# Patient Record
Sex: Female | Born: 1940 | Race: White | Hispanic: No | Smoking: Former smoker
Health system: Southern US, Community
[De-identification: ages and names within clinical notes are randomized; demographics above are authoritative.]

## PROBLEM LIST (undated history)

## (undated) DIAGNOSIS — K589 Irritable bowel syndrome without diarrhea: Secondary | ICD-10-CM

## (undated) DIAGNOSIS — M4722 Other spondylosis with radiculopathy, cervical region: Secondary | ICD-10-CM

## (undated) DIAGNOSIS — K649 Unspecified hemorrhoids: Secondary | ICD-10-CM

## (undated) DIAGNOSIS — A498 Other bacterial infections of unspecified site: Secondary | ICD-10-CM

## (undated) DIAGNOSIS — K5790 Diverticulosis of intestine, part unspecified, without perforation or abscess without bleeding: Secondary | ICD-10-CM

## (undated) DIAGNOSIS — K4020 Bilateral inguinal hernia, without obstruction or gangrene, not specified as recurrent: Secondary | ICD-10-CM

## (undated) DIAGNOSIS — S060XAA Concussion with loss of consciousness status unknown, initial encounter: Secondary | ICD-10-CM

## (undated) DIAGNOSIS — N2581 Secondary hyperparathyroidism of renal origin: Secondary | ICD-10-CM

## (undated) DIAGNOSIS — R053 Chronic cough: Secondary | ICD-10-CM

## (undated) DIAGNOSIS — Z8619 Personal history of other infectious and parasitic diseases: Secondary | ICD-10-CM

## (undated) DIAGNOSIS — I77810 Thoracic aortic ectasia: Secondary | ICD-10-CM

## (undated) DIAGNOSIS — E861 Hypovolemia: Secondary | ICD-10-CM

## (undated) DIAGNOSIS — Z860100 Personal history of colon polyps, unspecified: Secondary | ICD-10-CM

## (undated) DIAGNOSIS — I700 Atherosclerosis of aorta: Secondary | ICD-10-CM

## (undated) DIAGNOSIS — F329 Major depressive disorder, single episode, unspecified: Secondary | ICD-10-CM

## (undated) DIAGNOSIS — M1990 Unspecified osteoarthritis, unspecified site: Secondary | ICD-10-CM

## (undated) DIAGNOSIS — R0602 Shortness of breath: Secondary | ICD-10-CM

## (undated) DIAGNOSIS — L659 Nonscarring hair loss, unspecified: Secondary | ICD-10-CM

## (undated) DIAGNOSIS — R202 Paresthesia of skin: Secondary | ICD-10-CM

## (undated) DIAGNOSIS — J189 Pneumonia, unspecified organism: Secondary | ICD-10-CM

## (undated) DIAGNOSIS — F32A Depression, unspecified: Secondary | ICD-10-CM

## (undated) DIAGNOSIS — D509 Iron deficiency anemia, unspecified: Secondary | ICD-10-CM

## (undated) DIAGNOSIS — M8580 Other specified disorders of bone density and structure, unspecified site: Secondary | ICD-10-CM

## (undated) DIAGNOSIS — K635 Polyp of colon: Secondary | ICD-10-CM

## (undated) DIAGNOSIS — G5603 Carpal tunnel syndrome, bilateral upper limbs: Principal | ICD-10-CM

## (undated) DIAGNOSIS — I4891 Unspecified atrial fibrillation: Secondary | ICD-10-CM

## (undated) DIAGNOSIS — N183 Chronic kidney disease, stage 3 unspecified: Secondary | ICD-10-CM

## (undated) DIAGNOSIS — R05 Cough: Secondary | ICD-10-CM

## (undated) DIAGNOSIS — I719 Aortic aneurysm of unspecified site, without rupture: Secondary | ICD-10-CM

## (undated) DIAGNOSIS — E7800 Pure hypercholesterolemia, unspecified: Secondary | ICD-10-CM

## (undated) DIAGNOSIS — Z9841 Cataract extraction status, right eye: Secondary | ICD-10-CM

## (undated) DIAGNOSIS — I2510 Atherosclerotic heart disease of native coronary artery without angina pectoris: Secondary | ICD-10-CM

## (undated) DIAGNOSIS — I779 Disorder of arteries and arterioles, unspecified: Secondary | ICD-10-CM

## (undated) DIAGNOSIS — K219 Gastro-esophageal reflux disease without esophagitis: Secondary | ICD-10-CM

## (undated) DIAGNOSIS — J40 Bronchitis, not specified as acute or chronic: Secondary | ICD-10-CM

## (undated) DIAGNOSIS — I10 Essential (primary) hypertension: Secondary | ICD-10-CM

## (undated) DIAGNOSIS — Q2112 Patent foramen ovale: Secondary | ICD-10-CM

## (undated) DIAGNOSIS — H409 Unspecified glaucoma: Secondary | ICD-10-CM

## (undated) DIAGNOSIS — R32 Unspecified urinary incontinence: Secondary | ICD-10-CM

## (undated) DIAGNOSIS — G709 Myoneural disorder, unspecified: Secondary | ICD-10-CM

## (undated) DIAGNOSIS — D126 Benign neoplasm of colon, unspecified: Secondary | ICD-10-CM

## (undated) DIAGNOSIS — S060X9A Concussion with loss of consciousness of unspecified duration, initial encounter: Secondary | ICD-10-CM

## (undated) DIAGNOSIS — M109 Gout, unspecified: Secondary | ICD-10-CM

## (undated) DIAGNOSIS — G4730 Sleep apnea, unspecified: Secondary | ICD-10-CM

## (undated) DIAGNOSIS — Z7901 Long term (current) use of anticoagulants: Secondary | ICD-10-CM

## (undated) DIAGNOSIS — R002 Palpitations: Secondary | ICD-10-CM

## (undated) DIAGNOSIS — D649 Anemia, unspecified: Secondary | ICD-10-CM

## (undated) HISTORY — PX: COSMETIC SURGERY: SHX468

## (undated) HISTORY — DX: Diverticulosis of intestine, part unspecified, without perforation or abscess without bleeding: K57.90

## (undated) HISTORY — DX: Essential (primary) hypertension: I10

## (undated) HISTORY — PX: ROTATOR CUFF REPAIR: SHX139

## (undated) HISTORY — DX: Polyp of colon: K63.5

## (undated) HISTORY — PX: BACK SURGERY: SHX140

## (undated) HISTORY — DX: Pure hypercholesterolemia, unspecified: E78.00

## (undated) HISTORY — PX: SPINE SURGERY: SHX786

## (undated) HISTORY — PX: TOTAL KNEE ARTHROPLASTY: SHX125

## (undated) HISTORY — PX: BREAST REDUCTION SURGERY: SHX8

## (undated) HISTORY — PX: TONSILLECTOMY: SUR1361

## (undated) HISTORY — PX: EYE SURGERY: SHX253

## (undated) HISTORY — PX: LAMINECTOMY: SHX219

## (undated) HISTORY — PX: COLONOSCOPY: SHX174

## (undated) HISTORY — DX: Carpal tunnel syndrome, bilateral upper limbs: G56.03

## (undated) HISTORY — PX: JOINT REPLACEMENT: SHX530

## (undated) HISTORY — DX: Unspecified osteoarthritis, unspecified site: M19.90

## (undated) HISTORY — DX: Unspecified urinary incontinence: R32

## (undated) HISTORY — DX: Major depressive disorder, single episode, unspecified: F32.9

## (undated) HISTORY — DX: Depression, unspecified: F32.A

## (undated) HISTORY — DX: Hypovolemia: E86.1

## (undated) HISTORY — PX: OTHER SURGICAL HISTORY: SHX169

---

## 1977-09-07 HISTORY — PX: ABDOMINAL HYSTERECTOMY: SHX81

## 1979-09-08 HISTORY — PX: BLADDER SURGERY: SHX569

## 1994-09-07 HISTORY — PX: CHOLECYSTECTOMY: SHX55

## 1997-03-07 HISTORY — PX: SPINAL CORD STIMULATOR IMPLANT: SHX2422

## 1999-09-08 HISTORY — PX: REDUCTION MAMMAPLASTY: SUR839

## 2003-09-08 HISTORY — PX: FOOT SURGERY: SHX648

## 2004-09-12 ENCOUNTER — Ambulatory Visit: Payer: Self-pay | Admitting: Internal Medicine

## 2005-01-07 ENCOUNTER — Ambulatory Visit: Payer: Self-pay | Admitting: Internal Medicine

## 2005-03-27 ENCOUNTER — Ambulatory Visit: Payer: Self-pay | Admitting: Unknown Physician Specialty

## 2005-06-12 ENCOUNTER — Ambulatory Visit: Payer: Self-pay | Admitting: Unknown Physician Specialty

## 2005-09-07 HISTORY — PX: SPINAL CORD STIMULATOR REMOVAL: SHX2423

## 2005-09-07 HISTORY — PX: CARPAL TUNNEL RELEASE: SHX101

## 2005-11-04 ENCOUNTER — Ambulatory Visit: Payer: Self-pay | Admitting: Pain Medicine

## 2005-11-18 ENCOUNTER — Ambulatory Visit: Payer: Self-pay | Admitting: Orthopaedic Surgery

## 2005-11-23 ENCOUNTER — Ambulatory Visit: Payer: Self-pay | Admitting: Pain Medicine

## 2006-01-05 ENCOUNTER — Ambulatory Visit: Payer: Self-pay | Admitting: Physician Assistant

## 2006-01-08 ENCOUNTER — Ambulatory Visit: Payer: Self-pay | Admitting: Internal Medicine

## 2006-01-12 ENCOUNTER — Ambulatory Visit: Payer: Self-pay | Admitting: Pain Medicine

## 2006-01-25 ENCOUNTER — Ambulatory Visit: Payer: Self-pay | Admitting: Pain Medicine

## 2006-02-04 ENCOUNTER — Ambulatory Visit: Payer: Self-pay | Admitting: Pain Medicine

## 2006-02-15 ENCOUNTER — Ambulatory Visit: Payer: Self-pay | Admitting: Pain Medicine

## 2006-07-08 ENCOUNTER — Ambulatory Visit: Payer: Self-pay | Admitting: Internal Medicine

## 2006-09-03 ENCOUNTER — Ambulatory Visit: Payer: Self-pay | Admitting: Internal Medicine

## 2006-12-31 ENCOUNTER — Ambulatory Visit: Payer: Self-pay | Admitting: Orthopaedic Surgery

## 2007-01-06 ENCOUNTER — Ambulatory Visit: Payer: Self-pay | Admitting: Orthopaedic Surgery

## 2007-01-28 ENCOUNTER — Ambulatory Visit: Payer: Self-pay | Admitting: Internal Medicine

## 2007-04-18 ENCOUNTER — Ambulatory Visit: Payer: Self-pay | Admitting: Urology

## 2007-06-09 ENCOUNTER — Emergency Department: Payer: Self-pay | Admitting: Emergency Medicine

## 2007-06-09 ENCOUNTER — Other Ambulatory Visit: Payer: Self-pay

## 2008-01-31 ENCOUNTER — Ambulatory Visit: Payer: Self-pay | Admitting: Internal Medicine

## 2008-08-09 ENCOUNTER — Ambulatory Visit: Payer: Self-pay | Admitting: Orthopedic Surgery

## 2008-08-16 ENCOUNTER — Ambulatory Visit: Payer: Self-pay | Admitting: Internal Medicine

## 2009-02-01 ENCOUNTER — Ambulatory Visit: Payer: Self-pay | Admitting: Internal Medicine

## 2009-02-25 ENCOUNTER — Ambulatory Visit: Payer: Self-pay | Admitting: Orthopedic Surgery

## 2009-02-28 ENCOUNTER — Ambulatory Visit: Payer: Self-pay | Admitting: Orthopedic Surgery

## 2009-04-04 ENCOUNTER — Ambulatory Visit: Payer: Self-pay | Admitting: Unknown Physician Specialty

## 2009-08-07 DIAGNOSIS — E861 Hypovolemia: Secondary | ICD-10-CM

## 2009-08-07 HISTORY — DX: Hypovolemia: E86.1

## 2009-08-24 ENCOUNTER — Inpatient Hospital Stay: Payer: Self-pay | Admitting: Internal Medicine

## 2010-01-05 ENCOUNTER — Ambulatory Visit: Payer: Self-pay | Admitting: Internal Medicine

## 2010-01-31 ENCOUNTER — Inpatient Hospital Stay: Payer: Self-pay | Admitting: Internal Medicine

## 2010-02-05 ENCOUNTER — Ambulatory Visit: Payer: Self-pay | Admitting: Internal Medicine

## 2010-02-18 ENCOUNTER — Ambulatory Visit: Payer: Self-pay | Admitting: Internal Medicine

## 2010-03-05 ENCOUNTER — Ambulatory Visit: Payer: Self-pay | Admitting: Internal Medicine

## 2010-03-07 ENCOUNTER — Ambulatory Visit: Payer: Self-pay | Admitting: Internal Medicine

## 2010-05-08 ENCOUNTER — Ambulatory Visit: Payer: Self-pay | Admitting: Internal Medicine

## 2010-05-16 ENCOUNTER — Ambulatory Visit: Payer: Self-pay | Admitting: Unknown Physician Specialty

## 2010-05-20 ENCOUNTER — Ambulatory Visit: Payer: Self-pay | Admitting: Internal Medicine

## 2010-05-27 ENCOUNTER — Ambulatory Visit: Payer: Self-pay | Admitting: Unknown Physician Specialty

## 2010-06-07 ENCOUNTER — Ambulatory Visit: Payer: Self-pay | Admitting: Internal Medicine

## 2010-07-24 ENCOUNTER — Ambulatory Visit: Payer: Self-pay | Admitting: Internal Medicine

## 2010-12-17 ENCOUNTER — Ambulatory Visit: Payer: Self-pay | Admitting: Internal Medicine

## 2011-01-06 ENCOUNTER — Ambulatory Visit: Payer: Self-pay | Admitting: Internal Medicine

## 2011-02-11 ENCOUNTER — Ambulatory Visit: Payer: Self-pay | Admitting: Internal Medicine

## 2011-04-29 ENCOUNTER — Ambulatory Visit: Payer: Self-pay | Admitting: Unknown Physician Specialty

## 2011-05-04 LAB — PATHOLOGY REPORT

## 2011-05-22 ENCOUNTER — Ambulatory Visit: Payer: Self-pay | Admitting: Internal Medicine

## 2011-08-17 ENCOUNTER — Ambulatory Visit: Payer: Self-pay | Admitting: Orthopedic Surgery

## 2011-09-28 ENCOUNTER — Ambulatory Visit: Payer: Self-pay | Admitting: Orthopedic Surgery

## 2011-09-28 DIAGNOSIS — Z9889 Other specified postprocedural states: Secondary | ICD-10-CM | POA: Diagnosis not present

## 2011-09-28 DIAGNOSIS — Z90710 Acquired absence of both cervix and uterus: Secondary | ICD-10-CM | POA: Diagnosis not present

## 2011-09-28 DIAGNOSIS — M1710 Unilateral primary osteoarthritis, unspecified knee: Secondary | ICD-10-CM | POA: Diagnosis not present

## 2011-09-28 DIAGNOSIS — F329 Major depressive disorder, single episode, unspecified: Secondary | ICD-10-CM | POA: Diagnosis not present

## 2011-09-28 DIAGNOSIS — E785 Hyperlipidemia, unspecified: Secondary | ICD-10-CM | POA: Diagnosis not present

## 2011-09-28 DIAGNOSIS — Z79899 Other long term (current) drug therapy: Secondary | ICD-10-CM | POA: Diagnosis not present

## 2011-09-28 DIAGNOSIS — Z01812 Encounter for preprocedural laboratory examination: Secondary | ICD-10-CM | POA: Diagnosis not present

## 2011-09-28 DIAGNOSIS — I10 Essential (primary) hypertension: Secondary | ICD-10-CM | POA: Diagnosis not present

## 2011-09-28 LAB — SEDIMENTATION RATE: Erythrocyte Sed Rate: 22 mm/hr (ref 0–30)

## 2011-09-28 LAB — BASIC METABOLIC PANEL
Creatinine: 0.73 mg/dL (ref 0.60–1.30)
EGFR (African American): >60
EGFR (Non-African Amer.): >60
Glucose: 97 mg/dL (ref 65–99)
Osmolality: 286 (ref 275–301)
Potassium: 4.5 mmol/L (ref 3.5–5.1)
Sodium: 143 mmol/L (ref 136–145)

## 2011-09-28 LAB — APTT: Activated PTT: 34.4 secs (ref 23.6–35.9)

## 2011-09-28 LAB — CBC
HGB: 12 g/dL (ref 12.0–16.0)
MCH: 33 pg (ref 26.0–34.0)
MCHC: 33.8 g/dL (ref 32.0–36.0)
MCV: 98 fL (ref 80–100)
Platelet: 261 10*3/uL (ref 150–440)

## 2011-09-28 LAB — PROTIME-INR
INR: 0.9
Prothrombin Time: 13 secs (ref 11.5–14.7)

## 2011-10-08 ENCOUNTER — Inpatient Hospital Stay: Payer: Self-pay | Admitting: Orthopedic Surgery

## 2011-10-08 DIAGNOSIS — F329 Major depressive disorder, single episode, unspecified: Secondary | ICD-10-CM | POA: Diagnosis not present

## 2011-10-08 DIAGNOSIS — Z90710 Acquired absence of both cervix and uterus: Secondary | ICD-10-CM | POA: Diagnosis not present

## 2011-10-08 DIAGNOSIS — M810 Age-related osteoporosis without current pathological fracture: Secondary | ICD-10-CM | POA: Diagnosis not present

## 2011-10-08 DIAGNOSIS — R6889 Other general symptoms and signs: Secondary | ICD-10-CM | POA: Diagnosis not present

## 2011-10-08 DIAGNOSIS — Z471 Aftercare following joint replacement surgery: Secondary | ICD-10-CM | POA: Diagnosis not present

## 2011-10-08 DIAGNOSIS — M25569 Pain in unspecified knee: Secondary | ICD-10-CM | POA: Diagnosis not present

## 2011-10-08 DIAGNOSIS — Z87891 Personal history of nicotine dependence: Secondary | ICD-10-CM | POA: Diagnosis not present

## 2011-10-08 DIAGNOSIS — I10 Essential (primary) hypertension: Secondary | ICD-10-CM | POA: Diagnosis not present

## 2011-10-08 DIAGNOSIS — Z5189 Encounter for other specified aftercare: Secondary | ICD-10-CM | POA: Diagnosis not present

## 2011-10-08 DIAGNOSIS — Z882 Allergy status to sulfonamides status: Secondary | ICD-10-CM | POA: Diagnosis not present

## 2011-10-08 DIAGNOSIS — Z823 Family history of stroke: Secondary | ICD-10-CM | POA: Diagnosis not present

## 2011-10-08 DIAGNOSIS — K649 Unspecified hemorrhoids: Secondary | ICD-10-CM | POA: Diagnosis present

## 2011-10-08 DIAGNOSIS — Z881 Allergy status to other antibiotic agents status: Secondary | ICD-10-CM | POA: Diagnosis not present

## 2011-10-08 DIAGNOSIS — Z96659 Presence of unspecified artificial knee joint: Secondary | ICD-10-CM | POA: Diagnosis not present

## 2011-10-08 DIAGNOSIS — E7800 Pure hypercholesterolemia, unspecified: Secondary | ICD-10-CM | POA: Diagnosis not present

## 2011-10-08 DIAGNOSIS — M1990 Unspecified osteoarthritis, unspecified site: Secondary | ICD-10-CM | POA: Diagnosis not present

## 2011-10-08 DIAGNOSIS — Z8249 Family history of ischemic heart disease and other diseases of the circulatory system: Secondary | ICD-10-CM | POA: Diagnosis not present

## 2011-10-08 DIAGNOSIS — G8918 Other acute postprocedural pain: Secondary | ICD-10-CM | POA: Diagnosis not present

## 2011-10-08 DIAGNOSIS — R269 Unspecified abnormalities of gait and mobility: Secondary | ICD-10-CM | POA: Diagnosis not present

## 2011-10-08 DIAGNOSIS — M109 Gout, unspecified: Secondary | ICD-10-CM | POA: Diagnosis not present

## 2011-10-08 DIAGNOSIS — M1710 Unilateral primary osteoarthritis, unspecified knee: Secondary | ICD-10-CM | POA: Diagnosis present

## 2011-10-08 DIAGNOSIS — M6281 Muscle weakness (generalized): Secondary | ICD-10-CM | POA: Diagnosis not present

## 2011-10-08 DIAGNOSIS — Z9089 Acquired absence of other organs: Secondary | ICD-10-CM | POA: Diagnosis not present

## 2011-10-08 DIAGNOSIS — Z79899 Other long term (current) drug therapy: Secondary | ICD-10-CM | POA: Diagnosis not present

## 2011-10-09 LAB — BASIC METABOLIC PANEL
Anion Gap: 10 (ref 7–16)
Calcium, Total: 8.4 mg/dL — ABNORMAL LOW (ref 8.5–10.1)
Co2: 28 mmol/L (ref 21–32)
EGFR (African American): >60
Glucose: 103 mg/dL — ABNORMAL HIGH (ref 65–99)
Osmolality: 278 (ref 275–301)
Sodium: 139 mmol/L (ref 136–145)

## 2011-10-09 LAB — HEMOGLOBIN: HGB: 9.6 g/dL — ABNORMAL LOW (ref 12.0–16.0)

## 2011-10-10 LAB — HEMOGLOBIN: HGB: 9 g/dL — ABNORMAL LOW (ref 12.0–16.0)

## 2011-10-12 DIAGNOSIS — Z96659 Presence of unspecified artificial knee joint: Secondary | ICD-10-CM | POA: Diagnosis not present

## 2011-10-12 DIAGNOSIS — M109 Gout, unspecified: Secondary | ICD-10-CM | POA: Diagnosis not present

## 2011-10-12 DIAGNOSIS — F329 Major depressive disorder, single episode, unspecified: Secondary | ICD-10-CM | POA: Diagnosis not present

## 2011-10-12 DIAGNOSIS — M6281 Muscle weakness (generalized): Secondary | ICD-10-CM | POA: Diagnosis not present

## 2011-10-12 DIAGNOSIS — Z5189 Encounter for other specified aftercare: Secondary | ICD-10-CM | POA: Diagnosis not present

## 2011-10-12 DIAGNOSIS — M810 Age-related osteoporosis without current pathological fracture: Secondary | ICD-10-CM | POA: Diagnosis not present

## 2011-10-12 DIAGNOSIS — Z471 Aftercare following joint replacement surgery: Secondary | ICD-10-CM | POA: Diagnosis not present

## 2011-10-12 DIAGNOSIS — E7800 Pure hypercholesterolemia, unspecified: Secondary | ICD-10-CM | POA: Diagnosis not present

## 2011-10-12 DIAGNOSIS — R269 Unspecified abnormalities of gait and mobility: Secondary | ICD-10-CM | POA: Diagnosis not present

## 2011-10-12 DIAGNOSIS — I10 Essential (primary) hypertension: Secondary | ICD-10-CM | POA: Diagnosis not present

## 2011-10-12 DIAGNOSIS — R6889 Other general symptoms and signs: Secondary | ICD-10-CM | POA: Diagnosis not present

## 2011-10-13 ENCOUNTER — Encounter: Payer: Self-pay | Admitting: Internal Medicine

## 2011-10-29 DIAGNOSIS — Z471 Aftercare following joint replacement surgery: Secondary | ICD-10-CM | POA: Diagnosis not present

## 2011-10-29 DIAGNOSIS — Z96659 Presence of unspecified artificial knee joint: Secondary | ICD-10-CM | POA: Diagnosis not present

## 2011-10-29 DIAGNOSIS — I10 Essential (primary) hypertension: Secondary | ICD-10-CM | POA: Diagnosis not present

## 2011-10-29 DIAGNOSIS — Z5189 Encounter for other specified aftercare: Secondary | ICD-10-CM | POA: Diagnosis not present

## 2011-10-29 DIAGNOSIS — F3289 Other specified depressive episodes: Secondary | ICD-10-CM | POA: Diagnosis not present

## 2011-11-06 ENCOUNTER — Encounter: Payer: Self-pay | Admitting: Internal Medicine

## 2011-11-08 DIAGNOSIS — I10 Essential (primary) hypertension: Secondary | ICD-10-CM | POA: Diagnosis not present

## 2011-11-08 DIAGNOSIS — Z5189 Encounter for other specified aftercare: Secondary | ICD-10-CM | POA: Diagnosis not present

## 2011-11-18 DIAGNOSIS — Z96659 Presence of unspecified artificial knee joint: Secondary | ICD-10-CM | POA: Diagnosis not present

## 2011-11-20 DIAGNOSIS — I10 Essential (primary) hypertension: Secondary | ICD-10-CM | POA: Diagnosis not present

## 2011-11-20 DIAGNOSIS — E7800 Pure hypercholesterolemia, unspecified: Secondary | ICD-10-CM | POA: Diagnosis not present

## 2011-11-20 DIAGNOSIS — Z79899 Other long term (current) drug therapy: Secondary | ICD-10-CM | POA: Diagnosis not present

## 2011-11-23 DIAGNOSIS — M25669 Stiffness of unspecified knee, not elsewhere classified: Secondary | ICD-10-CM | POA: Diagnosis not present

## 2011-11-23 DIAGNOSIS — M6281 Muscle weakness (generalized): Secondary | ICD-10-CM | POA: Diagnosis not present

## 2011-11-23 DIAGNOSIS — Z96659 Presence of unspecified artificial knee joint: Secondary | ICD-10-CM | POA: Diagnosis not present

## 2011-11-23 DIAGNOSIS — M25569 Pain in unspecified knee: Secondary | ICD-10-CM | POA: Diagnosis not present

## 2011-11-24 DIAGNOSIS — M25569 Pain in unspecified knee: Secondary | ICD-10-CM | POA: Diagnosis not present

## 2011-11-24 DIAGNOSIS — M25669 Stiffness of unspecified knee, not elsewhere classified: Secondary | ICD-10-CM | POA: Diagnosis not present

## 2011-11-24 DIAGNOSIS — M6281 Muscle weakness (generalized): Secondary | ICD-10-CM | POA: Diagnosis not present

## 2011-11-24 DIAGNOSIS — Z96659 Presence of unspecified artificial knee joint: Secondary | ICD-10-CM | POA: Diagnosis not present

## 2011-11-27 DIAGNOSIS — Z96659 Presence of unspecified artificial knee joint: Secondary | ICD-10-CM | POA: Diagnosis not present

## 2011-11-27 DIAGNOSIS — M25569 Pain in unspecified knee: Secondary | ICD-10-CM | POA: Diagnosis not present

## 2011-11-27 DIAGNOSIS — M25669 Stiffness of unspecified knee, not elsewhere classified: Secondary | ICD-10-CM | POA: Diagnosis not present

## 2011-11-27 DIAGNOSIS — M6281 Muscle weakness (generalized): Secondary | ICD-10-CM | POA: Diagnosis not present

## 2011-11-30 DIAGNOSIS — M6281 Muscle weakness (generalized): Secondary | ICD-10-CM | POA: Diagnosis not present

## 2011-11-30 DIAGNOSIS — M25669 Stiffness of unspecified knee, not elsewhere classified: Secondary | ICD-10-CM | POA: Diagnosis not present

## 2011-11-30 DIAGNOSIS — M25569 Pain in unspecified knee: Secondary | ICD-10-CM | POA: Diagnosis not present

## 2011-11-30 DIAGNOSIS — Z96659 Presence of unspecified artificial knee joint: Secondary | ICD-10-CM | POA: Diagnosis not present

## 2011-12-01 DIAGNOSIS — E7800 Pure hypercholesterolemia, unspecified: Secondary | ICD-10-CM | POA: Diagnosis not present

## 2011-12-01 DIAGNOSIS — I10 Essential (primary) hypertension: Secondary | ICD-10-CM | POA: Diagnosis not present

## 2011-12-02 DIAGNOSIS — M25569 Pain in unspecified knee: Secondary | ICD-10-CM | POA: Diagnosis not present

## 2011-12-02 DIAGNOSIS — Z96659 Presence of unspecified artificial knee joint: Secondary | ICD-10-CM | POA: Diagnosis not present

## 2011-12-02 DIAGNOSIS — M6281 Muscle weakness (generalized): Secondary | ICD-10-CM | POA: Diagnosis not present

## 2011-12-02 DIAGNOSIS — M25669 Stiffness of unspecified knee, not elsewhere classified: Secondary | ICD-10-CM | POA: Diagnosis not present

## 2011-12-03 DIAGNOSIS — M25569 Pain in unspecified knee: Secondary | ICD-10-CM | POA: Diagnosis not present

## 2011-12-03 DIAGNOSIS — Z96659 Presence of unspecified artificial knee joint: Secondary | ICD-10-CM | POA: Diagnosis not present

## 2011-12-03 DIAGNOSIS — M25669 Stiffness of unspecified knee, not elsewhere classified: Secondary | ICD-10-CM | POA: Diagnosis not present

## 2011-12-03 DIAGNOSIS — M6281 Muscle weakness (generalized): Secondary | ICD-10-CM | POA: Diagnosis not present

## 2011-12-07 ENCOUNTER — Encounter: Payer: Self-pay | Admitting: Internal Medicine

## 2011-12-14 DIAGNOSIS — M25669 Stiffness of unspecified knee, not elsewhere classified: Secondary | ICD-10-CM | POA: Diagnosis not present

## 2011-12-14 DIAGNOSIS — M25569 Pain in unspecified knee: Secondary | ICD-10-CM | POA: Diagnosis not present

## 2011-12-14 DIAGNOSIS — M6281 Muscle weakness (generalized): Secondary | ICD-10-CM | POA: Diagnosis not present

## 2011-12-14 DIAGNOSIS — Z96659 Presence of unspecified artificial knee joint: Secondary | ICD-10-CM | POA: Diagnosis not present

## 2011-12-16 DIAGNOSIS — M25569 Pain in unspecified knee: Secondary | ICD-10-CM | POA: Diagnosis not present

## 2011-12-16 DIAGNOSIS — M6281 Muscle weakness (generalized): Secondary | ICD-10-CM | POA: Diagnosis not present

## 2011-12-16 DIAGNOSIS — Z96659 Presence of unspecified artificial knee joint: Secondary | ICD-10-CM | POA: Diagnosis not present

## 2011-12-16 DIAGNOSIS — M25669 Stiffness of unspecified knee, not elsewhere classified: Secondary | ICD-10-CM | POA: Diagnosis not present

## 2011-12-18 DIAGNOSIS — M25569 Pain in unspecified knee: Secondary | ICD-10-CM | POA: Diagnosis not present

## 2011-12-18 DIAGNOSIS — M6281 Muscle weakness (generalized): Secondary | ICD-10-CM | POA: Diagnosis not present

## 2011-12-18 DIAGNOSIS — Z96659 Presence of unspecified artificial knee joint: Secondary | ICD-10-CM | POA: Diagnosis not present

## 2011-12-18 DIAGNOSIS — M25669 Stiffness of unspecified knee, not elsewhere classified: Secondary | ICD-10-CM | POA: Diagnosis not present

## 2011-12-21 DIAGNOSIS — M25569 Pain in unspecified knee: Secondary | ICD-10-CM | POA: Diagnosis not present

## 2011-12-21 DIAGNOSIS — M6281 Muscle weakness (generalized): Secondary | ICD-10-CM | POA: Diagnosis not present

## 2011-12-21 DIAGNOSIS — M25669 Stiffness of unspecified knee, not elsewhere classified: Secondary | ICD-10-CM | POA: Diagnosis not present

## 2011-12-21 DIAGNOSIS — Z96659 Presence of unspecified artificial knee joint: Secondary | ICD-10-CM | POA: Diagnosis not present

## 2011-12-23 DIAGNOSIS — M6281 Muscle weakness (generalized): Secondary | ICD-10-CM | POA: Diagnosis not present

## 2011-12-23 DIAGNOSIS — M25669 Stiffness of unspecified knee, not elsewhere classified: Secondary | ICD-10-CM | POA: Diagnosis not present

## 2011-12-23 DIAGNOSIS — Z96659 Presence of unspecified artificial knee joint: Secondary | ICD-10-CM | POA: Diagnosis not present

## 2011-12-23 DIAGNOSIS — M25569 Pain in unspecified knee: Secondary | ICD-10-CM | POA: Diagnosis not present

## 2011-12-28 DIAGNOSIS — M25669 Stiffness of unspecified knee, not elsewhere classified: Secondary | ICD-10-CM | POA: Diagnosis not present

## 2011-12-28 DIAGNOSIS — M25569 Pain in unspecified knee: Secondary | ICD-10-CM | POA: Diagnosis not present

## 2011-12-28 DIAGNOSIS — Z96659 Presence of unspecified artificial knee joint: Secondary | ICD-10-CM | POA: Diagnosis not present

## 2011-12-30 DIAGNOSIS — M6281 Muscle weakness (generalized): Secondary | ICD-10-CM | POA: Diagnosis not present

## 2011-12-30 DIAGNOSIS — M25569 Pain in unspecified knee: Secondary | ICD-10-CM | POA: Diagnosis not present

## 2011-12-30 DIAGNOSIS — Z96659 Presence of unspecified artificial knee joint: Secondary | ICD-10-CM | POA: Diagnosis not present

## 2011-12-30 DIAGNOSIS — M25669 Stiffness of unspecified knee, not elsewhere classified: Secondary | ICD-10-CM | POA: Diagnosis not present

## 2012-01-01 DIAGNOSIS — M25569 Pain in unspecified knee: Secondary | ICD-10-CM | POA: Diagnosis not present

## 2012-01-01 DIAGNOSIS — Z96659 Presence of unspecified artificial knee joint: Secondary | ICD-10-CM | POA: Diagnosis not present

## 2012-01-01 DIAGNOSIS — M25669 Stiffness of unspecified knee, not elsewhere classified: Secondary | ICD-10-CM | POA: Diagnosis not present

## 2012-01-01 DIAGNOSIS — M6281 Muscle weakness (generalized): Secondary | ICD-10-CM | POA: Diagnosis not present

## 2012-01-04 DIAGNOSIS — M6281 Muscle weakness (generalized): Secondary | ICD-10-CM | POA: Diagnosis not present

## 2012-01-04 DIAGNOSIS — M25669 Stiffness of unspecified knee, not elsewhere classified: Secondary | ICD-10-CM | POA: Diagnosis not present

## 2012-01-04 DIAGNOSIS — M25569 Pain in unspecified knee: Secondary | ICD-10-CM | POA: Diagnosis not present

## 2012-01-04 DIAGNOSIS — Z96659 Presence of unspecified artificial knee joint: Secondary | ICD-10-CM | POA: Diagnosis not present

## 2012-01-06 ENCOUNTER — Encounter: Payer: Self-pay | Admitting: Internal Medicine

## 2012-01-13 DIAGNOSIS — M25669 Stiffness of unspecified knee, not elsewhere classified: Secondary | ICD-10-CM | POA: Diagnosis not present

## 2012-01-13 DIAGNOSIS — M25569 Pain in unspecified knee: Secondary | ICD-10-CM | POA: Diagnosis not present

## 2012-01-13 DIAGNOSIS — Z96659 Presence of unspecified artificial knee joint: Secondary | ICD-10-CM | POA: Diagnosis not present

## 2012-01-13 DIAGNOSIS — M6281 Muscle weakness (generalized): Secondary | ICD-10-CM | POA: Diagnosis not present

## 2012-01-19 DIAGNOSIS — M25669 Stiffness of unspecified knee, not elsewhere classified: Secondary | ICD-10-CM | POA: Diagnosis not present

## 2012-01-19 DIAGNOSIS — Z96659 Presence of unspecified artificial knee joint: Secondary | ICD-10-CM | POA: Diagnosis not present

## 2012-01-19 DIAGNOSIS — M25569 Pain in unspecified knee: Secondary | ICD-10-CM | POA: Diagnosis not present

## 2012-01-19 DIAGNOSIS — M6281 Muscle weakness (generalized): Secondary | ICD-10-CM | POA: Diagnosis not present

## 2012-01-21 DIAGNOSIS — Z96659 Presence of unspecified artificial knee joint: Secondary | ICD-10-CM | POA: Diagnosis not present

## 2012-01-21 DIAGNOSIS — M6281 Muscle weakness (generalized): Secondary | ICD-10-CM | POA: Diagnosis not present

## 2012-01-21 DIAGNOSIS — M25669 Stiffness of unspecified knee, not elsewhere classified: Secondary | ICD-10-CM | POA: Diagnosis not present

## 2012-01-21 DIAGNOSIS — M25569 Pain in unspecified knee: Secondary | ICD-10-CM | POA: Diagnosis not present

## 2012-03-15 ENCOUNTER — Ambulatory Visit: Payer: Self-pay | Admitting: Dermatology

## 2012-03-15 DIAGNOSIS — M7989 Other specified soft tissue disorders: Secondary | ICD-10-CM | POA: Diagnosis not present

## 2012-03-17 DIAGNOSIS — L0390 Cellulitis, unspecified: Secondary | ICD-10-CM | POA: Diagnosis not present

## 2012-03-17 DIAGNOSIS — R21 Rash and other nonspecific skin eruption: Secondary | ICD-10-CM | POA: Diagnosis not present

## 2012-03-23 DIAGNOSIS — M1710 Unilateral primary osteoarthritis, unspecified knee: Secondary | ICD-10-CM | POA: Diagnosis not present

## 2012-03-23 DIAGNOSIS — Z96659 Presence of unspecified artificial knee joint: Secondary | ICD-10-CM | POA: Diagnosis not present

## 2012-03-23 DIAGNOSIS — S93419A Sprain of calcaneofibular ligament of unspecified ankle, initial encounter: Secondary | ICD-10-CM | POA: Diagnosis not present

## 2012-04-04 ENCOUNTER — Ambulatory Visit: Payer: Self-pay | Admitting: Orthopedic Surgery

## 2012-04-04 DIAGNOSIS — E7800 Pure hypercholesterolemia, unspecified: Secondary | ICD-10-CM | POA: Diagnosis not present

## 2012-04-04 DIAGNOSIS — M25569 Pain in unspecified knee: Secondary | ICD-10-CM | POA: Diagnosis not present

## 2012-04-04 DIAGNOSIS — Z79899 Other long term (current) drug therapy: Secondary | ICD-10-CM | POA: Diagnosis not present

## 2012-04-04 DIAGNOSIS — I10 Essential (primary) hypertension: Secondary | ICD-10-CM | POA: Diagnosis not present

## 2012-05-11 ENCOUNTER — Ambulatory Visit: Payer: Self-pay | Admitting: Orthopedic Surgery

## 2012-05-11 DIAGNOSIS — Z01810 Encounter for preprocedural cardiovascular examination: Secondary | ICD-10-CM | POA: Diagnosis not present

## 2012-05-11 DIAGNOSIS — E785 Hyperlipidemia, unspecified: Secondary | ICD-10-CM | POA: Diagnosis not present

## 2012-05-11 DIAGNOSIS — M79609 Pain in unspecified limb: Secondary | ICD-10-CM | POA: Diagnosis not present

## 2012-05-11 DIAGNOSIS — IMO0002 Reserved for concepts with insufficient information to code with codable children: Secondary | ICD-10-CM | POA: Diagnosis not present

## 2012-05-11 DIAGNOSIS — K219 Gastro-esophageal reflux disease without esophagitis: Secondary | ICD-10-CM | POA: Diagnosis not present

## 2012-05-11 DIAGNOSIS — Z01812 Encounter for preprocedural laboratory examination: Secondary | ICD-10-CM | POA: Diagnosis not present

## 2012-05-11 DIAGNOSIS — I10 Essential (primary) hypertension: Secondary | ICD-10-CM | POA: Diagnosis not present

## 2012-05-11 LAB — BASIC METABOLIC PANEL
Calcium, Total: 9.6 mg/dL (ref 8.5–10.1)
Co2: 29 mmol/L (ref 21–32)
Creatinine: 0.79 mg/dL (ref 0.60–1.30)
EGFR (African American): >60
Glucose: 87 mg/dL (ref 65–99)
Potassium: 5 mmol/L (ref 3.5–5.1)
Sodium: 138 mmol/L (ref 136–145)

## 2012-05-11 LAB — CBC
MCH: 34.3 pg — ABNORMAL HIGH (ref 26.0–34.0)
Platelet: 277 10*3/uL (ref 150–440)
RBC: 3.42 10*6/uL — ABNORMAL LOW (ref 3.80–5.20)
WBC: 5.8 10*3/uL (ref 3.6–11.0)

## 2012-05-11 LAB — PROTIME-INR: INR: 1

## 2012-05-17 ENCOUNTER — Inpatient Hospital Stay: Payer: Self-pay | Admitting: Orthopedic Surgery

## 2012-05-17 DIAGNOSIS — R918 Other nonspecific abnormal finding of lung field: Secondary | ICD-10-CM | POA: Diagnosis not present

## 2012-05-17 DIAGNOSIS — M6281 Muscle weakness (generalized): Secondary | ICD-10-CM | POA: Diagnosis not present

## 2012-05-17 DIAGNOSIS — J320 Chronic maxillary sinusitis: Secondary | ICD-10-CM | POA: Diagnosis not present

## 2012-05-17 DIAGNOSIS — I10 Essential (primary) hypertension: Secondary | ICD-10-CM | POA: Diagnosis present

## 2012-05-17 DIAGNOSIS — F329 Major depressive disorder, single episode, unspecified: Secondary | ICD-10-CM | POA: Diagnosis present

## 2012-05-17 DIAGNOSIS — Z471 Aftercare following joint replacement surgery: Secondary | ICD-10-CM | POA: Diagnosis not present

## 2012-05-17 DIAGNOSIS — E785 Hyperlipidemia, unspecified: Secondary | ICD-10-CM | POA: Diagnosis present

## 2012-05-17 DIAGNOSIS — R4182 Altered mental status, unspecified: Secondary | ICD-10-CM | POA: Diagnosis not present

## 2012-05-17 DIAGNOSIS — M1710 Unilateral primary osteoarthritis, unspecified knee: Secondary | ICD-10-CM | POA: Diagnosis present

## 2012-05-17 DIAGNOSIS — Z9089 Acquired absence of other organs: Secondary | ICD-10-CM | POA: Diagnosis not present

## 2012-05-17 DIAGNOSIS — Z882 Allergy status to sulfonamides status: Secondary | ICD-10-CM | POA: Diagnosis not present

## 2012-05-17 DIAGNOSIS — F05 Delirium due to known physiological condition: Secondary | ICD-10-CM | POA: Diagnosis not present

## 2012-05-17 DIAGNOSIS — Z01810 Encounter for preprocedural cardiovascular examination: Secondary | ICD-10-CM | POA: Diagnosis not present

## 2012-05-17 DIAGNOSIS — Z90710 Acquired absence of both cervix and uterus: Secondary | ICD-10-CM | POA: Diagnosis not present

## 2012-05-17 DIAGNOSIS — Z96659 Presence of unspecified artificial knee joint: Secondary | ICD-10-CM | POA: Diagnosis not present

## 2012-05-17 DIAGNOSIS — J189 Pneumonia, unspecified organism: Secondary | ICD-10-CM | POA: Diagnosis not present

## 2012-05-17 DIAGNOSIS — R0902 Hypoxemia: Secondary | ICD-10-CM | POA: Diagnosis not present

## 2012-05-17 DIAGNOSIS — R404 Transient alteration of awareness: Secondary | ICD-10-CM | POA: Diagnosis present

## 2012-05-17 DIAGNOSIS — E7800 Pure hypercholesterolemia, unspecified: Secondary | ICD-10-CM | POA: Diagnosis present

## 2012-05-17 DIAGNOSIS — I959 Hypotension, unspecified: Secondary | ICD-10-CM | POA: Diagnosis not present

## 2012-05-17 DIAGNOSIS — T50995A Adverse effect of other drugs, medicaments and biological substances, initial encounter: Secondary | ICD-10-CM | POA: Diagnosis not present

## 2012-05-17 DIAGNOSIS — R269 Unspecified abnormalities of gait and mobility: Secondary | ICD-10-CM | POA: Diagnosis not present

## 2012-05-17 DIAGNOSIS — I9589 Other hypotension: Secondary | ICD-10-CM | POA: Diagnosis not present

## 2012-05-17 DIAGNOSIS — Z87440 Personal history of urinary (tract) infections: Secondary | ICD-10-CM | POA: Diagnosis not present

## 2012-05-17 DIAGNOSIS — M109 Gout, unspecified: Secondary | ICD-10-CM | POA: Diagnosis present

## 2012-05-17 DIAGNOSIS — R6889 Other general symptoms and signs: Secondary | ICD-10-CM | POA: Diagnosis not present

## 2012-05-17 DIAGNOSIS — J9819 Other pulmonary collapse: Secondary | ICD-10-CM | POA: Diagnosis not present

## 2012-05-17 DIAGNOSIS — Z881 Allergy status to other antibiotic agents status: Secondary | ICD-10-CM | POA: Diagnosis not present

## 2012-05-17 DIAGNOSIS — M1909 Primary osteoarthritis, other specified site: Secondary | ICD-10-CM | POA: Diagnosis not present

## 2012-05-17 DIAGNOSIS — D62 Acute posthemorrhagic anemia: Secondary | ICD-10-CM | POA: Diagnosis not present

## 2012-05-17 DIAGNOSIS — IMO0002 Reserved for concepts with insufficient information to code with codable children: Secondary | ICD-10-CM | POA: Diagnosis not present

## 2012-05-17 DIAGNOSIS — Z79899 Other long term (current) drug therapy: Secondary | ICD-10-CM | POA: Diagnosis not present

## 2012-05-17 DIAGNOSIS — Z5189 Encounter for other specified aftercare: Secondary | ICD-10-CM | POA: Diagnosis not present

## 2012-05-18 LAB — HEMOGLOBIN: HGB: 8.8 g/dL — ABNORMAL LOW (ref 12.0–16.0)

## 2012-05-18 LAB — BASIC METABOLIC PANEL
Anion Gap: 9 (ref 7–16)
Creatinine: 0.73 mg/dL (ref 0.60–1.30)
EGFR (African American): >60
Osmolality: 276 (ref 275–301)

## 2012-05-18 LAB — PLATELET COUNT: Platelet: 184 10*3/uL (ref 150–440)

## 2012-05-19 DIAGNOSIS — T50995A Adverse effect of other drugs, medicaments and biological substances, initial encounter: Secondary | ICD-10-CM | POA: Diagnosis not present

## 2012-05-19 DIAGNOSIS — Z01810 Encounter for preprocedural cardiovascular examination: Secondary | ICD-10-CM | POA: Diagnosis not present

## 2012-05-19 DIAGNOSIS — J189 Pneumonia, unspecified organism: Secondary | ICD-10-CM | POA: Diagnosis not present

## 2012-05-19 DIAGNOSIS — Z96659 Presence of unspecified artificial knee joint: Secondary | ICD-10-CM | POA: Diagnosis not present

## 2012-05-19 DIAGNOSIS — R0902 Hypoxemia: Secondary | ICD-10-CM | POA: Diagnosis not present

## 2012-05-19 DIAGNOSIS — R4182 Altered mental status, unspecified: Secondary | ICD-10-CM | POA: Diagnosis not present

## 2012-05-19 DIAGNOSIS — J320 Chronic maxillary sinusitis: Secondary | ICD-10-CM | POA: Diagnosis not present

## 2012-05-19 DIAGNOSIS — I959 Hypotension, unspecified: Secondary | ICD-10-CM | POA: Diagnosis not present

## 2012-05-19 LAB — BASIC METABOLIC PANEL
Anion Gap: 5 — ABNORMAL LOW (ref 7–16)
Calcium, Total: 8.6 mg/dL (ref 8.5–10.1)
Co2: 30 mmol/L (ref 21–32)
EGFR (African American): >60
EGFR (Non-African Amer.): >60
Glucose: 107 mg/dL — ABNORMAL HIGH (ref 65–99)
Osmolality: 273 (ref 275–301)
Potassium: 3.5 mmol/L (ref 3.5–5.1)

## 2012-05-19 LAB — CBC WITH DIFFERENTIAL/PLATELET
Basophil %: 0.2 %
Eosinophil #: 0.1 10*3/uL (ref 0.0–0.7)
Eosinophil %: 1.3 %
HGB: 8.6 g/dL — ABNORMAL LOW (ref 12.0–16.0)
MCHC: 32.7 g/dL (ref 32.0–36.0)
Monocyte #: 0.7 x10 3/mm (ref 0.2–0.9)
Neutrophil #: 6.6 10*3/uL — ABNORMAL HIGH (ref 1.4–6.5)
Neutrophil %: 81.5 %
RBC: 2.63 10*6/uL — ABNORMAL LOW (ref 3.80–5.20)
WBC: 8.1 10*3/uL (ref 3.6–11.0)

## 2012-05-19 LAB — PATHOLOGY REPORT

## 2012-05-20 DIAGNOSIS — I959 Hypotension, unspecified: Secondary | ICD-10-CM | POA: Diagnosis not present

## 2012-05-20 DIAGNOSIS — J9819 Other pulmonary collapse: Secondary | ICD-10-CM | POA: Diagnosis not present

## 2012-05-20 DIAGNOSIS — R0902 Hypoxemia: Secondary | ICD-10-CM | POA: Diagnosis not present

## 2012-05-20 DIAGNOSIS — F05 Delirium due to known physiological condition: Secondary | ICD-10-CM | POA: Diagnosis not present

## 2012-05-20 LAB — URINALYSIS, COMPLETE
Bilirubin,UR: NEGATIVE
Blood: NEGATIVE
Glucose,UR: NEGATIVE mg/dL (ref 0–75)
Hyaline Cast: 5
Leukocyte Esterase: NEGATIVE
Nitrite: NEGATIVE
Ph: 6 (ref 4.5–8.0)
Protein: NEGATIVE
RBC,UR: 2 /HPF (ref 0–5)
Squamous Epithelial: <1
WBC UR: 3 /HPF (ref 0–5)

## 2012-05-21 DIAGNOSIS — R0902 Hypoxemia: Secondary | ICD-10-CM | POA: Diagnosis not present

## 2012-05-21 DIAGNOSIS — I959 Hypotension, unspecified: Secondary | ICD-10-CM | POA: Diagnosis not present

## 2012-05-21 DIAGNOSIS — F05 Delirium due to known physiological condition: Secondary | ICD-10-CM | POA: Diagnosis not present

## 2012-05-21 DIAGNOSIS — R918 Other nonspecific abnormal finding of lung field: Secondary | ICD-10-CM | POA: Diagnosis not present

## 2012-05-21 DIAGNOSIS — J9819 Other pulmonary collapse: Secondary | ICD-10-CM | POA: Diagnosis not present

## 2012-05-21 LAB — HEMOGLOBIN: HGB: 7.7 g/dL — ABNORMAL LOW (ref 12.0–16.0)

## 2012-05-22 DIAGNOSIS — J9819 Other pulmonary collapse: Secondary | ICD-10-CM | POA: Diagnosis not present

## 2012-05-22 DIAGNOSIS — R0902 Hypoxemia: Secondary | ICD-10-CM | POA: Diagnosis not present

## 2012-05-22 DIAGNOSIS — F05 Delirium due to known physiological condition: Secondary | ICD-10-CM | POA: Diagnosis not present

## 2012-05-22 DIAGNOSIS — I959 Hypotension, unspecified: Secondary | ICD-10-CM | POA: Diagnosis not present

## 2012-05-23 DIAGNOSIS — M109 Gout, unspecified: Secondary | ICD-10-CM | POA: Diagnosis not present

## 2012-05-23 DIAGNOSIS — G9340 Encephalopathy, unspecified: Secondary | ICD-10-CM | POA: Diagnosis not present

## 2012-05-23 DIAGNOSIS — Z5189 Encounter for other specified aftercare: Secondary | ICD-10-CM | POA: Diagnosis not present

## 2012-05-23 DIAGNOSIS — M6281 Muscle weakness (generalized): Secondary | ICD-10-CM | POA: Diagnosis not present

## 2012-05-23 DIAGNOSIS — R0902 Hypoxemia: Secondary | ICD-10-CM | POA: Diagnosis not present

## 2012-05-23 DIAGNOSIS — E7800 Pure hypercholesterolemia, unspecified: Secondary | ICD-10-CM | POA: Diagnosis not present

## 2012-05-23 DIAGNOSIS — I10 Essential (primary) hypertension: Secondary | ICD-10-CM | POA: Diagnosis not present

## 2012-05-23 DIAGNOSIS — I959 Hypotension, unspecified: Secondary | ICD-10-CM | POA: Diagnosis not present

## 2012-05-23 DIAGNOSIS — Z96659 Presence of unspecified artificial knee joint: Secondary | ICD-10-CM | POA: Diagnosis not present

## 2012-05-23 DIAGNOSIS — R6889 Other general symptoms and signs: Secondary | ICD-10-CM | POA: Diagnosis not present

## 2012-05-23 DIAGNOSIS — M1990 Unspecified osteoarthritis, unspecified site: Secondary | ICD-10-CM | POA: Diagnosis not present

## 2012-05-23 DIAGNOSIS — J9819 Other pulmonary collapse: Secondary | ICD-10-CM | POA: Diagnosis not present

## 2012-05-23 DIAGNOSIS — F329 Major depressive disorder, single episode, unspecified: Secondary | ICD-10-CM | POA: Diagnosis not present

## 2012-05-23 DIAGNOSIS — F05 Delirium due to known physiological condition: Secondary | ICD-10-CM | POA: Diagnosis not present

## 2012-05-23 DIAGNOSIS — R269 Unspecified abnormalities of gait and mobility: Secondary | ICD-10-CM | POA: Diagnosis not present

## 2012-05-23 DIAGNOSIS — Z471 Aftercare following joint replacement surgery: Secondary | ICD-10-CM | POA: Diagnosis not present

## 2012-05-23 LAB — CREATININE, SERUM
Creatinine: 0.77 mg/dL (ref 0.60–1.30)
EGFR (Non-African Amer.): >60

## 2012-05-24 ENCOUNTER — Encounter: Payer: Self-pay | Admitting: Internal Medicine

## 2012-05-24 DIAGNOSIS — M1990 Unspecified osteoarthritis, unspecified site: Secondary | ICD-10-CM | POA: Diagnosis not present

## 2012-05-24 DIAGNOSIS — G9340 Encephalopathy, unspecified: Secondary | ICD-10-CM | POA: Diagnosis not present

## 2012-05-31 DIAGNOSIS — D649 Anemia, unspecified: Secondary | ICD-10-CM | POA: Diagnosis not present

## 2012-05-31 DIAGNOSIS — Z96659 Presence of unspecified artificial knee joint: Secondary | ICD-10-CM | POA: Diagnosis not present

## 2012-05-31 DIAGNOSIS — I10 Essential (primary) hypertension: Secondary | ICD-10-CM | POA: Diagnosis not present

## 2012-05-31 DIAGNOSIS — Z471 Aftercare following joint replacement surgery: Secondary | ICD-10-CM | POA: Diagnosis not present

## 2012-05-31 DIAGNOSIS — IMO0001 Reserved for inherently not codable concepts without codable children: Secondary | ICD-10-CM | POA: Diagnosis not present

## 2012-06-02 DIAGNOSIS — I10 Essential (primary) hypertension: Secondary | ICD-10-CM | POA: Diagnosis not present

## 2012-06-02 DIAGNOSIS — IMO0001 Reserved for inherently not codable concepts without codable children: Secondary | ICD-10-CM | POA: Diagnosis not present

## 2012-06-02 DIAGNOSIS — Z471 Aftercare following joint replacement surgery: Secondary | ICD-10-CM | POA: Diagnosis not present

## 2012-06-02 DIAGNOSIS — Z96659 Presence of unspecified artificial knee joint: Secondary | ICD-10-CM | POA: Diagnosis not present

## 2012-06-02 DIAGNOSIS — D649 Anemia, unspecified: Secondary | ICD-10-CM | POA: Diagnosis not present

## 2012-06-03 DIAGNOSIS — Z96659 Presence of unspecified artificial knee joint: Secondary | ICD-10-CM | POA: Diagnosis not present

## 2012-06-03 DIAGNOSIS — I10 Essential (primary) hypertension: Secondary | ICD-10-CM | POA: Diagnosis not present

## 2012-06-03 DIAGNOSIS — D649 Anemia, unspecified: Secondary | ICD-10-CM | POA: Diagnosis not present

## 2012-06-03 DIAGNOSIS — IMO0001 Reserved for inherently not codable concepts without codable children: Secondary | ICD-10-CM | POA: Diagnosis not present

## 2012-06-03 DIAGNOSIS — Z471 Aftercare following joint replacement surgery: Secondary | ICD-10-CM | POA: Diagnosis not present

## 2012-06-06 DIAGNOSIS — Z96659 Presence of unspecified artificial knee joint: Secondary | ICD-10-CM | POA: Diagnosis not present

## 2012-06-06 DIAGNOSIS — I10 Essential (primary) hypertension: Secondary | ICD-10-CM | POA: Diagnosis not present

## 2012-06-06 DIAGNOSIS — D649 Anemia, unspecified: Secondary | ICD-10-CM | POA: Diagnosis not present

## 2012-06-06 DIAGNOSIS — IMO0001 Reserved for inherently not codable concepts without codable children: Secondary | ICD-10-CM | POA: Diagnosis not present

## 2012-06-06 DIAGNOSIS — Z471 Aftercare following joint replacement surgery: Secondary | ICD-10-CM | POA: Diagnosis not present

## 2012-06-08 ENCOUNTER — Ambulatory Visit (INDEPENDENT_AMBULATORY_CARE_PROVIDER_SITE_OTHER): Payer: Medicare Other | Admitting: Internal Medicine

## 2012-06-08 ENCOUNTER — Encounter: Payer: Self-pay | Admitting: Internal Medicine

## 2012-06-08 VITALS — BP 100/60 | HR 70 | Temp 98.6°F | Ht 64.5 in | Wt 153.5 lb

## 2012-06-08 DIAGNOSIS — I10 Essential (primary) hypertension: Secondary | ICD-10-CM | POA: Diagnosis not present

## 2012-06-08 DIAGNOSIS — I959 Hypotension, unspecified: Secondary | ICD-10-CM

## 2012-06-08 DIAGNOSIS — D649 Anemia, unspecified: Secondary | ICD-10-CM

## 2012-06-08 DIAGNOSIS — M129 Arthropathy, unspecified: Secondary | ICD-10-CM

## 2012-06-08 DIAGNOSIS — M1990 Unspecified osteoarthritis, unspecified site: Secondary | ICD-10-CM

## 2012-06-08 DIAGNOSIS — E7800 Pure hypercholesterolemia, unspecified: Secondary | ICD-10-CM | POA: Diagnosis not present

## 2012-06-08 DIAGNOSIS — K219 Gastro-esophageal reflux disease without esophagitis: Secondary | ICD-10-CM

## 2012-06-08 DIAGNOSIS — E875 Hyperkalemia: Secondary | ICD-10-CM

## 2012-06-08 LAB — IBC PANEL
Iron: 116 ug/dL (ref 42–145)
Transferrin: 272.8 mg/dL (ref 212.0–360.0)

## 2012-06-08 LAB — CBC WITH DIFFERENTIAL/PLATELET
Eosinophils Absolute: 0.4 10*3/uL (ref 0.0–0.7)
Eosinophils Relative: 7 % — ABNORMAL HIGH (ref 0.0–5.0)
MCV: 98.1 fl (ref 78.0–100.0)
Monocytes Absolute: 0.5 10*3/uL (ref 0.1–1.0)
Neutrophils Relative %: 43 % (ref 43.0–77.0)
Platelets: 541 10*3/uL — ABNORMAL HIGH (ref 150.0–400.0)
WBC: 5.6 10*3/uL (ref 4.5–10.5)

## 2012-06-08 NOTE — Assessment & Plan Note (Addendum)
Blood pressure running low lately.  Will adjust meds as outlined.  Taper off the Metoprolol.  Continue Diovan.  Follow pressures.  Check met b.

## 2012-06-08 NOTE — Assessment & Plan Note (Signed)
On Protonix bid.  Symptoms overall controlled.  Will see if we can get her off Celebrex.  Follow.

## 2012-06-08 NOTE — Assessment & Plan Note (Signed)
Per patient report, recent cholesterol panel elevated.  Obtain results for review.  Low cholesterol diet and exercise.  Will follow.

## 2012-06-08 NOTE — Patient Instructions (Addendum)
It was nice seeing you today.  I am glad to see you are doing well - regarding your knee surgery.  I am going to adjust you Metoprolol - given that your blood pressure has been running low.  I want you to take 1/2(25mg ) tab every other day x 6 days and then stop.  Continue your Diovan as you have been doing.  Stop the Celebrex.  Monitor your blood pressures and send a record of the readings in - for my review.

## 2012-06-08 NOTE — Progress Notes (Signed)
Subjective:    Patient ID: Leah Huber, female    DOB: 1941/05/10, 71 y.o.   MRN: [id number]  HPI 71 year old female with past history of hypertension, hypercholesterolemia and recent left knee surgery.  She required a blood transfusion (one unit) while in the hospital.  Discharge Hgb - 8.  Since her discharge, she has noticed her blood pressure has been low.  (BP averaging - 90s/50-60).  She is on Diovan and Metoprolol.  Has noticed some occasional lightheadedness (noticed more when she goes from a lying position to a standing position).  No signficant dizziness.  No increased acid reflux.  She is taking Celebrex and feels it may be aggravating her stomach (minimal aggrevation).  She is taking iron bid.  Stool is dark from this.  No BRBPR.  No abdominal pain or cramping.    Past Medical History  Diagnosis Date  . Arthritis   . Depression   . Diverticulitis   . Hypertension   . Hyperlipidemia   . Colon polyp   . History of blood transfusion   . Urinary incontinence     Review of Systems Patient denies any headache.  No chest pain, tightness or palpatations. No increased shortness of breath, cough or congestion.  No nausea or vomiting.  No signficant acid reflux.  No abdominal pain or cramping.  No bowel change, such as diarrhea or BRBPR.  She has been a little constipated since being on the iron.  Taking stool softeners now and bowels have improved. No significant constipation now.   No urine change.  Doing well since her knee surgery.  Was in rehab for one week at [facility]  Is now getting home PT.  Sees Dr Rosita Kea next week for follow up.         Objective:   Physical Exam Filed Vitals:   06/08/12 0947  BP: 100/60  Pulse: 70  Temp: 98.6 F (37 C)   Standing blood pressure 98/58, lying blood pressure 25/65  71 year old female in no acute distress.   HEENT:  Nares - clear.  OP- without lesions or erythema.  Moist mucus membranes.  NECK:  Supple, nontender.  No audible bruit.     HEART:  Appears to be regular. LUNGS:  Without crackles or wheezing audible.  Respirations even and unlabored.  Good breath sounds bilaterally.    RADIAL PULSE:  Diminished on the right.  (unchanged - stable).   ABDOMEN:  Soft, nontender.  No audible abdominal bruit.  RECTAL:  Heme negative.    EXTREMITIES:  No increased edema to be present.  Knee incision - appears to be healing well.  No increased erythema or warmth.  Some increased soft tissue swelling - around the knee (improved per pt).                   Assessment & Plan:  1.  Hypotension.  Blood pressure as outlined.  Need to adjust her meds.  Will taper off the Metoprolol as outlined.  Continue Diovan as she has been doing.  Check cbc (given recent worsening anemia and need for blood transfusion).  Will have her monitor her blood pressure, record and send in for review.    2.  Anemia.  Check cbc and iron studies as outlined.  Heme negative on exam.  Has had GI w/up.  Will obtain records for review.  Continue iron supplements.   3.  Health Maintenance.  She is scheduled for a complete physical next month.

## 2012-06-08 NOTE — Assessment & Plan Note (Addendum)
S/P recent left knee surgery.  Performed by Dr Rosita Kea.  Doing well.  Continuing with home physical therapy currently.  Has follow up with Dr Rosita Kea next week.  Given her GI issues, recent worsening anemia, and controlled pain - will see if we can get her off Celebrex.

## 2012-06-09 DIAGNOSIS — D649 Anemia, unspecified: Secondary | ICD-10-CM | POA: Diagnosis not present

## 2012-06-09 DIAGNOSIS — Z471 Aftercare following joint replacement surgery: Secondary | ICD-10-CM | POA: Diagnosis not present

## 2012-06-09 DIAGNOSIS — IMO0001 Reserved for inherently not codable concepts without codable children: Secondary | ICD-10-CM | POA: Diagnosis not present

## 2012-06-09 DIAGNOSIS — I10 Essential (primary) hypertension: Secondary | ICD-10-CM | POA: Diagnosis not present

## 2012-06-09 DIAGNOSIS — Z96659 Presence of unspecified artificial knee joint: Secondary | ICD-10-CM | POA: Diagnosis not present

## 2012-06-09 LAB — FERRITIN: Ferritin: 296.9 ng/mL — ABNORMAL HIGH (ref 10.0–291.0)

## 2012-06-09 LAB — BASIC METABOLIC PANEL WITH GFR
GFR, Est African American: 63 mL/min
GFR, Est Non African American: 54 mL/min — ABNORMAL LOW
Potassium: 5.6 mEq/L — ABNORMAL HIGH (ref 3.5–5.3)
Sodium: 139 mEq/L (ref 135–145)

## 2012-06-09 NOTE — Addendum Note (Signed)
Addended by: Charm Barges on: 06/09/2012 03:43 PM   Modules accepted: Orders

## 2012-06-10 ENCOUNTER — Other Ambulatory Visit (INDEPENDENT_AMBULATORY_CARE_PROVIDER_SITE_OTHER): Payer: Medicare Other

## 2012-06-10 DIAGNOSIS — Z471 Aftercare following joint replacement surgery: Secondary | ICD-10-CM | POA: Diagnosis not present

## 2012-06-10 DIAGNOSIS — I10 Essential (primary) hypertension: Secondary | ICD-10-CM | POA: Diagnosis not present

## 2012-06-10 DIAGNOSIS — Z96659 Presence of unspecified artificial knee joint: Secondary | ICD-10-CM | POA: Diagnosis not present

## 2012-06-10 DIAGNOSIS — IMO0001 Reserved for inherently not codable concepts without codable children: Secondary | ICD-10-CM | POA: Diagnosis not present

## 2012-06-10 DIAGNOSIS — D649 Anemia, unspecified: Secondary | ICD-10-CM | POA: Diagnosis not present

## 2012-06-10 DIAGNOSIS — E875 Hyperkalemia: Secondary | ICD-10-CM | POA: Diagnosis not present

## 2012-06-10 LAB — CALCIUM: Calcium: 10 mg/dL (ref 8.4–10.5)

## 2012-06-13 DIAGNOSIS — M25669 Stiffness of unspecified knee, not elsewhere classified: Secondary | ICD-10-CM | POA: Diagnosis not present

## 2012-06-13 DIAGNOSIS — M6281 Muscle weakness (generalized): Secondary | ICD-10-CM | POA: Diagnosis not present

## 2012-06-13 DIAGNOSIS — M25569 Pain in unspecified knee: Secondary | ICD-10-CM | POA: Diagnosis not present

## 2012-06-13 DIAGNOSIS — Z96659 Presence of unspecified artificial knee joint: Secondary | ICD-10-CM | POA: Diagnosis not present

## 2012-06-15 ENCOUNTER — Other Ambulatory Visit (INDEPENDENT_AMBULATORY_CARE_PROVIDER_SITE_OTHER): Payer: Medicare Other

## 2012-06-15 DIAGNOSIS — E875 Hyperkalemia: Secondary | ICD-10-CM

## 2012-06-15 DIAGNOSIS — Z96659 Presence of unspecified artificial knee joint: Secondary | ICD-10-CM | POA: Diagnosis not present

## 2012-06-15 LAB — POTASSIUM: Potassium: 4.8 mEq/L (ref 3.5–5.1)

## 2012-06-16 DIAGNOSIS — M6281 Muscle weakness (generalized): Secondary | ICD-10-CM | POA: Diagnosis not present

## 2012-06-16 DIAGNOSIS — M25669 Stiffness of unspecified knee, not elsewhere classified: Secondary | ICD-10-CM | POA: Diagnosis not present

## 2012-06-16 DIAGNOSIS — M25569 Pain in unspecified knee: Secondary | ICD-10-CM | POA: Diagnosis not present

## 2012-06-16 DIAGNOSIS — Z96659 Presence of unspecified artificial knee joint: Secondary | ICD-10-CM | POA: Diagnosis not present

## 2012-06-23 DIAGNOSIS — M25669 Stiffness of unspecified knee, not elsewhere classified: Secondary | ICD-10-CM | POA: Diagnosis not present

## 2012-06-23 DIAGNOSIS — M6281 Muscle weakness (generalized): Secondary | ICD-10-CM | POA: Diagnosis not present

## 2012-06-23 DIAGNOSIS — M25569 Pain in unspecified knee: Secondary | ICD-10-CM | POA: Diagnosis not present

## 2012-06-23 DIAGNOSIS — Z96659 Presence of unspecified artificial knee joint: Secondary | ICD-10-CM | POA: Diagnosis not present

## 2012-06-27 DIAGNOSIS — H40009 Preglaucoma, unspecified, unspecified eye: Secondary | ICD-10-CM | POA: Diagnosis not present

## 2012-06-28 DIAGNOSIS — Z96659 Presence of unspecified artificial knee joint: Secondary | ICD-10-CM | POA: Diagnosis not present

## 2012-06-28 DIAGNOSIS — IMO0001 Reserved for inherently not codable concepts without codable children: Secondary | ICD-10-CM | POA: Diagnosis not present

## 2012-06-28 DIAGNOSIS — Z471 Aftercare following joint replacement surgery: Secondary | ICD-10-CM | POA: Diagnosis not present

## 2012-06-28 DIAGNOSIS — M25669 Stiffness of unspecified knee, not elsewhere classified: Secondary | ICD-10-CM | POA: Diagnosis not present

## 2012-06-28 DIAGNOSIS — I10 Essential (primary) hypertension: Secondary | ICD-10-CM | POA: Diagnosis not present

## 2012-06-28 DIAGNOSIS — M25569 Pain in unspecified knee: Secondary | ICD-10-CM | POA: Diagnosis not present

## 2012-06-28 DIAGNOSIS — M6281 Muscle weakness (generalized): Secondary | ICD-10-CM | POA: Diagnosis not present

## 2012-07-06 DIAGNOSIS — Z96659 Presence of unspecified artificial knee joint: Secondary | ICD-10-CM | POA: Diagnosis not present

## 2012-07-06 DIAGNOSIS — M6281 Muscle weakness (generalized): Secondary | ICD-10-CM | POA: Diagnosis not present

## 2012-07-06 DIAGNOSIS — M25569 Pain in unspecified knee: Secondary | ICD-10-CM | POA: Diagnosis not present

## 2012-07-06 DIAGNOSIS — M25669 Stiffness of unspecified knee, not elsewhere classified: Secondary | ICD-10-CM | POA: Diagnosis not present

## 2012-07-14 DIAGNOSIS — M25569 Pain in unspecified knee: Secondary | ICD-10-CM | POA: Diagnosis not present

## 2012-07-14 DIAGNOSIS — M25669 Stiffness of unspecified knee, not elsewhere classified: Secondary | ICD-10-CM | POA: Diagnosis not present

## 2012-07-14 DIAGNOSIS — Z96659 Presence of unspecified artificial knee joint: Secondary | ICD-10-CM | POA: Diagnosis not present

## 2012-07-14 DIAGNOSIS — M6281 Muscle weakness (generalized): Secondary | ICD-10-CM | POA: Diagnosis not present

## 2012-07-19 DIAGNOSIS — Z96659 Presence of unspecified artificial knee joint: Secondary | ICD-10-CM | POA: Diagnosis not present

## 2012-07-19 DIAGNOSIS — M25569 Pain in unspecified knee: Secondary | ICD-10-CM | POA: Diagnosis not present

## 2012-07-19 DIAGNOSIS — M6281 Muscle weakness (generalized): Secondary | ICD-10-CM | POA: Diagnosis not present

## 2012-07-19 DIAGNOSIS — M25669 Stiffness of unspecified knee, not elsewhere classified: Secondary | ICD-10-CM | POA: Diagnosis not present

## 2012-07-21 DIAGNOSIS — M6281 Muscle weakness (generalized): Secondary | ICD-10-CM | POA: Diagnosis not present

## 2012-07-21 DIAGNOSIS — M25569 Pain in unspecified knee: Secondary | ICD-10-CM | POA: Diagnosis not present

## 2012-07-21 DIAGNOSIS — Z96659 Presence of unspecified artificial knee joint: Secondary | ICD-10-CM | POA: Diagnosis not present

## 2012-07-21 DIAGNOSIS — M25669 Stiffness of unspecified knee, not elsewhere classified: Secondary | ICD-10-CM | POA: Diagnosis not present

## 2012-07-22 ENCOUNTER — Ambulatory Visit: Payer: Self-pay | Admitting: Internal Medicine

## 2012-07-22 MED ORDER — ESCITALOPRAM OXALATE 10 MG PO TABS: 10.0000 mg | ORAL_TABLET | Freq: Every day | ORAL | Status: DC

## 2012-07-22 MED ORDER — PANTOPRAZOLE SODIUM 40 MG PO TBEC: 40.0000 mg | DELAYED_RELEASE_TABLET | Freq: Two times a day (BID) | ORAL | Status: DC

## 2012-07-22 MED ORDER — VALSARTAN 80 MG PO TABS: 80.0000 mg | ORAL_TABLET | Freq: Every day | ORAL | Status: DC

## 2012-07-22 MED ORDER — SIMVASTATIN 10 MG PO TABS: 10.0000 mg | ORAL_TABLET | Freq: Every day | ORAL | Status: DC

## 2012-07-22 NOTE — Progress Notes (Signed)
Encounter opened by mistake.  Just needed to refill meds.  meds refilled - protonix (#90 with 3 refills), lexapro (#90 with one refill), diovan 80mg  (#90 with 3 refills) and simvastatin 10mg  #90 with 3 refills.

## 2012-07-26 ENCOUNTER — Encounter: Payer: Self-pay | Admitting: Internal Medicine

## 2012-07-27 DIAGNOSIS — M6281 Muscle weakness (generalized): Secondary | ICD-10-CM | POA: Diagnosis not present

## 2012-07-27 DIAGNOSIS — M25569 Pain in unspecified knee: Secondary | ICD-10-CM | POA: Diagnosis not present

## 2012-07-27 DIAGNOSIS — Z96659 Presence of unspecified artificial knee joint: Secondary | ICD-10-CM | POA: Diagnosis not present

## 2012-07-27 DIAGNOSIS — M25669 Stiffness of unspecified knee, not elsewhere classified: Secondary | ICD-10-CM | POA: Diagnosis not present

## 2012-07-28 ENCOUNTER — Encounter: Payer: Self-pay | Admitting: Internal Medicine

## 2012-07-28 ENCOUNTER — Ambulatory Visit (INDEPENDENT_AMBULATORY_CARE_PROVIDER_SITE_OTHER): Payer: Medicare Other | Admitting: Internal Medicine

## 2012-07-28 VITALS — BP 124/68 | HR 85 | Temp 98.0°F | Ht 64.5 in | Wt 155.5 lb

## 2012-07-28 DIAGNOSIS — I10 Essential (primary) hypertension: Secondary | ICD-10-CM

## 2012-07-28 DIAGNOSIS — D649 Anemia, unspecified: Secondary | ICD-10-CM | POA: Insufficient documentation

## 2012-07-28 DIAGNOSIS — K219 Gastro-esophageal reflux disease without esophagitis: Secondary | ICD-10-CM

## 2012-07-28 DIAGNOSIS — M129 Arthropathy, unspecified: Secondary | ICD-10-CM

## 2012-07-28 DIAGNOSIS — E7800 Pure hypercholesterolemia, unspecified: Secondary | ICD-10-CM

## 2012-07-28 DIAGNOSIS — R32 Unspecified urinary incontinence: Secondary | ICD-10-CM

## 2012-07-28 DIAGNOSIS — Z139 Encounter for screening, unspecified: Secondary | ICD-10-CM | POA: Diagnosis not present

## 2012-07-28 DIAGNOSIS — M1990 Unspecified osteoarthritis, unspecified site: Secondary | ICD-10-CM

## 2012-07-28 NOTE — Patient Instructions (Addendum)
It was nice seeing you again today.  I am glad you are doing well.  Let me know if you need anything.

## 2012-07-28 NOTE — Assessment & Plan Note (Signed)
Followed at [facility].

## 2012-07-29 DIAGNOSIS — M6281 Muscle weakness (generalized): Secondary | ICD-10-CM | POA: Diagnosis not present

## 2012-07-29 DIAGNOSIS — M25669 Stiffness of unspecified knee, not elsewhere classified: Secondary | ICD-10-CM | POA: Diagnosis not present

## 2012-07-29 DIAGNOSIS — Z96659 Presence of unspecified artificial knee joint: Secondary | ICD-10-CM | POA: Diagnosis not present

## 2012-07-29 DIAGNOSIS — M25569 Pain in unspecified knee: Secondary | ICD-10-CM | POA: Diagnosis not present

## 2012-08-02 DIAGNOSIS — M6281 Muscle weakness (generalized): Secondary | ICD-10-CM | POA: Diagnosis not present

## 2012-08-02 DIAGNOSIS — M25669 Stiffness of unspecified knee, not elsewhere classified: Secondary | ICD-10-CM | POA: Diagnosis not present

## 2012-08-02 DIAGNOSIS — M25569 Pain in unspecified knee: Secondary | ICD-10-CM | POA: Diagnosis not present

## 2012-08-02 DIAGNOSIS — Z96659 Presence of unspecified artificial knee joint: Secondary | ICD-10-CM | POA: Diagnosis not present

## 2012-08-05 ENCOUNTER — Encounter: Payer: Self-pay | Admitting: Internal Medicine

## 2012-08-09 ENCOUNTER — Other Ambulatory Visit: Payer: Self-pay | Admitting: *Deleted

## 2012-08-09 MED ORDER — FLUTICASONE PROPIONATE 50 MCG/ACT NA SUSP: NASAL | Status: DC

## 2012-08-09 NOTE — Telephone Encounter (Signed)
Called patient and also sent in presdcription

## 2012-08-09 NOTE — Telephone Encounter (Signed)
Sent in to pharmacy.  

## 2012-08-12 ENCOUNTER — Other Ambulatory Visit: Payer: Medicare Other

## 2012-08-14 ENCOUNTER — Encounter: Payer: Self-pay | Admitting: Internal Medicine

## 2012-08-14 NOTE — Assessment & Plan Note (Signed)
On simvastatin.  Low cholesterol diet and exercise.  Check lipid panel and liver function with next fasting labs.

## 2012-08-14 NOTE — Assessment & Plan Note (Signed)
Symptoms controlled on Protonix.  Follow.    

## 2012-08-14 NOTE — Progress Notes (Signed)
Subjective:    Patient ID: Leah Huber, female    DOB: 06/06/1941, 71 y.o.   MRN: [id number]  HPI 71 year old female with past history of hypertension, hypercholesterolemia, GERD and anemia who comes in today to follow up on these issues as well as for a complete physical exam.  States she feels better and feels she is doing well.  Blood pressure has been doing well.  No lightheadedness or dizziness.  No cardiac symptoms with increased activity or exertion.  No sob.  eathing and drinking well.  Blood pressure doing well.  She does report a runny nose and some drainage.  Clear.  No chest congestion or sore throat.    Past Medical History  Diagnosis Date  . Arthritis   . Depression   . Diverticulosis   . Hypertension   . Hypercholesterolemia   . Colon polyp   . Urinary incontinence   . Abnormal liver function test 12/10  . Hypovolemia 12/10    acute renal failure    Current Outpatient Prescriptions on File Prior to Visit  Medication Sig Dispense Refill  . darifenacin (ENABLEX) 15 MG 24 hr tablet Take 15 mg by mouth at bedtime.      Marland Kitchen escitalopram (LEXAPRO) 10 MG tablet Take 1 tablet (10 mg total) by mouth daily.  90 tablet  1  . HYDROcodone-acetaminophen (NORCO/VICODIN) 5-325 MG per tablet Take 1 tablet by mouth every 4 (four) hours as needed.      . nitrofurantoin (MACRODANTIN) 50 MG capsule Take 50 mg by mouth at bedtime.      . pantoprazole (PROTONIX) 40 MG tablet Take 1 tablet (40 mg total) by mouth 2 (two) times daily.  180 tablet  3  . simvastatin (ZOCOR) 10 MG tablet Take 1 tablet (10 mg total) by mouth at bedtime.  90 tablet  3  . valsartan (DIOVAN) 80 MG tablet Take 1 tablet (80 mg total) by mouth daily.  90 tablet  3  . celecoxib (CELEBREX) 200 MG capsule Take 200 mg by mouth 2 (two) times daily.      . Cholecalciferol (VITAMIN D) 2000 UNITS CAPS Take 1 capsule by mouth daily.      . ferrous fumarate-b12-vitamic C-folic acid (FEROCON) capsule Take 1 capsule by mouth 2 (two)  times daily.      . fluticasone (FLONASE) 50 MCG/ACT nasal spray Two sprays in each nostril every day  16 g  1  . metoprolol tartrate (LOPRESSOR) 25 MG tablet Take 25 mg by mouth 2 (two) times daily.      Marland Kitchen senna (SENOKOT) 8.6 MG tablet Take 1 tablet by mouth 2 (two) times daily.        Review of Systems Patient denies any headache, lightheadedness or dizziness.  Increased runny nose and drainage as outlined.  No chest pain, tightness or palpitations.  No increased shortness of breath, cough or congestion.  No nausea or vomiting.  No abdominal pain or cramping.  No bowel change, such as diarrhea, constipation, BRBPR or melana.  No urine change.        Objective:   Physical Exam Filed Vitals:   07/28/12 1544  BP: 124/68  Pulse: 85  Temp: 98 F (67.45 C)   71 year old female in no acute distress.   HEENT:  Nares- clear.  Oropharynx - without lesions. NECK:  Supple.  Nontender.  No audible bruit.  HEART:  Appears to be regular. LUNGS:  No crackles or wheezing audible.  Respirations even and  unlabored.  RADIAL PULSE:  Equal bilaterally.    BREASTS:  No nipple discharge or nipple retraction present.  Could not appreciate any distinct nodules or axillary adenopathy.  ABDOMEN:  Soft, nontender.  Bowel sounds present and normal.  No audible abdominal bruit.  GU:  Normal external genitalia.  Vaginal vault without lesions.  S/P hysterectomy.  Could not appreciate any adnexal masses or tenderness.   RECTAL:  Heme negative.   EXTREMITIES:  No increased edema present.  DP pulses palpable and equal bilaterally.           Assessment & Plan:  GI.  Colonoscopy 04/04/09 revealed diverticulosis and internal hemorrhoids.  EGD - revealed gastritis.  Continue on Protonix.  Symptoms controlled.  Currently asymptomatic.    CARDIOVASCULAR.   Last ECHO revealed normal heart function.  Recent stress test negative for ischemia.  Currently asymptomatic.    PREVIOUS ORTHOSTATIC HYPOTENSION.  Not an issue for  her now.  Blood pressure is doing well.  Follow.    HEALTH MAINTENANCE.  Physical today.  She is s/p hysterectomy.  Colonoscopy as outlined.  Mammogram 05/22/11 Birads II.  Need to schedule a follow up mammogram.  Normal bone density 2007.

## 2012-08-14 NOTE — Assessment & Plan Note (Signed)
Is followed at [facility].  On enablex.  Follow.

## 2012-08-14 NOTE — Assessment & Plan Note (Signed)
Stable

## 2012-08-14 NOTE — Assessment & Plan Note (Signed)
Follow cbc.  

## 2012-08-14 NOTE — Assessment & Plan Note (Signed)
Blood pressure is under good control.  Same meds.  Follow metabolic panel.   

## 2012-08-15 ENCOUNTER — Ambulatory Visit (INDEPENDENT_AMBULATORY_CARE_PROVIDER_SITE_OTHER): Payer: Medicare Other | Admitting: Internal Medicine

## 2012-08-15 ENCOUNTER — Telehealth: Payer: Self-pay | Admitting: Internal Medicine

## 2012-08-15 ENCOUNTER — Encounter: Payer: Self-pay | Admitting: Internal Medicine

## 2012-08-15 VITALS — BP 129/84 | HR 83 | Temp 98.6°F | Ht 64.5 in | Wt 152.5 lb

## 2012-08-15 DIAGNOSIS — I10 Essential (primary) hypertension: Secondary | ICD-10-CM | POA: Diagnosis not present

## 2012-08-15 MED ORDER — CEFUROXIME AXETIL 250 MG PO TABS: 250.0000 mg | ORAL_TABLET | Freq: Two times a day (BID) | ORAL | Status: DC

## 2012-08-15 NOTE — Telephone Encounter (Signed)
Patient Information:  Caller Name: [place]  Phone: [phone]  Patient: Leah Huber  Gender: Female  DOB: October 25, 1940  Age: 71 Years  PCP: [facility] [place]   Symptoms  Reason For Call & Symptoms: Sinus headaceh, nasal discharge green and bloody in color, sore throat.  Reviewed Health History In EMR: Yes  Reviewed Medications In EMR: Yes  Reviewed Allergies In EMR: Yes  Reviewed Surgeries / Procedures: Yes  Date of Onset of Symptoms: 08/13/2012  Treatments Tried: Nasonex  Treatments Tried Worked: Yes  Guideline(s) Used:  Sinus Pain and Congestion  Disposition Per Guideline:   See Today or Tomorrow in Office  Reason For Disposition Reached:   Using nasal washes and pain medicine > 24 hours and sinus pain (lower forehead, cheekbone, or eye) persists  Advice Given:  Reassurance:   Sinus congestion is a normal part of a cold.  Usually home treatment with nasal washes can prevent an actual bacterial sinus infection.  Antibiotics are not helpful for the sinus congestion that occurs with colds.  For a Runny Nose With Profuse Discharge:  Nasal mucus and discharge helps to wash viruses and bacteria out of the nose and sinuses.  Blowing the nose is all that is needed.  If the skin around your nostrils gets irritated, apply a tiny amount of petroleum ointment to the nasal openings once or twice a day.  Pain and Fever Medicines:  For pain or fever relief, take either acetaminophen or ibuprofen.  They are over-the-counter (OTC) drugs that help treat both fever and pain. You can buy them at the drugstore.  Treat fevers above 101 F (38.3 C). The goal of fever therapy is to bring the fever down to a comfortable level. Remember that fever medicine usually lowers fever 2 degrees F (1 - 1 1/2 degrees C).  Hydration:  Drink plenty of liquids (6-8 glasses of water daily). If the air in your home is dry, use a cool mist humidifier  Call Back If:   Severe pain lasts longer than 2 hours after  pain medicine  Sinus pain lasts longer than 1 day after starting treatment using nasal washes  Sinus congestion (fullness) lasts longer than 10 days  You become worse.  Office Follow Up:  Does the office need to follow up with this patient?: No  Instructions For The Office: N/A  Appointment Scheduled:  08/15/2012 16:15:00 Appointment Scheduled Provider:  Dale Cable

## 2012-08-15 NOTE — Patient Instructions (Addendum)
I am going to start you on ceftin 250mg  - take one tablet twice a day.  Saline flushes - flush nose at least 2-3x/day.  flonase - 2 sprays each nostril in the evening.  mucinex in the am and robitussin in the evening.  Rest.  Plenty of fluids.  Let me know if problems.  Start align one per day.

## 2012-08-15 NOTE — Progress Notes (Signed)
Subjective:    Patient ID: Leah Huber, female    DOB: 08-02-1941, 71 y.o.   MRN: [id number]  HPI 71 year old female with past history of hypertension, hypercholesterolemia and GERD who comes in today as a work in with concerns regarding increased sinus congestion, sinus pressure and sore throat.  States symptoms started several days ago.  Increased sinus pressure and nasal congestion.  Green mucus production with blood tinge.  No fever.  Sore throat - which she attributes to the drainage.  No nausea or vomiting.  Some cough.  No chest congestion, tightness or sob.    Past Medical History  Diagnosis Date  . Arthritis   . Depression   . Diverticulosis   . Hypertension   . Hypercholesterolemia   . Colon polyp   . Urinary incontinence   . Abnormal liver function test 12/10  . Hypovolemia 12/10    acute renal failure    Current Outpatient Prescriptions on File Prior to Visit  Medication Sig Dispense Refill  . celecoxib (CELEBREX) 200 MG capsule Take 200 mg by mouth 2 (two) times daily.      . Cholecalciferol (VITAMIN D) 2000 UNITS CAPS Take 1 capsule by mouth daily.      Marland Kitchen darifenacin (ENABLEX) 15 MG 24 hr tablet Take 15 mg by mouth at bedtime.      Marland Kitchen escitalopram (LEXAPRO) 10 MG tablet Take 1 tablet (10 mg total) by mouth daily.  90 tablet  1  . ferrous fumarate-b12-vitamic C-folic acid (FEROCON) capsule Take 1 capsule by mouth 2 (two) times daily.      . fluticasone (FLONASE) 50 MCG/ACT nasal spray Two sprays in each nostril every day  16 g  1  . HYDROcodone-acetaminophen (NORCO/VICODIN) 5-325 MG per tablet Take 1 tablet by mouth every 4 (four) hours as needed.      . metoprolol tartrate (LOPRESSOR) 25 MG tablet Take 25 mg by mouth 2 (two) times daily.      . nitrofurantoin (MACRODANTIN) 50 MG capsule Take 50 mg by mouth at bedtime.      . pantoprazole (PROTONIX) 40 MG tablet Take 1 tablet (40 mg total) by mouth 2 (two) times daily.  180 tablet  3  . senna (SENOKOT) 8.6 MG tablet  Take 1 tablet by mouth 2 (two) times daily.      . simvastatin (ZOCOR) 10 MG tablet Take 1 tablet (10 mg total) by mouth at bedtime.  90 tablet  3  . valsartan (DIOVAN) 80 MG tablet Take 1 tablet (80 mg total) by mouth daily.  90 tablet  3    Review of Systems Patient denies any headache, lightheadedness or dizziness.  Does report the increased sinus pressure and drainage.  Sore throat.  No chest pain, tightness or palpitations.  No increased shortness of breath.  Some minimal cough.  No nausea or vomiting.  No abdominal pain or cramping.  No bowel change, such as diarrhea, constipation.  No urine change.        Objective:   Physical Exam Filed Vitals:   08/15/12 1634  BP: 129/84  Pulse: 83  Temp: 98.6 F (60 C)   71 year old female in no acute distress.   HEENT:  Nares - clear except slightly erythematous turbinates.  OP- without lesions or erythema.  TMs visualized - without erythema.  Minimal tenderness to palpation over the sinuses.  NECK:  Supple, nontender.    HEART:  Appears to be regular. LUNGS:  Without crackles or wheezing  audible.  Respirations even and unlabored.      Assessment & Plan:  PROBABLE SINUSITIS.  Treat with ceftin 250mg  bid x 10 days.  Flush with saline nasal spray - at least 2-3x/day.  Flonase nasal spray as directed.  Mucinex in the am and Robitussin in the evening.  Rest.  Fluids.  Explained to her if symptoms changed, worsened or did not resolve - she was to be reevaluated.    PREVIOUS C. DIFF.  Bowels doing well now.  Take Align with above treatment - as directed.

## 2012-08-15 NOTE — Assessment & Plan Note (Signed)
Blood pressure under good control.  Same meds.  

## 2012-08-15 NOTE — Telephone Encounter (Signed)
Pt scheduled an appt today at 4:15

## 2012-08-17 ENCOUNTER — Other Ambulatory Visit: Payer: Medicare Other

## 2012-08-24 ENCOUNTER — Telehealth: Payer: Self-pay | Admitting: Internal Medicine

## 2012-08-24 ENCOUNTER — Other Ambulatory Visit: Payer: Medicare Other

## 2012-08-24 LAB — URINALYSIS, COMPLETE
Bilirubin,UR: NEGATIVE
Hyaline Cast: 51
Nitrite: NEGATIVE
Protein: 100
Specific Gravity: 1.026 (ref 1.003–1.030)
Squamous Epithelial: 9
WBC UR: 27 /HPF (ref 0–5)

## 2012-08-24 LAB — COMPREHENSIVE METABOLIC PANEL
Albumin: 4.3 g/dL (ref 3.4–5.0)
Anion Gap: 7 (ref 7–16)
BUN: 14 mg/dL (ref 7–18)
Bilirubin,Total: 0.3 mg/dL (ref 0.2–1.0)
Calcium, Total: 10 mg/dL (ref 8.5–10.1)
Chloride: 103 mmol/L (ref 98–107)
Co2: 26 mmol/L (ref 21–32)
EGFR (African American): >60
EGFR (Non-African Amer.): 58 — ABNORMAL LOW
Glucose: 111 mg/dL — ABNORMAL HIGH (ref 65–99)
Osmolality: 273 (ref 275–301)
Potassium: 3.7 mmol/L (ref 3.5–5.1)
SGOT(AST): 27 U/L (ref 15–37)
Sodium: 136 mmol/L (ref 136–145)

## 2012-08-24 LAB — CBC
Platelet: 333 10*3/uL (ref 150–440)
RBC: 3.88 10*6/uL (ref 3.80–5.20)
WBC: 12.2 10*3/uL — ABNORMAL HIGH (ref 3.6–11.0)

## 2012-08-24 LAB — CLOSTRIDIUM DIFFICILE BY PCR

## 2012-08-24 NOTE — Telephone Encounter (Signed)
If increased diarrhea - multiple episodes (given her history) - she needs eval today - rec acute care today for eval (since I am not in the office).  and then I can follow up with her afterwards.

## 2012-08-24 NOTE — Telephone Encounter (Signed)
She is at er now and will follow with [place] after.

## 2012-08-24 NOTE — Telephone Encounter (Signed)
Patient Information:  Caller Name: [place]  Phone: [phone]  Patient: Leah Huber, Leah Huber  Gender: Female  DOB: May 13, 1941  Age: 71 Years  PCP: [facility]   Office Follow Up:  Does the office need to follow up with this patient?: Yes  Instructions For The Office: Needs appt please for diarrhea, incontinent while on Ceftin.  She has stopped the antibiotic.   Symptoms  Reason For Call & Symptoms: Having diarrhea while on Ceftin, had incontinence of stool during the night.  Reviewed Health History In EMR: Yes  Reviewed Medications In EMR: Yes  Reviewed Allergies In EMR: Yes  Reviewed Surgeries / Procedures: Yes  Date of Onset of Symptoms: 08/22/2012  Guideline(s) Used:  Diarrhea  Disposition Per Guideline:   Go to Office Now  Reason For Disposition Reached:   Age > 60 years and has had > 6 diarrhea stools in past 24 hours  Advice Given:  N/A

## 2012-08-25 ENCOUNTER — Inpatient Hospital Stay: Payer: Self-pay | Admitting: Student

## 2012-08-25 DIAGNOSIS — N390 Urinary tract infection, site not specified: Secondary | ICD-10-CM | POA: Diagnosis not present

## 2012-08-25 DIAGNOSIS — I10 Essential (primary) hypertension: Secondary | ICD-10-CM | POA: Diagnosis not present

## 2012-08-25 DIAGNOSIS — Z881 Allergy status to other antibiotic agents status: Secondary | ICD-10-CM | POA: Diagnosis not present

## 2012-08-25 DIAGNOSIS — R197 Diarrhea, unspecified: Secondary | ICD-10-CM | POA: Diagnosis not present

## 2012-08-25 DIAGNOSIS — Z90710 Acquired absence of both cervix and uterus: Secondary | ICD-10-CM | POA: Diagnosis not present

## 2012-08-25 DIAGNOSIS — E785 Hyperlipidemia, unspecified: Secondary | ICD-10-CM | POA: Diagnosis not present

## 2012-08-25 DIAGNOSIS — Z8249 Family history of ischemic heart disease and other diseases of the circulatory system: Secondary | ICD-10-CM | POA: Diagnosis not present

## 2012-08-25 DIAGNOSIS — Z882 Allergy status to sulfonamides status: Secondary | ICD-10-CM | POA: Diagnosis not present

## 2012-08-25 DIAGNOSIS — A0472 Enterocolitis due to Clostridium difficile, not specified as recurrent: Secondary | ICD-10-CM | POA: Diagnosis not present

## 2012-08-25 DIAGNOSIS — Z9089 Acquired absence of other organs: Secondary | ICD-10-CM | POA: Diagnosis not present

## 2012-08-25 DIAGNOSIS — K219 Gastro-esophageal reflux disease without esophagitis: Secondary | ICD-10-CM | POA: Diagnosis present

## 2012-08-25 DIAGNOSIS — D649 Anemia, unspecified: Secondary | ICD-10-CM | POA: Diagnosis present

## 2012-08-25 DIAGNOSIS — Z7902 Long term (current) use of antithrombotics/antiplatelets: Secondary | ICD-10-CM | POA: Diagnosis not present

## 2012-08-25 DIAGNOSIS — E869 Volume depletion, unspecified: Secondary | ICD-10-CM | POA: Diagnosis not present

## 2012-08-25 DIAGNOSIS — Z96659 Presence of unspecified artificial knee joint: Secondary | ICD-10-CM | POA: Diagnosis not present

## 2012-08-26 LAB — BASIC METABOLIC PANEL
BUN: 8 mg/dL (ref 7–18)
Chloride: 111 mmol/L — ABNORMAL HIGH (ref 98–107)
Co2: 25 mmol/L (ref 21–32)
Creatinine: 0.57 mg/dL — ABNORMAL LOW (ref 0.60–1.30)
EGFR (African American): >60
EGFR (Non-African Amer.): >60
Glucose: 92 mg/dL (ref 65–99)
Potassium: 3.2 mmol/L — ABNORMAL LOW (ref 3.5–5.1)
Sodium: 143 mmol/L (ref 136–145)

## 2012-08-26 LAB — CBC WITH DIFFERENTIAL/PLATELET
Basophil #: 0.1 10*3/uL (ref 0.0–0.1)
Eosinophil #: 0.3 10*3/uL (ref 0.0–0.7)
HGB: 9.8 g/dL — ABNORMAL LOW (ref 12.0–16.0)
Lymphocyte #: 1.7 10*3/uL (ref 1.0–3.6)
MCH: 32.1 pg (ref 26.0–34.0)
MCHC: 33.6 g/dL (ref 32.0–36.0)
MCV: 96 fL (ref 80–100)
Monocyte #: 0.6 x10 3/mm (ref 0.2–0.9)
Platelet: 253 10*3/uL (ref 150–440)
RBC: 3.06 10*6/uL — ABNORMAL LOW (ref 3.80–5.20)
RDW: 14.5 % (ref 11.5–14.5)
WBC: 6.4 10*3/uL (ref 3.6–11.0)

## 2012-08-27 LAB — CBC WITH DIFFERENTIAL/PLATELET
Eosinophil #: 0.4 10*3/uL (ref 0.0–0.7)
Eosinophil %: 7.6 %
Lymphocyte #: 1.9 10*3/uL (ref 1.0–3.6)
MCH: 32.5 pg (ref 26.0–34.0)
MCHC: 34.3 g/dL (ref 32.0–36.0)
MCV: 95 fL (ref 80–100)
Monocyte #: 0.5 x10 3/mm (ref 0.2–0.9)
Platelet: 262 10*3/uL (ref 150–440)
RBC: 3.05 10*6/uL — ABNORMAL LOW (ref 3.80–5.20)

## 2012-08-29 ENCOUNTER — Other Ambulatory Visit: Payer: Self-pay | Admitting: Internal Medicine

## 2012-08-29 ENCOUNTER — Telehealth: Payer: Self-pay | Admitting: Internal Medicine

## 2012-08-29 DIAGNOSIS — D72819 Decreased white blood cell count, unspecified: Secondary | ICD-10-CM

## 2012-08-29 NOTE — Progress Notes (Signed)
Order placed for cbc to be added to other labs

## 2012-08-29 NOTE — Telephone Encounter (Signed)
Pt was recently released from [facility] for C-Diff and they were saying that her White Blood count was low. She is scheduled for this Thursday to come in for labs. She was wondering if white blood count could be added to those labs.

## 2012-08-29 NOTE — Telephone Encounter (Signed)
I put order in for cbc to be added to upcoming labs.  Please notify pt and make carolyn aware

## 2012-08-30 NOTE — Telephone Encounter (Signed)
Patient notified

## 2012-09-01 ENCOUNTER — Other Ambulatory Visit (INDEPENDENT_AMBULATORY_CARE_PROVIDER_SITE_OTHER): Payer: Medicare Other

## 2012-09-01 ENCOUNTER — Telehealth: Payer: Self-pay | Admitting: Internal Medicine

## 2012-09-01 DIAGNOSIS — D72819 Decreased white blood cell count, unspecified: Secondary | ICD-10-CM | POA: Diagnosis not present

## 2012-09-01 DIAGNOSIS — E7800 Pure hypercholesterolemia, unspecified: Secondary | ICD-10-CM | POA: Diagnosis not present

## 2012-09-01 LAB — CBC WITH DIFFERENTIAL/PLATELET
Basophils Relative: 0.7 % (ref 0.0–3.0)
Eosinophils Relative: 5.3 % — ABNORMAL HIGH (ref 0.0–5.0)
HCT: 33.4 % — ABNORMAL LOW (ref 36.0–46.0)
Lymphs Abs: 1.8 10*3/uL (ref 0.7–4.0)
MCV: 96.2 fl (ref 78.0–100.0)
Monocytes Absolute: 0.5 10*3/uL (ref 0.1–1.0)
Neutrophils Relative %: 56.8 % (ref 43.0–77.0)
RBC: 3.48 Mil/uL — ABNORMAL LOW (ref 3.87–5.11)
WBC: 6 10*3/uL (ref 4.5–10.5)

## 2012-09-01 LAB — LIPID PANEL
HDL: 59.3 mg/dL (ref >39.00)
LDL Cholesterol: 51 mg/dL (ref 0–99)
Total CHOL/HDL Ratio: 2
VLDL: 17.8 mg/dL (ref 0.0–40.0)

## 2012-09-01 LAB — HEPATIC FUNCTION PANEL: Total Bilirubin: 0.4 mg/dL (ref 0.3–1.2)

## 2012-09-01 NOTE — Telephone Encounter (Signed)
Call to see how patient was doing after release from [facility]. Left message for patient's daughter-n-law to return call.

## 2012-09-01 NOTE — Telephone Encounter (Signed)
Pt was discharged from [facility] on 12.21.13 and is set up for a f/u.

## 2012-09-02 ENCOUNTER — Other Ambulatory Visit: Payer: Self-pay | Admitting: Internal Medicine

## 2012-09-02 DIAGNOSIS — D649 Anemia, unspecified: Secondary | ICD-10-CM

## 2012-09-02 NOTE — Progress Notes (Signed)
Order placed for a follow up cbc and iron studies to be done within the next few days.

## 2012-09-02 NOTE — Telephone Encounter (Signed)
Left message for patient's daughter for a return call.

## 2012-09-12 ENCOUNTER — Other Ambulatory Visit: Payer: Medicare Other

## 2012-09-12 DIAGNOSIS — Z96659 Presence of unspecified artificial knee joint: Secondary | ICD-10-CM | POA: Diagnosis not present

## 2012-09-13 ENCOUNTER — Other Ambulatory Visit: Payer: Self-pay | Admitting: Internal Medicine

## 2012-09-13 ENCOUNTER — Ambulatory Visit (INDEPENDENT_AMBULATORY_CARE_PROVIDER_SITE_OTHER): Payer: Medicare Other | Admitting: Internal Medicine

## 2012-09-13 VITALS — BP 128/80 | HR 80 | Temp 98.1°F | Wt 148.0 lb

## 2012-09-13 DIAGNOSIS — K219 Gastro-esophageal reflux disease without esophagitis: Secondary | ICD-10-CM

## 2012-09-13 DIAGNOSIS — D649 Anemia, unspecified: Secondary | ICD-10-CM

## 2012-09-13 DIAGNOSIS — N390 Urinary tract infection, site not specified: Secondary | ICD-10-CM | POA: Diagnosis not present

## 2012-09-13 DIAGNOSIS — A0472 Enterocolitis due to Clostridium difficile, not specified as recurrent: Secondary | ICD-10-CM | POA: Diagnosis not present

## 2012-09-13 DIAGNOSIS — I10 Essential (primary) hypertension: Secondary | ICD-10-CM

## 2012-09-13 DIAGNOSIS — R197 Diarrhea, unspecified: Secondary | ICD-10-CM | POA: Diagnosis not present

## 2012-09-13 LAB — CBC WITH DIFFERENTIAL/PLATELET
Basophil %: 1.3 %
Eosinophil #: 0.5 10*3/uL (ref 0.0–0.7)
Eosinophil %: 5.7 %
HCT: 36.6 % (ref 35.0–47.0)
HGB: 11.8 g/dL — ABNORMAL LOW (ref 12.0–16.0)
Lymphocyte #: 3.5 10*3/uL (ref 1.0–3.6)
Lymphocyte %: 41.8 %
MCHC: 32.2 g/dL (ref 32.0–36.0)
RBC: 3.82 10*6/uL (ref 3.80–5.20)
WBC: 8.4 10*3/uL (ref 3.6–11.0)

## 2012-09-13 LAB — POCT URINALYSIS DIPSTICK
Ketones, UA: NEGATIVE
Spec Grav, UA: >=1.03
pH, UA: 5

## 2012-09-13 LAB — BASIC METABOLIC PANEL
Anion Gap: 10 (ref 7–16)
BUN: 19 mg/dL — ABNORMAL HIGH (ref 7–18)
Co2: 24 mmol/L (ref 21–32)
Creatinine: 0.68 mg/dL (ref 0.60–1.30)
EGFR (African American): >60
Osmolality: 280 (ref 275–301)
Potassium: 3.8 mmol/L (ref 3.5–5.1)
Sodium: 139 mmol/L (ref 136–145)

## 2012-09-13 LAB — CLOSTRIDIUM DIFFICILE BY PCR

## 2012-09-13 MED ORDER — PHENAZOPYRIDINE HCL 200 MG PO TABS: 200.0000 mg | ORAL_TABLET | Freq: Three times a day (TID) | ORAL | Status: DC | PRN

## 2012-09-14 ENCOUNTER — Encounter: Payer: Self-pay | Admitting: Internal Medicine

## 2012-09-14 NOTE — Progress Notes (Signed)
Subjective:    Patient ID: Leah Huber, female    DOB: 05-31-1941, 72 y.o.   MRN: [id number]  HPI 72 year old female with past history of hypertension and hypercholesterolemia who comes in today for a hospital follow up.  Was previously treated with ceftin.  After being on the medicine for a few days, she developed diarrhea.  Was hospitalized 08/25/12-08/27/12 and found to have C. diff colitis.  This was the second hospitalization for C. Diff.  She reports she started having urinary symptoms last week. Dysuria, etc.  Has a history of reoccurring uti's.  Her daily suppressive abx was stopped while she was in the hospital.  She called her son-n-law (Dr Gwen Pounds) and was prescribed cipro for her uti.  After 1 1/2 days of abx - she developed diarrhea again.  The diarrhea started several days ago.  She continues to have some "squirts", but the consistency is now a little more formed.  Some nausea now.  She comes in today stating that she is having increased dysuria and increased burning with end urination.  Feels like her previous uti's.  Still with the bowel change.  She is eating and drinking.  Does have some nausea, but no vomiting.  No blood in the stool.  No fever.  Some increased fatigue.   Past Medical History  Diagnosis Date  . Arthritis   . Depression   . Diverticulosis   . Hypertension   . Hypercholesterolemia   . Colon polyp   . Urinary incontinence   . Abnormal liver function test 12/10  . Hypovolemia 12/10    acute renal failure    Current Outpatient Prescriptions on File Prior to Visit  Medication Sig Dispense Refill  . celecoxib (CELEBREX) 200 MG capsule Take 200 mg by mouth 2 (two) times daily.      . Cholecalciferol (VITAMIN D) 2000 UNITS CAPS Take 1 capsule by mouth daily.      Marland Kitchen darifenacin (ENABLEX) 15 MG 24 hr tablet Take 15 mg by mouth at bedtime.      Marland Kitchen escitalopram (LEXAPRO) 10 MG tablet Take 1 tablet (10 mg total) by mouth daily.  90 tablet  1  . ferrous  fumarate-b12-vitamic C-folic acid (FEROCON) capsule Take 1 capsule by mouth 2 (two) times daily.      . fluticasone (FLONASE) 50 MCG/ACT nasal spray Two sprays in each nostril every day  16 g  1  . HYDROcodone-acetaminophen (NORCO/VICODIN) 5-325 MG per tablet Take 1 tablet by mouth every 4 (four) hours as needed.      . metoprolol tartrate (LOPRESSOR) 25 MG tablet Take 25 mg by mouth 2 (two) times daily.      . pantoprazole (PROTONIX) 40 MG tablet Take 1 tablet (40 mg total) by mouth 2 (two) times daily.  180 tablet  3  . senna (SENOKOT) 8.6 MG tablet Take 1 tablet by mouth 2 (two) times daily.      . simvastatin (ZOCOR) 10 MG tablet Take 1 tablet (10 mg total) by mouth at bedtime.  90 tablet  3  . valsartan (DIOVAN) 80 MG tablet Take 1 tablet (80 mg total) by mouth daily.  90 tablet  3    Review of Systems Patient denies any headache, lightheadedness or dizziness.  No chest pain, tightness or palpitations.  No increased shortness of breath, cough or congestion.  No vomiting.  Does report the nausea as outlined.   No significant abdominal pain or cramping.  Bowel change as outlined.  No  BRBPR or melana.  Urinary symptoms as outlined.  No vaginal symptoms.  Tolerating po's.        Objective:   Physical Exam Filed Vitals:   09/13/12 1444  BP: 128/80  Pulse: 80  Temp: 98.1 F (69.69 C)   72 year old female in no acute distress.   HEENT:  Nares - clear.  OP- without lesions or erythema.  NECK:  Supple, nontender.  No audible bruit.   HEART:  Appears to be regular. LUNGS:  Without crackles or wheezing audible.  Respirations even and unlabored.   RADIAL PULSE:  Equal bilaterally.  ABDOMEN:  Soft, nontender.  No tenderness to palpation.  No audible abdominal bruit.  BACK.  No CVA tenderness.  Non tender to palpation.   EXTREMITIES:  No increased edema to be present.                  Assessment & Plan:  QUESTION OF REOCCURRING C. DIFF.  Has been hospitalized on two separate occasions  recently.  Took abx last week.  Now with diarrhea and symptoms as outlined.  Check cbc.  Check stool for C. Diff.  Hold starting Flagyl.  Stay hydrated.  Check metabolic panel.  Given persistent and reoccurring problems with C. Diff - will have GI evaluated.  Discussed importance of taking a probiotic - Florastor bid.    UTI.  Symptoms appear to be c/w a urinary tract infection.  Urine dip - positive nitrite, small leukocytes, moderate blood.  Will send for culture.  Treat with pyridium 200mg  tid x 2 days.  After discussion with the pt, will treat with the pyridium and await culture results.  If she gets any worse, she will call or be reevaluated.  Will hold abx until culture returns.    LEUKOCYTOSIS.  Repeat cbc previously revealed a normal wbc.  Will recheck today.

## 2012-09-14 NOTE — Assessment & Plan Note (Signed)
Continues on protonix.  Some nausea, but overall symptoms appear to be under control.  Follow.

## 2012-09-14 NOTE — Assessment & Plan Note (Signed)
Recheck cbc today to confirm stable.  Has not noticed any blood in stool, etc.

## 2012-09-14 NOTE — Assessment & Plan Note (Signed)
Blood pressure under good control.  Same medication.  Check metabolic panel.  Stay hydrated.

## 2012-09-15 LAB — URINE CULTURE

## 2012-09-16 ENCOUNTER — Telehealth: Payer: Self-pay | Admitting: Internal Medicine

## 2012-09-16 NOTE — Telephone Encounter (Signed)
Pt notified of urine results.  She is feeling better.  Diarrhea improved.  Taking flagyl.  Urinary symptoms improved.  Will hold on abx.  She will call if any problems.

## 2012-09-16 NOTE — Telephone Encounter (Signed)
Dr. what should I tell the patient concerning her urine culture?

## 2012-09-16 NOTE — Telephone Encounter (Signed)
Wanting urine culture results.

## 2012-09-23 ENCOUNTER — Encounter: Payer: Self-pay | Admitting: Internal Medicine

## 2012-09-27 ENCOUNTER — Ambulatory Visit: Payer: Self-pay | Admitting: Internal Medicine

## 2012-09-27 DIAGNOSIS — N6459 Other signs and symptoms in breast: Secondary | ICD-10-CM | POA: Diagnosis not present

## 2012-09-27 DIAGNOSIS — Z1231 Encounter for screening mammogram for malignant neoplasm of breast: Secondary | ICD-10-CM | POA: Diagnosis not present

## 2012-09-27 DIAGNOSIS — K591 Functional diarrhea: Secondary | ICD-10-CM | POA: Diagnosis not present

## 2012-09-28 ENCOUNTER — Encounter: Payer: Self-pay | Admitting: Internal Medicine

## 2012-10-07 ENCOUNTER — Encounter: Payer: Self-pay | Admitting: Internal Medicine

## 2012-11-10 DIAGNOSIS — R300 Dysuria: Secondary | ICD-10-CM | POA: Diagnosis not present

## 2012-11-10 DIAGNOSIS — N3941 Urge incontinence: Secondary | ICD-10-CM | POA: Diagnosis not present

## 2012-11-10 DIAGNOSIS — N3020 Other chronic cystitis without hematuria: Secondary | ICD-10-CM | POA: Diagnosis not present

## 2012-11-25 ENCOUNTER — Encounter: Payer: Self-pay | Admitting: Internal Medicine

## 2012-11-25 ENCOUNTER — Ambulatory Visit: Payer: Medicare Other

## 2012-11-25 ENCOUNTER — Ambulatory Visit (INDEPENDENT_AMBULATORY_CARE_PROVIDER_SITE_OTHER): Payer: Medicare Other | Admitting: Internal Medicine

## 2012-11-25 VITALS — BP 140/70 | HR 74 | Temp 98.5°F | Ht 64.5 in | Wt 153.5 lb

## 2012-11-25 DIAGNOSIS — M1990 Unspecified osteoarthritis, unspecified site: Secondary | ICD-10-CM

## 2012-11-25 DIAGNOSIS — I10 Essential (primary) hypertension: Secondary | ICD-10-CM

## 2012-11-25 DIAGNOSIS — D649 Anemia, unspecified: Secondary | ICD-10-CM

## 2012-11-25 DIAGNOSIS — R32 Unspecified urinary incontinence: Secondary | ICD-10-CM

## 2012-11-25 DIAGNOSIS — M129 Arthropathy, unspecified: Secondary | ICD-10-CM

## 2012-11-25 DIAGNOSIS — K219 Gastro-esophageal reflux disease without esophagitis: Secondary | ICD-10-CM | POA: Diagnosis not present

## 2012-11-25 DIAGNOSIS — E7800 Pure hypercholesterolemia, unspecified: Secondary | ICD-10-CM | POA: Diagnosis not present

## 2012-11-25 LAB — CBC WITH DIFFERENTIAL/PLATELET
Basophils Absolute: 0.1 10*3/uL (ref 0.0–0.1)
Basophils Relative: 1 % (ref 0–1)
Eosinophils Absolute: 0.3 10*3/uL (ref 0.0–0.7)
Eosinophils Relative: 5 % (ref 0–5)
Lymphs Abs: 3 10*3/uL (ref 0.7–4.0)
MCH: 32.5 pg (ref 26.0–34.0)
MCHC: 33.3 g/dL (ref 30.0–36.0)
MCV: 97.5 fL (ref 78.0–100.0)
Neutrophils Relative %: 38 % — ABNORMAL LOW (ref 43–77)
Platelets: 315 10*3/uL (ref 150–400)
RBC: 3.6 MIL/uL — ABNORMAL LOW (ref 3.87–5.11)
RDW: 15 % (ref 11.5–15.5)

## 2012-11-25 MED ORDER — TOLTERODINE TARTRATE ER 4 MG PO CP24: 4.0000 mg | ORAL_CAPSULE | Freq: Every day | ORAL | Status: DC

## 2012-11-27 ENCOUNTER — Encounter: Payer: Self-pay | Admitting: Internal Medicine

## 2012-11-27 NOTE — Assessment & Plan Note (Signed)
Continues on protonix.  Currently doing well.  Follow.   

## 2012-11-27 NOTE — Assessment & Plan Note (Signed)
Recheck cbc today to confirm stable.  Has not noticed any blood in stool, etc.

## 2012-11-27 NOTE — Assessment & Plan Note (Signed)
Blood pressure under good control.  Same medication.  Follow metabolic panel.   

## 2012-11-27 NOTE — Assessment & Plan Note (Signed)
Is followed at [facility].  On enablex.  Wants to change back to Detrol LA.  Will make the change.  Follow.

## 2012-11-27 NOTE — Progress Notes (Signed)
Subjective:    Patient ID: Leah Huber, female    DOB: 04-Jun-1941, 72 y.o.   MRN: [id number]  HPI 72 year old female with past history of hypertension and hypercholesterolemia who comes in today for a scheduled follow up.  Was previously treated with ceftin.  After being on the medicine for a few days, she developed diarrhea.  Was hospitalized 08/25/12-08/27/12 and found to have C. diff colitis.  This was the second hospitalization for C. Diff.  See last note for details.  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  Instructed to take Florastor bid.  Has done well.  Stools more formed.  No abdominal pain or cramping.  No blood in the stool.  Wants to stop enablex and restart detrol.  Feels she did better on detrol LA.  Tolerated well.  Eating and drinking well.  Staying active.    Past Medical History  Diagnosis Date  . Arthritis   . Depression   . Diverticulosis   . Hypertension   . Hypercholesterolemia   . Colon polyp   . Urinary incontinence   . Abnormal liver function test 12/10  . Hypovolemia 12/10    acute renal failure    Current Outpatient Prescriptions on File Prior to Visit  Medication Sig Dispense Refill  . escitalopram (LEXAPRO) 10 MG tablet Take 1 tablet (10 mg total) by mouth daily.  90 tablet  1  . fluticasone (FLONASE) 50 MCG/ACT nasal spray Two sprays in each nostril every day  16 g  1  . simvastatin (ZOCOR) 10 MG tablet Take 1 tablet (10 mg total) by mouth at bedtime.  90 tablet  3  . valsartan (DIOVAN) 80 MG tablet Take 1 tablet (80 mg total) by mouth daily.  90 tablet  3  . celecoxib (CELEBREX) 200 MG capsule Take 200 mg by mouth 2 (two) times daily.      . Cholecalciferol (VITAMIN D) 2000 UNITS CAPS Take 1 capsule by mouth daily.      . ferrous fumarate-b12-vitamic C-folic acid (FEROCON) capsule Take 1 capsule by mouth 2 (two) times daily.      Marland Kitchen HYDROcodone-acetaminophen (NORCO/VICODIN) 5-325 MG per tablet Take 1 tablet by mouth every 4 (four) hours as  needed.      . metoprolol tartrate (LOPRESSOR) 25 MG tablet Take 25 mg by mouth 2 (two) times daily.      . phenazopyridine (PYRIDIUM) 200 MG tablet Take 1 tablet (200 mg total) by mouth 3 (three) times daily as needed for pain.  6 tablet  0  . senna (SENOKOT) 8.6 MG tablet Take 1 tablet by mouth 2 (two) times daily.       No current facility-administered medications on file prior to visit.    Review of Systems Patient denies any headache, lightheadedness or dizziness.  No significant sinus or allergy symptoms.  No chest pain, tightness or palpitations.  No increased shortness of breath, cough or congestion.  No vomiting or nausea.  No significant abdominal pain or cramping.  Bowels doing better. Stool more formed. Just saw GI.  See above.  No BRBPR or melana.   No vaginal symptoms.  Tolerating po's.        Objective:   Physical Exam  Filed Vitals:   11/25/12 1405  BP: 140/70  Pulse: 74  Temp: 98.5 F (18.56 C)   72 year old female in no acute distress.   HEENT:  Nares - clear.  OP- without lesions or erythema.  NECK:  Supple,  nontender.  No audible bruit.   HEART:  Appears to be regular. LUNGS:  Without crackles or wheezing audible.  Respirations even and unlabored.   RADIAL PULSE:  Equal bilaterally.  ABDOMEN:  Soft, nontender.  No tenderness to palpation.  No audible abdominal bruit.  BACK.  No CVA tenderness.  Non tender to palpation.   EXTREMITIES:  No increased edema to be present.                  Assessment & Plan:  QUESTION OF REOCCURRING C. DIFF.  Was hospitalized on two separate occasions recently.  Saw GI.  Discussed importance of taking a probiotic - Florastor bid.  Doing better.  Follow.    GU.  Will change enablex to detrol LA q day.   Pt tolerated and felt this worked better.  Follow.     HEALTH MAINTENANCE.  Physical 07/28/12.  Mammogram 09/27/12 - BiRADS II.

## 2012-11-27 NOTE — Assessment & Plan Note (Signed)
On simvastatin.  Low cholesterol diet and exercise.  Check lipid panel and liver function with next fasting labs.

## 2012-11-27 NOTE — Assessment & Plan Note (Signed)
Stable

## 2012-11-28 ENCOUNTER — Telehealth: Payer: Self-pay | Admitting: Internal Medicine

## 2012-11-28 NOTE — Telephone Encounter (Signed)
Left message for pt to call office  Please schedule labs prior to July appointmetn

## 2012-11-28 NOTE — Telephone Encounter (Signed)
Pt notified of labs and need for a follow up fasting lab appt 1-2 days prior to 03/29/13.  Please schedule and call pt with lab appointment.  Thanks.

## 2012-12-05 NOTE — Telephone Encounter (Signed)
Left message on cell phone asking pt to call office °

## 2012-12-05 NOTE — Telephone Encounter (Signed)
Lab appointment 7/18 pt aware

## 2013-01-10 ENCOUNTER — Encounter: Payer: Self-pay | Admitting: Internal Medicine

## 2013-01-11 ENCOUNTER — Other Ambulatory Visit: Payer: Self-pay | Admitting: *Deleted

## 2013-01-11 MED ORDER — TOLTERODINE TARTRATE ER 4 MG PO CP24: 4.0000 mg | ORAL_CAPSULE | Freq: Every day | ORAL | Status: DC

## 2013-01-11 NOTE — Telephone Encounter (Signed)
Per pt's request. RX sent electronically to [facility] for them to send to [facility]

## 2013-01-22 ENCOUNTER — Other Ambulatory Visit: Payer: Self-pay | Admitting: Internal Medicine

## 2013-02-02 DIAGNOSIS — H4010X0 Unspecified open-angle glaucoma, stage unspecified: Secondary | ICD-10-CM | POA: Diagnosis not present

## 2013-02-10 ENCOUNTER — Other Ambulatory Visit: Payer: Self-pay | Admitting: *Deleted

## 2013-02-10 ENCOUNTER — Encounter: Payer: Self-pay | Admitting: Internal Medicine

## 2013-02-10 MED ORDER — PANTOPRAZOLE SODIUM 40 MG PO TBEC: 40.0000 mg | DELAYED_RELEASE_TABLET | Freq: Two times a day (BID) | ORAL | Status: DC

## 2013-02-10 NOTE — Telephone Encounter (Signed)
Sent in 30 day refill to [facility] [address] per pt request

## 2013-03-24 ENCOUNTER — Other Ambulatory Visit: Payer: Medicare Other

## 2013-03-27 ENCOUNTER — Other Ambulatory Visit (INDEPENDENT_AMBULATORY_CARE_PROVIDER_SITE_OTHER): Payer: Medicare Other

## 2013-03-27 DIAGNOSIS — D649 Anemia, unspecified: Secondary | ICD-10-CM | POA: Diagnosis not present

## 2013-03-27 DIAGNOSIS — H4010X0 Unspecified open-angle glaucoma, stage unspecified: Secondary | ICD-10-CM | POA: Diagnosis not present

## 2013-03-27 LAB — CBC WITH DIFFERENTIAL/PLATELET
Basophils Relative: 1.1 % (ref 0.0–3.0)
Eosinophils Relative: 5.8 % — ABNORMAL HIGH (ref 0.0–5.0)
HCT: 37.3 % (ref 36.0–46.0)
Hemoglobin: 12.4 g/dL (ref 12.0–15.0)
Lymphocytes Relative: 38.4 % (ref 12.0–46.0)
MCHC: 33.4 g/dL (ref 30.0–36.0)
Monocytes Absolute: 0.4 10*3/uL (ref 0.1–1.0)
Platelets: 324 10*3/uL (ref 150.0–400.0)
RDW: 14.7 % — ABNORMAL HIGH (ref 11.5–14.6)

## 2013-03-27 LAB — IBC PANEL
Iron: 109 ug/dL (ref 42–145)
Saturation Ratios: 28.7 % (ref 20.0–50.0)

## 2013-03-27 LAB — FERRITIN: Ferritin: 92.7 ng/mL (ref 10.0–291.0)

## 2013-03-27 NOTE — Addendum Note (Signed)
Addended by: Montine Circle D on: 03/27/2013 10:54 AM   Modules accepted: Orders

## 2013-03-28 ENCOUNTER — Other Ambulatory Visit: Payer: Medicare Other

## 2013-03-28 ENCOUNTER — Encounter: Payer: Self-pay | Admitting: Internal Medicine

## 2013-03-28 DIAGNOSIS — D7589 Other specified diseases of blood and blood-forming organs: Secondary | ICD-10-CM

## 2013-03-29 ENCOUNTER — Ambulatory Visit (INDEPENDENT_AMBULATORY_CARE_PROVIDER_SITE_OTHER): Payer: Medicare Other | Admitting: Internal Medicine

## 2013-03-29 ENCOUNTER — Encounter: Payer: Self-pay | Admitting: Internal Medicine

## 2013-03-29 ENCOUNTER — Telehealth: Payer: Self-pay | Admitting: *Deleted

## 2013-03-29 VITALS — BP 130/70 | HR 84 | Temp 98.7°F | Ht 64.5 in | Wt 153.8 lb

## 2013-03-29 DIAGNOSIS — I10 Essential (primary) hypertension: Secondary | ICD-10-CM

## 2013-03-29 DIAGNOSIS — K219 Gastro-esophageal reflux disease without esophagitis: Secondary | ICD-10-CM | POA: Diagnosis not present

## 2013-03-29 DIAGNOSIS — D649 Anemia, unspecified: Secondary | ICD-10-CM

## 2013-03-29 DIAGNOSIS — R5383 Other fatigue: Secondary | ICD-10-CM

## 2013-03-29 DIAGNOSIS — G4733 Obstructive sleep apnea (adult) (pediatric): Secondary | ICD-10-CM

## 2013-03-29 DIAGNOSIS — H409 Unspecified glaucoma: Secondary | ICD-10-CM

## 2013-03-29 DIAGNOSIS — M129 Arthropathy, unspecified: Secondary | ICD-10-CM | POA: Diagnosis not present

## 2013-03-29 DIAGNOSIS — M1990 Unspecified osteoarthritis, unspecified site: Secondary | ICD-10-CM

## 2013-03-29 DIAGNOSIS — R32 Unspecified urinary incontinence: Secondary | ICD-10-CM

## 2013-03-29 DIAGNOSIS — E7800 Pure hypercholesterolemia, unspecified: Secondary | ICD-10-CM

## 2013-03-29 MED ORDER — ESCITALOPRAM OXALATE 20 MG PO TABS: 20.0000 mg | ORAL_TABLET | Freq: Every day | ORAL | Status: DC

## 2013-03-29 NOTE — Telephone Encounter (Signed)
Faxed order to [facility] for overnight oximetry (fax# [phone])

## 2013-03-29 NOTE — Progress Notes (Addendum)
Subjective:    Patient ID: Leah Huber, female    DOB: 1941-06-13, 72 y.o.   MRN: [id number]  HPI 72 year old female with past history of hypertension and hypercholesterolemia who comes in today for a scheduled follow up.  Was previously treated with ceftin.  After being on the medicine for a few days, she developed diarrhea.  Was hospitalized 08/25/12-08/27/12 and found to have C. diff colitis.  This was the second hospitalization for C. Diff.  See previous notes for details.  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  Instructed to take Florastor bid.  No abdominal pain or cramping.  No blood in the stool.  Bowels stable now.  Eating and drinking well.  Staying active.  Detrol is working well for her.  She is seeing Dr Inez Pilgrim for glaucoma.  On eye drops.  He is following closely.  Overall she feels she is doing relatively well.  Still with some fatigue.  Discussed sleep apnea.  Does not feel rested when she wakes up in the morning.  Has a diagnosis of sleep apnea.  Cannot tolerate the mask.  Was questioning oxygen level at night.     Past Medical History  Diagnosis Date  . Arthritis   . Depression   . Diverticulosis   . Hypertension   . Hypercholesterolemia   . Colon polyp   . Urinary incontinence   . Abnormal liver function test 12/10  . Hypovolemia 12/10    acute renal failure    Current Outpatient Prescriptions on File Prior to Visit  Medication Sig Dispense Refill  . escitalopram (LEXAPRO) 10 MG tablet TAKE 1 TABLET DAILY  90 tablet  0  . fluticasone (FLONASE) 50 MCG/ACT nasal spray Two sprays in each nostril every day  16 g  1  . pantoprazole (PROTONIX) 40 MG tablet Take 1 tablet (40 mg total) by mouth 2 (two) times daily.  60 tablet  0  . simvastatin (ZOCOR) 10 MG tablet Take 1 tablet (10 mg total) by mouth at bedtime.  90 tablet  3  . tolterodine (DETROL LA) 4 MG 24 hr capsule Take 1 capsule (4 mg total) by mouth daily.  90 capsule  1  . valsartan (DIOVAN) 80 MG tablet  Take 1 tablet (80 mg total) by mouth daily.  90 tablet  3  . celecoxib (CELEBREX) 200 MG capsule Take 200 mg by mouth 2 (two) times daily.      . Cholecalciferol (VITAMIN D) 2000 UNITS CAPS Take 1 capsule by mouth daily.      . ferrous fumarate-b12-vitamic C-folic acid (FEROCON) capsule Take 1 capsule by mouth 2 (two) times daily.      Marland Kitchen HYDROcodone-acetaminophen (NORCO/VICODIN) 5-325 MG per tablet Take 1 tablet by mouth every 4 (four) hours as needed.      . metoprolol tartrate (LOPRESSOR) 25 MG tablet Take 25 mg by mouth 2 (two) times daily.      . phenazopyridine (PYRIDIUM) 200 MG tablet Take 1 tablet (200 mg total) by mouth 3 (three) times daily as needed for pain.  6 tablet  0  . saccharomyces boulardii (FLORASTOR) 250 MG capsule Take 250 mg by mouth 2 (two) times daily.      Marland Kitchen senna (SENOKOT) 8.6 MG tablet Take 1 tablet by mouth 2 (two) times daily.       No current facility-administered medications on file prior to visit.    Review of Systems Patient denies any headache, lightheadedness or dizziness.  No significant sinus  or allergy symptoms.  No chest pain, tightness or palpitations.  No increased shortness of breath, cough or congestion.  No vomiting or nausea.  No significant abdominal pain or cramping.  Bowels doing better. Stool more formed.  No BRBPR or melana.   No vaginal symptoms.  Appetite is good.  Does report increased fatigue.  Increased daytime somnolence.  Has known sleep apnea.  Unable to tolerate the mask.  Was questioning if the fatigue could be related to some increased depression.  Taking the lexapro.         Objective:   Physical Exam  Filed Vitals:   03/29/13 1057  BP: 130/70  Pulse: 84  Temp: 98.7 F (37.1 C)   Blood pressure recheck:  132/78, pulse 55  72 year old female in no acute distress.   HEENT:  Nares - clear.  OP- without lesions or erythema.  NECK:  Supple, nontender.  No audible bruit.   HEART:  Appears to be regular. LUNGS:  Without crackles  or wheezing audible.  Respirations even and unlabored.   RADIAL PULSE:  Equal bilaterally.  ABDOMEN:  Soft, nontender.  No tenderness to palpation.  No audible abdominal bruit.  BACK.  No CVA tenderness.  Non tender to palpation.   EXTREMITIES:  No increased edema to be present.                  Assessment & Plan:  FATIGUE.  Possibly multifactorial.  Recent hgb normal.  Will check B12.  Possible mild depression.  Will increase Lexapro to 20mg  q day.  Follow closely.  Also has known sleep apnea.  Unable to tolerate the mask.  Will check overnight oximetry to assess for hypoxia.    PREVIOUS C. DIFF.  Resolved.  Bowels stable.      GU.  Doing well on detrol.  Follow.   EAR FULLNESS.  Left ear fullness.  Use debrox.  Ear irrigation in a few days.      HEALTH MAINTENANCE.  Physical 07/28/12.  Mammogram 09/27/12 - BiRADS II.

## 2013-03-30 ENCOUNTER — Other Ambulatory Visit: Payer: Self-pay | Admitting: *Deleted

## 2013-03-30 ENCOUNTER — Other Ambulatory Visit: Payer: Medicare Other

## 2013-03-30 DIAGNOSIS — D7589 Other specified diseases of blood and blood-forming organs: Secondary | ICD-10-CM

## 2013-03-31 ENCOUNTER — Encounter: Payer: Self-pay | Admitting: Internal Medicine

## 2013-03-31 DIAGNOSIS — H409 Unspecified glaucoma: Secondary | ICD-10-CM | POA: Insufficient documentation

## 2013-03-31 NOTE — Assessment & Plan Note (Signed)
Stable

## 2013-03-31 NOTE — Assessment & Plan Note (Signed)
Continues on protonix.  Currently doing well.  Follow.   

## 2013-03-31 NOTE — Assessment & Plan Note (Signed)
Followed by Dr Brasington.    

## 2013-03-31 NOTE — Assessment & Plan Note (Signed)
On simvastatin.  Low cholesterol diet and exercise.  Follow lipid panel and liver function.      

## 2013-03-31 NOTE — Assessment & Plan Note (Signed)
Now on Detrol and doing well.  Follow.   

## 2013-03-31 NOTE — Assessment & Plan Note (Signed)
Recent hgb check 12.4 - normal.  Follow.

## 2013-03-31 NOTE — Assessment & Plan Note (Signed)
Blood pressure under good control.  Same medication.  Follow metabolic panel.   

## 2013-04-01 ENCOUNTER — Encounter: Payer: Self-pay | Admitting: Internal Medicine

## 2013-04-02 DIAGNOSIS — G4733 Obstructive sleep apnea (adult) (pediatric): Secondary | ICD-10-CM | POA: Diagnosis not present

## 2013-04-03 ENCOUNTER — Telehealth: Payer: Self-pay | Admitting: Internal Medicine

## 2013-04-03 NOTE — Telephone Encounter (Signed)
[facility] dropped off form for dr scott to sign  Please fax back to zack @ [phone] Any questions please call [phone]

## 2013-04-04 NOTE — Telephone Encounter (Signed)
Do you know where the form is?  Thanks.

## 2013-04-04 NOTE — Telephone Encounter (Signed)
In your folder-just needs signature

## 2013-04-04 NOTE — Telephone Encounter (Signed)
Done

## 2013-04-05 ENCOUNTER — Other Ambulatory Visit: Payer: Self-pay | Admitting: Internal Medicine

## 2013-04-05 ENCOUNTER — Encounter: Payer: Self-pay | Admitting: *Deleted

## 2013-04-07 ENCOUNTER — Ambulatory Visit (INDEPENDENT_AMBULATORY_CARE_PROVIDER_SITE_OTHER): Payer: Medicare Other | Admitting: *Deleted

## 2013-04-07 DIAGNOSIS — H6120 Impacted cerumen, unspecified ear: Secondary | ICD-10-CM

## 2013-04-07 DIAGNOSIS — H6122 Impacted cerumen, left ear: Secondary | ICD-10-CM

## 2013-04-10 ENCOUNTER — Telehealth: Payer: Self-pay | Admitting: *Deleted

## 2013-04-10 NOTE — Telephone Encounter (Signed)
Leah Huber with [facility] called to report that Medicare will NOT cover Leah Huber because of her Dx of OSA (regardless of the fact that we changed to Dx)-Leah Huber offered the patient a discounted price of $100/per month for the oxygen concentrator. Pt will think about it for a few days & Leah Huber states that she will follow-up with her around Thursday. Leah Huber states that this is one of the changes involving Medicare requirements.

## 2013-04-10 NOTE — Telephone Encounter (Signed)
Noted  

## 2013-04-12 ENCOUNTER — Other Ambulatory Visit: Payer: Self-pay

## 2013-04-12 ENCOUNTER — Telehealth: Payer: Self-pay | Admitting: Internal Medicine

## 2013-04-12 NOTE — Telephone Encounter (Signed)
Pt has questions about a oxygen test that was recently done and is wanting a call back to discuss this with someone.

## 2013-04-12 NOTE — Telephone Encounter (Signed)
Would you like me to call her first to get more info?

## 2013-04-12 NOTE — Telephone Encounter (Signed)
Yes, if we could just find out what she is wanting to know.  It may be regarding the insurance not covering because of diagnosis of sleep apnea.

## 2013-04-14 ENCOUNTER — Encounter: Payer: Self-pay | Admitting: *Deleted

## 2013-04-14 ENCOUNTER — Encounter: Payer: Self-pay | Admitting: Internal Medicine

## 2013-04-19 NOTE — Telephone Encounter (Signed)
Spoke with pt last week. Pt is going to proceed with O2 at night time and pay out of pocket for it. See other message

## 2013-04-28 ENCOUNTER — Other Ambulatory Visit: Payer: Self-pay | Admitting: *Deleted

## 2013-04-28 MED ORDER — PANTOPRAZOLE SODIUM 40 MG PO TBEC: 40.0000 mg | DELAYED_RELEASE_TABLET | Freq: Two times a day (BID) | ORAL | Status: DC

## 2013-05-30 ENCOUNTER — Other Ambulatory Visit (INDEPENDENT_AMBULATORY_CARE_PROVIDER_SITE_OTHER): Payer: Medicare Other

## 2013-05-30 DIAGNOSIS — I10 Essential (primary) hypertension: Secondary | ICD-10-CM

## 2013-05-30 DIAGNOSIS — E7800 Pure hypercholesterolemia, unspecified: Secondary | ICD-10-CM

## 2013-05-30 DIAGNOSIS — R5381 Other malaise: Secondary | ICD-10-CM

## 2013-05-30 DIAGNOSIS — R5383 Other fatigue: Secondary | ICD-10-CM

## 2013-05-30 LAB — HEPATIC FUNCTION PANEL
AST: 22 U/L (ref 0–37)
Albumin: 4.2 g/dL (ref 3.5–5.2)
Bilirubin, Direct: 0.1 mg/dL (ref 0.0–0.3)

## 2013-05-30 LAB — LIPID PANEL
Cholesterol: 274 mg/dL — ABNORMAL HIGH (ref 0–200)
Total CHOL/HDL Ratio: 3
Triglycerides: 184 mg/dL — ABNORMAL HIGH (ref 0.0–149.0)
VLDL: 36.8 mg/dL (ref 0.0–40.0)

## 2013-05-30 LAB — BASIC METABOLIC PANEL
CO2: 27 mEq/L (ref 19–32)
Chloride: 107 mEq/L (ref 96–112)
GFR: 94.84 mL/min (ref >60.00)
Sodium: 140 mEq/L (ref 135–145)

## 2013-05-30 LAB — LDL CHOLESTEROL, DIRECT: Direct LDL: 147.1 mg/dL

## 2013-05-31 ENCOUNTER — Encounter: Payer: Self-pay | Admitting: Internal Medicine

## 2013-06-01 ENCOUNTER — Other Ambulatory Visit: Payer: Self-pay | Admitting: Internal Medicine

## 2013-06-01 ENCOUNTER — Encounter: Payer: Self-pay | Admitting: Internal Medicine

## 2013-06-01 MED ORDER — PRAVASTATIN SODIUM 20 MG PO TABS: 20.0000 mg | ORAL_TABLET | Freq: Every day | ORAL | Status: DC

## 2013-06-01 NOTE — Progress Notes (Signed)
Order placed for pravastatin 20mg  q day #90 with three refills.

## 2013-06-02 ENCOUNTER — Ambulatory Visit: Payer: Medicare Other | Admitting: Internal Medicine

## 2013-06-03 ENCOUNTER — Other Ambulatory Visit: Payer: Self-pay | Admitting: Internal Medicine

## 2013-06-16 ENCOUNTER — Other Ambulatory Visit: Payer: Self-pay | Admitting: Internal Medicine

## 2013-06-16 NOTE — Telephone Encounter (Signed)
Spoke with pt to clarify whether she was taking 10 mg or 20 mg of Escitalopram. States Dr. Lorin Picket had increased her dose to 20 mg, but pt preferred to stay on the 10 mg dose. Rx sent to pharmacy by escript

## 2013-06-26 ENCOUNTER — Ambulatory Visit (INDEPENDENT_AMBULATORY_CARE_PROVIDER_SITE_OTHER): Payer: Medicare Other | Admitting: Internal Medicine

## 2013-06-26 ENCOUNTER — Telehealth: Payer: Self-pay | Admitting: Internal Medicine

## 2013-06-26 ENCOUNTER — Encounter: Payer: Self-pay | Admitting: Internal Medicine

## 2013-06-26 VITALS — BP 110/80 | HR 74 | Temp 98.1°F | Ht 64.5 in | Wt 162.5 lb

## 2013-06-26 DIAGNOSIS — H409 Unspecified glaucoma: Secondary | ICD-10-CM

## 2013-06-26 DIAGNOSIS — D649 Anemia, unspecified: Secondary | ICD-10-CM

## 2013-06-26 DIAGNOSIS — I10 Essential (primary) hypertension: Secondary | ICD-10-CM | POA: Diagnosis not present

## 2013-06-26 DIAGNOSIS — M129 Arthropathy, unspecified: Secondary | ICD-10-CM

## 2013-06-26 DIAGNOSIS — R32 Unspecified urinary incontinence: Secondary | ICD-10-CM | POA: Diagnosis not present

## 2013-06-26 DIAGNOSIS — R0902 Hypoxemia: Secondary | ICD-10-CM

## 2013-06-26 DIAGNOSIS — E7800 Pure hypercholesterolemia, unspecified: Secondary | ICD-10-CM | POA: Diagnosis not present

## 2013-06-26 DIAGNOSIS — M1990 Unspecified osteoarthritis, unspecified site: Secondary | ICD-10-CM

## 2013-06-26 DIAGNOSIS — K219 Gastro-esophageal reflux disease without esophagitis: Secondary | ICD-10-CM

## 2013-06-26 NOTE — Telephone Encounter (Signed)
Pt aware and will come pick up sample

## 2013-06-26 NOTE — Telephone Encounter (Signed)
Left message for pt to call office   Dr Lorin Picket left her a sample of dymista up front for her

## 2013-06-27 ENCOUNTER — Encounter: Payer: Self-pay | Admitting: Internal Medicine

## 2013-06-27 DIAGNOSIS — R0902 Hypoxemia: Secondary | ICD-10-CM | POA: Insufficient documentation

## 2013-06-27 NOTE — Assessment & Plan Note (Signed)
Recent hgb check 12.4 - normal.  Follow.

## 2013-06-27 NOTE — Assessment & Plan Note (Signed)
Followed by Dr Brasington.    

## 2013-06-27 NOTE — Progress Notes (Signed)
Subjective:    Patient ID: Leah Huber, female    DOB: Aug 25, 1941, 72 y.o.   MRN: [id number]  HPI 72 year old female with past history of hypertension and hypercholesterolemia who comes in today for a scheduled follow up.  Was previously treated with ceftin.  After being on the medicine for a few days, she developed diarrhea.  Was hospitalized 08/25/12-08/27/12 and found to have C. diff colitis.  This was the second hospitalization for C. Diff.  See previous notes for details.  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  Instructed to take Florastor bid.  No abdominal pain or cramping.  No blood in the stool.  Bowels stable now. No diarrhea.  Eating and drinking well.  Staying active.  Detrol is working well for her.  She is seeing Dr Inez Pilgrim for glaucoma.  On eye drops.  He is following closely.  Overall she feels she is doing relatively well.  Is using oxygen at night.  Sleeping better.  Feels more rested in the morning.  She briefly increased her lexapro to 20mg  q day.  Felt this made her too sluggish.  She is now back on 10mg  of lexapro.  Doing well with this.  Overall she feels good.    Past Medical History  Diagnosis Date  . Arthritis   . Depression   . Diverticulosis   . Hypertension   . Hypercholesterolemia   . Colon polyp   . Urinary incontinence   . Abnormal liver function test 12/10  . Hypovolemia 12/10    acute renal failure    Current Outpatient Prescriptions on File Prior to Visit  Medication Sig Dispense Refill  . Bimatoprost (LUMIGAN OP) Apply 1 drop to eye daily. Each eye      . DETROL LA 4 MG 24 hr capsule TAKE 1 CAPSULE DAILY  90 capsule  0  . escitalopram (LEXAPRO) 10 MG tablet TAKE 1 TABLET DAILY  90 tablet  0  . pantoprazole (PROTONIX) 40 MG tablet Take 1 tablet (40 mg total) by mouth 2 (two) times daily.  180 tablet  1  . pravastatin (PRAVACHOL) 20 MG tablet Take 1 tablet (20 mg total) by mouth daily.  90 tablet  3  . valsartan (DIOVAN) 80 MG tablet Take 1  tablet (80 mg total) by mouth daily.  90 tablet  3   No current facility-administered medications on file prior to visit.    Review of Systems Patient denies any headache, lightheadedness or dizziness.  No significant sinus or allergy symptoms.  No chest pain, tightness or palpitations.  No increased shortness of breath, cough or congestion.  No vomiting or nausea.  No significant abdominal pain or cramping.  Bowels doing better.   No BRBPR or melana.   No vaginal symptoms.  Appetite is good.  Using her oxygen at night.  Feels more rested.  Overall she feels she is doing well.         Objective:   Physical Exam  Filed Vitals:   06/26/13 0955  BP: 110/80  Pulse: 74  Temp: 98.1 F (36.7 C)   Blood pressure recheck:  128/78, pulse 60  72 year old female in no acute distress.   HEENT:  Nares - clear.  OP- without lesions or erythema.  NECK:  Supple, nontender.  No audible bruit.   HEART:  Appears to be regular. LUNGS:  Without crackles or wheezing audible.  Respirations even and unlabored.   RADIAL PULSE:  Equal bilaterally.  ABDOMEN:  Soft, nontender.  No tenderness to palpation.  No audible abdominal bruit.  BACK.  No CVA tenderness.  Non tender to palpation.   EXTREMITIES:  No increased edema to be present.                  Assessment & Plan:  FATIGUE. Improved with supplemental oxygen at night.  Feels better.    PREVIOUS C. DIFF.  Resolved.  Bowels stable.      GU.  Doing well on detrol.  Follow.   HEALTH MAINTENANCE.  Physical 07/28/12.  Mammogram 09/27/12 - BiRADS II.

## 2013-06-27 NOTE — Assessment & Plan Note (Signed)
Now on Detrol and doing well.  Follow.   

## 2013-06-27 NOTE — Assessment & Plan Note (Signed)
Stable

## 2013-06-27 NOTE — Assessment & Plan Note (Signed)
On pravastatin.  Low cholesterol diet and exercise.  Follow lipid panel and liver function.    

## 2013-06-27 NOTE — Assessment & Plan Note (Signed)
Continues on protonix.  Currently doing well.  Follow.   

## 2013-06-27 NOTE — Assessment & Plan Note (Signed)
Blood pressure under good control.  Same medication.  Follow metabolic panel.   

## 2013-06-27 NOTE — Assessment & Plan Note (Signed)
Using supplemental oxygen at night.  Feels better.  Follow.

## 2013-07-17 ENCOUNTER — Other Ambulatory Visit: Payer: Self-pay | Admitting: Internal Medicine

## 2013-07-18 ENCOUNTER — Other Ambulatory Visit (INDEPENDENT_AMBULATORY_CARE_PROVIDER_SITE_OTHER): Payer: Medicare Other

## 2013-07-18 DIAGNOSIS — E7800 Pure hypercholesterolemia, unspecified: Secondary | ICD-10-CM | POA: Diagnosis not present

## 2013-07-18 LAB — HEPATIC FUNCTION PANEL
ALT: 16 U/L (ref 0–35)
AST: 23 U/L (ref 0–37)
Albumin: 4.4 g/dL (ref 3.5–5.2)
Alkaline Phosphatase: 45 U/L (ref 39–117)
Bilirubin, Direct: 0 mg/dL (ref 0.0–0.3)
Total Protein: 7.2 g/dL (ref 6.0–8.3)

## 2013-07-19 ENCOUNTER — Encounter: Payer: Self-pay | Admitting: Internal Medicine

## 2013-07-22 ENCOUNTER — Other Ambulatory Visit: Payer: Self-pay | Admitting: Internal Medicine

## 2013-08-14 ENCOUNTER — Other Ambulatory Visit: Payer: Self-pay | Admitting: Internal Medicine

## 2013-08-24 DIAGNOSIS — H4010X0 Unspecified open-angle glaucoma, stage unspecified: Secondary | ICD-10-CM | POA: Diagnosis not present

## 2013-08-27 ENCOUNTER — Other Ambulatory Visit: Payer: Self-pay | Admitting: Internal Medicine

## 2013-10-09 ENCOUNTER — Ambulatory Visit (INDEPENDENT_AMBULATORY_CARE_PROVIDER_SITE_OTHER): Payer: Medicare Other | Admitting: Internal Medicine

## 2013-10-09 ENCOUNTER — Encounter: Payer: Self-pay | Admitting: Internal Medicine

## 2013-10-09 VITALS — BP 130/80 | HR 75 | Temp 98.4°F | Ht 64.0 in | Wt 163.5 lb

## 2013-10-09 DIAGNOSIS — M129 Arthropathy, unspecified: Secondary | ICD-10-CM

## 2013-10-09 DIAGNOSIS — E7800 Pure hypercholesterolemia, unspecified: Secondary | ICD-10-CM

## 2013-10-09 DIAGNOSIS — K219 Gastro-esophageal reflux disease without esophagitis: Secondary | ICD-10-CM

## 2013-10-09 DIAGNOSIS — I10 Essential (primary) hypertension: Secondary | ICD-10-CM | POA: Diagnosis not present

## 2013-10-09 DIAGNOSIS — R198 Other specified symptoms and signs involving the digestive system and abdomen: Secondary | ICD-10-CM | POA: Diagnosis not present

## 2013-10-09 DIAGNOSIS — R32 Unspecified urinary incontinence: Secondary | ICD-10-CM

## 2013-10-09 DIAGNOSIS — R0902 Hypoxemia: Secondary | ICD-10-CM

## 2013-10-09 DIAGNOSIS — H409 Unspecified glaucoma: Secondary | ICD-10-CM

## 2013-10-09 DIAGNOSIS — D649 Anemia, unspecified: Secondary | ICD-10-CM

## 2013-10-09 DIAGNOSIS — M1990 Unspecified osteoarthritis, unspecified site: Secondary | ICD-10-CM

## 2013-10-09 NOTE — Patient Instructions (Signed)
Robitussin DM

## 2013-10-09 NOTE — Progress Notes (Signed)
Pre-visit discussion using our clinic review tool. No additional management support is needed unless otherwise documented below in the visit note.  

## 2013-10-15 ENCOUNTER — Encounter: Payer: Self-pay | Admitting: Internal Medicine

## 2013-10-15 NOTE — Assessment & Plan Note (Signed)
Follow cbc.  

## 2013-10-15 NOTE — Assessment & Plan Note (Signed)
On pravastatin.  Low cholesterol diet and exercise.  Follow lipid panel and liver function.    

## 2013-10-15 NOTE — Assessment & Plan Note (Signed)
Followed by Dr Wallace Going.

## 2013-10-15 NOTE — Progress Notes (Signed)
Subjective:    Patient ID: Leah Huber, female    DOB: 1941/03/23, 73 y.o.   MRN: [id number]  HPI 72 year old female with past history of hypertension and hypercholesterolemia who comes in today to follow up on these issues as well as for a complete physical exam.   Was hospitalized 08/25/12-08/27/12 and found to have C. diff colitis.  This was the second hospitalization for C. Diff.  See previous notes for details.  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  Instructed to take Florastor bid.  No abdominal pain or cramping.  No blood in the stool.  Bowels stable now. No diarrhea.  Eating and drinking well.  Staying active.  Detrol is working well for her.  She is seeing Dr Wallace Going for glaucoma.  On eye drops.  He is following closely.  Overall she feels she is doing relatively well.  Is not using oxygen at night.  Sleeping better.  Feels more rested in the morning.  Overall she feels good.  Some dry cough, but no increased congestion or sob.     Past Medical History  Diagnosis Date  . Arthritis   . Depression   . Diverticulosis   . Hypertension   . Hypercholesterolemia   . Colon polyp   . Urinary incontinence   . Abnormal liver function test 12/10  . Hypovolemia 12/10    acute renal failure    Current Outpatient Prescriptions on File Prior to Visit  Medication Sig Dispense Refill  . Bimatoprost (LUMIGAN OP) Apply 1 drop to eye daily. Each eye      . DETROL LA 4 MG 24 hr capsule TAKE 1 CAPSULE DAILY  90 capsule  3  . DIOVAN 80 MG tablet TAKE 1 TABLET DAILY  90 tablet  1  . escitalopram (LEXAPRO) 10 MG tablet TAKE 1 TABLET DAILY  90 tablet  0  . pantoprazole (PROTONIX) 40 MG tablet Take 1 tablet (40 mg total) by mouth 2 (two) times daily.  180 tablet  1  . pravastatin (PRAVACHOL) 20 MG tablet Take 1 tablet (20 mg total) by mouth daily.  90 tablet  3   No current facility-administered medications on file prior to visit.    Review of Systems Patient denies any headache,  lightheadedness or dizziness.  No sinus pressure.  No chest pain, tightness or palpitations.  No increased shortness of breath.  Some dry cough.  No vomiting or nausea.  No acid reflux.  No significant abdominal pain or cramping.  Bowels doing better.   No BRBPR or melana.   No vaginal symptoms.  Appetite is good.  Not using her oxygen at night now.  Turned in her tank.  Feels rested.  Overall she feels she is doing well.   Some urgency with her bowels.  No blood in her stool.  Last colonoscopy 04/04/09.  Sees Dr Tiffany Kocher.       Objective:   Physical Exam  Filed Vitals:   10/09/13 1554  BP: 130/80  Pulse: 75  Temp: 98.4 F (47.44 C)   73 year old female in no acute distress.   HEENT:  Nares- clear.  Oropharynx - without lesions. NECK:  Supple.  Nontender.  No audible bruit.  HEART:  Appears to be regular. LUNGS:  No crackles or wheezing audible.  Respirations even and unlabored.  RADIAL PULSE:  Equal bilaterally.    BREASTS:  No nipple discharge or nipple retraction present.  Could not appreciate any distinct nodules or axillary  adenopathy.  ABDOMEN:  Soft, nontender.  Bowel sounds present and normal.  No audible abdominal bruit.  GU:  Not performed.   EXTREMITIES:  No increased edema present.  DP pulses palpable and equal bilaterally.          Assessment & Plan:  PREVIOUS C. DIFF.  Resolved.  With some urgency at times.  States had last colonoscopy 2010.  States due.  Will refer back to GI (Dr Tiffany Kocher) for evaluation.    COUGH.  Dry cough.  Lungs clear. Robitussin as directed.  Saline flushes if congestion.  Follow.       GU.  Doing well on detrol.  Follow.   HEALTH MAINTENANCE.  Physical today.  Mammogram 09/27/12 - BiRADS II.  Schedule follow up mammogram.

## 2013-10-15 NOTE — Assessment & Plan Note (Signed)
Stable

## 2013-10-15 NOTE — Assessment & Plan Note (Signed)
Blood pressure under good control.  Same medication.  Follow metabolic panel.   

## 2013-10-15 NOTE — Assessment & Plan Note (Signed)
Was using supplemental oxygen at night.  Stopped using.  Turned in her tank.  Feels good.  Desires not to restart or be retested.

## 2013-10-15 NOTE — Assessment & Plan Note (Signed)
Now on Detrol and doing well.  Follow.

## 2013-10-15 NOTE — Assessment & Plan Note (Signed)
Continues on protonix.  Currently doing well.  Follow.   

## 2013-10-16 ENCOUNTER — Emergency Department: Payer: Self-pay | Admitting: Emergency Medicine

## 2013-10-16 DIAGNOSIS — M5137 Other intervertebral disc degeneration, lumbosacral region: Secondary | ICD-10-CM | POA: Diagnosis not present

## 2013-10-16 DIAGNOSIS — R55 Syncope and collapse: Secondary | ICD-10-CM | POA: Diagnosis not present

## 2013-10-16 DIAGNOSIS — H05239 Hemorrhage of unspecified orbit: Secondary | ICD-10-CM | POA: Diagnosis not present

## 2013-10-16 DIAGNOSIS — M47817 Spondylosis without myelopathy or radiculopathy, lumbosacral region: Secondary | ICD-10-CM | POA: Diagnosis not present

## 2013-10-16 LAB — ETHANOL
Ethanol %: 0.226 % — ABNORMAL HIGH (ref 0.000–0.080)
Ethanol: 226 mg/dL

## 2013-10-16 LAB — BASIC METABOLIC PANEL
Anion Gap: 7 (ref 7–16)
BUN: 21 mg/dL — ABNORMAL HIGH (ref 7–18)
CHLORIDE: 103 mmol/L (ref 98–107)
CO2: 25 mmol/L (ref 21–32)
CREATININE: 0.83 mg/dL (ref 0.60–1.30)
Calcium, Total: 9.4 mg/dL (ref 8.5–10.1)
EGFR (Non-African Amer.): >60
Glucose: 86 mg/dL (ref 65–99)
Osmolality: 272 (ref 275–301)
Potassium: 3.8 mmol/L (ref 3.5–5.1)
Sodium: 135 mmol/L — ABNORMAL LOW (ref 136–145)

## 2013-10-16 LAB — PROTIME-INR
INR: 1
Prothrombin Time: 13.5 secs (ref 11.5–14.7)

## 2013-10-17 ENCOUNTER — Telehealth: Payer: Self-pay | Admitting: Internal Medicine

## 2013-10-17 LAB — CBC WITH DIFFERENTIAL/PLATELET
BASOS PCT: 1.3 %
Basophil #: 0.1 10*3/uL (ref 0.0–0.1)
Eosinophil #: 0.4 10*3/uL (ref 0.0–0.7)
Eosinophil %: 4.3 %
HCT: 38 % (ref 35.0–47.0)
HGB: 12.8 g/dL (ref 12.0–16.0)
Lymphocyte #: 4.7 10*3/uL — ABNORMAL HIGH (ref 1.0–3.6)
Lymphocyte %: 54.3 %
MCH: 33.4 pg (ref 26.0–34.0)
MCHC: 33.6 g/dL (ref 32.0–36.0)
MCV: 99 fL (ref 80–100)
MONO ABS: 0.5 x10 3/mm (ref 0.2–0.9)
MONOS PCT: 6.3 %
Neutrophil #: 2.9 10*3/uL (ref 1.4–6.5)
Neutrophil %: 33.8 %
Platelet: 306 10*3/uL (ref 150–440)
RBC: 3.83 10*6/uL (ref 3.80–5.20)
RDW: 13.3 % (ref 11.5–14.5)
WBC: 8.6 10*3/uL (ref 3.6–11.0)

## 2013-10-17 NOTE — Telephone Encounter (Signed)
Leah Huber-Please call pt to scheduled appt. ER records requested.

## 2013-10-17 NOTE — Telephone Encounter (Signed)
I can work her in at the end of the day on 10/19/13.  (4:15) .  Notify adding on - may have to wait.  Need er records.

## 2013-10-17 NOTE — Telephone Encounter (Signed)
The patient fell , she was seen in the ER last night . She has a contusion on the left side of her head. She was told to follow up with her primary in the next few days. Her daughter is needing a late day appointment to bring her mother. Please advise.

## 2013-10-18 NOTE — Telephone Encounter (Signed)
The patient has been scheduled

## 2013-10-19 ENCOUNTER — Ambulatory Visit (INDEPENDENT_AMBULATORY_CARE_PROVIDER_SITE_OTHER): Payer: Medicare Other | Admitting: Internal Medicine

## 2013-10-19 ENCOUNTER — Encounter: Payer: Self-pay | Admitting: Internal Medicine

## 2013-10-19 VITALS — BP 120/80 | HR 80 | Temp 98.1°F | Ht 64.0 in | Wt 163.5 lb

## 2013-10-19 DIAGNOSIS — W19XXXA Unspecified fall, initial encounter: Secondary | ICD-10-CM

## 2013-10-19 DIAGNOSIS — I10 Essential (primary) hypertension: Secondary | ICD-10-CM

## 2013-10-19 DIAGNOSIS — F1010 Alcohol abuse, uncomplicated: Secondary | ICD-10-CM

## 2013-10-22 ENCOUNTER — Encounter: Payer: Self-pay | Admitting: Internal Medicine

## 2013-10-22 DIAGNOSIS — W19XXXA Unspecified fall, initial encounter: Secondary | ICD-10-CM | POA: Insufficient documentation

## 2013-10-22 DIAGNOSIS — F1010 Alcohol abuse, uncomplicated: Secondary | ICD-10-CM | POA: Insufficient documentation

## 2013-10-22 NOTE — Progress Notes (Signed)
  Subjective:    Patient ID: Leah Huber, female    DOB: [birth year], 73 y.o.   MRN: [id number]  HPI 73 year old female with past history of hypertension and hypercholesterolemia who comes in today as a work in for an ER follow up.   Had a recent fall.  Was seen in the ER.  Hit her head.  Had a high alcohol intake.  She did not hurt herself in any other areas.  No hip pain.  No back or abdominal pain.  Large hematoma over her left eye.  No dizziness.  No headache.       Past Medical History  Diagnosis Date  . Arthritis   . Depression   . Diverticulosis   . Hypertension   . Hypercholesterolemia   . Colon polyp   . Urinary incontinence   . Abnormal liver function test 12/10  . Hypovolemia 12/10    acute renal failure    Current Outpatient Prescriptions on File Prior to Visit  Medication Sig Dispense Refill  . Bimatoprost (LUMIGAN OP) Apply 1 drop to eye daily. Each eye      . DETROL LA 4 MG 24 hr capsule TAKE 1 CAPSULE DAILY  90 capsule  3  . DIOVAN 80 MG tablet TAKE 1 TABLET DAILY  90 tablet  1  . escitalopram (LEXAPRO) 10 MG tablet TAKE 1 TABLET DAILY  90 tablet  0  . pantoprazole (PROTONIX) 40 MG tablet Take 1 tablet (40 mg total) by mouth 2 (two) times daily.  180 tablet  1  . pravastatin (PRAVACHOL) 20 MG tablet Take 1 tablet (20 mg total) by mouth daily.  90 tablet  3   No current facility-administered medications on file prior to visit.    Review of Systems Patient denies any headache, lightheadedness or dizziness.  Does have soreness over the hematoma (over her left eye). No chest pain, tightness or palpitations.  No increased shortness of breath.  No vomiting or nausea.  No acid reflux.  No  abdominal pain or cramping.  No hematuria.  Previous increased alcohol intake.  We discussed that she cannot drink and take her citalopram.  Also dicussed not drinking in general.       Objective:   Physical Exam  Filed Vitals:   10/19/13 1648  BP: 120/80  Pulse: 80  Temp: 98.1  F (21.60 C)   73 year old female in no acute distress.   HEENT:  Nares- clear.  Oropharynx - without lesions.  Large hematoma over left eye.  Minimal tenderness over the hematoma.   NECK:  Supple.  Nontender.  HEART:  Appears to be regular. LUNGS:  No crackles or wheezing audible.  Respirations even and unlabored.  RADIAL PULSE:  Equal bilaterally.   ABDOMEN:  Soft, nontender.  Bowel sounds present and normal.  No audible abdominal bruit.  MSK:  Able to sit and stand without difficulty.  No pain weight bearing.   SKIN:  Small areas of bruising upper thighs and arms.           Assessment & Plan:  HEALTH MAINTENANCE.  Physical last visit.  Mammogram 09/27/12 - BiRADS II.  Scheduled follow up mammogram.    I spent 25 minutes with the patient and more than 50% of the time was spent in consultation regarding the above.

## 2013-10-22 NOTE — Assessment & Plan Note (Signed)
Found to have increased alcohol level s/p fall.  Discussed the importance of not drinking.  She has no plans to return to drinking like she was.  Follow.

## 2013-10-22 NOTE — Assessment & Plan Note (Signed)
Blood pressure under good control.  Same medication.  Follow metabolic panel.   

## 2013-10-22 NOTE — Assessment & Plan Note (Signed)
S/p fall with resulting large hematoma over her left eye.  Will gradually reabsorb.  No headache or dizziness.  Follow.

## 2013-10-23 DIAGNOSIS — T1490XA Injury, unspecified, initial encounter: Secondary | ICD-10-CM | POA: Diagnosis not present

## 2013-10-23 DIAGNOSIS — R234 Changes in skin texture: Secondary | ICD-10-CM | POA: Diagnosis not present

## 2013-10-23 DIAGNOSIS — D692 Other nonthrombocytopenic purpura: Secondary | ICD-10-CM | POA: Diagnosis not present

## 2013-10-24 ENCOUNTER — Encounter: Payer: Self-pay | Admitting: Internal Medicine

## 2013-11-08 ENCOUNTER — Other Ambulatory Visit: Payer: Self-pay | Admitting: Internal Medicine

## 2013-11-13 DIAGNOSIS — R197 Diarrhea, unspecified: Secondary | ICD-10-CM | POA: Diagnosis not present

## 2013-11-13 DIAGNOSIS — Z86010 Personal history of colonic polyps: Secondary | ICD-10-CM | POA: Diagnosis not present

## 2013-11-17 IMAGING — CT CT HEAD WITHOUT CONTRAST
4 of 8 series · 8 of 30 positions shown, 9 images · non-contrast
Comparison: none

REASON FOR EXAM: altered mental status
COMMENTS:

PROCEDURE:     CT  - CT HEAD WITHOUT CONTRAST  - May 19, 2012  [DATE]
RESULT:     Comparison:  None
TECHNIQUE: Multiple axial images from the foramen magnum to the vertex were
obtained without IV contrast.

[Series 4: without · axial · non-contrast · 0.39mm/px · z∈[-70,-20]mm · 2 of 31 slices shown]
[im 11/31  brain]
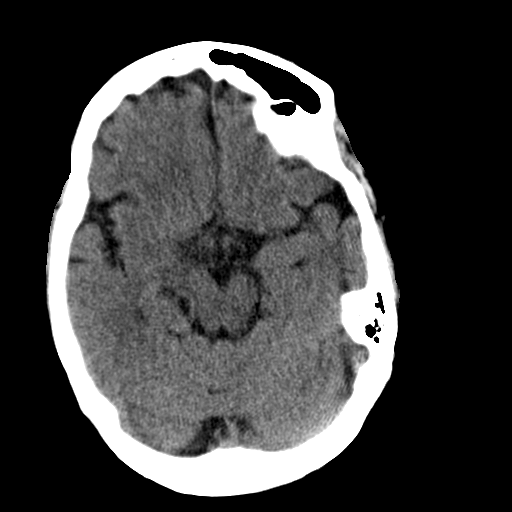
[im 21/31  brain]
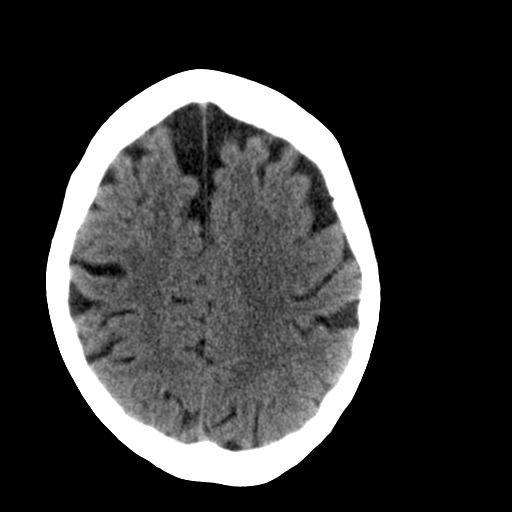

[Series 6: without 2 · axial · non-contrast · 0.38mm/px · z∈[-89,-35]mm · 2 of 35 slices shown, 3 images (1 of 2)]
[im 12/35  brain]
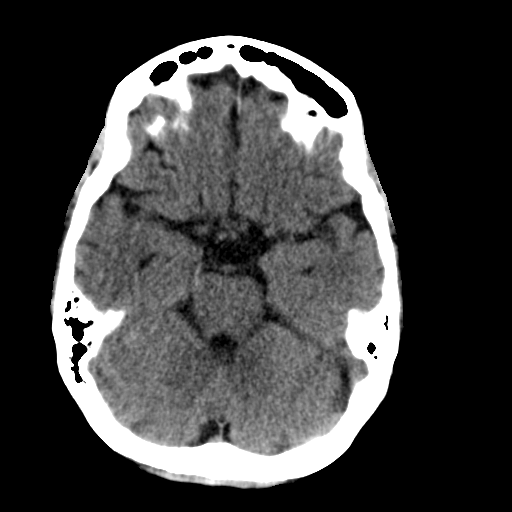
[im 12/35  bone]
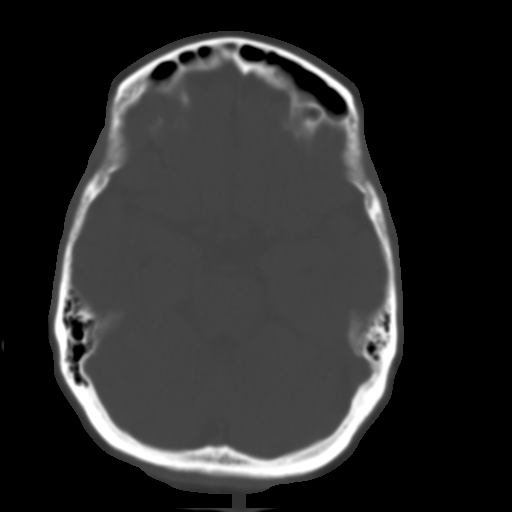
[im 23/35  brain]
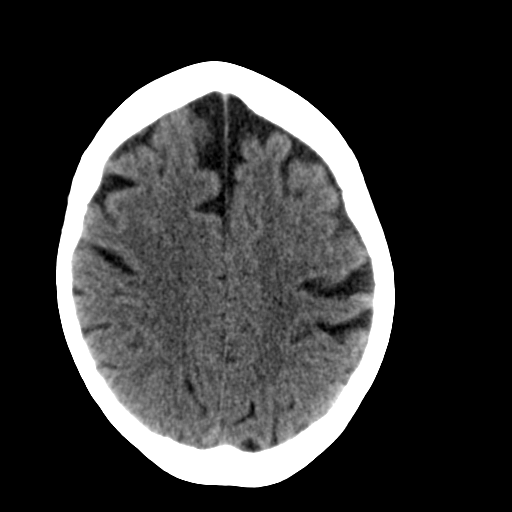

[Series 7: bone 2 · axial · 0.38mm/px · z∈[-85,-30]mm · 2 of 34 slices shown]
[im 12/34  bone]
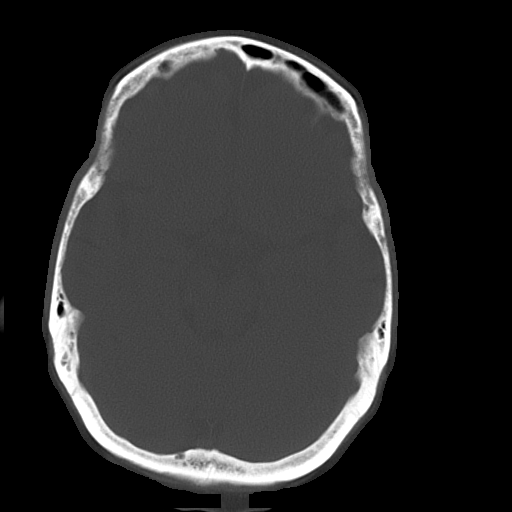
[im 23/34  bone]
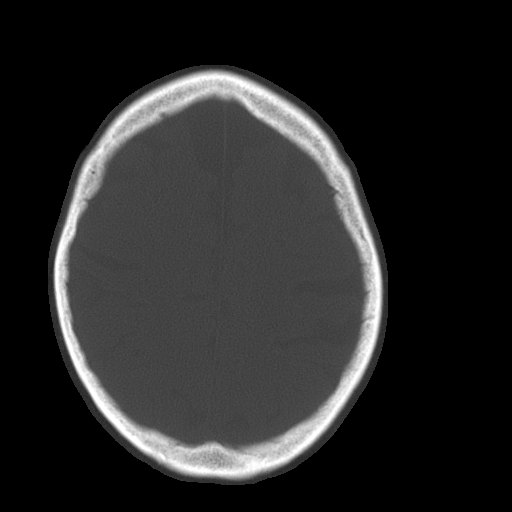

[Series 8: without 2 · axial · non-contrast · 0.39mm/px · z∈[-87,-33]mm · 2 of 34 slices shown (2 of 2)]
[im 12/34  brain]
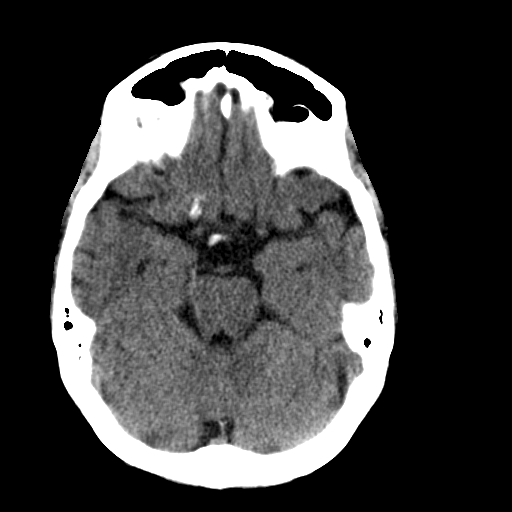
[im 23/34  brain]
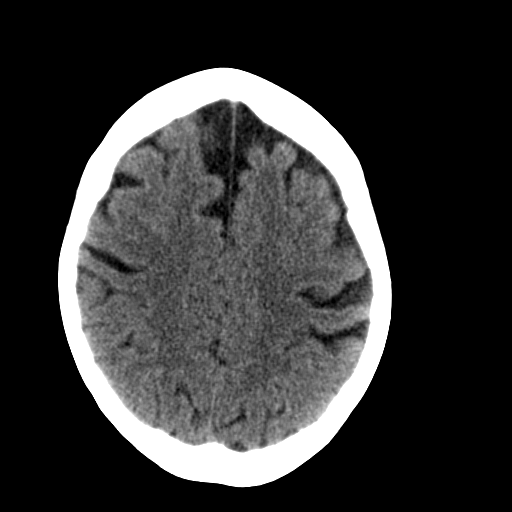

[8 of 30 positions shown; findings below may reference images not displayed]

FINDINGS: There is no evidence of mass effect, midline shift, or extra-axial fluid
collections.  There is no evidence of a space-occupying lesion or
intracranial hemorrhage. There is no evidence of a cortical-based area of
acute infarction. There is generalized cerebral atrophy. There is
periventricular white matter low attenuation likely secondary to
microangiopathy.

The ventricles and sulci are appropriate for the patient's age. The basal
cisterns are patent.

Visualized portions of the orbits are unremarkable. There are bilateral
small maxillary sinus air-fluid levels. Cerebrovascular atherosclerotic
calcifications are noted.

The osseous structures are unremarkable.
IMPRESSION: No acute intracranial process.

Bilateral maxillary sinus disease.

CT can underestimate ischemia in the first 24 hours after the event. If
there is clinical concern for an acute infarct, a followup MRI or repeat CT
scan in 24 hours may provide additional information.

[REDACTED]

## 2013-11-24 ENCOUNTER — Encounter: Payer: Self-pay | Admitting: *Deleted

## 2013-11-24 DIAGNOSIS — IMO0002 Reserved for concepts with insufficient information to code with codable children: Secondary | ICD-10-CM | POA: Diagnosis not present

## 2013-11-24 DIAGNOSIS — M1710 Unilateral primary osteoarthritis, unspecified knee: Secondary | ICD-10-CM | POA: Diagnosis not present

## 2013-11-27 ENCOUNTER — Encounter: Payer: Self-pay | Admitting: Internal Medicine

## 2013-11-27 ENCOUNTER — Encounter: Payer: Self-pay | Admitting: *Deleted

## 2013-11-27 ENCOUNTER — Ambulatory Visit (INDEPENDENT_AMBULATORY_CARE_PROVIDER_SITE_OTHER): Payer: Medicare Other | Admitting: Internal Medicine

## 2013-11-27 ENCOUNTER — Ambulatory Visit: Payer: Self-pay | Admitting: Internal Medicine

## 2013-11-27 VITALS — BP 130/70 | HR 75 | Temp 98.5°F | Ht 64.0 in | Wt 164.2 lb

## 2013-11-27 DIAGNOSIS — I10 Essential (primary) hypertension: Secondary | ICD-10-CM | POA: Diagnosis not present

## 2013-11-27 DIAGNOSIS — M1990 Unspecified osteoarthritis, unspecified site: Secondary | ICD-10-CM

## 2013-11-27 DIAGNOSIS — R0902 Hypoxemia: Secondary | ICD-10-CM

## 2013-11-27 DIAGNOSIS — M129 Arthropathy, unspecified: Secondary | ICD-10-CM | POA: Diagnosis not present

## 2013-11-27 DIAGNOSIS — E7800 Pure hypercholesterolemia, unspecified: Secondary | ICD-10-CM | POA: Diagnosis not present

## 2013-11-27 DIAGNOSIS — Z733 Stress, not elsewhere classified: Secondary | ICD-10-CM

## 2013-11-27 DIAGNOSIS — D649 Anemia, unspecified: Secondary | ICD-10-CM

## 2013-11-27 DIAGNOSIS — Z1231 Encounter for screening mammogram for malignant neoplasm of breast: Secondary | ICD-10-CM | POA: Diagnosis not present

## 2013-11-27 DIAGNOSIS — K219 Gastro-esophageal reflux disease without esophagitis: Secondary | ICD-10-CM

## 2013-11-27 DIAGNOSIS — W19XXXA Unspecified fall, initial encounter: Secondary | ICD-10-CM

## 2013-11-27 DIAGNOSIS — F1010 Alcohol abuse, uncomplicated: Secondary | ICD-10-CM

## 2013-11-27 DIAGNOSIS — F439 Reaction to severe stress, unspecified: Secondary | ICD-10-CM

## 2013-11-27 LAB — HM MAMMOGRAPHY: HM Mammogram: NEGATIVE

## 2013-11-27 NOTE — Patient Instructions (Signed)
Taper lexapro to one tablet alternating with 1/2 tablet every other day.  Do this for 3-4 weeks.  If ok, then take 1/2 tablet every day for 3-4 weeks.  If ok, then take 1/2 tablet every other day for 3-4 weeks.  Then, two 1/2  tablets per week for two weeks and then off.

## 2013-11-27 NOTE — Assessment & Plan Note (Addendum)
Blood pressure under good control.  Same medication.  Follow metabolic panel.   

## 2013-11-27 NOTE — Progress Notes (Signed)
Subjective:    Patient ID: Leah Huber, female    DOB: [birth year], 73 y.o.   MRN: [id number]  HPI 73 year old female with past history of hypertension and hypercholesterolemia who comes in today for a scheduled follow up.   Had a recent fall.  Was seen in the ER.  Hit her head.  Had a high alcohol intake.  Had a large hematoma over her left eye.  No dizziness.  No headache.  The hematoma has reabsorbed.  No residual problems.  Staying active.  No cardiac symptoms with increased activity or exertion.  Breathing stable.  Feels good.  Has stopped drinking alcohol.  Completely off now.  Does not feel as depressed.  Wants to taper her lexapro.     Past Medical History  Diagnosis Date  . Arthritis   . Depression   . Diverticulosis   . Hypertension   . Hypercholesterolemia   . Colon polyp   . Urinary incontinence   . Abnormal liver function test 12/10  . Hypovolemia 12/10    acute renal failure    Current Outpatient Prescriptions on File Prior to Visit  Medication Sig Dispense Refill  . acetaminophen (TYLENOL) 325 MG tablet Take 650 mg by mouth every 6 (six) hours as needed.      . Bimatoprost (LUMIGAN OP) Apply 1 drop to eye daily. Each eye      . DETROL LA 4 MG 24 hr capsule TAKE 1 CAPSULE DAILY  90 capsule  3  . DIOVAN 80 MG tablet TAKE 1 TABLET DAILY  90 tablet  1  . escitalopram (LEXAPRO) 10 MG tablet TAKE 1 TABLET DAILY  90 tablet  0  . pantoprazole (PROTONIX) 40 MG tablet Take 1 tablet (40 mg total) by mouth 2 (two) times daily.  180 tablet  1  . pravastatin (PRAVACHOL) 20 MG tablet Take 1 tablet (20 mg total) by mouth daily.  90 tablet  3   No current facility-administered medications on file prior to visit.    Review of Systems Patient denies any headache, lightheadedness or dizziness.   Hematoma reabsorbed.  No chest pain, tightness or palpitations.  No increased shortness of breath, cough or congestion.  No nausea or vomiting.  No acid reflux.  No abdominal pain or cramping.   No bowel change, such as diarrhea, constipation, BRBPR or melana.  Bowels more formed.  No urine change.  Has stopped drinking.  Feels good.  Does not feel as depressed.  Wants to come off her lexapro.  Blood pressures have been doing well.  States averaging 120s/70s.        Objective:   Physical Exam  Filed Vitals:   11/27/13 1358  BP: 130/70  Pulse: 75  Temp: 98.5 F (44.76 C)   73 year old female in no acute distress.   HEENT:  Nares- clear.  Oropharynx - without lesions.  No tenderness to palpation over her left orbit.     NECK:  Supple.  Nontender.  HEART:  Appears to be regular. LUNGS:  No crackles or wheezing audible.  Respirations even and unlabored.  RADIAL PULSE:  Equal bilaterally.   ABDOMEN:  Soft, nontender.  Bowel sounds present and normal.  No audible abdominal bruit.  EXTREMITIES:  No increased edema present.          Assessment & Plan:  HEALTH MAINTENANCE.  Physical 10/09/13.  Mammogram 09/27/12 - BiRADS II.  Scheduled follow up mammogram today.  Planning for colonoscopy on 01/19/14.  I spent 25 minutes with the patient and more than 50% of the time was spent in consultation regarding the above.

## 2013-11-27 NOTE — Progress Notes (Signed)
Pre-visit discussion using our clinic review tool. No additional management support is needed unless otherwise documented below in the visit note.  

## 2013-11-29 ENCOUNTER — Encounter: Payer: Self-pay | Admitting: Internal Medicine

## 2013-11-29 DIAGNOSIS — Z79899 Other long term (current) drug therapy: Secondary | ICD-10-CM | POA: Diagnosis not present

## 2013-12-02 ENCOUNTER — Encounter: Payer: Self-pay | Admitting: Internal Medicine

## 2013-12-02 DIAGNOSIS — F439 Reaction to severe stress, unspecified: Secondary | ICD-10-CM | POA: Insufficient documentation

## 2013-12-02 NOTE — Assessment & Plan Note (Signed)
Handling things well.  Not as depressed.  Feels good.  Wants to taper Lexapro.  Tapering schedule given.  Follow.

## 2013-12-02 NOTE — Assessment & Plan Note (Signed)
S/p fall with resulting large hematoma over her left eye.  No headache or dizziness.  Hematoma reabsorbed.  Follow.

## 2013-12-02 NOTE — Assessment & Plan Note (Signed)
Follow cbc.  

## 2013-12-02 NOTE — Assessment & Plan Note (Signed)
On pravastatin.  Low cholesterol diet and exercise.  Follow lipid panel and liver function.    

## 2013-12-02 NOTE — Assessment & Plan Note (Signed)
Has stopped drinking.  Follow.

## 2013-12-02 NOTE — Assessment & Plan Note (Addendum)
Stable.  Saw Dr Rudene Christians recently.  Xray knee - ok.  Follow.

## 2013-12-02 NOTE — Assessment & Plan Note (Signed)
Continues on protonix.  Currently doing well.  Follow.   

## 2013-12-02 NOTE — Assessment & Plan Note (Signed)
Was using supplemental oxygen at night.  Stopped using.  Turned in her tank.  Feels good.  Desires not to restart or be retested.

## 2013-12-11 ENCOUNTER — Other Ambulatory Visit (INDEPENDENT_AMBULATORY_CARE_PROVIDER_SITE_OTHER): Payer: Medicare Other

## 2013-12-11 DIAGNOSIS — I10 Essential (primary) hypertension: Secondary | ICD-10-CM | POA: Diagnosis not present

## 2013-12-11 DIAGNOSIS — E7800 Pure hypercholesterolemia, unspecified: Secondary | ICD-10-CM

## 2013-12-11 LAB — LIPID PANEL
Cholesterol: 242 mg/dL — ABNORMAL HIGH (ref 0–200)
HDL: 71.2 mg/dL (ref >39.00)
LDL CALC: 141 mg/dL — AB (ref 0–99)
TRIGLYCERIDES: 148 mg/dL (ref 0.0–149.0)
Total CHOL/HDL Ratio: 3
VLDL: 29.6 mg/dL (ref 0.0–40.0)

## 2013-12-11 LAB — HEPATIC FUNCTION PANEL
ALT: 15 U/L (ref 0–35)
AST: 24 U/L (ref 0–37)
Albumin: 4.2 g/dL (ref 3.5–5.2)
Alkaline Phosphatase: 50 U/L (ref 39–117)
Bilirubin, Direct: 0.1 mg/dL (ref 0.0–0.3)
TOTAL PROTEIN: 6.8 g/dL (ref 6.0–8.3)
Total Bilirubin: 0.6 mg/dL (ref 0.3–1.2)

## 2013-12-11 LAB — BASIC METABOLIC PANEL
BUN: 24 mg/dL — ABNORMAL HIGH (ref 6–23)
CHLORIDE: 104 meq/L (ref 96–112)
CO2: 26 mEq/L (ref 19–32)
CREATININE: 0.7 mg/dL (ref 0.4–1.2)
Calcium: 9.6 mg/dL (ref 8.4–10.5)
GFR: 82.82 mL/min (ref >60.00)
Glucose, Bld: 94 mg/dL (ref 70–99)
POTASSIUM: 4.6 meq/L (ref 3.5–5.1)
Sodium: 139 mEq/L (ref 135–145)

## 2013-12-15 ENCOUNTER — Other Ambulatory Visit: Payer: Self-pay | Admitting: Internal Medicine

## 2013-12-16 ENCOUNTER — Encounter: Payer: Self-pay | Admitting: Internal Medicine

## 2013-12-18 NOTE — Telephone Encounter (Signed)
Mailed unread message to pt  

## 2013-12-20 ENCOUNTER — Other Ambulatory Visit: Payer: Self-pay | Admitting: Internal Medicine

## 2013-12-23 ENCOUNTER — Encounter: Payer: Self-pay | Admitting: Internal Medicine

## 2013-12-27 ENCOUNTER — Encounter: Payer: Self-pay | Admitting: Internal Medicine

## 2014-01-08 ENCOUNTER — Encounter: Payer: Self-pay | Admitting: Internal Medicine

## 2014-01-19 ENCOUNTER — Ambulatory Visit: Payer: Self-pay | Admitting: Unknown Physician Specialty

## 2014-01-19 DIAGNOSIS — Z860100 Personal history of colon polyps, unspecified: Secondary | ICD-10-CM | POA: Diagnosis not present

## 2014-01-19 DIAGNOSIS — K648 Other hemorrhoids: Secondary | ICD-10-CM | POA: Diagnosis not present

## 2014-01-19 DIAGNOSIS — I10 Essential (primary) hypertension: Secondary | ICD-10-CM | POA: Diagnosis not present

## 2014-01-19 DIAGNOSIS — Z87891 Personal history of nicotine dependence: Secondary | ICD-10-CM | POA: Diagnosis not present

## 2014-01-19 DIAGNOSIS — R197 Diarrhea, unspecified: Secondary | ICD-10-CM | POA: Diagnosis not present

## 2014-01-19 DIAGNOSIS — Z79899 Other long term (current) drug therapy: Secondary | ICD-10-CM | POA: Diagnosis not present

## 2014-01-19 DIAGNOSIS — K5730 Diverticulosis of large intestine without perforation or abscess without bleeding: Secondary | ICD-10-CM | POA: Diagnosis not present

## 2014-01-19 DIAGNOSIS — Z888 Allergy status to other drugs, medicaments and biological substances status: Secondary | ICD-10-CM | POA: Diagnosis not present

## 2014-01-19 DIAGNOSIS — Z882 Allergy status to sulfonamides status: Secondary | ICD-10-CM | POA: Diagnosis not present

## 2014-01-19 LAB — HM COLONOSCOPY

## 2014-01-22 LAB — PATHOLOGY REPORT

## 2014-01-30 ENCOUNTER — Encounter: Payer: Self-pay | Admitting: Internal Medicine

## 2014-01-31 ENCOUNTER — Encounter: Payer: Self-pay | Admitting: Internal Medicine

## 2014-01-31 DIAGNOSIS — Z86010 Personal history of colonic polyps: Secondary | ICD-10-CM | POA: Insufficient documentation

## 2014-02-25 ENCOUNTER — Other Ambulatory Visit: Payer: Self-pay | Admitting: Internal Medicine

## 2014-03-25 ENCOUNTER — Encounter: Payer: Self-pay | Admitting: Internal Medicine

## 2014-03-25 DIAGNOSIS — Z86010 Personal history of colonic polyps: Secondary | ICD-10-CM

## 2014-03-29 ENCOUNTER — Encounter: Payer: Self-pay | Admitting: Internal Medicine

## 2014-03-29 ENCOUNTER — Ambulatory Visit (INDEPENDENT_AMBULATORY_CARE_PROVIDER_SITE_OTHER): Payer: Medicare Other | Admitting: Internal Medicine

## 2014-03-29 VITALS — BP 118/78 | HR 97 | Temp 98.7°F | Ht 64.0 in | Wt 159.8 lb

## 2014-03-29 DIAGNOSIS — Z860100 Personal history of colon polyps, unspecified: Secondary | ICD-10-CM

## 2014-03-29 DIAGNOSIS — I10 Essential (primary) hypertension: Secondary | ICD-10-CM

## 2014-03-29 DIAGNOSIS — J069 Acute upper respiratory infection, unspecified: Secondary | ICD-10-CM

## 2014-03-29 DIAGNOSIS — F1010 Alcohol abuse, uncomplicated: Secondary | ICD-10-CM

## 2014-03-29 DIAGNOSIS — K219 Gastro-esophageal reflux disease without esophagitis: Secondary | ICD-10-CM

## 2014-03-29 DIAGNOSIS — E7800 Pure hypercholesterolemia, unspecified: Secondary | ICD-10-CM

## 2014-03-29 MED ORDER — BENZONATATE 100 MG PO CAPS: 100.0000 mg | ORAL_CAPSULE | Freq: Three times a day (TID) | ORAL | Status: DC | PRN

## 2014-03-29 NOTE — Patient Instructions (Signed)
Saline nasal spray - flush nose at least 2-3x/day.   Nasacort nasal spray - 2 sprays each nostril one time per day.  Do this in the evening.    mucinex DM in the am and Robitussin DM in the evening.  Tessalon perles if needed for cough (prescription)

## 2014-03-29 NOTE — Progress Notes (Signed)
Pre visit review using our clinic review tool, if applicable. No additional management support is needed unless otherwise documented below in the visit note. 

## 2014-03-31 DIAGNOSIS — R05 Cough: Secondary | ICD-10-CM | POA: Diagnosis not present

## 2014-03-31 DIAGNOSIS — R059 Cough, unspecified: Secondary | ICD-10-CM | POA: Diagnosis not present

## 2014-04-01 ENCOUNTER — Encounter: Payer: Self-pay | Admitting: Internal Medicine

## 2014-04-01 DIAGNOSIS — J069 Acute upper respiratory infection, unspecified: Secondary | ICD-10-CM | POA: Insufficient documentation

## 2014-04-01 NOTE — Assessment & Plan Note (Signed)
Continues on protonix.  Currently doing well.  Follow.

## 2014-04-01 NOTE — Assessment & Plan Note (Signed)
Colonoscopy as outlined.  Recommended f/u colonoscopy 01/2019.

## 2014-04-01 NOTE — Assessment & Plan Note (Signed)
On pravastatin.  Low cholesterol diet and exercise.  Follow lipid panel and liver function.    

## 2014-04-01 NOTE — Assessment & Plan Note (Signed)
Reports congestion and cough as outlined.  Continue robitussin DM as directed.  Saline nasal spray and nasacort as directed.  Hold abx.  Follow.

## 2014-04-01 NOTE — Assessment & Plan Note (Signed)
Discussed again today with her regarding the importance of remaining off EtOH.

## 2014-04-01 NOTE — Assessment & Plan Note (Signed)
Blood pressure under good control.  Same medication.  Follow metabolic panel.

## 2014-04-01 NOTE — Progress Notes (Signed)
  Subjective:    Patient ID: Leah Huber, female    DOB: 12-12-1940, 73 y.o.   MRN: [id number]  HPI 73 year old female with past history of hypertension and hypercholesterolemia who comes in today for a scheduled follow up.   Has been doing well.  Does report some increased sinus pressure and runny nose.  Some sore throat.  Some increased cough.  No sob.  Taking robitussin DM.  Staying hydrated.  No nausea or vomiting.  No bowel change.  Only drinks now if she has dinner with a friend or family.   Only on rare occasions.  Just had f/u colonoscopy.      Past Medical History  Diagnosis Date  . Arthritis   . Depression   . Diverticulosis   . Hypertension   . Hypercholesterolemia   . Colon polyp   . Urinary incontinence   . Abnormal liver function test 12/10  . Hypovolemia 12/10    acute renal failure    Current Outpatient Prescriptions on File Prior to Visit  Medication Sig Dispense Refill  . acetaminophen (TYLENOL) 325 MG tablet Take 650 mg by mouth every 6 (six) hours as needed.      . Bimatoprost (LUMIGAN OP) Apply 1 drop to eye daily. Each eye      . DETROL LA 4 MG 24 hr capsule TAKE 1 CAPSULE DAILY  90 capsule  3  . DIOVAN 80 MG tablet TAKE 1 TABLET DAILY  90 tablet  1  . pravastatin (PRAVACHOL) 20 MG tablet Take 1 tablet (20 mg total) by mouth daily.  90 tablet  3  . PROTONIX 40 MG tablet TAKE 1 TABLET TWICE A DAY  180 tablet  1   No current facility-administered medications on file prior to visit.    Review of Systems Patient denies any headache, lightheadedness or dizziness.   Increased sinus congestion and cough as outlined.  Hematoma reabsorbed.  No chest pain, tightness or palpitations.  No increased shortness of breath, cough or congestion.  No nausea or vomiting.  No acid reflux.  No abdominal pain or cramping.  No bowel change, such as diarrhea, constipation, BRBPR or melana.  No urine change.  Feels good.  Blood pressures have been doing well.       Objective:   Physical Exam  Filed Vitals:   03/29/14 1435  BP: 118/78  Pulse: 97  Temp: 98.7 F (37.1 C)   Blood pressure recheck:  136/78, pulse 30  73 year old female in no acute distress.   HEENT:  Nares- clear.  Oropharynx - without lesions.  No tenderness to palpation over her left orbit.     NECK:  Supple.  Nontender.  HEART:  Appears to be regular. LUNGS:  No crackles or wheezing audible.  Respirations even and unlabored.  RADIAL PULSE:  Equal bilaterally.   ABDOMEN:  Soft, nontender.  Bowel sounds present and normal.  No audible abdominal bruit.  EXTREMITIES:  No increased edema present.          Assessment & Plan:  HEALTH MAINTENANCE.  Physical 10/09/13.  Mammogram 11/27/13 - BiRADS I.  Had colonoscopy 01/19/14.  Recommended f/u colonoscopy in 2020.

## 2014-04-02 ENCOUNTER — Encounter: Payer: Self-pay | Admitting: Internal Medicine

## 2014-04-03 DIAGNOSIS — H43819 Vitreous degeneration, unspecified eye: Secondary | ICD-10-CM | POA: Diagnosis not present

## 2014-04-03 DIAGNOSIS — H4010X0 Unspecified open-angle glaucoma, stage unspecified: Secondary | ICD-10-CM | POA: Diagnosis not present

## 2014-04-03 DIAGNOSIS — H2510 Age-related nuclear cataract, unspecified eye: Secondary | ICD-10-CM | POA: Diagnosis not present

## 2014-04-04 ENCOUNTER — Ambulatory Visit (INDEPENDENT_AMBULATORY_CARE_PROVIDER_SITE_OTHER): Payer: Medicare Other | Admitting: Internal Medicine

## 2014-04-04 ENCOUNTER — Encounter: Payer: Self-pay | Admitting: Internal Medicine

## 2014-04-04 VITALS — BP 118/78 | HR 97 | Temp 98.5°F | Ht 64.0 in | Wt 159.2 lb

## 2014-04-04 DIAGNOSIS — I10 Essential (primary) hypertension: Secondary | ICD-10-CM

## 2014-04-04 DIAGNOSIS — R9389 Abnormal findings on diagnostic imaging of other specified body structures: Secondary | ICD-10-CM

## 2014-04-04 DIAGNOSIS — R05 Cough: Secondary | ICD-10-CM

## 2014-04-04 DIAGNOSIS — R059 Cough, unspecified: Secondary | ICD-10-CM

## 2014-04-04 DIAGNOSIS — E7800 Pure hypercholesterolemia, unspecified: Secondary | ICD-10-CM

## 2014-04-04 DIAGNOSIS — J069 Acute upper respiratory infection, unspecified: Secondary | ICD-10-CM | POA: Diagnosis not present

## 2014-04-04 LAB — CBC WITH DIFFERENTIAL/PLATELET
BASOS PCT: 0.7 % (ref 0.0–3.0)
Basophils Absolute: 0 10*3/uL (ref 0.0–0.1)
EOS ABS: 0.4 10*3/uL (ref 0.0–0.7)
Eosinophils Relative: 6.1 % — ABNORMAL HIGH (ref 0.0–5.0)
HCT: 38 % (ref 36.0–46.0)
HEMOGLOBIN: 12.7 g/dL (ref 12.0–15.0)
Lymphocytes Relative: 42.7 % (ref 12.0–46.0)
Lymphs Abs: 2.7 10*3/uL (ref 0.7–4.0)
MCHC: 33.3 g/dL (ref 30.0–36.0)
MCV: 95.1 fl (ref 78.0–100.0)
MONO ABS: 0.5 10*3/uL (ref 0.1–1.0)
Monocytes Relative: 7.1 % (ref 3.0–12.0)
Neutro Abs: 2.8 10*3/uL (ref 1.4–7.7)
Neutrophils Relative %: 43.4 % (ref 43.0–77.0)
Platelets: 353 10*3/uL (ref 150.0–400.0)
RBC: 4 Mil/uL (ref 3.87–5.11)
RDW: 13.8 % (ref 11.5–15.5)
WBC: 6.4 10*3/uL (ref 4.0–10.5)

## 2014-04-04 LAB — LIPID PANEL
CHOL/HDL RATIO: 2
CHOLESTEROL: 212 mg/dL — AB (ref 0–200)
HDL: 88.4 mg/dL (ref >39.00)
LDL CALC: 95 mg/dL (ref 0–99)
NONHDL: 123.6
Triglycerides: 145 mg/dL (ref 0.0–149.0)
VLDL: 29 mg/dL (ref 0.0–40.0)

## 2014-04-04 LAB — HEPATIC FUNCTION PANEL
ALT: 16 U/L (ref 0–35)
AST: 22 U/L (ref 0–37)
Albumin: 4.5 g/dL (ref 3.5–5.2)
Alkaline Phosphatase: 53 U/L (ref 39–117)
Bilirubin, Direct: 0.1 mg/dL (ref 0.0–0.3)
Total Bilirubin: 0.7 mg/dL (ref 0.2–1.2)
Total Protein: 7.2 g/dL (ref 6.0–8.3)

## 2014-04-04 LAB — BASIC METABOLIC PANEL
BUN: 19 mg/dL (ref 6–23)
CHLORIDE: 104 meq/L (ref 96–112)
CO2: 29 meq/L (ref 19–32)
Calcium: 10.2 mg/dL (ref 8.4–10.5)
Creatinine, Ser: 0.7 mg/dL (ref 0.4–1.2)
GFR: 81.46 mL/min (ref >60.00)
Glucose, Bld: 91 mg/dL (ref 70–99)
POTASSIUM: 4.7 meq/L (ref 3.5–5.1)
SODIUM: 140 meq/L (ref 135–145)

## 2014-04-04 NOTE — Progress Notes (Signed)
Pre visit review using our clinic review tool, if applicable. No additional management support is needed unless otherwise documented below in the visit note. 

## 2014-04-05 ENCOUNTER — Encounter: Payer: Self-pay | Admitting: Internal Medicine

## 2014-04-06 ENCOUNTER — Encounter: Payer: Self-pay | Admitting: Internal Medicine

## 2014-04-08 ENCOUNTER — Encounter: Payer: Self-pay | Admitting: Internal Medicine

## 2014-04-08 DIAGNOSIS — R9389 Abnormal findings on diagnostic imaging of other specified body structures: Secondary | ICD-10-CM | POA: Insufficient documentation

## 2014-04-08 DIAGNOSIS — R05 Cough: Secondary | ICD-10-CM | POA: Insufficient documentation

## 2014-04-08 DIAGNOSIS — R059 Cough, unspecified: Secondary | ICD-10-CM | POA: Insufficient documentation

## 2014-04-08 NOTE — Progress Notes (Signed)
  Subjective:    Patient ID: Leah Huber, female    DOB: [birth year], 73 y.o.   MRN: [id number]  HPI 73 year old female with past history of hypertension and hypercholesterolemia who comes in today as a work in for an acute care follow up.  She was seen on 03/31/14 for increased cough.  See their note for details.  CXR was obtained and she was given a zpak.  She states she feel better.  Cough is still present but better.  No sob or chest tightness.  No acid reflux.  Eating and drinking well.   No nausea or vomiting.  No bowel change.    Past Medical History  Diagnosis Date  . Arthritis   . Depression   . Diverticulosis   . Hypertension   . Hypercholesterolemia   . Colon polyp   . Urinary incontinence   . Abnormal liver function test 12/10  . Hypovolemia 12/10    acute renal failure    Current Outpatient Prescriptions on File Prior to Visit  Medication Sig Dispense Refill  . acetaminophen (TYLENOL) 325 MG tablet Take 650 mg by mouth every 6 (six) hours as needed.      . Bimatoprost (LUMIGAN OP) Apply 1 drop to eye daily. Each eye      . DETROL LA 4 MG 24 hr capsule TAKE 1 CAPSULE DAILY  90 capsule  3  . DIOVAN 80 MG tablet TAKE 1 TABLET DAILY  90 tablet  1  . pravastatin (PRAVACHOL) 20 MG tablet Take 1 tablet (20 mg total) by mouth daily.  90 tablet  3  . PROTONIX 40 MG tablet TAKE 1 TABLET TWICE A DAY  180 tablet  1  . benzonatate (TESSALON) 100 MG capsule Take 1 capsule (100 mg total) by mouth 3 (three) times daily as needed for cough.  21 capsule  0   No current facility-administered medications on file prior to visit.    Review of Systems Patient denies any headache, lightheadedness or dizziness.   No chest pain, tightness or palpitations.  No increased shortness of breath.  Cough better.   No nausea or vomiting.  No acid reflux.  No abdominal pain or cramping.  No bowel change, such as diarrhea, constipation, BRBPR or melana.  No urine change.       Objective:   Physical  Exam  Filed Vitals:   04/04/14 1316  BP: 118/78  Pulse: 97  Temp: 98.5 F (75.52 C)   73 year old female in no acute distress.   HEENT:  Nares- clear.  Oropharynx - without lesions.      NECK:  Supple.  Nontender.  HEART:  Appears to be regular. LUNGS:  No crackles or wheezing audible.  Respirations even and unlabored.  No significant cough with expiration or forced expiration.  ABDOMEN:  Soft, nontender.  Bowel sounds present and normal.  No audible abdominal bruit.  EXTREMITIES:  No increased edema present.          Assessment & Plan:  HEALTH MAINTENANCE.  Physical 10/09/13.  Mammogram 11/27/13 - BiRADS I.  Had colonoscopy 01/19/14.  Recommended f/u colonoscopy in 2020.

## 2014-04-08 NOTE — Assessment & Plan Note (Signed)
Dense nodular density seen projected over lower thoracic vertebral body on lateral projection only which was not present on prior exam (may represent granuloma), but further evaluation with chest CT recommended.  This was discussed with the pt and will order chest CT.

## 2014-04-08 NOTE — Assessment & Plan Note (Signed)
Evaluated at acute care.  Diagnosed with bronchitis.  Treated with zpak.  Better.  Continue robitussin/mucinex as directed.  Follow.  Follow up on abnormal cxr as outlined.

## 2014-04-13 ENCOUNTER — Encounter: Payer: Self-pay | Admitting: Internal Medicine

## 2014-04-16 ENCOUNTER — Ambulatory Visit: Payer: Self-pay | Admitting: Internal Medicine

## 2014-04-16 DIAGNOSIS — I712 Thoracic aortic aneurysm, without rupture, unspecified: Secondary | ICD-10-CM | POA: Diagnosis not present

## 2014-04-16 DIAGNOSIS — I2510 Atherosclerotic heart disease of native coronary artery without angina pectoris: Secondary | ICD-10-CM | POA: Diagnosis not present

## 2014-04-16 DIAGNOSIS — R918 Other nonspecific abnormal finding of lung field: Secondary | ICD-10-CM | POA: Diagnosis not present

## 2014-04-16 DIAGNOSIS — I714 Abdominal aortic aneurysm, without rupture, unspecified: Secondary | ICD-10-CM | POA: Diagnosis not present

## 2014-04-18 ENCOUNTER — Telehealth: Payer: Self-pay | Admitting: Internal Medicine

## 2014-04-18 DIAGNOSIS — I2510 Atherosclerotic heart disease of native coronary artery without angina pectoris: Secondary | ICD-10-CM

## 2014-04-18 DIAGNOSIS — I7121 Aneurysm of the ascending aorta, without rupture: Secondary | ICD-10-CM

## 2014-04-18 DIAGNOSIS — I2584 Coronary atherosclerosis due to calcified coronary lesion: Principal | ICD-10-CM

## 2014-04-18 DIAGNOSIS — I712 Thoracic aortic aneurysm, without rupture: Secondary | ICD-10-CM

## 2014-04-18 NOTE — Telephone Encounter (Signed)
Pt notified of CT chest results.  CT revealed three vessel coronary artery calcification.  Discussed with Serafina Royals.  Wants to see pt.  Pt in agreement.  Order placed for referral.  Pt also informed of 14mm ascending aortic aneurysm.  Refer to vascular surgery for further evaluation.  Pt in agreement.  Order placed for referral.  No worrisome pulmonary nodules.

## 2014-04-18 NOTE — Telephone Encounter (Signed)
My chart message sent to pt regarding CT scan results.  No worrisome pulmonary nodules.  Will cal her today with full details and further w/up.

## 2014-04-19 ENCOUNTER — Other Ambulatory Visit: Payer: Self-pay | Admitting: Internal Medicine

## 2014-04-19 ENCOUNTER — Other Ambulatory Visit (INDEPENDENT_AMBULATORY_CARE_PROVIDER_SITE_OTHER): Payer: Medicare Other

## 2014-04-19 DIAGNOSIS — I10 Essential (primary) hypertension: Secondary | ICD-10-CM

## 2014-04-19 LAB — CREATININE, SERUM: Creatinine, Ser: 0.8 mg/dL (ref 0.4–1.2)

## 2014-04-19 NOTE — Progress Notes (Signed)
Order placed for creatinine

## 2014-04-20 ENCOUNTER — Encounter: Payer: Self-pay | Admitting: Internal Medicine

## 2014-04-23 DIAGNOSIS — I2510 Atherosclerotic heart disease of native coronary artery without angina pectoris: Secondary | ICD-10-CM | POA: Diagnosis not present

## 2014-04-23 DIAGNOSIS — E785 Hyperlipidemia, unspecified: Secondary | ICD-10-CM | POA: Diagnosis not present

## 2014-04-23 DIAGNOSIS — I10 Essential (primary) hypertension: Secondary | ICD-10-CM | POA: Diagnosis not present

## 2014-05-17 ENCOUNTER — Encounter: Payer: Self-pay | Admitting: Internal Medicine

## 2014-05-17 ENCOUNTER — Other Ambulatory Visit: Payer: Self-pay | Admitting: *Deleted

## 2014-05-17 MED ORDER — PANTOPRAZOLE SODIUM 40 MG PO TBEC: DELAYED_RELEASE_TABLET | ORAL | Status: DC

## 2014-06-20 ENCOUNTER — Encounter: Payer: Self-pay | Admitting: Internal Medicine

## 2014-07-03 ENCOUNTER — Other Ambulatory Visit: Payer: Self-pay | Admitting: Internal Medicine

## 2014-07-26 ENCOUNTER — Other Ambulatory Visit: Payer: Self-pay | Admitting: Internal Medicine

## 2014-07-30 ENCOUNTER — Encounter: Payer: Self-pay | Admitting: Internal Medicine

## 2014-07-30 ENCOUNTER — Ambulatory Visit (INDEPENDENT_AMBULATORY_CARE_PROVIDER_SITE_OTHER): Payer: Medicare Other | Admitting: Internal Medicine

## 2014-07-30 VITALS — BP 130/70 | HR 76 | Temp 98.4°F | Ht 64.0 in | Wt 162.8 lb

## 2014-07-30 DIAGNOSIS — I2510 Atherosclerotic heart disease of native coronary artery without angina pectoris: Secondary | ICD-10-CM

## 2014-07-30 DIAGNOSIS — Z23 Encounter for immunization: Secondary | ICD-10-CM

## 2014-07-30 DIAGNOSIS — E7800 Pure hypercholesterolemia, unspecified: Secondary | ICD-10-CM

## 2014-07-30 DIAGNOSIS — D649 Anemia, unspecified: Secondary | ICD-10-CM

## 2014-07-30 DIAGNOSIS — I10 Essential (primary) hypertension: Secondary | ICD-10-CM

## 2014-07-30 DIAGNOSIS — R9389 Abnormal findings on diagnostic imaging of other specified body structures: Secondary | ICD-10-CM

## 2014-07-30 DIAGNOSIS — M542 Cervicalgia: Secondary | ICD-10-CM | POA: Diagnosis not present

## 2014-07-30 DIAGNOSIS — D721 Eosinophilia, unspecified: Secondary | ICD-10-CM

## 2014-07-30 DIAGNOSIS — K219 Gastro-esophageal reflux disease without esophagitis: Secondary | ICD-10-CM

## 2014-07-30 DIAGNOSIS — I2584 Coronary atherosclerosis due to calcified coronary lesion: Secondary | ICD-10-CM | POA: Diagnosis not present

## 2014-07-30 DIAGNOSIS — Z86010 Personal history of colonic polyps: Secondary | ICD-10-CM

## 2014-07-30 DIAGNOSIS — R938 Abnormal findings on diagnostic imaging of other specified body structures: Secondary | ICD-10-CM

## 2014-07-30 NOTE — Progress Notes (Signed)
Pre visit review using our clinic review tool, if applicable. No additional management support is needed unless otherwise documented below in the visit note. 

## 2014-07-30 NOTE — Progress Notes (Signed)
Subjective:    Patient ID: Leah Huber, female    DOB: 09-04-1941, 73 y.o.   MRN: [id number]  HPI 73 year old female with past history of hypertension and hypercholesterolemia who comes in today for a scheduled follow up.   No sob or chest tightness.  No acid reflux.  Eating and drinking well.   No nausea or vomiting.  No bowel change.  Has decreased her alcohol intake significantly.  Does not drink at all (at home).  Only has a drink if eating dinner.  She does report increased neck and shoulder pain.  Extends into her shoulder blades (posterior).  If she turns her head - aggravates.  Taking advil.     Past Medical History  Diagnosis Date  . Arthritis   . Depression   . Diverticulosis   . Hypertension   . Hypercholesterolemia   . Colon polyp   . Urinary incontinence   . Abnormal liver function test 12/10  . Hypovolemia 12/10    acute renal failure    Current Outpatient Prescriptions on File Prior to Visit  Medication Sig Dispense Refill  . Bimatoprost (LUMIGAN OP) Apply 1 drop to eye daily. Each eye    . DETROL LA 4 MG 24 hr capsule TAKE 1 CAPSULE DAILY 90 capsule 1  . DIOVAN 80 MG tablet TAKE 1 TABLET DAILY 90 tablet 1  . pantoprazole (PROTONIX) 40 MG tablet TAKE 1 TABLET TWICE A DAY 180 tablet 1  . pravastatin (PRAVACHOL) 20 MG tablet TAKE 1 TABLET DAILY 90 tablet 1   No current facility-administered medications on file prior to visit.    Review of Systems Patient denies any headache, lightheadedness or dizziness.   No chest pain, tightness or palpitations.  No increased shortness of breath.  No cough or congestion.   No nausea or vomiting.  No acid reflux.  No abdominal pain or cramping.  No bowel change, such as diarrhea, constipation, BRBPR or melana.  No urine change.  Neck and shoulder pain as outlined.       Objective:   Physical Exam  Filed Vitals:   07/30/14 1437  BP: 130/70  Pulse: 76  Temp: 98.4 F (36.9 C)   Blood pressure recheck:  50/12  73  year old female in no acute distress.   HEENT:  Nares- clear.  Oropharynx - without lesions.      NECK:  Supple.  Nontender.  HEART:  Appears to be regular. LUNGS:  No crackles or wheezing audible.  Respirations even and unlabored.  No significant cough with expiration or forced expiration.  ABDOMEN:  Soft, nontender.  Bowel sounds present and normal.  No audible abdominal bruit.  EXTREMITIES:  No increased edema present.   MSK:  Increased pain with rotation of her head (pain in posterior neck).          Assessment & Plan:  1. Need for prophylactic vaccination against Streptococcus pneumoniae (pneumococcus) - Pneumococcal conjugate vaccine 13-valent  2. Essential hypertension Blood pressure doing well.  Same medication regimen.  - Basic metabolic panel; Future  3. Gastroesophageal reflux disease, esophagitis presence not specified Controlled on protonix.    4. Neck pain Neck pain as outlined. Tylenol as directed.  Check C-spine xray.    5. Hypercholesterolemia On pravastatin.  Low cholesterol diet and exercise.   - Lipid panel; Future - Hepatic function panel; Future  6. Anemia, unspecified anemia type Follow cbc.   7. History of colonic polyps Colonoscopy 01/19/14 - diverticulosis and internal  hemorrhoids.  Recommended f/u colonoscopy in 01/2019.    8. Abnormal CXR CT chest - no worrisome pulmonary nodules.  Small ascending aortic aneurysm and three vessel coronary artery calcification.  Saw Dr Nehemiah Massed for the coronary artery calcification.  Recommended aggressive risk factor modification.  Referred to vascular surgery for evaluation of the aneurysm.  Obtain records.   9. Eosinophilia - CBC with Differential; Future  HEALTH MAINTENANCE.  Physical 10/09/13.  Mammogram 11/27/13 - BiRADS I.  Had colonoscopy 01/19/14.  Recommended f/u colonoscopy in 2020.

## 2014-07-31 ENCOUNTER — Ambulatory Visit: Payer: Self-pay | Admitting: Internal Medicine

## 2014-07-31 DIAGNOSIS — M5030 Other cervical disc degeneration, unspecified cervical region: Secondary | ICD-10-CM | POA: Diagnosis not present

## 2014-07-31 DIAGNOSIS — M8580 Other specified disorders of bone density and structure, unspecified site: Secondary | ICD-10-CM | POA: Diagnosis not present

## 2014-07-31 DIAGNOSIS — M19019 Primary osteoarthritis, unspecified shoulder: Secondary | ICD-10-CM | POA: Diagnosis not present

## 2014-08-01 ENCOUNTER — Telehealth: Payer: Self-pay | Admitting: Internal Medicine

## 2014-08-01 DIAGNOSIS — M542 Cervicalgia: Secondary | ICD-10-CM

## 2014-08-01 DIAGNOSIS — M25512 Pain in left shoulder: Secondary | ICD-10-CM

## 2014-08-01 DIAGNOSIS — M25511 Pain in right shoulder: Secondary | ICD-10-CM

## 2014-08-01 NOTE — Telephone Encounter (Signed)
Pt notified of c-spine xray results and desire for mri via my chart.

## 2014-08-02 ENCOUNTER — Encounter: Payer: Self-pay | Admitting: Internal Medicine

## 2014-08-02 ENCOUNTER — Telehealth: Payer: Self-pay | Admitting: Internal Medicine

## 2014-08-02 DIAGNOSIS — M542 Cervicalgia: Secondary | ICD-10-CM | POA: Insufficient documentation

## 2014-08-02 NOTE — Addendum Note (Signed)
Addended by: Alisa Graff on: 08/02/2014 07:37 PM   Modules accepted: Orders

## 2014-08-02 NOTE — Telephone Encounter (Signed)
Pt was referred to [facility] (Dr Delana Meyer) for evaluation of aortic aneurysm.  Need office note.

## 2014-08-02 NOTE — Telephone Encounter (Signed)
Order placed for mri as radiology recommended.  Pt notified via my chart.

## 2014-08-06 NOTE — Telephone Encounter (Signed)
Records requested via fax 

## 2014-08-07 NOTE — Telephone Encounter (Signed)
Received fax response back notifying that patient cancelled her appointment

## 2014-08-07 NOTE — Telephone Encounter (Signed)
Please notify pt that I would like for her to reschedule her appt or for [place] to reschedule.  Thanks.

## 2014-08-08 ENCOUNTER — Encounter: Payer: Self-pay | Admitting: *Deleted

## 2014-08-08 NOTE — Telephone Encounter (Signed)
Sent mychart message

## 2014-08-15 ENCOUNTER — Encounter: Payer: Self-pay | Admitting: Internal Medicine

## 2014-08-16 ENCOUNTER — Ambulatory Visit: Payer: Self-pay | Admitting: Internal Medicine

## 2014-08-16 DIAGNOSIS — M4802 Spinal stenosis, cervical region: Secondary | ICD-10-CM | POA: Diagnosis not present

## 2014-08-16 DIAGNOSIS — M47892 Other spondylosis, cervical region: Secondary | ICD-10-CM | POA: Diagnosis not present

## 2014-08-16 DIAGNOSIS — M5023 Other cervical disc displacement, cervicothoracic region: Secondary | ICD-10-CM | POA: Diagnosis not present

## 2014-08-16 DIAGNOSIS — M5022 Other cervical disc displacement, mid-cervical region: Secondary | ICD-10-CM | POA: Diagnosis not present

## 2014-08-16 DIAGNOSIS — M5382 Other specified dorsopathies, cervical region: Secondary | ICD-10-CM | POA: Diagnosis not present

## 2014-08-16 DIAGNOSIS — M2578 Osteophyte, vertebrae: Secondary | ICD-10-CM | POA: Diagnosis not present

## 2014-08-20 ENCOUNTER — Other Ambulatory Visit: Payer: Self-pay | Admitting: Internal Medicine

## 2014-08-20 ENCOUNTER — Encounter: Payer: Self-pay | Admitting: Internal Medicine

## 2014-08-20 ENCOUNTER — Other Ambulatory Visit (INDEPENDENT_AMBULATORY_CARE_PROVIDER_SITE_OTHER): Payer: Medicare Other

## 2014-08-20 DIAGNOSIS — D721 Eosinophilia, unspecified: Secondary | ICD-10-CM

## 2014-08-20 DIAGNOSIS — M4802 Spinal stenosis, cervical region: Secondary | ICD-10-CM

## 2014-08-20 DIAGNOSIS — I10 Essential (primary) hypertension: Secondary | ICD-10-CM | POA: Diagnosis not present

## 2014-08-20 DIAGNOSIS — E7800 Pure hypercholesterolemia, unspecified: Secondary | ICD-10-CM

## 2014-08-20 DIAGNOSIS — D649 Anemia, unspecified: Secondary | ICD-10-CM

## 2014-08-20 DIAGNOSIS — M542 Cervicalgia: Secondary | ICD-10-CM

## 2014-08-20 LAB — HEPATIC FUNCTION PANEL
ALBUMIN: 4.4 g/dL (ref 3.5–5.2)
ALT: 19 U/L (ref 0–35)
AST: 24 U/L (ref 0–37)
Alkaline Phosphatase: 49 U/L (ref 39–117)
BILIRUBIN DIRECT: 0.1 mg/dL (ref 0.0–0.3)
TOTAL PROTEIN: 7 g/dL (ref 6.0–8.3)
Total Bilirubin: 0.8 mg/dL (ref 0.2–1.2)

## 2014-08-20 LAB — BASIC METABOLIC PANEL
BUN: 23 mg/dL (ref 6–23)
CO2: 27 mEq/L (ref 19–32)
Calcium: 9.5 mg/dL (ref 8.4–10.5)
Chloride: 104 mEq/L (ref 96–112)
Creatinine, Ser: 0.9 mg/dL (ref 0.4–1.2)
GFR: 61.75 mL/min (ref >60.00)
Glucose, Bld: 98 mg/dL (ref 70–99)
POTASSIUM: 4.8 meq/L (ref 3.5–5.1)
Sodium: 138 mEq/L (ref 135–145)

## 2014-08-20 LAB — CBC WITH DIFFERENTIAL/PLATELET
BASOS PCT: 1 % (ref 0.0–3.0)
Basophils Absolute: 0.1 10*3/uL (ref 0.0–0.1)
Eosinophils Absolute: 0.5 10*3/uL (ref 0.0–0.7)
Eosinophils Relative: 7.4 % — ABNORMAL HIGH (ref 0.0–5.0)
HCT: 37.4 % (ref 36.0–46.0)
HEMOGLOBIN: 12.2 g/dL (ref 12.0–15.0)
LYMPHS ABS: 2.6 10*3/uL (ref 0.7–4.0)
Lymphocytes Relative: 39.2 % (ref 12.0–46.0)
MCHC: 32.6 g/dL (ref 30.0–36.0)
MCV: 97.9 fl (ref 78.0–100.0)
MONO ABS: 0.5 10*3/uL (ref 0.1–1.0)
MONOS PCT: 7 % (ref 3.0–12.0)
NEUTROS ABS: 3 10*3/uL (ref 1.4–7.7)
Neutrophils Relative %: 45.4 % (ref 43.0–77.0)
Platelets: 311 10*3/uL (ref 150.0–400.0)
RBC: 3.82 Mil/uL — ABNORMAL LOW (ref 3.87–5.11)
RDW: 13.6 % (ref 11.5–15.5)
WBC: 6.6 10*3/uL (ref 4.0–10.5)

## 2014-08-20 LAB — TSH: TSH: 2.6 u[IU]/mL (ref 0.35–4.50)

## 2014-08-20 NOTE — Progress Notes (Signed)
Order placed for lipid profile 

## 2014-08-21 ENCOUNTER — Other Ambulatory Visit (INDEPENDENT_AMBULATORY_CARE_PROVIDER_SITE_OTHER): Payer: Medicare Other

## 2014-08-21 DIAGNOSIS — E7800 Pure hypercholesterolemia, unspecified: Secondary | ICD-10-CM

## 2014-08-21 LAB — LIPID PANEL
CHOL/HDL RATIO: 3
Cholesterol: 251 mg/dL — ABNORMAL HIGH (ref 0–200)
HDL: 82.7 mg/dL (ref >39.00)
LDL CALC: 136 mg/dL — AB (ref 0–99)
NonHDL: 168.3
Triglycerides: 160 mg/dL — ABNORMAL HIGH (ref 0.0–149.0)
VLDL: 32 mg/dL (ref 0.0–40.0)

## 2014-08-23 ENCOUNTER — Encounter: Payer: Self-pay | Admitting: Internal Medicine

## 2014-08-23 NOTE — Telephone Encounter (Signed)
Order placed for referral.  

## 2014-08-26 MED ORDER — PRAVASTATIN SODIUM 40 MG PO TABS: 40.0000 mg | ORAL_TABLET | Freq: Every day | ORAL | Status: DC

## 2014-08-26 NOTE — Telephone Encounter (Signed)
rx sent to [facility] for 40mg  of pravastatin.  Pt notified via my chart.

## 2014-08-28 ENCOUNTER — Encounter: Payer: Self-pay | Admitting: Internal Medicine

## 2014-09-25 DIAGNOSIS — M542 Cervicalgia: Secondary | ICD-10-CM | POA: Diagnosis not present

## 2014-09-25 DIAGNOSIS — M4802 Spinal stenosis, cervical region: Secondary | ICD-10-CM | POA: Diagnosis not present

## 2014-09-25 DIAGNOSIS — M4712 Other spondylosis with myelopathy, cervical region: Secondary | ICD-10-CM | POA: Diagnosis not present

## 2014-09-25 DIAGNOSIS — M5412 Radiculopathy, cervical region: Secondary | ICD-10-CM | POA: Diagnosis not present

## 2014-09-25 DIAGNOSIS — M4312 Spondylolisthesis, cervical region: Secondary | ICD-10-CM | POA: Diagnosis not present

## 2014-10-05 DIAGNOSIS — H4010X0 Unspecified open-angle glaucoma, stage unspecified: Secondary | ICD-10-CM | POA: Diagnosis not present

## 2014-10-08 DIAGNOSIS — H4011X1 Primary open-angle glaucoma, mild stage: Secondary | ICD-10-CM | POA: Diagnosis not present

## 2014-10-29 ENCOUNTER — Other Ambulatory Visit (HOSPITAL_COMMUNITY): Payer: Self-pay | Admitting: Neurosurgery

## 2014-11-02 ENCOUNTER — Ambulatory Visit (INDEPENDENT_AMBULATORY_CARE_PROVIDER_SITE_OTHER): Payer: Medicare Other | Admitting: Internal Medicine

## 2014-11-02 ENCOUNTER — Encounter: Payer: Self-pay | Admitting: Internal Medicine

## 2014-11-02 VITALS — BP 120/80 | HR 80 | Temp 98.1°F | Ht 64.0 in | Wt 166.5 lb

## 2014-11-02 DIAGNOSIS — I719 Aortic aneurysm of unspecified site, without rupture: Secondary | ICD-10-CM

## 2014-11-02 DIAGNOSIS — R938 Abnormal findings on diagnostic imaging of other specified body structures: Secondary | ICD-10-CM

## 2014-11-02 DIAGNOSIS — Z86010 Personal history of colonic polyps: Secondary | ICD-10-CM

## 2014-11-02 DIAGNOSIS — I10 Essential (primary) hypertension: Secondary | ICD-10-CM

## 2014-11-02 DIAGNOSIS — N6489 Other specified disorders of breast: Secondary | ICD-10-CM

## 2014-11-02 DIAGNOSIS — D649 Anemia, unspecified: Secondary | ICD-10-CM | POA: Diagnosis not present

## 2014-11-02 DIAGNOSIS — Z0000 Encounter for general adult medical examination without abnormal findings: Secondary | ICD-10-CM | POA: Diagnosis not present

## 2014-11-02 DIAGNOSIS — E7800 Pure hypercholesterolemia, unspecified: Secondary | ICD-10-CM

## 2014-11-02 DIAGNOSIS — M542 Cervicalgia: Secondary | ICD-10-CM | POA: Diagnosis not present

## 2014-11-02 DIAGNOSIS — K219 Gastro-esophageal reflux disease without esophagitis: Secondary | ICD-10-CM | POA: Diagnosis not present

## 2014-11-02 DIAGNOSIS — R9389 Abnormal findings on diagnostic imaging of other specified body structures: Secondary | ICD-10-CM

## 2014-11-02 NOTE — Progress Notes (Signed)
Pre visit review using our clinic review tool, if applicable. No additional management support is needed unless otherwise documented below in the visit note. 

## 2014-11-02 NOTE — Progress Notes (Signed)
Patient ID: Leah Huber, female   DOB: May 25, 1941, 74 y.o.   MRN: [id number]   Subjective:    Patient ID: Leah Huber, female    DOB: [birth year], 74 y.o.   MRN: [id number]  HPI  Patient here for her physical exam.  She is planning to have neck surgery 11/28/14.  Is in constant pain.  Taking advil.  Seeing neurosurgery.  Going for pre op 11/19/14.  States other than her neck, she is doing well.  No chest pain or tightness.  No sob.  No cough or congestion.  Eating and drinking well.  No nausea or vomiting.  No bowel change.  Blood pressure doing well.  Not drinking alcohol.     Past Medical History  Diagnosis Date  . Arthritis   . Depression   . Diverticulosis   . Hypertension   . Hypercholesterolemia   . Colon polyp   . Urinary incontinence   . Abnormal liver function test 12/10  . Hypovolemia 12/10    acute renal failure    Current Outpatient Prescriptions on File Prior to Visit  Medication Sig Dispense Refill  . Bimatoprost (LUMIGAN OP) Apply 1 drop to eye daily. Each eye    . DETROL LA 4 MG 24 hr capsule TAKE 1 CAPSULE DAILY 90 capsule 1  . DIOVAN 80 MG tablet TAKE 1 TABLET DAILY 90 tablet 1  . pantoprazole (PROTONIX) 40 MG tablet TAKE 1 TABLET TWICE A DAY 180 tablet 1  . pravastatin (PRAVACHOL) 40 MG tablet Take 1 tablet (40 mg total) by mouth daily. 90 tablet 1   No current facility-administered medications on file prior to visit.    Review of Systems  Constitutional: Negative for appetite change and unexpected weight change.  HENT: Negative for congestion and sinus pressure.   Eyes: Negative for discharge and visual disturbance.  Respiratory: Negative for cough, chest tightness and shortness of breath.   Cardiovascular: Negative for chest pain, palpitations and leg swelling.  Gastrointestinal: Negative for nausea, vomiting, abdominal pain and diarrhea.  Genitourinary: Negative for frequency and difficulty urinating.  Musculoskeletal: Negative for back pain  and joint swelling.  Skin: Negative for color change and rash.  Neurological: Negative for dizziness, light-headedness and headaches.  Hematological: Negative for adenopathy. Does not bruise/bleed easily.  Psychiatric/Behavioral: Negative for behavioral problems and dysphoric mood.       Objective:     Blood pressure recheck:  126/78 left and 132/68 right.    Physical Exam  Constitutional: She appears well-developed and well-nourished. No distress.  HENT:  Nose: Nose normal.  Mouth/Throat: Oropharynx is clear and moist.  Neck: Neck supple. No thyromegaly present.  Cardiovascular: Normal rate and regular rhythm.   Pulmonary/Chest: Breath sounds normal. No respiratory distress. She has no wheezes.  Breast exam reveals some increase fullness left breast 2-5:00 region.  Could not appreciate any nipple retraction. No nipple discharge.  No other masses.  No axillary adenopathy.    Abdominal: Soft. Bowel sounds are normal. There is no tenderness.  Musculoskeletal: She exhibits no edema or tenderness.  Lymphadenopathy:    She has no cervical adenopathy.  Neurological: She is alert. Coordination normal.  Skin: No rash noted. No erythema.  Psychiatric: She has a normal mood and affect. Her behavior is normal.    BP 120/80 mmHg  Pulse 80  Temp(Src) 98.1 F (36.7 C) (Oral)  Ht 5\' 4"  (1.626 m)  Wt 166 lb 8 oz (75.524 kg)  BMI 28.57 kg/m2  SpO2 98%  LMP 08/15/1978 Wt Readings from Last 3 Encounters:  11/02/14 166 lb 8 oz (75.524 kg)  07/30/14 162 lb 12 oz (73.823 kg)  04/04/14 159 lb 4 oz (72.235 kg)     Lab Results  Component Value Date   WBC 6.6 08/20/2014   HGB 12.2 08/20/2014   HCT 37.4 08/20/2014   PLT 311.0 08/20/2014   GLUCOSE 98 08/20/2014   CHOL 251* 08/21/2014   TRIG 160.0* 08/21/2014   HDL 82.70 08/21/2014   LDLDIRECT 147.1 05/30/2013   LDLCALC 136* 08/21/2014   ALT 19 08/20/2014   AST 24 08/20/2014   NA 138 08/20/2014   K 4.8 08/20/2014   CL 104 08/20/2014     CREATININE 0.9 08/20/2014   BUN 23 08/20/2014   CO2 27 08/20/2014   TSH 2.60 08/20/2014       Assessment & Plan:   Problem List Items Addressed This Visit    Abnormal CXR    CT as outlined.  Saw cardiology.  They felt no further w/up warranted.  Had ascending aortic aneurysm.  Was referred to vascular surgery.  Did not keep appt.  Follow up with pt regarding referral.        Anemia    08/20/14 - hgb 12.2.  Follow.        Relevant Orders   CBC with Differential/Platelet   Aortic aneurysm    Ascending aortic aneurysm noted on CT.  See CT report for details.  Was referred to vascular surgery.  Apparently never kept the appt.  F/u with pt regarding referral.  She states was checked by cardiology.        Fullness of breast - Primary    Obtain diagnostic mammogram and left breast ultrasound.  Further w/up pending results.        Relevant Orders   MM Digital Diagnostic Bilat   US BREAST COMPLETE UNI LEFT INC AXILLA   GERD (gastroesophageal reflux disease)    Symptoms controlled on protonix.  Follow.        Health care maintenance    Physical today (11/02/14).  Mammogram 11/27/13 - Birads I.  Schedule diagnostic mammogram witn ultrasound (left) - given left breast fullness.  Colonoscopy 01/19/14.  Recommended f/u colonoscopy in 01/2019.        History of colonic polyps    Colonoscopy 01/2014.  Recommend f/u colonoscopy in 01/2019.        Hypercholesterolemia    Low cholesterol diet and exercise.  On pravastatin.  Increased to 40mg  previously.  Check lipid panel and liver function tests.        Relevant Orders   Hepatic function panel   Lipid panel   Hypertension    Blood pressure has been under good control.  Same medication regimen.  Follow pressures.        Relevant Orders   Basic metabolic panel   Neck pain    Saw neurosurgery.  Planning for surgery.          I spent 25 minutes with the patient and more than 50% of the time was spent in consultation regarding the  above.     Einar Pheasant, MD

## 2014-11-04 ENCOUNTER — Encounter: Payer: Self-pay | Admitting: Internal Medicine

## 2014-11-04 DIAGNOSIS — N6489 Other specified disorders of breast: Secondary | ICD-10-CM | POA: Insufficient documentation

## 2014-11-04 DIAGNOSIS — Z0000 Encounter for general adult medical examination without abnormal findings: Secondary | ICD-10-CM | POA: Insufficient documentation

## 2014-11-04 DIAGNOSIS — I719 Aortic aneurysm of unspecified site, without rupture: Secondary | ICD-10-CM | POA: Insufficient documentation

## 2014-11-04 NOTE — Assessment & Plan Note (Signed)
Blood pressure has been under good control.  Same medication regimen.  Follow pressures.   

## 2014-11-04 NOTE — Assessment & Plan Note (Signed)
Saw neurosurgery.  Planning for surgery.

## 2014-11-04 NOTE — Assessment & Plan Note (Signed)
Low cholesterol diet and exercise.  On pravastatin.  Increased to 40mg  previously.  Check lipid panel and liver function tests.

## 2014-11-04 NOTE — Assessment & Plan Note (Signed)
Ascending aortic aneurysm noted on CT.  See CT report for details.  Was referred to vascular surgery.  Apparently never kept the appt.  F/u with pt regarding referral.  She states was checked by cardiology.

## 2014-11-04 NOTE — Assessment & Plan Note (Signed)
Physical today (11/02/14).  Mammogram 11/27/13 - Birads I.  Schedule diagnostic mammogram witn ultrasound (left) - given left breast fullness.  Colonoscopy 01/19/14.  Recommended f/u colonoscopy in 01/2019.

## 2014-11-04 NOTE — Addendum Note (Signed)
Addended by: Alisa Graff on: 11/04/2014 07:47 PM   Modules accepted: Miquel Dunn

## 2014-11-04 NOTE — Assessment & Plan Note (Signed)
Colonoscopy 01/2014.  Recommend f/u colonoscopy in 01/2019.

## 2014-11-04 NOTE — Assessment & Plan Note (Signed)
Symptoms controlled on protonix.  Follow.   

## 2014-11-04 NOTE — Assessment & Plan Note (Signed)
Obtain diagnostic mammogram and left breast ultrasound.  Further w/up pending results.

## 2014-11-04 NOTE — Assessment & Plan Note (Signed)
CT as outlined.  Saw cardiology.  They felt no further w/up warranted.  Had ascending aortic aneurysm.  Was referred to vascular surgery.  Did not keep appt.  Follow up with pt regarding referral.

## 2014-11-04 NOTE — Assessment & Plan Note (Signed)
08/20/14 - hgb 12.2.  Follow.

## 2014-11-07 ENCOUNTER — Telehealth: Payer: Self-pay | Admitting: Internal Medicine

## 2014-11-07 NOTE — Telephone Encounter (Signed)
Spoke with pt regarding her surgery and the previous CT results.  She did see Dr Nehemiah Massed and he felt no further w/up necessary from a cardiac standpoint.  He did mention to her (per pt) that she did not need to see vascular surgery and that he would follow the aneurysm.  She reports f/u in 6 months is what she remembers.  Called Dr Nehemiah Massed.  He will see pt on 11/12/14 at 3:00 for evaluation for cardiac clearance and for reevaluation of the aneurysm (to see if any further testing necessary prior to her surgery).  Pt notified and ok to keep appt.

## 2014-11-12 DIAGNOSIS — I10 Essential (primary) hypertension: Secondary | ICD-10-CM | POA: Diagnosis not present

## 2014-11-12 DIAGNOSIS — I2510 Atherosclerotic heart disease of native coronary artery without angina pectoris: Secondary | ICD-10-CM | POA: Diagnosis not present

## 2014-11-12 DIAGNOSIS — I712 Thoracic aortic aneurysm, without rupture: Secondary | ICD-10-CM | POA: Diagnosis not present

## 2014-11-12 DIAGNOSIS — E782 Mixed hyperlipidemia: Secondary | ICD-10-CM | POA: Diagnosis not present

## 2014-11-14 ENCOUNTER — Ambulatory Visit: Payer: Self-pay | Admitting: Internal Medicine

## 2014-11-14 DIAGNOSIS — N649 Disorder of breast, unspecified: Secondary | ICD-10-CM | POA: Diagnosis not present

## 2014-11-14 DIAGNOSIS — R928 Other abnormal and inconclusive findings on diagnostic imaging of breast: Secondary | ICD-10-CM | POA: Diagnosis not present

## 2014-11-14 LAB — HM MAMMOGRAPHY: HM MAMMO: NEGATIVE

## 2014-11-19 ENCOUNTER — Encounter (HOSPITAL_COMMUNITY)
Admission: RE | Admit: 2014-11-19 | Discharge: 2014-11-19 | Disposition: A | Discharge status: Home / Self Care | Payer: Medicare Other | Source: Ambulatory Visit | Attending: Neurosurgery | Admitting: Neurosurgery

## 2014-11-19 ENCOUNTER — Encounter (HOSPITAL_COMMUNITY): Payer: Self-pay

## 2014-11-19 DIAGNOSIS — I10 Essential (primary) hypertension: Secondary | ICD-10-CM | POA: Diagnosis not present

## 2014-11-19 DIAGNOSIS — M5012 Cervical disc disorder with radiculopathy, mid-cervical region: Secondary | ICD-10-CM | POA: Diagnosis not present

## 2014-11-19 DIAGNOSIS — M4722 Other spondylosis with radiculopathy, cervical region: Secondary | ICD-10-CM | POA: Diagnosis not present

## 2014-11-19 DIAGNOSIS — D649 Anemia, unspecified: Secondary | ICD-10-CM | POA: Diagnosis not present

## 2014-11-19 HISTORY — DX: Anemia, unspecified: D64.9

## 2014-11-19 HISTORY — DX: Unspecified glaucoma: H40.9

## 2014-11-19 HISTORY — DX: Gastro-esophageal reflux disease without esophagitis: K21.9

## 2014-11-19 HISTORY — DX: Sleep apnea, unspecified: G47.30

## 2014-11-19 HISTORY — DX: Myoneural disorder, unspecified: G70.9

## 2014-11-19 LAB — BASIC METABOLIC PANEL
Anion gap: 9 (ref 5–15)
BUN: 16 mg/dL (ref 6–23)
CHLORIDE: 105 mmol/L (ref 96–112)
CO2: 25 mmol/L (ref 19–32)
Calcium: 9.8 mg/dL (ref 8.4–10.5)
Creatinine, Ser: 0.77 mg/dL (ref 0.50–1.10)
GFR calc Af Amer: >90 mL/min (ref >90)
GFR calc non Af Amer: 81 mL/min — ABNORMAL LOW (ref >90)
Glucose, Bld: 98 mg/dL (ref 70–99)
Potassium: 4 mmol/L (ref 3.5–5.1)
Sodium: 139 mmol/L (ref 135–145)

## 2014-11-19 LAB — CBC
HCT: 37.4 % (ref 36.0–46.0)
Hemoglobin: 12.3 g/dL (ref 12.0–15.0)
MCH: 32.1 pg (ref 26.0–34.0)
MCHC: 32.9 g/dL (ref 30.0–36.0)
MCV: 97.7 fL (ref 78.0–100.0)
PLATELETS: 288 10*3/uL (ref 150–400)
RBC: 3.83 MIL/uL — ABNORMAL LOW (ref 3.87–5.11)
RDW: 13.6 % (ref 11.5–15.5)
WBC: 5.3 10*3/uL (ref 4.0–10.5)

## 2014-11-19 LAB — SURGICAL PCR SCREEN
MRSA, PCR: NEGATIVE
STAPHYLOCOCCUS AUREUS: NEGATIVE

## 2014-11-19 NOTE — Pre-Procedure Instructions (Signed)
Leah Huber  11/19/2014  Your procedure is scheduled on:  11-28-2014     Wednesday   Report to [facility] Admitting at 6:00 AM.   Call this number if you have problems the morning of surgery: [phone]   Remember:   Do not eat food or drink liquids after midnight.    Take these medicines the morning of surgery with A SIP OF WATER: Lumigan eye gtts.,Detral LA,protonix,   Do not wear jewelry, make-up or nail polish.  Do not wear lotions, powders, or perfumes. You may not wear deodorant.    Do not shave 48 hours prior to surgery.   Do not bring valuables to the hospital.  [facility] is not responsible  for any belongings or valuables.               Contacts, dentures or bridgework may not be worn into surgery.   Leave suitcase in the car. After surgery it may be brought to your room.   For patients admitted to the hospital, discharge time is determined by your  treatment team.               Patients discharged the day of surgery will not be allowed to drive home.     Special Instructions: See attached sheet for instructions on CHG  Shower/bath     Please read over the following fact sheets that you were given: Pain Booklet and Surgical Site Infection Prevention

## 2014-11-19 NOTE — Progress Notes (Signed)
Last OV with Dr. [facility] requested.

## 2014-11-22 ENCOUNTER — Other Ambulatory Visit: Payer: Self-pay | Admitting: Internal Medicine

## 2014-11-27 MED ORDER — CEFAZOLIN SODIUM-DEXTROSE 2-3 GM-% IV SOLR
2.0000 g | INTRAVENOUS | Status: AC
  Administered 2014-11-28: 2 g via INTRAVENOUS
  Filled 2014-11-27: qty 50

## 2014-11-28 ENCOUNTER — Inpatient Hospital Stay (HOSPITAL_COMMUNITY): Payer: Medicare Other | Admitting: Anesthesiology

## 2014-11-28 ENCOUNTER — Inpatient Hospital Stay (HOSPITAL_COMMUNITY): Payer: Medicare Other

## 2014-11-28 ENCOUNTER — Inpatient Hospital Stay (HOSPITAL_COMMUNITY)
Admission: RE | Admit: 2014-11-28 | Discharge: 2014-11-29 | DRG: 473 | Disposition: A | Discharge status: Home / Self Care | Payer: Medicare Other | Source: Ambulatory Visit | Attending: Neurosurgery | Admitting: Neurosurgery

## 2014-11-28 ENCOUNTER — Encounter (HOSPITAL_COMMUNITY): Payer: Self-pay | Admitting: *Deleted

## 2014-11-28 ENCOUNTER — Encounter (HOSPITAL_COMMUNITY)
Admission: RE | Disposition: A | Discharge status: Home / Self Care | Payer: Self-pay | Source: Ambulatory Visit | Attending: Neurosurgery

## 2014-11-28 DIAGNOSIS — Z9049 Acquired absence of other specified parts of digestive tract: Secondary | ICD-10-CM | POA: Diagnosis present

## 2014-11-28 DIAGNOSIS — M4722 Other spondylosis with radiculopathy, cervical region: Secondary | ICD-10-CM | POA: Diagnosis present

## 2014-11-28 DIAGNOSIS — I10 Essential (primary) hypertension: Secondary | ICD-10-CM | POA: Diagnosis present

## 2014-11-28 DIAGNOSIS — E780 Pure hypercholesterolemia: Secondary | ICD-10-CM | POA: Diagnosis present

## 2014-11-28 DIAGNOSIS — M542 Cervicalgia: Secondary | ICD-10-CM | POA: Diagnosis not present

## 2014-11-28 DIAGNOSIS — Z881 Allergy status to other antibiotic agents status: Secondary | ICD-10-CM

## 2014-11-28 DIAGNOSIS — Z87891 Personal history of nicotine dependence: Secondary | ICD-10-CM | POA: Diagnosis not present

## 2014-11-28 DIAGNOSIS — K219 Gastro-esophageal reflux disease without esophagitis: Secondary | ICD-10-CM | POA: Diagnosis present

## 2014-11-28 DIAGNOSIS — Z86010 Personal history of colonic polyps: Secondary | ICD-10-CM | POA: Diagnosis not present

## 2014-11-28 DIAGNOSIS — M4800 Spinal stenosis, site unspecified: Secondary | ICD-10-CM

## 2014-11-28 DIAGNOSIS — D649 Anemia, unspecified: Secondary | ICD-10-CM | POA: Diagnosis present

## 2014-11-28 DIAGNOSIS — G4730 Sleep apnea, unspecified: Secondary | ICD-10-CM | POA: Diagnosis present

## 2014-11-28 DIAGNOSIS — M5032 Other cervical disc degeneration, mid-cervical region: Secondary | ICD-10-CM | POA: Diagnosis not present

## 2014-11-28 DIAGNOSIS — Z79899 Other long term (current) drug therapy: Secondary | ICD-10-CM

## 2014-11-28 DIAGNOSIS — Z8249 Family history of ischemic heart disease and other diseases of the circulatory system: Secondary | ICD-10-CM | POA: Diagnosis not present

## 2014-11-28 DIAGNOSIS — M5030 Other cervical disc degeneration, unspecified cervical region: Secondary | ICD-10-CM | POA: Diagnosis not present

## 2014-11-28 DIAGNOSIS — Z90710 Acquired absence of both cervix and uterus: Secondary | ICD-10-CM

## 2014-11-28 DIAGNOSIS — Z882 Allergy status to sulfonamides status: Secondary | ICD-10-CM

## 2014-11-28 DIAGNOSIS — M47812 Spondylosis without myelopathy or radiculopathy, cervical region: Secondary | ICD-10-CM | POA: Diagnosis not present

## 2014-11-28 DIAGNOSIS — H409 Unspecified glaucoma: Secondary | ICD-10-CM | POA: Diagnosis present

## 2014-11-28 DIAGNOSIS — Z96653 Presence of artificial knee joint, bilateral: Secondary | ICD-10-CM | POA: Diagnosis present

## 2014-11-28 DIAGNOSIS — F329 Major depressive disorder, single episode, unspecified: Secondary | ICD-10-CM | POA: Diagnosis present

## 2014-11-28 DIAGNOSIS — M1990 Unspecified osteoarthritis, unspecified site: Secondary | ICD-10-CM | POA: Diagnosis present

## 2014-11-28 DIAGNOSIS — M5012 Cervical disc disorder with radiculopathy, mid-cervical region: Secondary | ICD-10-CM | POA: Diagnosis present

## 2014-11-28 DIAGNOSIS — M4802 Spinal stenosis, cervical region: Secondary | ICD-10-CM | POA: Diagnosis not present

## 2014-11-28 HISTORY — PX: ANTERIOR CERVICAL DECOMP/DISCECTOMY FUSION: SHX1161

## 2014-11-28 SURGERY — ANTERIOR CERVICAL DECOMPRESSION/DISCECTOMY FUSION 1 LEVEL
Anesthesia: General | Site: Spine Cervical

## 2014-11-28 MED ORDER — DEXAMETHASONE SODIUM PHOSPHATE 4 MG/ML IJ SOLN: 4.0000 mg | Freq: Four times a day (QID) | INTRAMUSCULAR | Status: AC

## 2014-11-28 MED ORDER — ROCURONIUM BROMIDE 100 MG/10ML IV SOLN
INTRAVENOUS | Status: DC | PRN
  Administered 2014-11-28: 40 mg via INTRAVENOUS

## 2014-11-28 MED ORDER — GLYCOPYRROLATE 0.2 MG/ML IJ SOLN
INTRAMUSCULAR | Status: DC | PRN
  Administered 2014-11-28: 0.4 mg via INTRAVENOUS

## 2014-11-28 MED ORDER — ONDANSETRON HCL 4 MG/2ML IJ SOLN
INTRAMUSCULAR | Status: AC
  Filled 2014-11-28: qty 2

## 2014-11-28 MED ORDER — PANTOPRAZOLE SODIUM 40 MG PO TBEC
40.0000 mg | DELAYED_RELEASE_TABLET | Freq: Two times a day (BID) | ORAL | Status: DC
  Administered 2014-11-28 – 2014-11-29 (×2): 40 mg via ORAL
  Filled 2014-11-28 (×2): qty 1

## 2014-11-28 MED ORDER — PHENYLEPHRINE HCL 10 MG/ML IJ SOLN
10.0000 mg | INTRAVENOUS | Status: DC | PRN
  Administered 2014-11-28: 25 ug/min via INTRAVENOUS

## 2014-11-28 MED ORDER — ARTIFICIAL TEARS OP OINT
TOPICAL_OINTMENT | OPHTHALMIC | Status: AC
  Filled 2014-11-28: qty 3.5

## 2014-11-28 MED ORDER — FLEET ENEMA 7-19 GM/118ML RE ENEM
1.0000 | ENEMA | Freq: Once | RECTAL | Status: AC | PRN
  Filled 2014-11-28: qty 1

## 2014-11-28 MED ORDER — BIMATOPROST 0.03 % OP SOLN
1.0000 [drp] | Freq: Every day | OPHTHALMIC | Status: DC
  Filled 2014-11-28: qty 2.5

## 2014-11-28 MED ORDER — EPHEDRINE SULFATE 50 MG/ML IJ SOLN
INTRAMUSCULAR | Status: AC
  Filled 2014-11-28: qty 1

## 2014-11-28 MED ORDER — ONDANSETRON HCL 4 MG/2ML IJ SOLN
INTRAMUSCULAR | Status: DC | PRN
  Administered 2014-11-28: 4 mg via INTRAVENOUS

## 2014-11-28 MED ORDER — KETOROLAC TROMETHAMINE 30 MG/ML IJ SOLN
INTRAMUSCULAR | Status: DC | PRN
  Administered 2014-11-28: 30 mg via INTRAVENOUS

## 2014-11-28 MED ORDER — DIAZEPAM 5 MG PO TABS
5.0000 mg | ORAL_TABLET | Freq: Four times a day (QID) | ORAL | Status: DC | PRN
  Administered 2014-11-28 – 2014-11-29 (×3): 5 mg via ORAL
  Filled 2014-11-28 (×2): qty 1

## 2014-11-28 MED ORDER — BISACODYL 10 MG RE SUPP: 10.0000 mg | Freq: Every day | RECTAL | Status: DC | PRN

## 2014-11-28 MED ORDER — PHENYLEPHRINE 40 MCG/ML (10ML) SYRINGE FOR IV PUSH (FOR BLOOD PRESSURE SUPPORT)
PREFILLED_SYRINGE | INTRAVENOUS | Status: AC
Start: 2014-11-28 — End: 2014-11-28
  Filled 2014-11-28: qty 10

## 2014-11-28 MED ORDER — HEMOSTATIC AGENTS (NO CHARGE) OPTIME
TOPICAL | Status: DC | PRN
  Administered 2014-11-28: 1 via TOPICAL

## 2014-11-28 MED ORDER — ALUM & MAG HYDROXIDE-SIMETH 200-200-20 MG/5ML PO SUSP: 30.0000 mL | Freq: Four times a day (QID) | ORAL | Status: DC | PRN

## 2014-11-28 MED ORDER — SODIUM CHLORIDE 0.9 % IJ SOLN
INTRAMUSCULAR | Status: AC
  Filled 2014-11-28: qty 20

## 2014-11-28 MED ORDER — DEXAMETHASONE SODIUM PHOSPHATE 10 MG/ML IJ SOLN
INTRAMUSCULAR | Status: AC
  Filled 2014-11-28: qty 2

## 2014-11-28 MED ORDER — LACTATED RINGERS IV SOLN
INTRAVENOUS | Status: DC | PRN
  Administered 2014-11-28 (×2): via INTRAVENOUS

## 2014-11-28 MED ORDER — ONDANSETRON HCL 4 MG/2ML IJ SOLN: 4.0000 mg | INTRAMUSCULAR | Status: DC | PRN

## 2014-11-28 MED ORDER — HYDROMORPHONE HCL 1 MG/ML IJ SOLN
INTRAMUSCULAR | Status: AC
  Filled 2014-11-28: qty 1

## 2014-11-28 MED ORDER — FENTANYL CITRATE 0.05 MG/ML IJ SOLN
INTRAMUSCULAR | Status: DC | PRN
  Administered 2014-11-28: 50 ug via INTRAVENOUS
  Administered 2014-11-28: 100 ug via INTRAVENOUS
  Administered 2014-11-28 (×2): 50 ug via INTRAVENOUS

## 2014-11-28 MED ORDER — MORPHINE SULFATE 2 MG/ML IJ SOLN: 1.0000 mg | INTRAMUSCULAR | Status: DC | PRN

## 2014-11-28 MED ORDER — PROPOFOL 10 MG/ML IV BOLUS
INTRAVENOUS | Status: AC
  Filled 2014-11-28: qty 20

## 2014-11-28 MED ORDER — PROPOFOL 10 MG/ML IV BOLUS
INTRAVENOUS | Status: DC | PRN
  Administered 2014-11-28: 150 mg via INTRAVENOUS

## 2014-11-28 MED ORDER — LIDOCAINE HCL (CARDIAC) 20 MG/ML IV SOLN
INTRAVENOUS | Status: AC
  Filled 2014-11-28: qty 5

## 2014-11-28 MED ORDER — MENTHOL 3 MG MT LOZG: 1.0000 | LOZENGE | OROMUCOSAL | Status: DC | PRN

## 2014-11-28 MED ORDER — ACETAMINOPHEN 325 MG PO TABS: 650.0000 mg | ORAL_TABLET | ORAL | Status: DC | PRN

## 2014-11-28 MED ORDER — THROMBIN 5000 UNITS EX SOLR
CUTANEOUS | Status: DC | PRN
  Administered 2014-11-28 (×2): 5000 [IU] via TOPICAL

## 2014-11-28 MED ORDER — KETOROLAC TROMETHAMINE 30 MG/ML IJ SOLN
INTRAMUSCULAR | Status: AC
  Filled 2014-11-28: qty 1

## 2014-11-28 MED ORDER — CEFAZOLIN SODIUM-DEXTROSE 2-3 GM-% IV SOLR
2.0000 g | Freq: Three times a day (TID) | INTRAVENOUS | Status: AC
  Administered 2014-11-28 (×2): 2 g via INTRAVENOUS
  Filled 2014-11-28 (×2): qty 50

## 2014-11-28 MED ORDER — SODIUM CHLORIDE 0.9 % IR SOLN
Status: DC | PRN
  Administered 2014-11-28: 11:00:00

## 2014-11-28 MED ORDER — IBUPROFEN 200 MG PO TABS: 400.0000 mg | ORAL_TABLET | Freq: Four times a day (QID) | ORAL | Status: DC | PRN

## 2014-11-28 MED ORDER — GLYCOPYRROLATE 0.2 MG/ML IJ SOLN
INTRAMUSCULAR | Status: AC
  Filled 2014-11-28: qty 3

## 2014-11-28 MED ORDER — LIDOCAINE HCL (CARDIAC) 20 MG/ML IV SOLN
INTRAVENOUS | Status: DC | PRN
  Administered 2014-11-28: 70 mg via INTRAVENOUS

## 2014-11-28 MED ORDER — HYDROMORPHONE HCL 1 MG/ML IJ SOLN
0.2500 mg | INTRAMUSCULAR | Status: DC | PRN
  Administered 2014-11-28: 0.25 mg via INTRAVENOUS

## 2014-11-28 MED ORDER — IRBESARTAN 75 MG PO TABS
75.0000 mg | ORAL_TABLET | Freq: Every day | ORAL | Status: DC
  Administered 2014-11-28 – 2014-11-29 (×2): 75 mg via ORAL
  Filled 2014-11-28 (×2): qty 1

## 2014-11-28 MED ORDER — FESOTERODINE FUMARATE ER 4 MG PO TB24
4.0000 mg | ORAL_TABLET | Freq: Every day | ORAL | Status: DC
  Administered 2014-11-28: 4 mg via ORAL
  Filled 2014-11-28 (×2): qty 1

## 2014-11-28 MED ORDER — BUPIVACAINE-EPINEPHRINE (PF) 0.5% -1:200000 IJ SOLN
INTRAMUSCULAR | Status: DC | PRN
  Administered 2014-11-28: 10 mL

## 2014-11-28 MED ORDER — HYDROCODONE-ACETAMINOPHEN 5-325 MG PO TABS
1.0000 | ORAL_TABLET | ORAL | Status: DC | PRN
  Administered 2014-11-28 – 2014-11-29 (×3): 2 via ORAL
  Filled 2014-11-28 (×3): qty 2

## 2014-11-28 MED ORDER — DIAZEPAM 5 MG PO TABS
ORAL_TABLET | ORAL | Status: AC
  Filled 2014-11-28: qty 1

## 2014-11-28 MED ORDER — ROCURONIUM BROMIDE 50 MG/5ML IV SOLN
INTRAVENOUS | Status: AC
  Filled 2014-11-28: qty 1

## 2014-11-28 MED ORDER — NEOSTIGMINE METHYLSULFATE 10 MG/10ML IV SOLN
INTRAVENOUS | Status: DC | PRN
  Administered 2014-11-28: 3 mg via INTRAVENOUS

## 2014-11-28 MED ORDER — PHENYLEPHRINE HCL 10 MG/ML IJ SOLN
INTRAMUSCULAR | Status: DC | PRN
  Administered 2014-11-28: 80 ug via INTRAVENOUS

## 2014-11-28 MED ORDER — DEXAMETHASONE SODIUM PHOSPHATE 10 MG/ML IJ SOLN
INTRAMUSCULAR | Status: DC | PRN
  Administered 2014-11-28: 10 mg via INTRAVENOUS

## 2014-11-28 MED ORDER — OXYCODONE-ACETAMINOPHEN 5-325 MG PO TABS: 1.0000 | ORAL_TABLET | ORAL | Status: DC | PRN

## 2014-11-28 MED ORDER — DEXAMETHASONE 4 MG PO TABS
4.0000 mg | ORAL_TABLET | Freq: Four times a day (QID) | ORAL | Status: AC
  Administered 2014-11-28 (×2): 4 mg via ORAL
  Filled 2014-11-28 (×2): qty 1

## 2014-11-28 MED ORDER — ARTIFICIAL TEARS OP OINT
TOPICAL_OINTMENT | OPHTHALMIC | Status: DC | PRN
  Administered 2014-11-28: 1 via OPHTHALMIC

## 2014-11-28 MED ORDER — 0.9 % SODIUM CHLORIDE (POUR BTL) OPTIME
TOPICAL | Status: DC | PRN
  Administered 2014-11-28: 1000 mL

## 2014-11-28 MED ORDER — SODIUM CHLORIDE 0.9 % IJ SOLN
INTRAMUSCULAR | Status: AC
  Filled 2014-11-28: qty 10

## 2014-11-28 MED ORDER — ARTIFICIAL TEARS OP OINT
TOPICAL_OINTMENT | OPHTHALMIC | Status: AC
  Filled 2014-11-28: qty 14

## 2014-11-28 MED ORDER — LACTATED RINGERS IV SOLN: INTRAVENOUS | Status: DC

## 2014-11-28 MED ORDER — PRAVASTATIN SODIUM 40 MG PO TABS
40.0000 mg | ORAL_TABLET | Freq: Every day | ORAL | Status: DC
  Administered 2014-11-28: 40 mg via ORAL
  Filled 2014-11-28 (×2): qty 1

## 2014-11-28 MED ORDER — PROMETHAZINE HCL 25 MG/ML IJ SOLN: 6.2500 mg | INTRAMUSCULAR | Status: DC | PRN

## 2014-11-28 MED ORDER — DOCUSATE SODIUM 100 MG PO CAPS
100.0000 mg | ORAL_CAPSULE | Freq: Two times a day (BID) | ORAL | Status: DC
  Administered 2014-11-28 – 2014-11-29 (×2): 100 mg via ORAL
  Filled 2014-11-28 (×2): qty 1

## 2014-11-28 MED ORDER — PHENOL 1.4 % MT LIQD
1.0000 | OROMUCOSAL | Status: DC | PRN
  Administered 2014-11-28: 1 via OROMUCOSAL
  Filled 2014-11-28: qty 177

## 2014-11-28 MED ORDER — FENTANYL CITRATE 0.05 MG/ML IJ SOLN
INTRAMUSCULAR | Status: AC
  Filled 2014-11-28: qty 5

## 2014-11-28 MED ORDER — SUCCINYLCHOLINE CHLORIDE 20 MG/ML IJ SOLN
INTRAMUSCULAR | Status: AC
  Filled 2014-11-28: qty 1

## 2014-11-28 MED ORDER — ACETAMINOPHEN 650 MG RE SUPP: 650.0000 mg | RECTAL | Status: DC | PRN

## 2014-11-28 SURGICAL SUPPLY — 60 items
BAG DECANTER FOR FLEXI CONT (MISCELLANEOUS) ×3 IMPLANT
BENZOIN TINCTURE PRP APPL 2/3 (GAUZE/BANDAGES/DRESSINGS) ×3 IMPLANT
BIT DRILL NEURO 2X3.1 SFT TUCH (MISCELLANEOUS) ×1 IMPLANT
BLADE SURG 15 STRL LF DISP TIS (BLADE) ×1 IMPLANT
BLADE SURG 15 STRL SS (BLADE) ×2
BLADE ULTRA TIP 2M (BLADE) ×3 IMPLANT
BRUSH SCRUB EZ PLAIN DRY (MISCELLANEOUS) ×3 IMPLANT
BUR BARREL STRAIGHT FLUTE 4.0 (BURR) ×3 IMPLANT
BUR MATCHSTICK NEURO 3.0 LAGG (BURR) ×3 IMPLANT
CANISTER SUCT 3000ML PPV (MISCELLANEOUS) ×3 IMPLANT
CLOSURE WOUND 1/2 X4 (GAUZE/BANDAGES/DRESSINGS) ×1
CONT SPEC 4OZ CLIKSEAL STRL BL (MISCELLANEOUS) ×3 IMPLANT
COVER MAYO STAND STRL (DRAPES) ×3 IMPLANT
DRAPE LAPAROTOMY 100X72 PEDS (DRAPES) ×3 IMPLANT
DRAPE MICROSCOPE LEICA (MISCELLANEOUS) IMPLANT
DRAPE POUCH INSTRU U-SHP 10X18 (DRAPES) ×3 IMPLANT
DRAPE SURG 17X23 STRL (DRAPES) ×6 IMPLANT
DRILL NEURO 2X3.1 SOFT TOUCH (MISCELLANEOUS) ×3
ELECT REM PT RETURN 9FT ADLT (ELECTROSURGICAL) ×3
ELECTRODE REM PT RTRN 9FT ADLT (ELECTROSURGICAL) ×1 IMPLANT
GAUZE SPONGE 4X4 12PLY STRL (GAUZE/BANDAGES/DRESSINGS) ×3 IMPLANT
GAUZE SPONGE 4X4 16PLY XRAY LF (GAUZE/BANDAGES/DRESSINGS) IMPLANT
GLOVE BIO SURGEON STRL SZ8 (GLOVE) ×9 IMPLANT
GLOVE BIO SURGEON STRL SZ8.5 (GLOVE) ×6 IMPLANT
GLOVE BIOGEL PI IND STRL 7.0 (GLOVE) ×2 IMPLANT
GLOVE BIOGEL PI INDICATOR 7.0 (GLOVE) ×4
GLOVE ECLIPSE 6.5 STRL STRAW (GLOVE) ×9 IMPLANT
GLOVE EXAM NITRILE LRG STRL (GLOVE) IMPLANT
GLOVE EXAM NITRILE MD LF STRL (GLOVE) IMPLANT
GLOVE EXAM NITRILE XL STR (GLOVE) IMPLANT
GLOVE EXAM NITRILE XS STR PU (GLOVE) IMPLANT
GLOVE INDICATOR 8.5 STRL (GLOVE) ×3 IMPLANT
GOWN STRL REUS W/ TWL LRG LVL3 (GOWN DISPOSABLE) ×1 IMPLANT
GOWN STRL REUS W/ TWL XL LVL3 (GOWN DISPOSABLE) ×2 IMPLANT
GOWN STRL REUS W/TWL LRG LVL3 (GOWN DISPOSABLE) ×2
GOWN STRL REUS W/TWL XL LVL3 (GOWN DISPOSABLE) ×4
KIT BASIN OR (CUSTOM PROCEDURE TRAY) ×3 IMPLANT
KIT ROOM TURNOVER OR (KITS) ×3 IMPLANT
MARKER SKIN DUAL TIP RULER LAB (MISCELLANEOUS) ×3 IMPLANT
NEEDLE HYPO 22GX1.5 SAFETY (NEEDLE) ×3 IMPLANT
NEEDLE SPNL 18GX3.5 QUINCKE PK (NEEDLE) ×3 IMPLANT
NS IRRIG 1000ML POUR BTL (IV SOLUTION) ×3 IMPLANT
PACK LAMINECTOMY NEURO (CUSTOM PROCEDURE TRAY) ×3 IMPLANT
PEEK VISTA 14X14X8MM (Peek) ×3 IMPLANT
PIN DISTRACTION 14MM (PIN) ×6 IMPLANT
PLATE ANT CERV XTEND 1 LV 12 (Plate) ×3 IMPLANT
PUTTY KINEX BIOACTIVE 2CC (Bone Implant) ×3 IMPLANT
RUBBERBAND STERILE (MISCELLANEOUS) ×6 IMPLANT
SCREW XTD VAR 4.2 SELF TAP 12 (Screw) ×12 IMPLANT
SPONGE INTESTINAL PEANUT (DISPOSABLE) ×3 IMPLANT
SPONGE SURGIFOAM ABS GEL SZ50 (HEMOSTASIS) ×3 IMPLANT
STRIP CLOSURE SKIN 1/2X4 (GAUZE/BANDAGES/DRESSINGS) ×2 IMPLANT
SUT VIC AB 0 CT1 27 (SUTURE) ×2
SUT VIC AB 0 CT1 27XBRD ANTBC (SUTURE) ×1 IMPLANT
SUT VIC AB 3-0 SH 8-18 (SUTURE) ×3 IMPLANT
SYR 20ML ECCENTRIC (SYRINGE) ×3 IMPLANT
TAPE CLOTH SURG 4X10 WHT LF (GAUZE/BANDAGES/DRESSINGS) ×3 IMPLANT
TOWEL OR 17X24 6PK STRL BLUE (TOWEL DISPOSABLE) ×3 IMPLANT
TOWEL OR 17X26 10 PK STRL BLUE (TOWEL DISPOSABLE) ×3 IMPLANT
WATER STERILE IRR 1000ML POUR (IV SOLUTION) ×3 IMPLANT

## 2014-11-28 NOTE — Anesthesia Preprocedure Evaluation (Addendum)
Anesthesia Evaluation  Patient identified by MRN, date of birth, ID band  Reviewed: Allergy & Precautions, NPO status , Patient's Chart, lab work & pertinent test results  Airway Mallampati: II  TM Distance: >3 FB Neck ROM: Full    Dental   Pulmonary sleep apnea , former smoker,  breath sounds clear to auscultation        Cardiovascular hypertension, + Peripheral Vascular Disease Rhythm:Regular Rate:Normal     Neuro/Psych    GI/Hepatic Neg liver ROS, GERD-  ,  Endo/Other  negative endocrine ROS  Renal/GU negative Renal ROS     Musculoskeletal  (+) Arthritis -,   Abdominal   Peds  Hematology   Anesthesia Other Findings   Reproductive/Obstetrics                            Anesthesia Physical Anesthesia Plan  ASA: III  Anesthesia Plan: General   Post-op Pain Management:    Induction: Intravenous  Airway Management Planned: Oral ETT  Additional Equipment:   Intra-op Plan:   Post-operative Plan: Extubation in OR  Informed Consent: I have reviewed the patients History and Physical, chart, labs and discussed the procedure including the risks, benefits and alternatives for the proposed anesthesia with the patient or authorized representative who has indicated his/her understanding and acceptance.   Dental advisory given  Plan Discussed with: CRNA and Anesthesiologist  Anesthesia Plan Comments:        Anesthesia Quick Evaluation

## 2014-11-28 NOTE — Op Note (Signed)
Brief history: The patient is a 74 year old white female who has complained of left greater right neck and arm pain. She has failed medical management and was worked up with a cervical MRI. This demonstrated the patient had multilevel degenerative changes worse at C5-6. I discussed the various treatment options with the patient including surgery. She has weighed the risks, benefits, and alternative surgery and decided proceed with a C5-6 anterior cervical discectomy, fusion, and plating.  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 Earle Gell  Asst.: Kary Kos  Anesthesia: Gen. endotracheal  Estimated blood loss: Minimal  Drains: None  Complications: None  Description of procedure: The patient was brought to the operating room by the anesthesia team. General endotracheal anesthesia was induced. A roll was placed under the patient's shoulders to keep the neck in the neutral position. The patient's anterior cervical region was then prepared with Betadine scrub and Betadine solution. Sterile drapes were applied.  The area to be incised was then injected with Marcaine with epinephrine solution. I then used a scalpel to make a transverse incision in the patient's left anterior neck. I used the Metzenbaum scissors to divide the platysmal muscle and then to dissect medial to the sternocleidomastoid muscle, jugular vein, and carotid artery. I carefully dissected down towards the anterior cervical spine identifying the esophagus and retracting it medially. Then using Kitner swabs to clear soft  tissue from the anterior cervical spine. We then inserted a bent spinal needle into the upper exposed intervertebral disc space. We then obtained intraoperative radiographs confirm our location.  I then used electrocautery to detach the medial border of the longus colli muscle bilaterally from the C5-6 intervertebral disc spaces. I then inserted the Caspar self-retaining retractor underneath the longus colli muscle bilaterally to provide exposure.  We then incised the intervertebral disc at C5-6. We then performed a partial intervertebral discectomy with a pituitary forceps and the Karlin curettes. I then inserted distraction screws into the vertebral bodies at C5-6. We then distracted the interspace. We then used the high-speed drill to decorticate the vertebral endplates at Z6-1, to drill away the remainder of the intervertebral disc, to drill away some posterior spondylosis, and to thin out the posterior longitudinal ligament. I then incised ligament with the arachnoid knife. We then removed the ligament with a Kerrison punches undercutting the vertebral endplates and decompressing the thecal sac. We then performed foraminotomies about the bilateral C6 nerve roots. This completed the decompression at this level.  We now turned our to attention to the interbody fusion. We used the trial spacers to determine the appropriate size for the interbody prosthesis. We then pre-filled prosthesis with a combination of local morcellized autograft bone that we obtained during decompression as well as Kinnex bone graft extender. We then inserted the prosthesis into the distracted interspace at the C5-6. We then removed the distraction screws. There was a good snug fit of the prosthesis in the interspace.  Having completed the fusion we now turned attention to the anterior spinal instrumentation. We used the high-speed drill to drill away some anterior spondylosis at the disc spaces so that the plate lay down flat. We  selected the appropriate length titanium anterior cervical plate. We laid it  along the anterior aspect of the vertebral bodies from C5-6. We then drilled 12 mm holes at the 5 and C6. We then secured the plate to the vertebral bodies by placing two 12 mm self-tapping screws at C5 and C6. We then obtained intraoperative radiograph. The demonstrating good position of the instrumentation. We therefore secured the screws the plate the locking each cam. This completed the instrumentation.  We then obtained hemostasis using bipolar electrocautery. We irrigated the wound out with bacitracin solution. We then removed the retractor. We inspected the esophagus for any damage. There was none apparent. We then reapproximated patient's platysmal muscle with interrupted 3-0 Vicryl suture. We then reapproximated the subcutaneous tissue with interrupted 3-0 Vicryl suture. The skin was reapproximated with Steri-Strips and benzoin. The wound was then covered with bacitracin ointment. A sterile dressing was applied. The drapes were removed. Patient was subsequently extubated by the anesthesia team and transported to the post anesthesia care unit in stable condition. All sponge instrument and needle counts were reportedly correct at the end of this case.

## 2014-11-28 NOTE — Anesthesia Postprocedure Evaluation (Signed)
  Anesthesia Post-op Note  Patient: Leah Huber  Procedure(s) Performed: Procedure(s) with comments: CERVICAL FIVE-SIX  ANTERIOR CERVICAL DECOMPRESSION/DISCECTOMY FUSION 1 LEVEL (N/A) - C56 anterior cervical decompression with fusion interbody prosthesis plating and bonegraft  Patient Location: PACU  Anesthesia Type:General  Level of Consciousness: awake  Airway and Oxygen Therapy: Patient Spontanous Breathing  Post-op Pain: mild  Post-op Assessment: Post-op Vital signs reviewed  Post-op Vital Signs: Reviewed  Last Vitals:  Filed Vitals:   11/28/14 1315  BP: 145/79  Pulse: 76  Temp:   Resp: 22    Complications: No apparent anesthesia complications

## 2014-11-28 NOTE — Progress Notes (Signed)
Subjective:  The patient is alert and pleasant. She is in no apparent distress. She looks well.  Objective: Vital signs in last 24 hours: Temp:  [97.9 F (36.6 C)-98.4 F (36.9 C)] 97.9 F (36.6 C) (03/23 1141) Pulse Rate:  [86-87] 87 (03/23 1145) Resp:  [18-36] 36 (03/23 1145) BP: (160-169)/(78-80) 160/80 mmHg (03/23 1144) SpO2:  [92 %-98 %] 92 % (03/23 1145)  Intake/Output from previous day:   Intake/Output this shift: Total I/O In: 1300 [I.V.:1300] Out: 50 [Blood:50]  Physical exam the patient is alert and pleasant. She is moving all 4 extremities well. Her dressing is clean and dry. There is no evidence of hematoma or shift.  Lab Results: No results for input(s): WBC, HGB, HCT, PLT in the last 72 hours. BMET No results for input(s): NA, K, CL, CO2, GLUCOSE, BUN, CREATININE, CALCIUM in the last 72 hours.  Studies/Results: Dg Cervical Spine 2-3 Views  11/28/2014   CLINICAL DATA:  C5-6 ACDF  EXAM: CERVICAL SPINE - 2-3 VIEW  COMPARISON:  MRI 08/16/2014  FINDINGS: Anterior localizing instruments at the C4-5 level.  IMPRESSION: Anterior localization of C4-5.   Electronically Signed   By: Rolm Baptise M.D.   On: 11/28/2014 12:06    Assessment/Plan: The patient is doing well.  LOS: 0 days     Nabilah Davoli D 11/28/2014, 12:12 PM

## 2014-11-28 NOTE — Progress Notes (Signed)
Utilization review completed.  

## 2014-11-28 NOTE — Plan of Care (Signed)
Problem: Consults Goal: Diagnosis - Spinal Surgery Outcome: Completed/Met Date Met:  11/28/14 Cervical Spine Fusion

## 2014-11-28 NOTE — H&P (Signed)
Subjective: Leah Huber is a 74 year old white female who has complained of neck and left greater than right arm pain consistent with a cervical radiculopathy. She has failed medical management and was worked up with a cervical MRI which demonstrated disc degeneration, spondylosis, stenosis most prominent at C5-6. I discussed the various treatment options with the patient including surgery. She has weighed the risks, benefits, and alternative surgery and decided proceed with a C5-6 anterior cervical discectomy, fusion, and plating.   Past Medical History  Diagnosis Date  . Arthritis   . Depression   . Diverticulosis   . Hypertension   . Hypercholesterolemia   . Colon polyp   . Urinary incontinence   . Hypovolemia 12/10    acute renal failure  . Sleep apnea     very mild   sleep study 8 yrs ago does not use CPAP  . GERD (gastroesophageal reflux disease)   . Anemia   . Neuromuscular disorder     occasionally tingling in hands  . Glaucoma     Past Surgical History  Procedure Laterality Date  . Cholecystectomy  1996  . Abdominal hysterectomy  1979  . Laminectomy  1983 & 1985  . Rotator cuff repair  2000 & 2004    left and right  . Total knee arthroplasty  10/2011 & 09/13    left and right  . Tendon repair right shoulder    . Spinal cord stimulator implant  03/1997  . Bladder surgery  1981  . Foot surgery  2005  . Breast reduction surgery    . Carpal tunnel release  2007  . Spinal cord stimulator removal  2007  . Back surgery      Allergies  Allergen Reactions  . Clindamycin/Lincomycin Other (See Comments)    C. Diff  . Bactrim [Sulfamethoxazole-Trimethoprim] Other (See Comments)    Abdominal cramps  . Sulfa Antibiotics Other (See Comments)    Abdominal pain    History  Substance Use Topics  . Smoking status: Former Smoker -- 1.00 packs/day for 20 years    Types: Cigarettes    Quit date: 09/07/1988  . Smokeless tobacco: Never Used  . Alcohol Use: 0.0 oz/week    0 Standard  drinks or equivalent per week     Comment: occasional    Family History  Problem Relation Age of Onset  . Heart disease Mother   . Stroke Mother   . Hypertension Mother   . Hyperlipidemia Father   . Heart disease Father   . Heart disease Brother     2 brothers  . Alzheimer's disease Brother   . Heart disease Sister     pacemaker   Prior to Admission medications   Medication Sig Start Date End Date Taking? Authorizing Provider  Bimatoprost (LUMIGAN OP) Apply 1 drop to eye daily. Each eye   Yes Historical Provider, MD  DETROL LA 4 MG 24 hr capsule TAKE 1 CAPSULE DAILY 11/22/14  Yes Einar Pheasant, MD  DIOVAN 80 MG tablet TAKE 1 TABLET DAILY   Yes Einar Pheasant, MD  ibuprofen (ADVIL,MOTRIN) 200 MG tablet Take 400 mg by mouth every 6 (six) hours as needed for mild pain.   Yes Historical Provider, MD  pantoprazole (PROTONIX) 40 MG tablet TAKE 1 TABLET TWICE A DAY 07/26/14  Yes Einar Pheasant, MD  pravastatin (PRAVACHOL) 40 MG tablet Take 1 tablet (40 mg total) by mouth daily. 08/26/14  Yes Einar Pheasant, MD     Review of Systems  Positive ROS: As  above  All other systems have been reviewed and were otherwise negative with the exception of those mentioned in the HPI and as above.  Objective: Vital signs in last 24 hours: Temp:  [98.4 F (36.9 C)] 98.4 F (36.9 C) (03/23 0626) Pulse Rate:  [86] 86 (03/23 0626) Resp:  [18] 18 (03/23 0626) BP: (169)/(78) 169/78 mmHg (03/23 0626) SpO2:  [98 %] 98 % (03/23 0626)  General Appearance: Alert, cooperative, no distress, Head: Normocephalic, without obvious abnormality, atraumatic Eyes: PERRL, conjunctiva/corneas clear, EOM's intact,    Ears: Normal  Throat: Normal  Neck: Supple, symmetrical, trachea midline, no adenopathy; thyroid: No enlargement/tenderness/nodules; no carotid bruit or JVD Back: Symmetric, no curvature, ROM normal, no CVA tenderness Lungs: Clear to auscultation bilaterally, respirations unlabored Heart: Regular  rate and rhythm, no murmur, rub or gallop Abdomen: Soft, non-tender,, no masses, no organomegaly Extremities: Extremities normal, atraumatic, no cyanosis or edema Pulses: 2+ and symmetric all extremities Skin: Skin color, texture, turgor normal, no rashes or lesions  NEUROLOGIC:   Mental status: alert and oriented, no aphasia, good attention span, Fund of knowledge/ memory ok Motor Exam - grossly normal Sensory Exam - grossly normal Reflexes:  Coordination - grossly normal Gait - grossly normal Balance - grossly normal Cranial Nerves: I: smell Not tested  II: visual acuity  OS: Normal  OD: Normal   II: visual fields Full to confrontation  II: pupils Equal, round, reactive to light  III,VII: ptosis None  III,IV,VI: extraocular muscles  Full ROM  V: mastication Normal  V: facial light touch sensation  Normal  V,VII: corneal reflex  Present  VII: facial muscle function - upper  Normal  VII: facial muscle function - lower Normal  VIII: hearing Not tested  IX: soft palate elevation  Normal  IX,X: gag reflex Present  XI: trapezius strength  5/5  XI: sternocleidomastoid strength 5/5  XI: neck flexion strength  5/5  XII: tongue strength  Normal    Data Review Lab Results  Component Value Date   WBC 5.3 11/19/2014   HGB 12.3 11/19/2014   HCT 37.4 11/19/2014   MCV 97.7 11/19/2014   PLT 288 11/19/2014   Lab Results  Component Value Date   NA 139 11/19/2014   K 4.0 11/19/2014   CL 105 11/19/2014   CO2 25 11/19/2014   BUN 16 11/19/2014   CREATININE 0.77 11/19/2014   GLUCOSE 98 11/19/2014   No results found for: INR, PROTIME  Assessment/Plan: 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 I have reviewed her imaging studies with her and pointed out the abnormalities. We have discussed the various treatment options including surgery. I described the surgical treatment option of a C5-6  anterior cervical discectomy, fusion, and plating. I have shown her surgical models. We have discussed the risks, benefits, alternatives, and likelihood of achieving goals with surgery. I have answered all the patient's questions. She has decided to proceed with surgery.   Sayge Salvato D 11/28/2014 9:32 AM

## 2014-11-28 NOTE — Anesthesia Procedure Notes (Signed)
Procedure Name: Intubation Date/Time: 11/28/2014 9:45 AM Performed by: Susa Loffler Pre-anesthesia Checklist: Patient identified, Timeout performed, Emergency Drugs available, Suction available and Patient being monitored Patient Re-evaluated:Patient Re-evaluated prior to inductionOxygen Delivery Method: Circle system utilized Preoxygenation: Pre-oxygenation with 100% oxygen Intubation Type: IV induction Ventilation: Mask ventilation without difficulty and Oral airway inserted - appropriate to patient size Laryngoscope Size: Mac and 3 Grade View: Grade I Tube type: Oral Tube size: 7.0 mm Airway Equipment and Method: Stylet and Oral airway Placement Confirmation: ETT inserted through vocal cords under direct vision,  positive ETCO2 and breath sounds checked- equal and bilateral Secured at: 21 cm Tube secured with: Tape Dental Injury: Teeth and Oropharynx as per pre-operative assessment

## 2014-11-28 NOTE — Progress Notes (Signed)
Orthopedic Tech Progress Note Patient Details:  Leah Huber 12-16-1940 [id number] Aspen cervical collar pre-fit by [employer]. Patient ID: Leah Huber, female   DOB: 1941-05-06, 74 y.o.   MRN: [id number]   Darrol Poke 11/28/2014, 2:36 PM

## 2014-11-28 NOTE — Transfer of Care (Signed)
Immediate Anesthesia Transfer of Care Note  Patient: Leah Huber  Procedure(s) Performed: Procedure(s) with comments: CERVICAL FIVE-SIX  ANTERIOR CERVICAL DECOMPRESSION/DISCECTOMY FUSION 1 LEVEL (N/A) - C56 anterior cervical decompression with fusion interbody prosthesis plating and bonegraft  Patient Location: PACU  Anesthesia Type:General  Level of Consciousness: awake, alert  and oriented  Airway & Oxygen Therapy: Patient Spontanous Breathing and Patient connected to nasal cannula oxygen  Post-op Assessment: Report given to RN and Post -op Vital signs reviewed and stable  Post vital signs: Reviewed and stable  Last Vitals:  Filed Vitals:   11/28/14 1141  BP:   Pulse:   Temp: 36.6 C  Resp:     Complications: No apparent anesthesia complications

## 2014-11-29 MED ORDER — DOCUSATE SODIUM 100 MG PO CAPS: 100.0000 mg | ORAL_CAPSULE | Freq: Two times a day (BID) | ORAL | Status: DC

## 2014-11-29 MED ORDER — HYDROCODONE-ACETAMINOPHEN 5-325 MG PO TABS: 1.0000 | ORAL_TABLET | ORAL | Status: DC | PRN

## 2014-11-29 MED ORDER — DIAZEPAM 2 MG PO TABS: 2.0000 mg | ORAL_TABLET | Freq: Four times a day (QID) | ORAL | Status: DC | PRN

## 2014-11-29 NOTE — Progress Notes (Signed)
Patient alert and oriented, mae's well, voiding adequate amount of urine, swallowing without difficulty, no c/o pain. Patient discharged home with family. Script and discharged instructions given to patient. Patient and family stated understanding of d/c instructions given and has an appointment with MD. Aisha Dazia Lippold RN 

## 2014-11-29 NOTE — Evaluation (Signed)
Physical Therapy Evaluation and D/C Patient Details Name: Leah Huber MRN: [id number] DOB: 04/22/1941 Today's Date: 11/29/2014   History of Present Illness  Pt admit for ACDF C5-6 for cervical spondylosis.   Clinical Impression  Pt admitted with above diagnosis. Pt currently without significant functional limitations and mobilizing independently.  Cervical education and precautions completed with pt.  Pt will not need skilled PT at this time as education complete.    Follow Up Recommendations No PT follow up    Equipment Recommendations  None recommended by PT    Recommendations for Other Services       Precautions / Restrictions Precautions Precautions: Fall;Cervical Required Braces or Orthoses: Cervical Brace Cervical Brace: At all times;Other (comment) (MD told pt she can wear as needed per pt) Restrictions Weight Bearing Restrictions: No      Mobility  Bed Mobility Overal bed mobility: Independent                Transfers Overall transfer level: Independent                  Ambulation/Gait Ambulation/Gait assistance: Independent Ambulation Distance (Feet): 300 Feet Assistive device: None Gait Pattern/deviations: WFL(Within Functional Limits)   Gait velocity interpretation: <1.8 ft/sec, indicative of risk for recurrent falls General Gait Details: No difficulties with ambulation without device.  No LOB.    Stairs            Wheelchair Mobility    Modified Rankin (Stroke Patients Only)       Balance Overall balance assessment: Independent;No apparent balance deficits (not formally assessed)                                           Pertinent Vitals/Pain Pain Assessment: No/denies pain  VSS    Home Living Family/patient expects to be discharged to:: Private residence Living Arrangements: Alone Available Help at Discharge: Family;Available PRN/intermittently Type of Home: House Home Access: Stairs to  enter Entrance Stairs-Rails: None Entrance Stairs-Number of Steps: 1 Home Layout: One level Home Equipment: None      Prior Function Level of Independence: Independent               Hand Dominance        Extremity/Trunk Assessment   Upper Extremity Assessment: Defer to OT evaluation           Lower Extremity Assessment: Overall WFL for tasks assessed      Cervical / Trunk Assessment: Normal  Communication   Communication: No difficulties  Cognition Arousal/Alertness: Awake/alert Behavior During Therapy: WFL for tasks assessed/performed Overall Cognitive Status: Within Functional Limits for tasks assessed                      General Comments General comments (skin integrity, edema, etc.): Discussed cervical handout at length regarding precautions for neck.  Discussed and demonstrated how to adjust brace and pt can do so independently.  Pt educated about ADL's and discussed not to spit in sink and use cup to spit in to brush teeth.  Pt aware not to flex or extend neck until MD clears her.  Went over bed mobility as well.  Pt aware of all precautions and how to follow them.     Exercises        Assessment/Plan    PT Assessment    PT Diagnosis Generalized weakness  PT Problem List    PT Treatment Interventions     PT Goals (Current goals can be found in the Care Plan section) Acute Rehab PT Goals Patient Stated Goal: to go home PT Goal Formulation: All assessment and education complete, DC therapy    Frequency     Barriers to discharge        Co-evaluation               End of Session Equipment Utilized During Treatment: Gait belt;Cervical collar Activity Tolerance: Patient tolerated treatment well Patient left: in chair;with call bell/phone within reach Nurse Communication: Mobility status         Time: 7414-2395 PT Time Calculation (min) (ACUTE ONLY): 8 min   Charges:   PT Evaluation $Initial PT Evaluation Tier I: 1  Procedure     PT G CodesDenice Paradise 22-Dec-2014, 2:40 PM Pryor Guettler [facility] [phone] (pager)

## 2014-11-30 NOTE — Discharge Summary (Signed)
Physician Discharge Summary  Patient ID: KIYO HEAL MRN: [id number] DOB/AGE: 75-Nov-1942 75 y.o.  Admit date: 11/28/2014 Discharge date: 11/29/14  Admission Diagnoses:C5 C6 spondylosis and disc degeneration, stenosis, cervicalgia, cervical radiculopathy  Discharge Diagnoses: The same Active Problems:   Cervical spondylosis with radiculopathy   Discharged Condition: good  Hospital Course: I performed a C5 C6 anterior cervical discectomy, fusion, and plating on the patient on 11/28/14.  The surgery went well.  The patient's postoperative course was unremarkable.  On postoperative day #1 the patient requested discharge to home.  The patient was given oral and written discharge instructions.  All her questions were answered.  Consults:None Significant Diagnostic Studies:None Treatments:C5 C6 anterior cervical discectomy, fusion, and plating. Discharge Exam: Blood pressure 133/85, pulse 77, temperature 98.4 F (36.9 C), temperature source Oral, resp. rate 20, height 5\' 4"  (1.626 m), last menstrual period 08/15/1978, SpO2 97 %. The patient is alert and pleasant.  She looks well.  She is in no apparent distress.  Her dressing is clean and dry.  There is no evidence of hematoma or shift.  The patient's strength is normal all 4 extremities.  Disposition: Home  Discharge Instructions    Call MD for:  difficulty breathing, headache or visual disturbances    Complete by:  As directed      Call MD for:  extreme fatigue    Complete by:  As directed      Call MD for:  hives    Complete by:  As directed      Call MD for:  persistant dizziness or light-headedness    Complete by:  As directed      Call MD for:  persistant nausea and vomiting    Complete by:  As directed      Call MD for:  redness, tenderness, or signs of infection (pain, swelling, redness, odor or green/yellow discharge around incision site)    Complete by:  As directed      Call MD for:  severe uncontrolled pain     Complete by:  As directed      Call MD for:  temperature >100.4    Complete by:  As directed      Diet - low sodium heart healthy    Complete by:  As directed      Discharge instructions    Complete by:  As directed   Call [phone] for a followup appointment. Take a stool softener while you are using pain medications.     Driving Restrictions    Complete by:  As directed   Do not drive for 2 weeks.     Increase activity slowly    Complete by:  As directed      Lifting restrictions    Complete by:  As directed   Do not lift more than 5 pounds. No excessive bending or twisting.     May shower / Bathe    Complete by:  As directed   He may shower after the pain she is removed 3 days after surgery. Leave the incision alone.     Remove dressing in 48 hours    Complete by:  As directed   Your stitches are under the scan and will dissolve by themselves. The Steri-Strips will fall off after you take a few showers. Do not rub back or pick at the wound, Leave the wound alone.            Medication List    STOP taking these  medications        ibuprofen 200 MG tablet  Commonly known as:  ADVIL,MOTRIN      TAKE these medications        DETROL LA 4 MG 24 hr capsule  Generic drug:  tolterodine  TAKE 1 CAPSULE DAILY     diazepam 2 MG tablet  Commonly known as:  VALIUM  Take 1 tablet (2 mg total) by mouth every 6 (six) hours as needed for muscle spasms.     DIOVAN 80 MG tablet  Generic drug:  valsartan  TAKE 1 TABLET DAILY     docusate sodium 100 MG capsule  Commonly known as:  COLACE  Take 1 capsule (100 mg total) by mouth 2 (two) times daily.     HYDROcodone-acetaminophen 5-325 MG per tablet  Commonly known as:  NORCO/VICODIN  Take 1-2 tablets by mouth every 4 (four) hours as needed for moderate pain.     LUMIGAN OP  Apply 1 drop to eye daily. Each eye     pantoprazole 40 MG tablet  Commonly known as:  PROTONIX  TAKE 1 TABLET TWICE A DAY     pravastatin 40 MG  tablet  Commonly known as:  PRAVACHOL  Take 1 tablet (40 mg total) by mouth daily.           Follow-up Information    Follow up with Ophelia Charter, MD.   Specialty:  Neurosurgery   Contact information:   [address] [place] [address] [phone]       Signed: Ophelia Charter 11/30/2014, 9:39 AM

## 2014-12-01 ENCOUNTER — Telehealth: Payer: Self-pay | Admitting: Internal Medicine

## 2014-12-01 NOTE — Telephone Encounter (Signed)
Pt notified of mammogram results - with question of need for further evaluation.

## 2014-12-03 ENCOUNTER — Encounter (HOSPITAL_COMMUNITY): Payer: Self-pay | Admitting: Neurosurgery

## 2014-12-15 ENCOUNTER — Other Ambulatory Visit: Payer: Self-pay | Admitting: Internal Medicine

## 2014-12-25 DIAGNOSIS — I10 Essential (primary) hypertension: Secondary | ICD-10-CM | POA: Diagnosis not present

## 2014-12-25 DIAGNOSIS — Z6828 Body mass index (BMI) 28.0-28.9, adult: Secondary | ICD-10-CM | POA: Diagnosis not present

## 2014-12-25 DIAGNOSIS — M542 Cervicalgia: Secondary | ICD-10-CM | POA: Diagnosis not present

## 2014-12-25 DIAGNOSIS — M4802 Spinal stenosis, cervical region: Secondary | ICD-10-CM | POA: Diagnosis not present

## 2014-12-25 NOTE — Discharge Summary (Signed)
PATIENT NAME:  Leah Huber, WENTLAND MR#:  [id number] DATE OF BIRTH:  [birth year]  DATE OF ADMISSION:  08/25/2012 DATE OF DISCHARGE:  08/27/2012  PRIMARY CARE PHYSICIAN: Dr. Einar Pheasant.   CHIEF COMPLAINT: Diarrhea.   DISCHARGE DIAGNOSES: 1.  Clostridium difficile-associated diarrhea.  2.  Hypertension.  3.  Gastroesophageal reflux disease.  4.  Hyperlipidemia.  5.  Chronic urinary tract infections.  6.  History of Clostridium difficile in the past.  7.  History of diverticulitis in the past.   DISCHARGE MEDICATIONS:  Flagyl 500 mg p.o. every 8 hours for 10 days. Simvastatin 10 mg daily. Diovan 80 mg daily. Lexapro 10 mg daily. Enablex 15 mg extended-release 1 tab once a day in the morning. Please stop taking nitrofurantoin and pantoprazole for now.   DIET: Low sodium.   ACTIVITY: As tolerated.   FOLLOWUP: Please follow with your primary care physician within 1 to 2 weeks. Check a CBC within a week. If diarrhea recurs or worsens, or if there are any other symptoms, call your primary care physician right away.   DISPOSITION: Home.   HISTORY OF PRESENT ILLNESS AND BRIEF HOSPITAL COURSE: For full details of H and P, please see the dictation by Dr. Waldron Labs on August 25, 2012.  Briefly, this is a pleasant 74 year old female with the above chief complaint. The patient had been on Ceftin for a sinus issue. She started to have diarrhea. She presented to the hospital. There was no bright red blood per rectum, no melena or coffee ground emesis. There was some abdominal discomfort which did improve. She did get a dose of IV Cipro in the ED for possible UTI but that was likely contamination. She did have a mild leukocytosis. She was started on Flagyl as her C. diff. toxin came back positive. The patient also of note is on a proton pump inhibitor which has been associated with C. diff.  She was started on some IV fluid and her diarrhea dramatically improved. Currently, she is forming formed stool and  has no abdominal pain. She did have a slight drop in her hemoglobin. Her hemoglobin on arrival was 12.5 which did go down to 9..8 on December 20th but it trended back up to 9.9. There was no acute blood loss. This could be dilutional as the hemoglobin, hematocrit, WBC and platelets all went down. At this point, she is asymptomatic. She will be discharged with outpatient followup with her PCP.    TOTAL TIME SPENT: 33 minutes.     ____________________________ Vivien Presto, MD sa:cs D: 08/27/2012 13:45:00 ET T: 08/28/2012 19:52:19 ET JOB#: [id number]  cc: Einar Pheasant, MD Vivien Presto, MD, <Dictator>  Vivien Presto MD ELECTRONICALLY SIGNED 09/06/2012 14:12

## 2014-12-25 NOTE — Consult Note (Signed)
PATIENT NAME:  Leah Huber, Leah Huber MR#:  [id number] DATE OF BIRTH:  Feb 14, 1941  DATE OF CONSULTATION:  05/19/2012  REFERRING PHYSICIAN:  Dr. Rudene Christians  CONSULTING PHYSICIAN:  Epifanio Lesches, MD  PRIMARY DOCTOR: Primary doctor was Dr. Einar Pheasant but no one now.   ORTHOPEDIC DOCTOR: Dr. Rudene Christians   REASON FOR CONSULTATION: Hypotension.   HISTORY OF PRESENT ILLNESS: The patient is a 74 year old female patient with history of degenerative joint disease who had a left knee replacement done on September 10th. The patient has been doing well with physical therapy but she got hypotensive and she suddenly got unresponsive with pinpoint pupils this afternoon and at that time the patient also was hypotensive. She received two doses of Narcan and the [facility] team was called. The patient suddenly became unresponsive. She did come back, became more responsive but remained with altered mental status. I was asked to evaluate for hypotension and altered mental status for possible stroke. She got Nucynta at 9:26 a.m., 150 mg was given. The patient was noted to have pinpoint pupils with altered mental status around noontime. Nucynta has been stopped. The patient did have slurred speech for which she went for CAT scan. I went to see her. The patient is having active visual hallucinations, trying to grab for things in the air. Daughter mentioned that she is very sensitive to pain medications and did have some hallucination history before with pain medicines. Also, right now she is neurologically stable and able to answering questions appropriately but has staring looks. Most of the history is obtained from the daughter also.  PAST MEDICAL HISTORY: 1. History of hypertension. 2. Gastroesophageal reflux disease. 3. Hyperlipidemia. 4. Chronic urinary tract infections.   ALLERGIES: Bactrim and sulfa.   PAST SURGICAL HISTORY: Multiple surgeries including: 1. Tonsillectomy. 2. Right foot surgery. 3. Spinal cord  stimulator inserted. 4. Right knee arthroscopic surgery.  5. Breast reduction surgery. 6. Carpal tunnel release.  7. Rotator cuff surgery. 8. Cholecystectomy. 9. Laminectomy. 10. Hysterectomy.   SOCIAL HISTORY: No smoking, no drinking, no drugs. The patient lives with grandson now and is ambulatory.   REVIEW OF SYSTEMS: Right now unable to obtain because she is having active visual hallucinations and unable to give much complaints. Denies headache. Says that she is at [facility]. Not having any complaints as such.   PHYSICAL EXAMINATION:   VITAL SIGNS: Blood pressure 122/70 at 2 p.m., pulse 77, temperature 98, respirations 18, sats 98% on 4 liters.   GENERAL: She is awake and oriented to place and having visual hallucinations.   HEENT: Head atraumatic, normocephalic. Pupils equally reacting to light. Extraocular movements intact.   ENT: No tympanic membrane congestion. No turbinate hypertrophy. No oropharyngeal erythema.   NECK: Normal range of motion. No JVD. No carotid bruit.   CARDIOVASCULAR: S1, S2 regular. No murmurs.   LUNGS: Clear to auscultation. No wheeze. No rales.   ABDOMEN: Soft, nontender, nondistended. Bowel sounds present.   EXTREMITIES: Left knee is immobilized.   NEUROLOGIC: The patient is awake and following some commands. No focal neurological deficits. Cranial nerves II through XII intact. Power 5/5 upper extremity and lower extremities. Marland Kitchen   PSYCH: Having hallucinations.  LABORATORY, DIAGNOSTIC, AND RADIOLOGICAL DATA: Chest x-ray done for hypoxia shows cardiomegaly, pulmonary vascular prominence, mild atelectasis versus infiltrate in the lung bases.  CT of the head is done but the results are pending.   Electrolytes are stable. Yesterday's labs showed sodium 138, potassium 3.8, chloride 105, bicarb 24, BUN 13, creatinine 0.73,  glucose 99, hemoglobin 8.8, no white count is done.   ASSESSMENT AND PLAN:  1. The patient is a 74 year old female with altered mental  status, active visual hallucinations likely secondary to [place]. At this time blood pressure is stable but recommend holding Nucynta and also tramadol. Use ibuprofen if possible for the pain because she is having side effects with that and also has known history of side effects with pain medication.  2. Rule out possible CVA. She already had a CAT scan of the head. Follow the CT of the head.  3. Hypotension. She was hypotensive before but blood pressure improved nicely, probably due to pain medication, had transient hypotension and that resolved and the patient is maintaining blood pressure on her own without fluids. Continue valsartan and metoprolol with parameters. For systolic blood pressure less than 90, hold metoprolol and also valsartan.  4. Hypoxia with atelectasis on the x-ray. We can start her on empiric antibiotics with Levaquin and follow the CBC.  5. Continue GI prophylaxis and DVT prophylaxis.   TIME SPENT ON CONSULTATION: About 60 minutes.   Discussed the plan with the patient's daughter.   ____________________________ Epifanio Lesches, MD sk:drc D: 05/19/2012 15:09:28 ET T: 05/19/2012 16:05:25 ET JOB#: [id number]  cc: Epifanio Lesches, MD, <Dictator> Epifanio Lesches MD ELECTRONICALLY SIGNED 06/08/2012 22:34

## 2014-12-25 NOTE — Op Note (Signed)
PATIENT NAME:  Leah Huber, Leah Huber MR#:  [id number] DATE OF BIRTH:  [birth year]  DATE OF PROCEDURE:  05/17/2012  PREOPERATIVE DIAGNOSIS: Left knee osteoarthritis.   POSTOPERATIVE DIAGNOSIS: Left knee osteoarthritis.   PROCEDURE: Left total knee replacement.   ANESTHESIA: Spinal.   SURGEON: Laurene Footman, MD   ASSISTANT: Rachelle Hora, PA   DESCRIPTION OF PROCEDURE: The patient was brought to the operating room and after adequate spinal anesthesia was obtained, a tourniquet was applied to the upper thigh. After prepping and draping in the usual sterile fashion and with an Alvarado legholder in place, patient identification and time-out procedures were completed. The leg was exsanguinated with use of an Esmarch and the tourniquet raised to 300 mmHg. A midline skin incision was made followed by a medial parapatellar arthrotomy. Inspection revealed significant erosion of the medial compartment and patellofemoral joint, relative sparing of the lateral compartment. The ACL was excised along with the infrapatellar fat pad. The proximal tibia was exposed to allow for application of the Medacta cutting block. After this was applied and alignment checked, the proximal tibia cut was carried out with the menisci excised at this point. The femoral cutting guide was then applied in a similar fashion with drill holes made. Prior to cut the cuts appeared appropriate based on the preop template. The distal cut was carried out. Following this, the pins were placed in the distal femur and a #3 cutting block applied. Anterior, posterior, and chamfer cuts were carried out. Trial components were placed at this time with recession of the PCL as this was somewhat tight with a 2 tibial tray and 3 femur, 12 mm implant gave full extension but mid flexion instability with a 14 mm implant. Full extension was obtained and good stability in mid flexion and flexion. The distal drill holes were made in the femur at this point along with  a notch cut for the trochlear groove. Prior to the trialing, the keel cut was made into the proximal tibia with the rotation set based on the initial cutting block. The patella was cut using cutting guide and after drill holes were made and sized to a size 2 patella, patella tracked well. At this point, the posterior capsule was infiltrated with a combination of Toradol, morphine, and Sensorcaine along the medial capsule for postop analgesia. All trial components were removed and the bony surfaces thoroughly irrigated and dried. Tibial component was cemented into place first with excess cement removed followed by the tibial polyethylene component. The femoral component was impacted in place with excess cement removed followed by cement fixation of the patellar component. After the cement had set and excess cement had been removed, the knee was again thoroughly irrigated. The arthrotomy was repaired using Ethibond at the proximal distal ends of the pole of the patella and a heavy quill suture deep, 2-0 quill subcutaneously, and skin staples. Xeroform, 4 x 4's, ABD, Webril, Polar Care, Ace wrap, and knee immobilizer were applied. The patient was sent to the recovery room in stable condition.   ESTIMATED BLOOD LOSS: 50 mL.   COMPLICATIONS: None.   SPECIMEN: Cut ends of bone.   TOURNIQUET TIME: 87 minutes at 300 mmHg.  IMPLANTS: Medacta GMK primary total knee system, size left 3 narrow standard femur, size left 2 fixed tibial tray, size 2 14 mm UC polyethylene insert, and a size 2 resurfacing patella. All components were cemented.   ____________________________ Laurene Footman, MD mjm:drc D: 05/17/2012 19:57:11 ET T: 05/18/2012 73:53:29 ET JOB#:  [id number]  cc: Laurene Footman, MD, <Dictator> Christian [facility] MD ELECTRONICALLY SIGNED 05/18/2012 11:52

## 2014-12-25 NOTE — H&P (Signed)
PATIENT NAME:  Leah Huber, FRANEY MR#:  [id number] DATE OF BIRTH:  09/12/1940  DATE OF ADMISSION:  08/25/2012  REFERRING PHYSICIAN: Dr. Ponciano Ort.  PRIMARY CARE PHYSICIAN: Dr. Einar Pheasant.   CHIEF COMPLAINT: Diarrhea.   HISTORY OF PRESENT ILLNESS: This is a 74 year old female with significant past medical history of hypertension, gastroesophageal reflux disease, hyperlipidemia, chronic urinary tract infection on nitrofurantoin as well remote history for 2 years for C. diff colitis with diverticulitis. The patient presents with diarrhea that started yesterday night. The patient reports she has been having multiple episodes of watery, copious amounts, foul smelling that started yesterday. She had up to 8 episodes in ED as well complains of mild abdominal pain, with cramping before bowel movements. The patient had positive C. diff toxin done in ED. The patient reports she was on p.o. Ceftin for sinusitis for the last week. The patient denies any fever or chills. The patient does not have any leukocytosis. As well, she denies any bright red blood per rectum, melena or coffee-ground emesis. She complained of nausea and vomiting as well. As well, the patient had positive urinalysis done in the ED. She denies any dysuria or polyuria, but reports she has history of chronic UTI where she has been on a nitrofurantoin for the last few years where it controlled her urine infections, but urinalysis was contaminated where it had 9 epithelial cells. The patient received one dose of IV Cipro in the ED. Given the fact of the patient significant diarrhea, hospitalist service were requested to admit for hydration.   PAST MEDICAL HISTORY:  1.  Hypertension.  2.  Gastroesophageal reflux disease.  3.  Hyperlipidemia.  4.  Chronic urinary tract infections at a different time.  5. History of C. difficile colitis.  6. History of diverticulitis.   FAMILY HISTORY: Positive for hypertension.   HOME MEDICATIONS:  1.   Diovan 160 oral daily.  2.  Lexapro 10 mg oral daily.  3.  Enablex 15 mg oral daily.  4.  Protonix 40 mg p.o. b.i.d.  5.  Nitrofurantoin 50 mg p.o. at bedtime.  6.  Simvastatin 10 mg oral daily.   ALLERGIES: BACTRIM AND SULFA DRUGS.   PAST SURGICAL HISTORY:  1.  Tonsillectomy.  2.  Recent foot surgery.  3.  Spinal cord stimulator inserted and removed.  4.  Right knee arthroscopic surgery.  5.  Right knee replacement surgery.  6.  Tonsillectomy.  7.  Breast reduction surgery.  8.  Carpal tunnel release on the left.  9.  Rotator cuff surgery.  10. Cholecystectomy.  11. Appendectomy.  12. Hysterectomy.   SOCIAL HISTORY: Nonsmoker. No alcohol or illicit drug abuse.   REVIEW OF SYSTEMS:  CONSTITUTIONAL: Denies any fever or chills. Complains of fatigue and weakness.  EYES: Denies blurry vision, double vision or pain.  ENT: Denies tinnitus, ear pain, hearing loss or epistaxis. She had nasal discharge and secretions, resolved after was started on Ceftin.  RESPIRATORY: Denies cough, wheezing, hemoptysis and dyspnea.  CARDIOVASCULAR: Denies chest pain, orthopnea, edema, arrhythmia, palpitations and syncope.  GASTROINTESTINAL: Had complaints of nausea, vomiting, diarrhea, mild cramping and  abdominal pain. Denies hematemesis, melena, jaundice, rectal bleed, bright red blood per rectum.  GENITOURINARY: Denies dysuria, hematuria and renal colic.  ENDOCRINE: Denies polyuria, polydipsia, heat or cold intolerance.  HEMATOLOGY: Denies anemia, easy bruising and bleeding diathesis.  INTEGUMENT: Denies acne, rash or skin lesions.  MUSCULOSKELETAL: Denies any pain in the neck or shoulders. No cramps. No swelling or history  of gout.  NEUROLOGIC: Denies numbness, dysarthria, epilepsy, tremors, vertigo, cerebrovascular accident or transient ischemic attack.  PSYCHIATRIC: Denies anxiety, schizophrenia, nervousness, insomnia, substance or alcohol abuse.   PHYSICAL EXAMINATION:  VITAL SIGNS:  Temperature 97.4, pulse 98, respiratory rate 20, blood pressure 128/66, saturating 97% on room air.  GENERAL: Well-nourished female, who looks comfortable in bed in no present distress.  HEENT: Head atraumatic, normocephalic. Pupils equal, reactive to light. Pink conjunctivae. Anicteric sclerae. Moist oral mucosa.  NECK: Supple. No thyromegaly. No JVD.  CHEST: Good air entry bilaterally. No wheezing, rales or rhonchi.  CARDIOVASCULAR: S1, S2 heard. No rubs, murmurs or gallops.  ABDOMEN: Mild tenderness. No rebound. No guarding. Bowel sounds present. No organomegaly can be palpated.  EXTREMITIES: No edema. No clubbing. No cyanosis.  PSYCHIATRIC: Appropriate affect. Awake and alert x 3. Intact judgment and insight.  NEUROLOGIC: Cranial nerves grossly intact. Motor 5 out of 5 in all extremities.  SKIN: Normal skin turgor. Warm and dry.   PERTINENT LABORATORIES: Glucose 111, BUN 14, creatinine 0.97, sodium 136, potassium 3.7, chloride 103, CO2 26. Total protein 8.2, total bilirubin 0.3, alkaline phosphatase 92, ALT 20, AST 27. White blood cells 12.2, hemoglobin 12.5, hematocrit 37.2, platelets 333. C. diff toxin is positive. Urinalysis: There is trace leukocyte esterase, 27 white blood cells, +2 bacteria and 9 epithelial cells.   ASSESSMENT AND PLAN: This is a 74 year old female, who presents with diarrhea and positive Clostridium difficile toxin. The patient was on p.o. Ceftin for sinusitis. The patient had frequent episodes of diarrhea. Also, had positive urinalysis, but the specimen appears to be contaminated. She received intravenous Cipro in the Emergency Department.  1.  Clostridium difficile colitis: The patient will be started on p.o. Flagyl. As well, we will hold all other antibiotics at this point. We will hold nitrofurantoin for a few days and will hold  Protonix. She will be continued on gentle hydration. The patient is to finish total of 15 days of p.o. Flagyl. If no improvement of her  symptoms, p.o. vancomycin can be added.  2.  Urinary tract infection: The patient is known to have history of chronic urinary tract infection, on nitrofurantoin and just recently finished Ceftin for sinusitis. She had urinalysis specimen felt to be contaminated. She received 1 dose of intravenous Cipro in the Emergency Department. We will hold and continue with antibiotics, especially in the presence of Clostridium difficile diarrhea until urinary tract infection is confirmed by urine culture.  3.  Hypertension: Continue with Diovan.  4.  Hyperlipidemia:  Continue with simvastatin.  5.  Gastroesophageal reflux disease: We will hold proton pump inhibitor for a few days.  6.  Deep vein thrombosis prophylaxis: Subcutaneous heparin.  7.  CODE STATUS: Full code.   TOTAL TIME SPENT ON ADMISSION AND PATIENT CARE: 45 minutes.   ____________________________ Albertine Patricia, MD dse:aw D: 08/25/2012 01:27:58 ET T: 08/25/2012 07:46:13 ET JOB#: [id number]  cc: Albertine Patricia, MD, <Dictator> Hermelinda Diegel Graciela Husbands MD ELECTRONICALLY SIGNED 08/31/2012 12:21

## 2014-12-25 NOTE — Discharge Summary (Signed)
PATIENT NAME:  Leah, Huber MR#:  [id number] DATE OF BIRTH:  1941/06/28  DATE OF ADMISSION:  05/17/2012 DATE OF DISCHARGE:  05/23/2012  ADMITTING DIAGNOSIS: Left knee osteoarthritis.   DISCHARGE DIAGNOSIS: Left knee osteoarthritis.   PROCEDURE: Left total knee replacement.   ANESTHESIA: Spinal.   SURGEON: Laurene Footman, M.D.   ASSISTANT: Rachelle Hora, PA-C   ESTIMATED BLOOD LOSS: 50 mL.   COMPLICATIONS: None.   SPECIMEN: Cut ends of bone.  TOURNIQUET TIME: 87 minutes at 300 mmHg.  IMPLANTS: Medacta GMK primary total knee system, size left three narrow standard femur, size left two fixed tibial tray, size 214 mm UC polyethylene insert and a size two resurfacing patella. All components were cemented.  HISTORY: Patient is a 74 year old female undergoing left total knee arthroplasty on 05/17/2012 with Dr. Rudene Christians. Patient had successful right knee replacement on 10/08/2011, has been doing very well. The patient's left knee has been symptomatic and causing her significant amount of pain ever since before her right knee surgery was performed. Over the last year patient has left knee pain that has progressively been getting worse. She has pain with walking as well as standing. Pain has been limiting her activities of daily living.   PHYSICAL EXAMINATION: GENERAL: Well developed, well nourished female no apparent distress. Normal affect. Gait with no antalgic component. HEENT: Head normocephalic, atraumatic. Pupils equal, round, reactive to light. She wears glasses. No dentures or partials. HEART: Reveals regular rate and rhythm. There is no murmur. There is normal apical pulse. LUNGS: Clear to auscultation. There is no wheeze or crackles. There is normal expansion of bilateral chest walls. LEFT LOWER EXTREMITY: Examination of left knee shows no effusion, no erythema, no warmth. No bony abnormalities noted. There is a slight varus abnormality. The patient is mildly tender along the medial joint  line and lateral joint line. There is no noted posterior joint effusion. Patient has extension of 10 degrees and flexion to 115 degrees. There is no tenderness of flexion and extension. Patient has a negative McMurray's test. There is no retropatellar discomfort. She has a negative patella stress test. There is no instability of the knee. The patient has a negative valgus stress and negative varus stress test. Patient has a negative Lachman test. NEUROLOGIC: There is good quad control. There is no known atrophy. The patient has negative straight leg raise. Patient has normal sensation to light touch. VASCULAR: The patient has less than 2 seconds capillary refill. The dorsalis pedis and posterior tibial pulses are intact.   HOSPITAL COURSE: Patient was admitted to the hospital on 05/17/2012. She had surgery that same day and was brought to the orthopedic floor from the PAC-U unit in stable condition. On postoperative day two patient had blood pressure drop of 60s/40s and was not answering questions appropriately. She was given Narcan twice which did improve her symptoms. Vital signs stablized and CT of the head was negative for any signs of stroke. Patient remained hallucinating the next couple of days. Chest x-ray and urinalysis did turn out to show atelectasis with no signs of urinary tract infection. Patient discontinued off of narcotic medications and only used Tylenol and ibuprofen. On postoperative day five she was much improved and back to her baseline. Patient progressed well with physical therapy. She was ready for discharge on 05/23/2012 to rehab facility.   DISCHARGE INSTRUCTIONS: Patient is to follow up with [facility] two weeks postoperative for staple removal. She is to be evaluated and treated for difficulty  walking, weight-bearing as tolerated. Occupational therapy needs to evaluate and treat for muscle weakness and activities of daily living. She should resume a regular diet. She  is to wear TED hose thigh high bilaterally. Polar Care should be maintained temperature between 40 and 50 degrees, wear at all times. Activity is weight-bearing as tolerated.   DISCHARGE MEDICATIONS:  1. Tylenol 500 to 100 mg oral q.4 hours p.r.n. pain or temperature greater than 100.4.  2. Zolpidem 5 mg oral tablet at bedtime p.r.n. insomnia. 3. Dulcolax 10 mg rectal daily p.r.n. constipation. 4. Milk of magnesia 30 mL oral b.i.d. p.r.n. constipation.  5. Aluminum mag hydroxide with simethicone 400/400/40 mg/5. 6. Senokot 1 tablet oral b.i.d.  7. Simvastatin 10 mg oral at bedtime.  8. Pantoprazole tablet 40 mg oral b.i.d PPI. 9. Metoprolol 25 mg oral b.i.d. 10. Lexapro 10 mg oral daily. 11. Valsartan tablet. 12. Diovan 80 mg oral q.a.m.  13. Vitamin B complex with iron intrinsic factor 1 capsule oral twice a day. 14. Celebrex 200 mg oral b.i.d.  15. Vicodin 5/325, 1 tablet oral q.4 hours p.r.n. pain. 16. Enema soapsuds p.r.n. frequency daily.  ____________________________ Duanne Guess, PA-C tcg:cms D: 05/23/2012 08:01:34 ET T: 05/23/2012 09:28:16 ET JOB#: [id number]  cc: Duanne Guess, PA-C, <Dictator> Duanne Guess PA ELECTRONICALLY SIGNED 05/23/2012 11:22

## 2014-12-30 NOTE — Discharge Summary (Signed)
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   DISCHARGE DIAGNOSES: 1. 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  2. Dizziness.   3. Nausea.   ATTENDING: Hessie Knows, MD, [place]    PROCEDURES: On 10/08/2011, the patient underwent right total knee replacement by Dr. Rudene Christians with assistant Dorthula Matas, PA-C.   ANESTHESIA: Spinal.   ESTIMATED BLOOD LOSS: 50 mL.  TOURNIQUET TIME: 61 minutes.  SPECIMENS SENT: Cut ends of bone which did reveal osteoarthrosis and no malignancy.   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   IMPLANTS: Implants were by [employer] GMK 3 femur, 2 tibia.   DRAINS: No drains were placed.   COMPLICATIONS: No complications occurred.   PATIENT HISTORY: The patient is a 74 year old who has had extensive treatment for her right knee. She has extensive osteoarthritis confirmed on x-rays with complete loss of medial joint space as well as significant patellofemoral degenerative change. She has had corticosteroid injections and Synvisc injections without relief. She has been through an exercise program again without relief. She has been taking anti-inflammatory medication as well. She has limitation of activities of daily living including getting around the grocery store and taking care of herself. She comes in to discuss knee replacement surgery.   ALLERGIES/ADVERSE REACTIONS: Sulfa, clindamycin, and Bactrim.   PAST MEDICAL HISTORY:  1. Hypertension.  2. High cholesterol.  3. Hemorrhoids.  4. Gout.  5. Depression.  6. Chickenpox.  7. Arthritis.  8. Shingles.   PHYSICAL EXAMINATION: HEART: Regular rate and rhythm. LUNGS: Clear to auscultation. RIGHT KNEE: 10 to 120 degrees range of  motion with varus deformity that is passively correctable. She has crepitation with range of motion of the knee at the medial and patellofemoral joints. She walks with an antalgic gait. Of note, she also has some left knee pain today but has less extensive degenerative change on this side. Concerning her right leg, though, she has strong distal pulses and sensation intact.   HOSPITAL COURSE: The patient underwent the aforementioned procedure on January 57QI without complication and was transferred to the PAC-U and then the orthopedic floor in stable condition. First day postop hemoglobin was 9.6. She was treated with TED hose as well as Xarelto tablets for DVT prophylaxis while here. She had PCA for pain control. Of note, she had a bit of residual foot weakness due to some back problems. On postop day two, her Foley catheter had been removed. Hemoglobin was 9. She had reported a bit of dizziness. She would not complain about this later on. Right knee dressing was changed and her incision site had been found to be intact with staples and no sign of infection. Postop day three she was found to be doing well. She was found up in the chair waiting to have a bath. She had passed stool several times while here. She had worked with physical therapy on multiple occasions and her active assistive range of motion of the knee was 0 to 84 degrees on February 4th but she did get her knee to 95 degrees on the 3rd apparently. She had ambulated 160 feet on the 3rd.   CONDITION AT DISCHARGE: Stable.   DISPOSITION: Rehab facility.   DISCHARGE MEDICATIONS:  1. Dulcolax 10 mg rectal daily as needed for constipation.  2. Milk of  Magnesia 30 mL oral twice a day as needed for constipation.  3. Antacid DS suspension 30 mL oral every six hours as needed for heartburn.  4. Senokot-S 1 tablet oral twice a day. 5. Lexapro 10 mg oral daily. 6. Nitrofurantoin 50 mg oral at bedtime.  7. Protonix 40 mg oral twice a day. 8. Zocor 10  mg oral at bedtime. 9. Valsartan 80 mg oral every morning.  10. Lopressor 25 mg oral twice a day.  11. Tylenol 1000 mg every six hours as needed for pain or temperature greater than 100.4.  12. Nucynta 75 to 150 mg oral every six hours as needed for pain. 13. Xarelto 10 mg take 1 pill every day. Last dose is to be on February 12th. Please discontinue after this.   DISCHARGE INSTRUCTIONS AND FOLLOW-UP: 1. Physical therapy and occupational therapy consults.  2. She is weightbearing as tolerated on the surgical leg. Please work on range of motion as well as strength and ambulation/mobility.  3. Regular diet.  4. Apply exit alarm.  5. TED hose, thigh-high, are to be worn daily but these may be discontinued four weeks after surgical date.  6. Encourage incentive spirometry.  7. Polar Care is to be applied to right knee to decrease swelling and pain while the patient is sitting or lying down.  8. Soapsuds enema as needed.  9. Universal skin precautions.  10.  Please call [facility] at [phone] to confirm two week postsurgical follow-up appointment.  ____________________________ Jerrel Ivory. Charlett Nose, [place] jrp:drc D: 10/12/2011 08:18:14 ET T: 10/12/2011 08:40:07 ET JOB#: [id number]  cc: Jerrel Ivory. Charlett Nose, [place], <Dictator> [place] PA ELECTRONICALLY SIGNED 10/14/2011 8:06

## 2014-12-30 NOTE — Op Note (Signed)
PATIENT NAME:  Leah Huber, Leah Huber MR#:  [id number] DATE OF BIRTH:  06-Sep-1941  DATE OF PROCEDURE:  10/08/2011  PREOPERATIVE DIAGNOSIS: Right knee osteoarthritis.  POSTOPERATIVE DIAGNOSIS: Right knee osteoarthritis.  PROCEDURE: Right total knee replacement.   SURGEON: Laurene Footman, MD  ASSISTANT: Dorthula Matas, PA-C  ANESTHESIA: Spinal.  DESCRIPTION OF PROCEDURE: The patient was brought to the Operating Room and, after adequate anesthesia was obtained, the leg was prepped and draped in the usual sterile fashion with the [place]ado legholder utilized, after appropriate patient identification and time out procedures were carried out. The leg was exsanguinated with the use of an Ace wrap and the tourniquet raised to 300 mmHg. A midline skin incision was made followed by a medial parapatellar arthrotomy. Inspection of the knee revealed exposed bone in the medial compartment and patellofemoral joint, but no real normal cartilage. There was evidence of chondrocalcinosis throughout. The anterior cruciate ligament was excised along with the retropatellar fat pad. After cleaning the anterior horns of the menisci, the anterior tibial cutting block was applied and drill hole placed with alignment checked. Proximal tibia cut was carried out with bone removed. Next, the femoral cutting guide was applied from the [place] My-Knee system and the distal cut made along with the two distal pin holes for cutting block. A #3 cutting block was applied and anterior and posterior chamfer cuts made without notching. After this was completed, trial components were placed, first the tibia component, after excision of the posterior horns of the menisci. The #2 tibia fit well and with a 10 mm implant and the #3 femoral component full extension was obtained with good stability. At this point, the notch was made for the trochlear portion of the implant and distal femoral drill holes made. All of these trial components were  removed. The patella was then cut with the cutting guide resecting 10 mm and sized to a #2. Three drill holes were made in the patella. At this point, a mix of 30 mL of 0.25% Sensorcaine with epinephrine, 30 mg Toradol, and 10 mg morphine with saline to 60 mL was infiltrated in the posterior capsule and medial arthrotomy. The bony surfaces were thoroughly irrigated and dried and the tibial component was cemented into place with a 10 mm tibial insert placed next. The femoral component was then placed with cement and the knee held in extension as the patellar button was clamped into place with excess cement being removed. After the cement had hardened, the knee was examined and was stable through a range of motion with normal patellofemoral tracking. The tourniquet was let down and there was no excessive bleeding. The knee was again thoroughly irrigated and closed using a heavy quill suture for the capsule, 2-0 quill subcutaneously, and skin staples. Xeroform, 4 x 4's, Webril, Polar Care, and Ace wrap were applied, and the patient was sent to the recovery room in stable condition.   IMPLANTS: Medacta GMK primary total knee system with a #2 right fixed tibial tray, a #3 right femoral component, and #2 10 mm thick tibial GMK primary tibial insert, standard, and a #2 resurfacing patella. All components were cemented with Simplex P bone cement from [employer].   ESTIMATED BLOOD LOSS: 50 mL.   COMPLICATIONS: None.   SPECIMEN: Cut ends of bone.   TOURNIQUET TIME: 61 minutes at 300 mmHg. ____________________________ Laurene Footman, MD mjm:slb D: 10/08/2011 22:02:32 ET T: 10/09/2011 11:41:16 ET JOB#: [id number]  cc: Laurene Footman, MD, <Dictator> Laurene Footman MD  ELECTRONICALLY SIGNED 10/09/2011 20:06

## 2015-01-11 ENCOUNTER — Ambulatory Visit: Payer: Medicare Other | Admitting: Internal Medicine

## 2015-01-16 ENCOUNTER — Encounter: Payer: Self-pay | Admitting: Internal Medicine

## 2015-01-17 ENCOUNTER — Encounter: Payer: Self-pay | Admitting: Internal Medicine

## 2015-01-17 ENCOUNTER — Other Ambulatory Visit: Payer: Self-pay | Admitting: *Deleted

## 2015-01-17 MED ORDER — VALSARTAN 80 MG PO TABS: 80.0000 mg | ORAL_TABLET | Freq: Every day | ORAL | Status: DC

## 2015-01-17 NOTE — Telephone Encounter (Signed)
Pt is out of Diovan & has not received her refill from her mail order pharmacy.

## 2015-01-17 NOTE — Telephone Encounter (Signed)
Per Dr. Nicki Reaper: Please call the patient and confirm doing ok otherwise. I would like for her to hold the diovan and monitor her blood pressure. Will allow pressure to come up. If begins to increase too much, then can restart a different medication. Let me know if any problems.   Called pt, no answer, left VM to all back. Also advised I would send mychart msg.

## 2015-01-22 ENCOUNTER — Ambulatory Visit (INDEPENDENT_AMBULATORY_CARE_PROVIDER_SITE_OTHER): Payer: Medicare Other | Admitting: Internal Medicine

## 2015-01-22 ENCOUNTER — Encounter: Payer: Self-pay | Admitting: Internal Medicine

## 2015-01-22 VITALS — BP 142/78 | HR 74 | Temp 98.0°F | Ht 64.0 in | Wt 164.0 lb

## 2015-01-22 DIAGNOSIS — M4722 Other spondylosis with radiculopathy, cervical region: Secondary | ICD-10-CM | POA: Diagnosis not present

## 2015-01-22 DIAGNOSIS — Z0000 Encounter for general adult medical examination without abnormal findings: Secondary | ICD-10-CM

## 2015-01-22 DIAGNOSIS — I10 Essential (primary) hypertension: Secondary | ICD-10-CM

## 2015-01-22 NOTE — Progress Notes (Signed)
Pre visit review using our clinic review tool, if applicable. No additional management support is needed unless otherwise documented below in the visit note. 

## 2015-01-23 ENCOUNTER — Encounter: Payer: Self-pay | Admitting: Internal Medicine

## 2015-01-23 NOTE — Progress Notes (Signed)
Patient ID: Leah Huber, female   DOB: 05-04-1941, 74 y.o.   MRN: [id number]   Subjective:    Patient ID: Leah Huber, female    DOB: February 18, 1941, 74 y.o.   MRN: [id number]  HPI  Patient here as a work in to discuss her blood pressure.  She had neck surgery recently.  Doing well.  Pain in shoulders better.  Has adjusted her diet.  Losing weight.  Since dong this, her blood pressure has been low.  See her my chart messages for details.  She was instructed to stop her blood pressure medication and follow pressures.  She has been off her medication for five days.  Blood pressure has been doing well since stopping ( on her outside checks).  No chest pain or tightness.  No sob.  No headache or dizziness.  States eating regular meals.  Staying hydrated.     Past Medical History  Diagnosis Date  . Arthritis   . Depression   . Diverticulosis   . Hypertension   . Hypercholesterolemia   . Colon polyp   . Urinary incontinence   . Hypovolemia 12/10    acute renal failure  . Sleep apnea     very mild   sleep study 8 yrs ago does not use CPAP  . GERD (gastroesophageal reflux disease)   . Anemia   . Neuromuscular disorder     occasionally tingling in hands  . Glaucoma     Current Outpatient Prescriptions on File Prior to Visit  Medication Sig Dispense Refill  . Bimatoprost (LUMIGAN OP) Apply 1 drop to eye daily. Each eye    . DETROL LA 4 MG 24 hr capsule TAKE 1 CAPSULE DAILY 90 capsule 0  . pantoprazole (PROTONIX) 40 MG tablet TAKE 1 TABLET TWICE A DAY 180 tablet 1  . pravastatin (PRAVACHOL) 40 MG tablet Take 1 tablet (40 mg total) by mouth daily. 90 tablet 1  . valsartan (DIOVAN) 80 MG tablet Take 1 tablet (80 mg total) by mouth daily. (Patient not taking: Reported on 01/22/2015) 30 tablet 0   No current facility-administered medications on file prior to visit.    Review of Systems  Constitutional: Negative for appetite change and unexpected weight change (has adjusted her diet and  lost weight.  ).  HENT: Negative for congestion and sinus pressure.   Respiratory: Negative for cough, chest tightness and shortness of breath.   Cardiovascular: Negative for chest pain, palpitations and leg swelling.  Gastrointestinal: Negative for nausea, vomiting, abdominal pain and diarrhea.  Musculoskeletal:       Neck and shoulder pain better s/p sugery.    Neurological: Negative for dizziness, light-headedness and headaches.  Psychiatric/Behavioral: Negative for dysphoric mood and agitation.       Has stopped alcohol.        Objective:     Blood pressure recheck:  142/78  Physical Exam  Constitutional: She appears well-developed and well-nourished. No distress.  HENT:  Nose: Nose normal.  Mouth/Throat: Oropharynx is clear and moist.  Neck: Neck supple.  Cardiovascular: Normal rate and regular rhythm.   Pulmonary/Chest: Breath sounds normal. No respiratory distress. She has no wheezes.  Abdominal: Soft. Bowel sounds are normal. There is no tenderness.  Musculoskeletal: She exhibits no edema or tenderness.  Lymphadenopathy:    She has no cervical adenopathy.  Psychiatric: She has a normal mood and affect. Her behavior is normal.    BP 142/78 mmHg  Pulse 74  Temp(Src) 98 F (36.7 C) (  Oral)  Ht 5\' 4"  (1.626 m)  Wt 164 lb (74.39 kg)  BMI 28.14 kg/m2  SpO2 97%  LMP 08/15/1978 Wt Readings from Last 3 Encounters:  01/22/15 164 lb (74.39 kg)  11/02/14 166 lb 8 oz (75.524 kg)  07/30/14 162 lb 12 oz (73.823 kg)     Lab Results  Component Value Date   WBC 5.3 11/19/2014   HGB 12.3 11/19/2014   HCT 37.4 11/19/2014   PLT 288 11/19/2014   GLUCOSE 98 11/19/2014   CHOL 251* 08/21/2014   TRIG 160.0* 08/21/2014   HDL 82.70 08/21/2014   LDLDIRECT 147.1 05/30/2013   LDLCALC 136* 08/21/2014   ALT 19 08/20/2014   AST 24 08/20/2014   NA 139 11/19/2014   K 4.0 11/19/2014   CL 105 11/19/2014   CREATININE 0.77 11/19/2014   BUN 16 11/19/2014   CO2 25 11/19/2014   TSH  2.60 08/20/2014   INR 1.0 10/16/2013       Assessment & Plan:   Problem List Items Addressed This Visit    Cervical spondylosis with radiculopathy    S/p surgery and doing well.  Decreased pain.  Follow. Continue f/u with Dr Arnoldo Morale.       Health care maintenance    Physical 11/02/14.  Mammogram 11/14/14 [facility] I.  Recommended f/u mammogram in one year.  Colonoscopy 01/19/14.  Recommended f/u colonoscopy in 01/2019.       Hypertension - Primary    Off blood pressure medication.  Her pressures have been ok since stopping.  Elevated on initial check here in the office.  Recheck improved.  Hold on restarting blood pressure medication.  Follow pressures.  Get her back in soon to reassess.  She will bring her cuff.           Einar Pheasant, MD

## 2015-01-23 NOTE — Assessment & Plan Note (Signed)
S/p surgery and doing well.  Decreased pain.  Follow. Continue f/u with Dr Arnoldo Morale.

## 2015-01-23 NOTE — Assessment & Plan Note (Signed)
Physical 11/02/14.  Mammogram 11/14/14 Birads I.  Recommended f/u mammogram in one year.  Colonoscopy 01/19/14.  Recommended f/u colonoscopy in 01/2019.

## 2015-01-23 NOTE — Assessment & Plan Note (Signed)
Off blood pressure medication.  Her pressures have been ok since stopping.  Elevated on initial check here in the office.  Recheck improved.  Hold on restarting blood pressure medication.  Follow pressures.  Get her back in soon to reassess.  She will bring her cuff.

## 2015-02-02 ENCOUNTER — Other Ambulatory Visit: Payer: Self-pay | Admitting: Internal Medicine

## 2015-02-06 ENCOUNTER — Ambulatory Visit (INDEPENDENT_AMBULATORY_CARE_PROVIDER_SITE_OTHER): Payer: Medicare Other | Admitting: Internal Medicine

## 2015-02-06 ENCOUNTER — Encounter: Payer: Self-pay | Admitting: Internal Medicine

## 2015-02-06 VITALS — BP 135/85 | HR 75 | Temp 98.4°F | Ht 64.0 in | Wt 158.5 lb

## 2015-02-06 DIAGNOSIS — E7800 Pure hypercholesterolemia, unspecified: Secondary | ICD-10-CM

## 2015-02-06 DIAGNOSIS — M542 Cervicalgia: Secondary | ICD-10-CM | POA: Diagnosis not present

## 2015-02-06 DIAGNOSIS — F1010 Alcohol abuse, uncomplicated: Secondary | ICD-10-CM

## 2015-02-06 DIAGNOSIS — I10 Essential (primary) hypertension: Secondary | ICD-10-CM | POA: Diagnosis not present

## 2015-02-06 NOTE — Progress Notes (Signed)
Patient ID: Leah Huber, female   DOB: 1940/11/19, 74 y.o.   MRN: [id number]   Subjective:    Patient ID: Leah Huber, female    DOB: 07/21/1941, 74 y.o.   MRN: [id number]  HPI  Patient here for a scheduled follow up.  On no blood pressure medication.  Blood pressure doing well.  Has adjusted her diet.  Lost weight.  Stays active.  No cardiac symptoms with increased activity or exertion.  Breathing stable.  Eating regular meals.  Bowels stable.  Neck doing well s/p surgery.  No alcohol.     Past Medical History  Diagnosis Date  . Arthritis   . Depression   . Diverticulosis   . Hypertension   . Hypercholesterolemia   . Colon polyp   . Urinary incontinence   . Hypovolemia 12/10    acute renal failure  . Sleep apnea     very mild   sleep study 8 yrs ago does not use CPAP  . GERD (gastroesophageal reflux disease)   . Anemia   . Neuromuscular disorder     occasionally tingling in hands  . Glaucoma     Current Outpatient Prescriptions on File Prior to Visit  Medication Sig Dispense Refill  . Bimatoprost (LUMIGAN OP) Apply 1 drop to eye daily. Each eye    . DETROL LA 4 MG 24 hr capsule TAKE 1 CAPSULE DAILY 90 capsule 0  . pantoprazole (PROTONIX) 40 MG tablet TAKE 1 TABLET TWICE A DAY 180 tablet 1  . pravastatin (PRAVACHOL) 40 MG tablet TAKE 1 TABLET DAILY 90 tablet 1   No current facility-administered medications on file prior to visit.    Review of Systems  Constitutional: Negative for appetite change and unexpected weight change.  HENT: Negative for congestion and sinus pressure.   Respiratory: Negative for cough, chest tightness and shortness of breath.   Cardiovascular: Negative for chest pain, palpitations and leg swelling.  Gastrointestinal: Negative for nausea, vomiting, abdominal pain and diarrhea.  Musculoskeletal:       Neck is doing well s/p surgery.    Skin: Negative for color change and rash.  Neurological: Negative for dizziness, light-headedness and  headaches.  Psychiatric/Behavioral: Negative for dysphoric mood and agitation.       Objective:     Blood pressure recheck:  130-132/78  Physical Exam  Constitutional: She appears well-developed and well-nourished. No distress.  HENT:  Nose: Nose normal.  Mouth/Throat: Oropharynx is clear and moist.  Neck: Neck supple. No thyromegaly present.  Cardiovascular: Normal rate and regular rhythm.   Pulmonary/Chest: Breath sounds normal. No respiratory distress. She has no wheezes.  Abdominal: Soft. Bowel sounds are normal. There is no tenderness.  Musculoskeletal: She exhibits no edema or tenderness.  Lymphadenopathy:    She has no cervical adenopathy.  Skin: No rash noted. No erythema.  Psychiatric: She has a normal mood and affect. Her behavior is normal.    BP 135/85 mmHg  Pulse 75  Temp(Src) 98.4 F (36.9 C) (Oral)  Ht 5\' 4"  (1.626 m)  Wt 158 lb 8 oz (71.895 kg)  BMI 27.19 kg/m2  SpO2 97%  LMP 08/15/1978 Wt Readings from Last 3 Encounters:  02/06/15 158 lb 8 oz (71.895 kg)  01/22/15 164 lb (74.39 kg)  11/02/14 166 lb 8 oz (75.524 kg)     Lab Results  Component Value Date   WBC 5.3 11/19/2014   HGB 12.3 11/19/2014   HCT 37.4 11/19/2014   PLT 288 11/19/2014  GLUCOSE 98 11/19/2014   CHOL 251* 08/21/2014   TRIG 160.0* 08/21/2014   HDL 82.70 08/21/2014   LDLDIRECT 147.1 05/30/2013   LDLCALC 136* 08/21/2014   ALT 19 08/20/2014   AST 24 08/20/2014   NA 139 11/19/2014   K 4.0 11/19/2014   CL 105 11/19/2014   CREATININE 0.77 11/19/2014   BUN 16 11/19/2014   CO2 25 11/19/2014   TSH 2.60 08/20/2014   INR 1.0 10/16/2013       Assessment & Plan:   Problem List Items Addressed This Visit    Alcohol abuse    No alcohol intake now.  Follow.       Hypercholesterolemia   Relevant Orders   Hepatic function panel   Lipid panel   Hypertension - Primary    Blood pressure doing well on no medication.  Follow pressures.  Remain off medication.        Relevant  Orders   Basic metabolic panel   Neck pain    Doing well s/p surgery.           Einar Pheasant, MD

## 2015-02-06 NOTE — Progress Notes (Signed)
Pre visit review using our clinic review tool, if applicable. No additional management support is needed unless otherwise documented below in the visit note. 

## 2015-02-10 ENCOUNTER — Encounter: Payer: Self-pay | Admitting: Internal Medicine

## 2015-02-10 NOTE — Assessment & Plan Note (Signed)
Doing well s/p surgery.

## 2015-02-10 NOTE — Assessment & Plan Note (Signed)
No alcohol intake now.  Follow.

## 2015-02-10 NOTE — Assessment & Plan Note (Signed)
Blood pressure doing well on no medication.  Follow pressures.  Remain off medication.

## 2015-02-18 ENCOUNTER — Other Ambulatory Visit: Payer: Self-pay | Admitting: Internal Medicine

## 2015-02-23 ENCOUNTER — Encounter: Payer: Self-pay | Admitting: Internal Medicine

## 2015-02-27 ENCOUNTER — Other Ambulatory Visit (INDEPENDENT_AMBULATORY_CARE_PROVIDER_SITE_OTHER): Payer: Medicare Other

## 2015-02-27 DIAGNOSIS — I10 Essential (primary) hypertension: Secondary | ICD-10-CM | POA: Diagnosis not present

## 2015-02-27 DIAGNOSIS — E7800 Pure hypercholesterolemia, unspecified: Secondary | ICD-10-CM

## 2015-02-27 DIAGNOSIS — D649 Anemia, unspecified: Secondary | ICD-10-CM

## 2015-02-27 LAB — HEPATIC FUNCTION PANEL
ALBUMIN: 4.6 g/dL (ref 3.5–5.2)
ALT: 19 U/L (ref 0–35)
AST: 34 U/L (ref 0–37)
Alkaline Phosphatase: 53 U/L (ref 39–117)
Bilirubin, Direct: 0.1 mg/dL (ref 0.0–0.3)
TOTAL PROTEIN: 7 g/dL (ref 6.0–8.3)
Total Bilirubin: 0.6 mg/dL (ref 0.2–1.2)

## 2015-02-27 LAB — BASIC METABOLIC PANEL
BUN: 19 mg/dL (ref 6–23)
CO2: 28 mEq/L (ref 19–32)
Calcium: 9.9 mg/dL (ref 8.4–10.5)
Chloride: 103 mEq/L (ref 96–112)
Creatinine, Ser: 0.87 mg/dL (ref 0.40–1.20)
GFR: 67.42 mL/min (ref >60.00)
Glucose, Bld: 93 mg/dL (ref 70–99)
POTASSIUM: 5.2 meq/L — AB (ref 3.5–5.1)
SODIUM: 139 meq/L (ref 135–145)

## 2015-02-27 LAB — CBC WITH DIFFERENTIAL/PLATELET
BASOS ABS: 0.1 10*3/uL (ref 0.0–0.1)
Basophils Relative: 1.4 % (ref 0.0–3.0)
Eosinophils Absolute: 0.4 10*3/uL (ref 0.0–0.7)
Eosinophils Relative: 6.6 % — ABNORMAL HIGH (ref 0.0–5.0)
HCT: 39.4 % (ref 36.0–46.0)
HEMOGLOBIN: 13 g/dL (ref 12.0–15.0)
LYMPHS ABS: 2.5 10*3/uL (ref 0.7–4.0)
LYMPHS PCT: 40.3 % (ref 12.0–46.0)
MCHC: 32.9 g/dL (ref 30.0–36.0)
MCV: 95.7 fl (ref 78.0–100.0)
MONO ABS: 0.4 10*3/uL (ref 0.1–1.0)
Monocytes Relative: 6.7 % (ref 3.0–12.0)
NEUTROS ABS: 2.8 10*3/uL (ref 1.4–7.7)
Neutrophils Relative %: 45 % (ref 43.0–77.0)
Platelets: 294 10*3/uL (ref 150.0–400.0)
RBC: 4.12 Mil/uL (ref 3.87–5.11)
RDW: 13.3 % (ref 11.5–15.5)
WBC: 6.2 10*3/uL (ref 4.0–10.5)

## 2015-02-27 LAB — LIPID PANEL
CHOLESTEROL: 223 mg/dL — AB (ref 0–200)
HDL: 80.3 mg/dL (ref >39.00)
LDL Cholesterol: 124 mg/dL — ABNORMAL HIGH (ref 0–99)
NonHDL: 142.7
Total CHOL/HDL Ratio: 3
Triglycerides: 93 mg/dL (ref 0.0–149.0)
VLDL: 18.6 mg/dL (ref 0.0–40.0)

## 2015-03-04 ENCOUNTER — Other Ambulatory Visit: Payer: Self-pay | Admitting: Internal Medicine

## 2015-03-04 DIAGNOSIS — E875 Hyperkalemia: Secondary | ICD-10-CM

## 2015-03-04 NOTE — Progress Notes (Signed)
Order placed for f/u potassium.  

## 2015-03-07 ENCOUNTER — Encounter: Payer: Self-pay | Admitting: Internal Medicine

## 2015-03-07 ENCOUNTER — Other Ambulatory Visit (INDEPENDENT_AMBULATORY_CARE_PROVIDER_SITE_OTHER): Payer: Medicare Other

## 2015-03-07 DIAGNOSIS — E875 Hyperkalemia: Secondary | ICD-10-CM | POA: Diagnosis not present

## 2015-03-07 DIAGNOSIS — I10 Essential (primary) hypertension: Secondary | ICD-10-CM | POA: Diagnosis not present

## 2015-03-07 DIAGNOSIS — E7800 Pure hypercholesterolemia, unspecified: Secondary | ICD-10-CM

## 2015-03-07 LAB — BASIC METABOLIC PANEL
BUN: 20 mg/dL (ref 6–23)
CALCIUM: 10 mg/dL (ref 8.4–10.5)
CO2: 30 mEq/L (ref 19–32)
Chloride: 102 mEq/L (ref 96–112)
Creatinine, Ser: 0.85 mg/dL (ref 0.40–1.20)
GFR: 69.25 mL/min (ref >60.00)
GLUCOSE: 89 mg/dL (ref 70–99)
POTASSIUM: 4.8 meq/L (ref 3.5–5.1)
SODIUM: 140 meq/L (ref 135–145)

## 2015-03-07 LAB — HEPATIC FUNCTION PANEL
ALT: 19 U/L (ref 0–35)
AST: 31 U/L (ref 0–37)
Albumin: 4.5 g/dL (ref 3.5–5.2)
Alkaline Phosphatase: 52 U/L (ref 39–117)
Bilirubin, Direct: 0.1 mg/dL (ref 0.0–0.3)
Total Bilirubin: 0.6 mg/dL (ref 0.2–1.2)
Total Protein: 7.1 g/dL (ref 6.0–8.3)

## 2015-03-07 LAB — LIPID PANEL
Cholesterol: 218 mg/dL — ABNORMAL HIGH (ref 0–200)
HDL: 72.2 mg/dL (ref >39.00)
LDL Cholesterol: 115 mg/dL — ABNORMAL HIGH (ref 0–99)
NONHDL: 145.8
Total CHOL/HDL Ratio: 3
Triglycerides: 152 mg/dL — ABNORMAL HIGH (ref 0.0–149.0)
VLDL: 30.4 mg/dL (ref 0.0–40.0)

## 2015-03-07 LAB — POTASSIUM: Potassium: 4.8 mEq/L (ref 3.5–5.1)

## 2015-03-12 NOTE — Telephone Encounter (Signed)
Unread mychart message mailed to patient 

## 2015-04-09 DIAGNOSIS — R030 Elevated blood-pressure reading, without diagnosis of hypertension: Secondary | ICD-10-CM | POA: Diagnosis not present

## 2015-04-09 DIAGNOSIS — M542 Cervicalgia: Secondary | ICD-10-CM | POA: Diagnosis not present

## 2015-04-09 DIAGNOSIS — Z6827 Body mass index (BMI) 27.0-27.9, adult: Secondary | ICD-10-CM | POA: Diagnosis not present

## 2015-04-15 DIAGNOSIS — H4010X0 Unspecified open-angle glaucoma, stage unspecified: Secondary | ICD-10-CM | POA: Diagnosis not present

## 2015-05-15 DIAGNOSIS — I10 Essential (primary) hypertension: Secondary | ICD-10-CM | POA: Diagnosis not present

## 2015-05-15 DIAGNOSIS — I2510 Atherosclerotic heart disease of native coronary artery without angina pectoris: Secondary | ICD-10-CM | POA: Diagnosis not present

## 2015-05-15 DIAGNOSIS — E782 Mixed hyperlipidemia: Secondary | ICD-10-CM | POA: Diagnosis not present

## 2015-05-15 DIAGNOSIS — I712 Thoracic aortic aneurysm, without rupture: Secondary | ICD-10-CM | POA: Diagnosis not present

## 2015-06-10 ENCOUNTER — Telehealth: Payer: Self-pay | Admitting: Internal Medicine

## 2015-06-10 ENCOUNTER — Ambulatory Visit: Payer: Medicare Other | Admitting: Internal Medicine

## 2015-06-10 NOTE — Telephone Encounter (Signed)
FYI, Pt called stating she is not feeling well this morning and did not feel like coming in. Thank You!

## 2015-06-10 NOTE — Telephone Encounter (Signed)
F/up appt already made.

## 2015-06-13 ENCOUNTER — Other Ambulatory Visit: Payer: Self-pay | Admitting: Internal Medicine

## 2015-06-18 ENCOUNTER — Ambulatory Visit (INDEPENDENT_AMBULATORY_CARE_PROVIDER_SITE_OTHER): Payer: Medicare Other | Admitting: Internal Medicine

## 2015-06-18 ENCOUNTER — Encounter: Payer: Self-pay | Admitting: Internal Medicine

## 2015-06-18 ENCOUNTER — Telehealth: Payer: Self-pay | Admitting: *Deleted

## 2015-06-18 VITALS — BP 120/90 | HR 89 | Temp 98.0°F | Resp 18 | Ht 64.0 in | Wt 157.5 lb

## 2015-06-18 DIAGNOSIS — I10 Essential (primary) hypertension: Secondary | ICD-10-CM

## 2015-06-18 DIAGNOSIS — Z658 Other specified problems related to psychosocial circumstances: Secondary | ICD-10-CM

## 2015-06-18 DIAGNOSIS — Z86010 Personal history of colonic polyps: Secondary | ICD-10-CM

## 2015-06-18 DIAGNOSIS — I712 Thoracic aortic aneurysm, without rupture, unspecified: Secondary | ICD-10-CM

## 2015-06-18 DIAGNOSIS — M4722 Other spondylosis with radiculopathy, cervical region: Secondary | ICD-10-CM

## 2015-06-18 DIAGNOSIS — F439 Reaction to severe stress, unspecified: Secondary | ICD-10-CM

## 2015-06-18 DIAGNOSIS — K219 Gastro-esophageal reflux disease without esophagitis: Secondary | ICD-10-CM

## 2015-06-18 DIAGNOSIS — Z23 Encounter for immunization: Secondary | ICD-10-CM | POA: Diagnosis not present

## 2015-06-18 DIAGNOSIS — D649 Anemia, unspecified: Secondary | ICD-10-CM

## 2015-06-18 DIAGNOSIS — E7800 Pure hypercholesterolemia, unspecified: Secondary | ICD-10-CM

## 2015-06-18 NOTE — Progress Notes (Signed)
Pre-visit discussion using our clinic review tool. No additional management support is needed unless otherwise documented below in the visit note.  

## 2015-06-18 NOTE — Progress Notes (Signed)
Patient ID: Leah Huber, female   DOB: Nov 03, 1940, 74 y.o.   MRN: [id number]   Subjective:    Patient ID: Leah Huber, female    DOB: 15-Jul-1941, 74 y.o.   MRN: [id number]  HPI  Patient with past history of hypertension, GERD and hypercholesterolemia.  She comes in today to follow up on these issues.  She tries to stay active.  No cardiac symptoms with increased activity or exertion.  No sob.  She is watching her diet.  Has maintained her weight loss.  No abdominal pain.  Bowels doing well.  Had a flare with her neck.  Saw Dr Arnoldo Morale.  Took prednisone.  Daughter did "healing touch".  Not having issues now.  No neck pain.  Overall she feels she is doing well.  States blood pressure is doing well - averaging 110-120/70-80.     Past Medical History  Diagnosis Date  . Arthritis   . Depression   . Diverticulosis   . Hypertension   . Hypercholesterolemia   . Colon polyp   . Urinary incontinence   . Hypovolemia 12/10    acute renal failure  . Sleep apnea     very mild   sleep study 8 yrs ago does not use CPAP  . GERD (gastroesophageal reflux disease)   . Anemia   . Neuromuscular disorder ([place])     occasionally tingling in hands  . Glaucoma    Past Surgical History  Procedure Laterality Date  . Cholecystectomy  1996  . Abdominal hysterectomy  1979  . Laminectomy  1983 & 1985  . Rotator cuff repair  2000 & 2004    left and right  . Total knee arthroplasty  10/2011 & 09/13    left and right  . Tendon repair right shoulder    . Spinal cord stimulator implant  03/1997  . Bladder surgery  1981  . Foot surgery  2005  . Breast reduction surgery    . Carpal tunnel release  2007  . Spinal cord stimulator removal  2007  . Back surgery    . Anterior cervical decomp/discectomy fusion N/A 11/28/2014    Procedure: CERVICAL FIVE-SIX  ANTERIOR CERVICAL DECOMPRESSION/DISCECTOMY FUSION 1 LEVEL;  Surgeon: Newman Pies, MD;  Location: [facility];  Service: Neurosurgery;  Laterality: N/A;   C56 anterior cervical decompression with fusion interbody prosthesis plating and bonegraft   Family History  Problem Relation Age of Onset  . Heart disease Mother   . Stroke Mother   . Hypertension Mother   . Hyperlipidemia Father   . Heart disease Father   . Heart disease Brother     2 brothers  . Alzheimer's disease Brother   . Heart disease Sister     pacemaker   Social History   Social History  . Marital Status: Widowed    Spouse Name: N/A  . Number of Children: 2  . Years of Education: N/A   Social History Main Topics  . Smoking status: Former Smoker -- 1.00 packs/day for 20 years    Types: Cigarettes    Quit date: 09/07/1988  . Smokeless tobacco: Never Used  . Alcohol Use: 0.0 oz/week    0 Standard drinks or equivalent per week     Comment: occasional  . Drug Use: No  . Sexual Activity: Not Asked   Other Topics Concern  . None   Social History Narrative   She is widowed, has two daughters.    Outpatient Encounter Prescriptions as of  06/18/2015  Medication Sig  . Bimatoprost (LUMIGAN OP) Apply 1 drop to eye daily. Each eye  . DETROL LA 4 MG 24 hr capsule TAKE 1 CAPSULE DAILY  . pantoprazole (PROTONIX) 40 MG tablet TAKE 1 TABLET TWICE A DAY  . pravastatin (PRAVACHOL) 40 MG tablet TAKE 1 TABLET DAILY   No facility-administered encounter medications on file as of 06/18/2015.    Review of Systems  Constitutional: Negative for appetite change and unexpected weight change.       Has adjusted her diet and lost weight.   HENT: Negative for congestion and sinus pressure.   Respiratory: Negative for cough, chest tightness and shortness of breath.   Cardiovascular: Negative for chest pain, palpitations and leg swelling.  Gastrointestinal: Negative for nausea, vomiting, abdominal pain and diarrhea.  Musculoskeletal: Negative for back pain and joint swelling.       No neck pain now.   Neurological: Negative for dizziness, light-headedness and headaches.    Psychiatric/Behavioral: Negative for dysphoric mood and agitation.       Handling stress relatively well.  Daughter diagnosed with breast cancer (recently).         Objective:     Blood pressure rechecked by me:  146/84  Physical Exam  Constitutional: She appears well-developed and well-nourished. No distress.  HENT:  Nose: Nose normal.  Mouth/Throat: Oropharynx is clear and moist.  Eyes: Conjunctivae are normal. Right eye exhibits no discharge. Left eye exhibits no discharge.  Neck: Neck supple. No thyromegaly present.  Cardiovascular: Normal rate and regular rhythm.   Pulmonary/Chest: Breath sounds normal. No respiratory distress. She has no wheezes.  Abdominal: Soft. Bowel sounds are normal. There is no tenderness.  Musculoskeletal: She exhibits no edema or tenderness.  Lymphadenopathy:    She has no cervical adenopathy.  Skin: No rash noted. No erythema.  Psychiatric: She has a normal mood and affect. Her behavior is normal.    BP 120/90 mmHg  Pulse 89  Temp(Src) 98 F (36.7 C) (Oral)  Resp 18  Ht '5\' 4"'  (1.626 m)  Wt 157 lb 8 oz (71.442 kg)  BMI 27.02 kg/m2  SpO2 96%  LMP 08/15/1978 Wt Readings from Last 3 Encounters:  06/18/15 157 lb 8 oz (71.442 kg)  02/06/15 158 lb 8 oz (71.895 kg)  01/22/15 164 lb (74.39 kg)     Lab Results  Component Value Date   WBC 6.2 02/27/2015   HGB 13.0 02/27/2015   HCT 39.4 02/27/2015   PLT 294.0 02/27/2015   GLUCOSE 89 03/07/2015   CHOL 218* 03/07/2015   TRIG 152.0* 03/07/2015   HDL 72.20 03/07/2015   LDLDIRECT 147.1 05/30/2013   LDLCALC 115* 03/07/2015   ALT 19 03/07/2015   AST 31 03/07/2015   NA 140 03/07/2015   K 4.8 03/07/2015   K 4.8 03/07/2015   CL 102 03/07/2015   CREATININE 0.85 03/07/2015   BUN 20 03/07/2015   CO2 30 03/07/2015   TSH 2.60 08/20/2014   INR 1.0 10/16/2013       Assessment & Plan:   Problem List Items Addressed This Visit    Anemia    Follow cbc.       Aortic aneurysm (HCC)    Found  to have ascending aortic aneurysm on CT.  See CT report for details.  Referred to vascular surgery.  Did not keep appt.  Was checked by cardiology.  States was rechecked recently and everything stable.  Follow.        Cervical spondylosis  with radiculopathy    S/p surgery.  Followed by Dr Arnoldo Morale.  Recent flare.  Had prednisone.  Follow.  Is better.        GERD (gastroesophageal reflux disease)    Symptoms controlled on protonix.  Follow.        History of colonic polyps    Colonoscopy 01/19/14 - diverticulosis and internal hemorrhoids.  Recommended f/u colonoscopy in 01/2019.        Hypercholesterolemia    Low cholesterol diet and exercise.  On pravastatin.  Follow met b and a1c.        Relevant Orders   Hepatic function panel   Lipid panel   Hypertension    Blood pressure has been under good control.  On recheck, blood pressure slightly elevated.  Continue same medication regimen.  Follow pressures.  Follow metabolic panel.  Get her back in soon to reassess.        Relevant Orders   Basic metabolic panel   Stress    States she is handling things well.  Follow.         Other Visit Diagnoses    Encounter for immunization    -  Primary        Einar Pheasant, MD

## 2015-06-18 NOTE — Telephone Encounter (Signed)
Patient will need a follow up appt around 07/16/15. Please advise where to place patient. Patient stated that she will be out of town the last week of November and October 21,2016. Thanks

## 2015-06-18 NOTE — Telephone Encounter (Signed)
Tues. 07/23/15 @ 11:30am

## 2015-06-18 NOTE — Patient Instructions (Signed)

## 2015-06-24 ENCOUNTER — Encounter: Payer: Self-pay | Admitting: Internal Medicine

## 2015-06-24 NOTE — Assessment & Plan Note (Signed)
Blood pressure has been under good control.  On recheck, blood pressure slightly elevated.  Continue same medication regimen.  Follow pressures.  Follow metabolic panel.  Get her back in soon to reassess.

## 2015-06-24 NOTE — Assessment & Plan Note (Signed)
Found to have ascending aortic aneurysm on CT.  See CT report for details.  Referred to vascular surgery.  Did not keep appt.  Was checked by cardiology.  States was rechecked recently and everything stable.  Follow.

## 2015-06-24 NOTE — Assessment & Plan Note (Signed)
S/p surgery.  Followed by Dr Arnoldo Morale.  Recent flare.  Had prednisone.  Follow.  Is better.

## 2015-06-24 NOTE — Assessment & Plan Note (Signed)
Colonoscopy 01/19/14 - diverticulosis and internal hemorrhoids.  Recommended f/u colonoscopy in 01/2019.

## 2015-06-24 NOTE — Assessment & Plan Note (Signed)
Follow cbc.  

## 2015-06-24 NOTE — Assessment & Plan Note (Signed)
Low cholesterol diet and exercise.  On pravastatin.  Follow met b and a1c.

## 2015-06-24 NOTE — Assessment & Plan Note (Signed)
Symptoms controlled on protonix.  Follow.   

## 2015-06-24 NOTE — Assessment & Plan Note (Signed)
States she is handling things well.  Follow.

## 2015-07-19 ENCOUNTER — Other Ambulatory Visit (INDEPENDENT_AMBULATORY_CARE_PROVIDER_SITE_OTHER): Payer: Medicare Other

## 2015-07-19 DIAGNOSIS — I10 Essential (primary) hypertension: Secondary | ICD-10-CM

## 2015-07-19 DIAGNOSIS — E7800 Pure hypercholesterolemia, unspecified: Secondary | ICD-10-CM | POA: Diagnosis not present

## 2015-07-19 LAB — LIPID PANEL
Cholesterol: 209 mg/dL — ABNORMAL HIGH (ref 0–200)
HDL: 90.9 mg/dL (ref >39.00)
LDL Cholesterol: 97 mg/dL (ref 0–99)
NONHDL: 117.97
Total CHOL/HDL Ratio: 2
Triglycerides: 104 mg/dL (ref 0.0–149.0)
VLDL: 20.8 mg/dL (ref 0.0–40.0)

## 2015-07-19 LAB — HEPATIC FUNCTION PANEL
ALBUMIN: 4.5 g/dL (ref 3.5–5.2)
ALT: 17 U/L (ref 0–35)
AST: 24 U/L (ref 0–37)
Alkaline Phosphatase: 51 U/L (ref 39–117)
Bilirubin, Direct: 0.1 mg/dL (ref 0.0–0.3)
TOTAL PROTEIN: 6.9 g/dL (ref 6.0–8.3)
Total Bilirubin: 0.6 mg/dL (ref 0.2–1.2)

## 2015-07-19 LAB — BASIC METABOLIC PANEL
BUN: 21 mg/dL (ref 6–23)
CALCIUM: 9.6 mg/dL (ref 8.4–10.5)
CO2: 29 mEq/L (ref 19–32)
CREATININE: 0.88 mg/dL (ref 0.40–1.20)
Chloride: 104 mEq/L (ref 96–112)
GFR: 66.47 mL/min (ref >60.00)
Glucose, Bld: 95 mg/dL (ref 70–99)
Potassium: 4.4 mEq/L (ref 3.5–5.1)
Sodium: 141 mEq/L (ref 135–145)

## 2015-07-20 ENCOUNTER — Encounter: Payer: Self-pay | Admitting: Internal Medicine

## 2015-07-23 ENCOUNTER — Ambulatory Visit: Payer: Medicare Other | Admitting: Internal Medicine

## 2015-07-23 NOTE — Telephone Encounter (Signed)
Unread mychart message mailed to patient 

## 2015-08-02 ENCOUNTER — Other Ambulatory Visit: Payer: Self-pay | Admitting: Internal Medicine

## 2015-08-15 ENCOUNTER — Other Ambulatory Visit: Payer: Self-pay | Admitting: Internal Medicine

## 2015-08-20 ENCOUNTER — Ambulatory Visit (INDEPENDENT_AMBULATORY_CARE_PROVIDER_SITE_OTHER): Payer: Medicare Other | Admitting: Internal Medicine

## 2015-08-20 ENCOUNTER — Encounter: Payer: Self-pay | Admitting: Internal Medicine

## 2015-08-20 VITALS — BP 130/90 | HR 77 | Temp 98.0°F | Resp 18 | Ht 64.0 in | Wt 157.0 lb

## 2015-08-20 DIAGNOSIS — I712 Thoracic aortic aneurysm, without rupture, unspecified: Secondary | ICD-10-CM

## 2015-08-20 DIAGNOSIS — Z658 Other specified problems related to psychosocial circumstances: Secondary | ICD-10-CM

## 2015-08-20 DIAGNOSIS — I10 Essential (primary) hypertension: Secondary | ICD-10-CM

## 2015-08-20 DIAGNOSIS — M4722 Other spondylosis with radiculopathy, cervical region: Secondary | ICD-10-CM

## 2015-08-20 DIAGNOSIS — D649 Anemia, unspecified: Secondary | ICD-10-CM

## 2015-08-20 DIAGNOSIS — K219 Gastro-esophageal reflux disease without esophagitis: Secondary | ICD-10-CM | POA: Diagnosis not present

## 2015-08-20 DIAGNOSIS — E7800 Pure hypercholesterolemia, unspecified: Secondary | ICD-10-CM

## 2015-08-20 DIAGNOSIS — F439 Reaction to severe stress, unspecified: Secondary | ICD-10-CM

## 2015-08-20 DIAGNOSIS — Z86010 Personal history of colonic polyps: Secondary | ICD-10-CM

## 2015-08-20 NOTE — Progress Notes (Signed)
Patient ID: Leah Huber, female   DOB: Feb 09, 1941, 74 y.o.   MRN: [id number]   Subjective:    Patient ID: Leah Huber, female    DOB: 1940-09-30, 74 y.o.   MRN: [id number]  HPI  Patient with past history of hypercholesterolemia, GERD, anemia, depression and hypertension.  She comes in today to follow up on these issues.  She has been under increased stress recently.  Her niece and her brother died recently.  09-03-23 is also the anniversary of her husband's death.  Discussed with her today.  She feels she is coping relatively well.  Tries to stay active.  Sleeping and eating ok.  No cardiac symptoms with increased activity or exertion.  Her blood pressures have been varying.  Outside checks in 07/2015 were 140's/90-100s.  September 03, 2023 has been better - 120s/70s.  She has good family support.  Does not feel she needs anything more.  No abdominal pain or cramping.  Bowels stable.     Past Medical History  Diagnosis Date  . Arthritis   . Depression   . Diverticulosis   . Hypertension   . Hypercholesterolemia   . Colon polyp   . Urinary incontinence   . Hypovolemia 12/10    acute renal failure  . Sleep apnea     very mild   sleep study 8 yrs ago does not use CPAP  . GERD (gastroesophageal reflux disease)   . Anemia   . Neuromuscular disorder ([place])     occasionally tingling in hands  . Glaucoma    Past Surgical History  Procedure Laterality Date  . Cholecystectomy  1996  . Abdominal hysterectomy  1979  . Laminectomy  1983 & 1985  . Rotator cuff repair  2000 & 2004    left and right  . Total knee arthroplasty  10/2011 & 09/13    left and right  . Tendon repair right shoulder    . Spinal cord stimulator implant  03/1997  . Bladder surgery  1981  . Foot surgery  2005  . Breast reduction surgery    . Carpal tunnel release  2007  . Spinal cord stimulator removal  2007  . Back surgery    . Anterior cervical decomp/discectomy fusion N/A 11/28/2014    Procedure: CERVICAL FIVE-SIX   ANTERIOR CERVICAL DECOMPRESSION/DISCECTOMY FUSION 1 LEVEL;  Surgeon: Newman Pies, MD;  Location: [facility];  Service: Neurosurgery;  Laterality: N/A;  C56 anterior cervical decompression with fusion interbody prosthesis plating and bonegraft   Family History  Problem Relation Age of Onset  . Heart disease Mother   . Stroke Mother   . Hypertension Mother   . Hyperlipidemia Father   . Heart disease Father   . Heart disease Brother     2 brothers  . Alzheimer's disease Brother   . Heart disease Sister     pacemaker   Social History   Social History  . Marital Status: Widowed    Spouse Name: N/A  . Number of Children: 2  . Years of Education: N/A   Social History Main Topics  . Smoking status: Former Smoker -- 1.00 packs/day for 20 years    Types: Cigarettes    Quit date: 09/07/1988  . Smokeless tobacco: Never Used  . Alcohol Use: 0.0 oz/week    0 Standard drinks or equivalent per week     Comment: occasional  . Drug Use: No  . Sexual Activity: Not Asked   Other Topics Concern  . None  Social History Narrative   She is widowed, has two daughters.    Outpatient Encounter Prescriptions as of 08/20/2015  Medication Sig  . Bimatoprost (LUMIGAN OP) Apply 1 drop to eye daily. Each eye  . DETROL LA 4 MG 24 hr capsule TAKE 1 CAPSULE DAILY  . pantoprazole (PROTONIX) 40 MG tablet TAKE 1 TABLET TWICE A DAY  . pravastatin (PRAVACHOL) 40 MG tablet TAKE 1 TABLET DAILY   No facility-administered encounter medications on file as of 08/20/2015.    Review of Systems  Constitutional: Negative for appetite change and unexpected weight change.  HENT: Negative for congestion and sinus pressure.   Eyes: Negative for pain and discharge.  Respiratory: Negative for cough, chest tightness and shortness of breath.   Cardiovascular: Negative for chest pain, palpitations and leg swelling.  Gastrointestinal: Negative for nausea, vomiting, abdominal pain and diarrhea.  Genitourinary:  Negative for dysuria and difficulty urinating.  Musculoskeletal: Negative for back pain and joint swelling.  Skin: Negative for color change and rash.  Neurological: Negative for dizziness, light-headedness and headaches.  Psychiatric/Behavioral: Negative for agitation.       Increased stress as outlined.         Objective:     Blood pressure rechecked by me:  140/84  Physical Exam  Constitutional: She appears well-developed and well-nourished. No distress.  HENT:  Nose: Nose normal.  Mouth/Throat: Oropharynx is clear and moist.  Eyes: Conjunctivae are normal. Right eye exhibits no discharge. Left eye exhibits no discharge.  Neck: Neck supple. No thyromegaly present.  Cardiovascular: Normal rate and regular rhythm.   No radial pulse palpated - right arm - unchanged.    Pulmonary/Chest: Breath sounds normal. No respiratory distress. She has no wheezes.  Abdominal: Soft. Bowel sounds are normal. There is no tenderness.  Musculoskeletal: She exhibits no edema or tenderness.  Lymphadenopathy:    She has no cervical adenopathy.  Skin: No rash noted. No erythema.  Psychiatric: She has a normal mood and affect. Her behavior is normal.    BP 130/90 mmHg  Pulse 77  Temp(Src) 98 F (36.7 C) (Oral)  Resp 18  Ht 5\' 4"  (1.626 m)  Wt 157 lb (71.215 kg)  BMI 26.94 kg/m2  SpO2 98%  LMP 08/15/1978 Wt Readings from Last 3 Encounters:  08/20/15 157 lb (71.215 kg)  06/18/15 157 lb 8 oz (71.442 kg)  02/06/15 158 lb 8 oz (71.895 kg)     Lab Results  Component Value Date   WBC 6.2 02/27/2015   HGB 13.0 02/27/2015   HCT 39.4 02/27/2015   PLT 294.0 02/27/2015   GLUCOSE 95 07/19/2015   CHOL 209* 07/19/2015   TRIG 104.0 07/19/2015   HDL 90.90 07/19/2015   LDLDIRECT 147.1 05/30/2013   LDLCALC 97 07/19/2015   ALT 17 07/19/2015   AST 24 07/19/2015   NA 141 07/19/2015   K 4.4 07/19/2015   CL 104 07/19/2015   CREATININE 0.88 07/19/2015   BUN 21 07/19/2015   CO2 29 07/19/2015   TSH  2.60 08/20/2014   INR 1.0 10/16/2013       Assessment & Plan:   Problem List Items Addressed This Visit    Anemia    Follow cbc.        Aortic aneurysm (HCC)    Found to have ascending aortic aneurysm on CT.  See CT report for details.  Seeing cardiology.  See Dr Alveria Apley note for details.  Follow.        Cervical spondylosis with  radiculopathy    S/p surgery.  Followed by Dr Arnoldo Morale.  Currently doing well.  Follow.        GERD (gastroesophageal reflux disease)    Symptoms controlled on protonix.  Follow.        History of colonic polyps    Colonoscopy 01/19/14 - diverticulosis and internal hemorrhoids.  Recommended f/u colonoscopy in 01/2019.        Hypercholesterolemia    On pravastatin.  Low cholesterol diet and exercise.  Follow lipid panel and liver function tests.        Relevant Orders   Lipid panel   Hepatic function panel   Hypertension - Primary    Blood pressure as outlined.  On no medication.  She will spot check her pressure.  If persistent elevation, will restart a low dose of diovan.  Has taken and tolerated previously.        Relevant Orders   TSH   Basic metabolic panel   Stress    Increased stress as outlined.  Does not feel she needs anything more at this time.  Has good support.  Follow.            Einar Pheasant, MD

## 2015-08-20 NOTE — Progress Notes (Signed)
Pre-visit discussion using our clinic review tool. No additional management support is needed unless otherwise documented below in the visit note.  

## 2015-08-26 ENCOUNTER — Encounter: Payer: Self-pay | Admitting: Internal Medicine

## 2015-08-26 NOTE — Assessment & Plan Note (Signed)
Follow cbc.  

## 2015-08-26 NOTE — Assessment & Plan Note (Signed)
S/p surgery.  Followed by Dr Arnoldo Morale.  Currently doing well.  Follow.

## 2015-08-26 NOTE — Assessment & Plan Note (Signed)
Increased stress as outlined.  Does not feel she needs anything more at this time.  Has good support.  Follow.

## 2015-08-26 NOTE — Assessment & Plan Note (Signed)
Colonoscopy 01/19/14 - diverticulosis and internal hemorrhoids.  Recommended f/u colonoscopy in 01/2019.   

## 2015-08-26 NOTE — Assessment & Plan Note (Signed)
On pravastatin.  Low cholesterol diet and exercise.  Follow lipid panel and liver function tests.   

## 2015-08-26 NOTE — Assessment & Plan Note (Signed)
Symptoms controlled on protonix.  Follow.   

## 2015-08-26 NOTE — Assessment & Plan Note (Signed)
Found to have ascending aortic aneurysm on CT.  See CT report for details.  Seeing cardiology.  See Dr Alveria Apley note for details.  Follow.

## 2015-08-26 NOTE — Assessment & Plan Note (Signed)
Blood pressure as outlined.  On no medication.  She will spot check her pressure.  If persistent elevation, will restart a low dose of diovan.  Has taken and tolerated previously.

## 2015-09-09 ENCOUNTER — Other Ambulatory Visit: Payer: Self-pay | Admitting: Internal Medicine

## 2015-10-08 DIAGNOSIS — M542 Cervicalgia: Secondary | ICD-10-CM | POA: Diagnosis not present

## 2015-10-08 DIAGNOSIS — Z6827 Body mass index (BMI) 27.0-27.9, adult: Secondary | ICD-10-CM | POA: Diagnosis not present

## 2015-10-08 DIAGNOSIS — R030 Elevated blood-pressure reading, without diagnosis of hypertension: Secondary | ICD-10-CM | POA: Diagnosis not present

## 2015-10-14 DIAGNOSIS — H4010X0 Unspecified open-angle glaucoma, stage unspecified: Secondary | ICD-10-CM | POA: Diagnosis not present

## 2015-10-21 DIAGNOSIS — H401131 Primary open-angle glaucoma, bilateral, mild stage: Secondary | ICD-10-CM | POA: Diagnosis not present

## 2015-11-04 ENCOUNTER — Other Ambulatory Visit (INDEPENDENT_AMBULATORY_CARE_PROVIDER_SITE_OTHER): Payer: Medicare Other

## 2015-11-04 DIAGNOSIS — I10 Essential (primary) hypertension: Secondary | ICD-10-CM | POA: Diagnosis not present

## 2015-11-04 DIAGNOSIS — E7800 Pure hypercholesterolemia, unspecified: Secondary | ICD-10-CM | POA: Diagnosis not present

## 2015-11-04 LAB — HEPATIC FUNCTION PANEL
ALT: 16 U/L (ref 0–35)
AST: 23 U/L (ref 0–37)
Albumin: 4.7 g/dL (ref 3.5–5.2)
Alkaline Phosphatase: 54 U/L (ref 39–117)
BILIRUBIN DIRECT: 0.1 mg/dL (ref 0.0–0.3)
BILIRUBIN TOTAL: 0.4 mg/dL (ref 0.2–1.2)
TOTAL PROTEIN: 7.2 g/dL (ref 6.0–8.3)

## 2015-11-04 LAB — BASIC METABOLIC PANEL
BUN: 19 mg/dL (ref 6–23)
CHLORIDE: 103 meq/L (ref 96–112)
CO2: 27 mEq/L (ref 19–32)
Calcium: 9.7 mg/dL (ref 8.4–10.5)
Creatinine, Ser: 0.73 mg/dL (ref 0.40–1.20)
GFR: 82.4 mL/min (ref >60.00)
GLUCOSE: 93 mg/dL (ref 70–99)
POTASSIUM: 4.4 meq/L (ref 3.5–5.1)
Sodium: 139 mEq/L (ref 135–145)

## 2015-11-04 LAB — TSH: TSH: 1.53 u[IU]/mL (ref 0.35–4.50)

## 2015-11-04 LAB — LIPID PANEL
CHOL/HDL RATIO: 3
Cholesterol: 269 mg/dL — ABNORMAL HIGH (ref 0–200)
HDL: 87.7 mg/dL (ref >39.00)
LDL CALC: 154 mg/dL — AB (ref 0–99)
NonHDL: 181.7
Triglycerides: 137 mg/dL (ref 0.0–149.0)
VLDL: 27.4 mg/dL (ref 0.0–40.0)

## 2015-11-05 ENCOUNTER — Encounter: Payer: Self-pay | Admitting: Internal Medicine

## 2015-11-08 ENCOUNTER — Ambulatory Visit (INDEPENDENT_AMBULATORY_CARE_PROVIDER_SITE_OTHER): Payer: Medicare Other | Admitting: Internal Medicine

## 2015-11-08 ENCOUNTER — Encounter: Payer: Self-pay | Admitting: Internal Medicine

## 2015-11-08 VITALS — BP 120/80 | HR 78 | Temp 97.5°F | Resp 18 | Ht 64.0 in | Wt 160.5 lb

## 2015-11-08 DIAGNOSIS — F439 Reaction to severe stress, unspecified: Secondary | ICD-10-CM

## 2015-11-08 DIAGNOSIS — E7800 Pure hypercholesterolemia, unspecified: Secondary | ICD-10-CM

## 2015-11-08 DIAGNOSIS — Z86010 Personal history of colonic polyps: Secondary | ICD-10-CM

## 2015-11-08 DIAGNOSIS — I712 Thoracic aortic aneurysm, without rupture, unspecified: Secondary | ICD-10-CM

## 2015-11-08 DIAGNOSIS — Z658 Other specified problems related to psychosocial circumstances: Secondary | ICD-10-CM

## 2015-11-08 DIAGNOSIS — I10 Essential (primary) hypertension: Secondary | ICD-10-CM | POA: Diagnosis not present

## 2015-11-08 DIAGNOSIS — R32 Unspecified urinary incontinence: Secondary | ICD-10-CM

## 2015-11-08 DIAGNOSIS — K219 Gastro-esophageal reflux disease without esophagitis: Secondary | ICD-10-CM

## 2015-11-08 DIAGNOSIS — Z0000 Encounter for general adult medical examination without abnormal findings: Secondary | ICD-10-CM

## 2015-11-08 DIAGNOSIS — Z1239 Encounter for other screening for malignant neoplasm of breast: Secondary | ICD-10-CM

## 2015-11-08 DIAGNOSIS — H409 Unspecified glaucoma: Secondary | ICD-10-CM

## 2015-11-08 MED ORDER — DARIFENACIN HYDROBROMIDE ER 7.5 MG PO TB24: 7.5000 mg | ORAL_TABLET | Freq: Every day | ORAL | Status: DC

## 2015-11-08 NOTE — Progress Notes (Signed)
Pre-visit discussion using our clinic review tool. No additional management support is needed unless otherwise documented below in the visit note.  

## 2015-11-08 NOTE — Progress Notes (Signed)
Patient ID: Leah Huber, female   DOB: [birth year], 75 y.o.   MRN: [id number]   Subjective:    Patient ID: Leah Huber, female    DOB: 1941/05/08, 75 y.o.   MRN: [id number]  HPI  Patient with past history of hypercholesterolemia, GERD and hypertension.  She comes in today to follow up on these issues as well as for a complete physical exam.  She reports increased stress.  Dealing with her brother-n-law's death.  Has had other recent deaths.  Has good support.  Does not feel she needs anything more at this time.  She has been out of her exercise routine.  Also not watching her diet.  Plans to get started back with her exercise and diet.  She reports her outside blood pressure checks average 130/70s.  Has recently been evaluated by eye doctor.  Pressure 26.  They are following.  Feels enablex works better for her than detrol.  Wants to change back.     Past Medical History  Diagnosis Date  . Arthritis   . Depression   . Diverticulosis   . Hypertension   . Hypercholesterolemia   . Colon polyp   . Urinary incontinence   . Hypovolemia 12/10    acute renal failure  . Sleep apnea     very mild   sleep study 8 yrs ago does not use CPAP  . GERD (gastroesophageal reflux disease)   . Anemia   . Neuromuscular disorder ([place])     occasionally tingling in hands  . Glaucoma    Past Surgical History  Procedure Laterality Date  . Cholecystectomy  1996  . Abdominal hysterectomy  1979  . Laminectomy  1983 & 1985  . Rotator cuff repair  2000 & 2004    left and right  . Total knee arthroplasty  10/2011 & 09/13    left and right  . Tendon repair right shoulder    . Spinal cord stimulator implant  03/1997  . Bladder surgery  1981  . Foot surgery  2005  . Breast reduction surgery    . Carpal tunnel release  2007  . Spinal cord stimulator removal  2007  . Back surgery    . Anterior cervical decomp/discectomy fusion N/A 11/28/2014    Procedure: CERVICAL FIVE-SIX  ANTERIOR CERVICAL  DECOMPRESSION/DISCECTOMY FUSION 1 LEVEL;  Surgeon: Newman Pies, MD;  Location: [facility];  Service: Neurosurgery;  Laterality: N/A;  C56 anterior cervical decompression with fusion interbody prosthesis plating and bonegraft   Family History  Problem Relation Age of Onset  . Heart disease Mother   . Stroke Mother   . Hypertension Mother   . Hyperlipidemia Father   . Heart disease Father   . Heart disease Brother     2 brothers  . Alzheimer's disease Brother   . Heart disease Sister     pacemaker   Social History   Social History  . Marital Status: Widowed    Spouse Name: N/A  . Number of Children: 2  . Years of Education: N/A   Social History Main Topics  . Smoking status: Former Smoker -- 1.00 packs/day for 20 years    Types: Cigarettes    Quit date: 09/07/1988  . Smokeless tobacco: Never Used  . Alcohol Use: 0.0 oz/week    0 Standard drinks or equivalent per week     Comment: occasional  . Drug Use: No  . Sexual Activity: Not Asked   Other Topics Concern  . None  Social History Narrative   She is widowed, has two daughters.    Outpatient Encounter Prescriptions as of 11/08/2015  Medication Sig  . DETROL LA 4 MG 24 hr capsule TAKE 1 CAPSULE DAILY  . latanoprost (XALATAN) 0.005 % ophthalmic solution   . pantoprazole (PROTONIX) 40 MG tablet TAKE 1 TABLET TWICE A DAY  . pravastatin (PRAVACHOL) 40 MG tablet TAKE 1 TABLET DAILY  . timolol (BETIMOL) 0.5 % ophthalmic solution Place 1 drop into both eyes daily.  Marland Kitchen darifenacin (ENABLEX) 7.5 MG 24 hr tablet Take 1 tablet (7.5 mg total) by mouth daily.  . [DISCONTINUED] Bimatoprost (LUMIGAN OP) Apply 1 drop to eye daily. Each eye   No facility-administered encounter medications on file as of 11/08/2015.    Review of Systems  Constitutional: Negative for appetite change.       Not watching her diet.  Not exercising.    HENT: Negative for congestion and sinus pressure.   Eyes: Negative for pain and visual disturbance.   Respiratory: Negative for cough, chest tightness and shortness of breath.   Cardiovascular: Negative for chest pain, palpitations and leg swelling.  Gastrointestinal: Negative for nausea, vomiting, abdominal pain and diarrhea.  Genitourinary: Negative for dysuria and difficulty urinating.       Some increased frequency.   Musculoskeletal: Negative for back pain and joint swelling.  Skin: Negative for color change and rash.  Neurological: Negative for dizziness, light-headedness and headaches.  Hematological: Negative for adenopathy. Does not bruise/bleed easily.  Psychiatric/Behavioral: Negative for dysphoric mood and agitation.       Objective:    Physical Exam  Constitutional: She is oriented to person, place, and time. She appears well-developed and well-nourished. No distress.  HENT:  Nose: Nose normal.  Mouth/Throat: Oropharynx is clear and moist.  Eyes: Right eye exhibits no discharge. Left eye exhibits no discharge. No scleral icterus.  Neck: Neck supple. No thyromegaly present.  Cardiovascular: Normal rate and regular rhythm.   Pulmonary/Chest: Breath sounds normal. No accessory muscle usage. No tachypnea. No respiratory distress. She has no decreased breath sounds. She has no wheezes. She has no rhonchi. Right breast exhibits no inverted nipple, no mass, no nipple discharge and no tenderness (no axillary adenopathy). Left breast exhibits no inverted nipple, no mass, no nipple discharge and no tenderness (no axilarry adenopathy).  Abdominal: Soft. Bowel sounds are normal. There is no tenderness.  Musculoskeletal: She exhibits no edema or tenderness.  Lymphadenopathy:    She has no cervical adenopathy.  Neurological: She is alert and oriented to person, place, and time.  Skin: Skin is warm. No rash noted. No erythema.  Psychiatric: She has a normal mood and affect. Her behavior is normal.    BP 120/80 mmHg  Pulse 78  Temp(Src) 97.5 F (36.4 C) (Oral)  Resp 18  Ht 5\' 4"   (1.626 m)  Wt 160 lb 8 oz (72.802 kg)  BMI 27.54 kg/m2  SpO2 95%  LMP 08/15/1978 Wt Readings from Last 3 Encounters:  11/08/15 160 lb 8 oz (72.802 kg)  08/20/15 157 lb (71.215 kg)  06/18/15 157 lb 8 oz (71.442 kg)     Lab Results  Component Value Date   WBC 6.2 02/27/2015   HGB 13.0 02/27/2015   HCT 39.4 02/27/2015   PLT 294.0 02/27/2015   GLUCOSE 93 11/04/2015   CHOL 269* 11/04/2015   TRIG 137.0 11/04/2015   HDL 87.70 11/04/2015   LDLDIRECT 147.1 05/30/2013   LDLCALC 154* 11/04/2015   ALT 16 11/04/2015  AST 23 11/04/2015   NA 139 11/04/2015   K 4.4 11/04/2015   CL 103 11/04/2015   CREATININE 0.73 11/04/2015   BUN 19 11/04/2015   CO2 27 11/04/2015   TSH 1.53 11/04/2015   INR 1.0 10/16/2013       Assessment & Plan:   Problem List Items Addressed This Visit    Aortic aneurysm ([place])    Found to have ascending aortic aneurysm on CT.  Seeing cardiology.  See Dr Alveria Apley note for details.        GERD (gastroesophageal reflux disease)    On protonix.  No symptoms reported.        Glaucoma    Recently reevaluated by eye md.  Increased pressure.  Continue f/u with opthalmology.        Relevant Medications   timolol (BETIMOL) 0.5 % ophthalmic solution   latanoprost (XALATAN) 0.005 % ophthalmic solution   Health care maintenance    Physical today 11/08/15.  Mammogram 11/14/14 - Birads I.  Mammogram ordered.  Colonoscopy 01/2014.  Recommended f/u colonoscopy in 01/2019.        History of colonic polyps    Colonoscopy 01/2014.  Recommended f/u colonoscopy in 01/2019.        Hypercholesterolemia    Cholesterol increased on recent check.  LDL 154.  On pravastatin.  States she has been taking regularly.  Has been under good control.  Low cholesterol diet and exercise.  Hold on making changes to her medications.  Follow.  Recheck lipid panel with next labs.  If persistent elevation, will change medication.        Hypertension    Blood pressure on her checks have been  doing well.  Was a little elevated on my recheck.  Recheck prior to leaving, 134/84.  Continue to monitor pressures.  Follow.  Check metabolic panel.        Stress    Increased stress as outlined.  Discussed with her today.  She desires no further intervention at this time.  Follow.        Urinary incontinence    Feels enablex works better.  Will stop detrol and start enablex.        Relevant Medications   darifenacin (ENABLEX) 7.5 MG 24 hr tablet    Other Visit Diagnoses    Screening breast examination    -  Primary    Relevant Orders    MM DIGITAL SCREENING BILATERAL        Einar Pheasant, MD

## 2015-11-09 ENCOUNTER — Encounter: Payer: Self-pay | Admitting: Internal Medicine

## 2015-11-09 NOTE — Assessment & Plan Note (Signed)
Recently reevaluated by eye md.  Increased pressure.  Continue f/u with opthalmology.

## 2015-11-09 NOTE — Assessment & Plan Note (Signed)
Found to have ascending aortic aneurysm on CT.  Seeing cardiology.  See Dr Alveria Apley note for details.

## 2015-11-09 NOTE — Assessment & Plan Note (Signed)
Blood pressure on her checks have been doing well.  Was a little elevated on my recheck.  Recheck prior to leaving, 134/84.  Continue to monitor pressures.  Follow.  Check metabolic panel.

## 2015-11-09 NOTE — Assessment & Plan Note (Signed)
Physical today 11/08/15.  Mammogram 11/14/14 - Birads I.  Mammogram ordered.  Colonoscopy 01/2014.  Recommended f/u colonoscopy in 01/2019.

## 2015-11-09 NOTE — Assessment & Plan Note (Signed)
On protonix.  No symptoms reported.  

## 2015-11-09 NOTE — Assessment & Plan Note (Signed)
Colonoscopy 01/2014.  Recommended f/u colonoscopy in 01/2019.

## 2015-11-09 NOTE — Assessment & Plan Note (Signed)
Increased stress as outlined.  Discussed with her today.  She desires no further intervention at this time.  Follow.  

## 2015-11-09 NOTE — Assessment & Plan Note (Signed)
Cholesterol increased on recent check.  LDL 154.  On pravastatin.  States she has been taking regularly.  Has been under good control.  Low cholesterol diet and exercise.  Hold on making changes to her medications.  Follow.  Recheck lipid panel with next labs.  If persistent elevation, will change medication.

## 2015-11-09 NOTE — Assessment & Plan Note (Signed)
Feels enablex works better.  Will stop detrol and start enablex.

## 2015-11-14 ENCOUNTER — Encounter: Payer: Self-pay | Admitting: Internal Medicine

## 2015-11-15 ENCOUNTER — Encounter: Payer: Self-pay | Admitting: Internal Medicine

## 2015-11-21 DIAGNOSIS — H401131 Primary open-angle glaucoma, bilateral, mild stage: Secondary | ICD-10-CM | POA: Diagnosis not present

## 2015-11-28 ENCOUNTER — Ambulatory Visit
Admission: RE | Admit: 2015-11-28 | Discharge: 2015-11-28 | Disposition: A | Discharge status: Home / Self Care | Payer: Medicare Other | Source: Ambulatory Visit | Attending: Internal Medicine | Admitting: Internal Medicine

## 2015-11-28 ENCOUNTER — Other Ambulatory Visit: Payer: Self-pay | Admitting: Internal Medicine

## 2015-11-28 DIAGNOSIS — Z1231 Encounter for screening mammogram for malignant neoplasm of breast: Secondary | ICD-10-CM | POA: Diagnosis not present

## 2015-11-28 DIAGNOSIS — Z1239 Encounter for other screening for malignant neoplasm of breast: Secondary | ICD-10-CM

## 2016-01-03 ENCOUNTER — Telehealth: Payer: Self-pay | Admitting: Internal Medicine

## 2016-01-03 NOTE — Telephone Encounter (Signed)
Left msg to call office to schedule AWV with Denisa/msn °

## 2016-01-06 DIAGNOSIS — H401131 Primary open-angle glaucoma, bilateral, mild stage: Secondary | ICD-10-CM | POA: Diagnosis not present

## 2016-01-06 DIAGNOSIS — H01003 Unspecified blepharitis right eye, unspecified eyelid: Secondary | ICD-10-CM | POA: Diagnosis not present

## 2016-01-25 ENCOUNTER — Encounter: Payer: Self-pay | Admitting: Internal Medicine

## 2016-01-27 MED ORDER — TOLTERODINE TARTRATE ER 4 MG PO CP24: 4.0000 mg | ORAL_CAPSULE | Freq: Every day | ORAL | Status: DC

## 2016-01-27 NOTE — Telephone Encounter (Signed)
rx sent in for detrol #90 with [facility]ill [facility].

## 2016-02-04 ENCOUNTER — Ambulatory Visit (INDEPENDENT_AMBULATORY_CARE_PROVIDER_SITE_OTHER): Payer: Medicare Other | Admitting: Internal Medicine

## 2016-02-04 ENCOUNTER — Encounter: Payer: Self-pay | Admitting: Internal Medicine

## 2016-02-04 VITALS — BP 138/80 | HR 65 | Temp 98.0°F | Resp 18 | Ht 64.0 in | Wt 164.0 lb

## 2016-02-04 DIAGNOSIS — I10 Essential (primary) hypertension: Secondary | ICD-10-CM | POA: Diagnosis not present

## 2016-02-04 DIAGNOSIS — E7800 Pure hypercholesterolemia, unspecified: Secondary | ICD-10-CM

## 2016-02-04 DIAGNOSIS — H409 Unspecified glaucoma: Secondary | ICD-10-CM

## 2016-02-04 DIAGNOSIS — K219 Gastro-esophageal reflux disease without esophagitis: Secondary | ICD-10-CM

## 2016-02-04 DIAGNOSIS — I712 Thoracic aortic aneurysm, without rupture, unspecified: Secondary | ICD-10-CM

## 2016-02-04 MED ORDER — VALSARTAN 80 MG PO TABS: 80.0000 mg | ORAL_TABLET | Freq: Every day | ORAL | Status: DC

## 2016-02-04 NOTE — Progress Notes (Signed)
Patient ID: Leah Huber, female   DOB: 1941-08-02, 75 y.o.   MRN: [id number]   Subjective:    Patient ID: Leah Huber, female    DOB: [birth year], 75 y.o.   MRN: [id number]  HPI  Patient here for a scheduled follow up. She is seeing opthalmology for increased pressure.  They are adjusting her drops.  States her blood pressure has been averaging 141-151/80s.  No chest pain.  No sob.  No acid reflux.  No abdominal pain or cramping.  Bowels stable.  She feels - overall doing well.     Past Medical History  Diagnosis Date  . Arthritis   . Depression   . Diverticulosis   . Hypertension   . Hypercholesterolemia   . Colon polyp   . Urinary incontinence   . Hypovolemia 12/10    acute renal failure  . Sleep apnea     very mild   sleep study 8 yrs ago does not use CPAP  . GERD (gastroesophageal reflux disease)   . Anemia   . Neuromuscular disorder ([place])     occasionally tingling in hands  . Glaucoma    Past Surgical History  Procedure Laterality Date  . Cholecystectomy  1996  . Abdominal hysterectomy  1979  . Laminectomy  1983 & 1985  . Rotator cuff repair  2000 & 2004    left and right  . Total knee arthroplasty  10/2011 & 09/13    left and right  . Tendon repair right shoulder    . Spinal cord stimulator implant  03/1997  . Bladder surgery  1981  . Foot surgery  2005  . Breast reduction surgery    . Carpal tunnel release  2007  . Spinal cord stimulator removal  2007  . Back surgery    . Anterior cervical decomp/discectomy fusion N/A 11/28/2014    Procedure: CERVICAL FIVE-SIX  ANTERIOR CERVICAL DECOMPRESSION/DISCECTOMY FUSION 1 LEVEL;  Surgeon: Newman Pies, MD;  Location: [facility];  Service: Neurosurgery;  Laterality: N/A;  C56 anterior cervical decompression with fusion interbody prosthesis plating and bonegraft  . Reduction mammaplasty Bilateral 2001   Family History  Problem Relation Age of Onset  . Heart disease Mother   . Stroke Mother   . Hypertension  Mother   . Hyperlipidemia Father   . Heart disease Father   . Heart disease Brother     2 brothers  . Alzheimer's disease Brother   . Heart disease Sister     pacemaker  . Breast cancer Daughter 25   Social History   Social History  . Marital Status: Widowed    Spouse Name: N/A  . Number of Children: 2  . Years of Education: N/A   Social History Main Topics  . Smoking status: Former Smoker -- 1.00 packs/day for 20 years    Types: Cigarettes    Quit date: 09/07/1988  . Smokeless tobacco: Never Used  . Alcohol Use: 0.0 oz/week    0 Standard drinks or equivalent per week     Comment: occasional  . Drug Use: No  . Sexual Activity: Not Asked   Other Topics Concern  . None   Social History Narrative   She is widowed, has two daughters.    Outpatient Encounter Prescriptions as of 02/04/2016  Medication Sig  . latanoprost (XALATAN) 0.005 % ophthalmic solution   . pantoprazole (PROTONIX) 40 MG tablet TAKE 1 TABLET TWICE A DAY  . pravastatin (PRAVACHOL) 40 MG tablet TAKE 1 TABLET  DAILY  . tolterodine (DETROL LA) 4 MG 24 hr capsule Take 1 capsule (4 mg total) by mouth daily.  . [DISCONTINUED] timolol (BETIMOL) 0.5 % ophthalmic solution Place 1 drop into both eyes daily.  . valsartan (DIOVAN) 80 MG tablet Take 1 tablet (80 mg total) by mouth daily.   No facility-administered encounter medications on file as of 02/04/2016.    Review of Systems  Constitutional: Negative for appetite change and unexpected weight change.  HENT: Negative for congestion and sinus pressure.   Respiratory: Negative for cough, chest tightness and shortness of breath.   Cardiovascular: Negative for chest pain, palpitations and leg swelling.  Gastrointestinal: Negative for nausea, vomiting, abdominal pain and diarrhea.  Genitourinary: Negative for dysuria and difficulty urinating.  Musculoskeletal: Negative for back pain and joint swelling.  Skin: Negative for color change and rash.  Neurological:  Negative for dizziness, light-headedness and headaches.  Psychiatric/Behavioral: Negative for dysphoric mood and agitation.       Objective:     Blood pressure rechecked by me:  148/82  Physical Exam  Constitutional: She appears well-developed and well-nourished. No distress.  HENT:  Nose: Nose normal.  Mouth/Throat: Oropharynx is clear and moist.  Neck: Neck supple. No thyromegaly present.  Cardiovascular: Normal rate and regular rhythm.   Pulmonary/Chest: Breath sounds normal. No respiratory distress. She has no wheezes.  Abdominal: Soft. Bowel sounds are normal. There is no tenderness.  Musculoskeletal: She exhibits no edema or tenderness.  Lymphadenopathy:    She has no cervical adenopathy.  Skin: No rash noted. No erythema.  Psychiatric: She has a normal mood and affect. Her behavior is normal.    BP 138/80 mmHg  Pulse 65  Temp(Src) 98 F (36.7 C) (Oral)  Resp 18  Ht 5\' 4"  (1.626 m)  Wt 164 lb (74.39 kg)  BMI 28.14 kg/m2  SpO2 95%  LMP 08/15/1978 Wt Readings from Last 3 Encounters:  02/04/16 164 lb (74.39 kg)  11/08/15 160 lb 8 oz (72.802 kg)  08/20/15 157 lb (71.215 kg)     Lab Results  Component Value Date   WBC 6.2 02/27/2015   HGB 13.0 02/27/2015   HCT 39.4 02/27/2015   PLT 294.0 02/27/2015   GLUCOSE 93 11/04/2015   CHOL 269* 11/04/2015   TRIG 137.0 11/04/2015   HDL 87.70 11/04/2015   LDLDIRECT 147.1 05/30/2013   LDLCALC 154* 11/04/2015   ALT 16 11/04/2015   AST 23 11/04/2015   NA 139 11/04/2015   K 4.4 11/04/2015   CL 103 11/04/2015   CREATININE 0.73 11/04/2015   BUN 19 11/04/2015   CO2 27 11/04/2015   TSH 1.53 11/04/2015   INR 1.0 10/16/2013    Mm Digital Screening Bilateral  11/28/2015  CLINICAL DATA:  Screening. EXAM: DIGITAL SCREENING BILATERAL MAMMOGRAM WITH CAD COMPARISON:  Previous exam(s). ACR Breast Density Category b: There are scattered areas of fibroglandular density. FINDINGS: There are no findings suspicious for malignancy.  Images were processed with CAD. IMPRESSION: No mammographic evidence of malignancy. A result letter of this screening mammogram will be mailed directly to the patient. RECOMMENDATION: Screening mammogram in one year. (Code:SM-B-01Y) BI-RADS CATEGORY  1: Negative. Electronically Signed   By: Pamelia Hoit M.D.   On: 11/28/2015 16:15       Assessment & Plan:   Problem List Items Addressed This Visit    Aortic aneurysm ([place])    Found to have ascending aortic aneurysm on CT.  Seeing cardiology.  Has appt with dr Nehemiah Massed soon.  Relevant Medications   valsartan (DIOVAN) 80 MG tablet   GERD (gastroesophageal reflux disease)    On protonix.  Upper symptoms controlled.        Glaucoma    Followed by opthalmology.       Hypercholesterolemia    On pravastatin.  Low cholesterol diet and exercise.  Follow lipid panel and liver function tests.        Relevant Medications   valsartan (DIOVAN) 80 MG tablet   Other Relevant Orders   Lipid panel   Hepatic function panel   Hypertension - Primary    Blood pressure elevated.  Restart diovan.  Follow pressures.  Follow metabolic panel.        Relevant Medications   valsartan (DIOVAN) 80 MG tablet   Other Relevant Orders   CBC with Differential/Platelet   TSH   Basic metabolic panel       Einar Pheasant, MD

## 2016-02-04 NOTE — Progress Notes (Signed)
Pre-visit discussion using our clinic review tool. No additional management support is needed unless otherwise documented below in the visit note.  

## 2016-02-06 DIAGNOSIS — H401131 Primary open-angle glaucoma, bilateral, mild stage: Secondary | ICD-10-CM | POA: Diagnosis not present

## 2016-02-09 ENCOUNTER — Encounter: Payer: Self-pay | Admitting: Internal Medicine

## 2016-02-09 NOTE — Assessment & Plan Note (Signed)
Blood pressure elevated.  Restart diovan.  Follow pressures.  Follow metabolic panel.

## 2016-02-09 NOTE — Assessment & Plan Note (Signed)
On pravastatin.  Low cholesterol diet and exercise.  Follow lipid panel and liver function tests.   

## 2016-02-09 NOTE — Assessment & Plan Note (Signed)
On protonix.  Upper symptoms controlled.  

## 2016-02-09 NOTE — Assessment & Plan Note (Signed)
Followed by opthalmology.       

## 2016-02-09 NOTE — Assessment & Plan Note (Signed)
Found to have ascending aortic aneurysm on CT.  Seeing cardiology.  Has appt with dr Nehemiah Massed soon.

## 2016-02-12 DIAGNOSIS — I2510 Atherosclerotic heart disease of native coronary artery without angina pectoris: Secondary | ICD-10-CM | POA: Diagnosis not present

## 2016-02-12 DIAGNOSIS — I712 Thoracic aortic aneurysm, without rupture: Secondary | ICD-10-CM | POA: Diagnosis not present

## 2016-02-12 DIAGNOSIS — E782 Mixed hyperlipidemia: Secondary | ICD-10-CM | POA: Diagnosis not present

## 2016-02-12 DIAGNOSIS — I10 Essential (primary) hypertension: Secondary | ICD-10-CM | POA: Diagnosis not present

## 2016-02-20 ENCOUNTER — Other Ambulatory Visit (INDEPENDENT_AMBULATORY_CARE_PROVIDER_SITE_OTHER): Payer: Medicare Other

## 2016-02-20 DIAGNOSIS — I10 Essential (primary) hypertension: Secondary | ICD-10-CM

## 2016-02-20 DIAGNOSIS — E7800 Pure hypercholesterolemia, unspecified: Secondary | ICD-10-CM | POA: Diagnosis not present

## 2016-02-20 LAB — CBC WITH DIFFERENTIAL/PLATELET
BASOS PCT: 1.2 % (ref 0.0–3.0)
Basophils Absolute: 0.1 10*3/uL (ref 0.0–0.1)
EOS ABS: 0.3 10*3/uL (ref 0.0–0.7)
EOS PCT: 4.9 % (ref 0.0–5.0)
HCT: 38.5 % (ref 36.0–46.0)
Hemoglobin: 13.1 g/dL (ref 12.0–15.0)
LYMPHS ABS: 2.8 10*3/uL (ref 0.7–4.0)
Lymphocytes Relative: 45.2 % (ref 12.0–46.0)
MCHC: 33.9 g/dL (ref 30.0–36.0)
MCV: 95.8 fl (ref 78.0–100.0)
MONO ABS: 0.4 10*3/uL (ref 0.1–1.0)
Monocytes Relative: 5.8 % (ref 3.0–12.0)
NEUTROS PCT: 42.9 % — AB (ref 43.0–77.0)
Neutro Abs: 2.6 10*3/uL (ref 1.4–7.7)
Platelets: 306 10*3/uL (ref 150.0–400.0)
RBC: 4.02 Mil/uL (ref 3.87–5.11)
RDW: 13.5 % (ref 11.5–15.5)
WBC: 6.1 10*3/uL (ref 4.0–10.5)

## 2016-02-20 LAB — HEPATIC FUNCTION PANEL
ALBUMIN: 4.7 g/dL (ref 3.5–5.2)
ALT: 18 U/L (ref 0–35)
AST: 21 U/L (ref 0–37)
Alkaline Phosphatase: 55 U/L (ref 39–117)
Bilirubin, Direct: 0.1 mg/dL (ref 0.0–0.3)
TOTAL PROTEIN: 7.2 g/dL (ref 6.0–8.3)
Total Bilirubin: 0.5 mg/dL (ref 0.2–1.2)

## 2016-02-20 LAB — BASIC METABOLIC PANEL
BUN: 23 mg/dL (ref 6–23)
CHLORIDE: 105 meq/L (ref 96–112)
CO2: 28 mEq/L (ref 19–32)
Calcium: 9.8 mg/dL (ref 8.4–10.5)
Creatinine, Ser: 0.81 mg/dL (ref 0.40–1.20)
GFR: 73.02 mL/min (ref >60.00)
Glucose, Bld: 101 mg/dL — ABNORMAL HIGH (ref 70–99)
POTASSIUM: 4.5 meq/L (ref 3.5–5.1)
SODIUM: 140 meq/L (ref 135–145)

## 2016-02-20 LAB — LIPID PANEL
CHOL/HDL RATIO: 3
CHOLESTEROL: 244 mg/dL — AB (ref 0–200)
HDL: 94.7 mg/dL (ref >39.00)
LDL Cholesterol: 128 mg/dL — ABNORMAL HIGH (ref 0–99)
NonHDL: 149.7
Triglycerides: 110 mg/dL (ref 0.0–149.0)
VLDL: 22 mg/dL (ref 0.0–40.0)

## 2016-02-20 LAB — TSH: TSH: 2 u[IU]/mL (ref 0.35–4.50)

## 2016-02-21 ENCOUNTER — Encounter: Payer: Self-pay | Admitting: Internal Medicine

## 2016-03-06 DIAGNOSIS — I6523 Occlusion and stenosis of bilateral carotid arteries: Secondary | ICD-10-CM | POA: Diagnosis not present

## 2016-03-06 DIAGNOSIS — I712 Thoracic aortic aneurysm, without rupture: Secondary | ICD-10-CM | POA: Diagnosis not present

## 2016-03-06 DIAGNOSIS — I2510 Atherosclerotic heart disease of native coronary artery without angina pectoris: Secondary | ICD-10-CM | POA: Diagnosis not present

## 2016-03-06 DIAGNOSIS — I714 Abdominal aortic aneurysm, without rupture: Secondary | ICD-10-CM | POA: Diagnosis not present

## 2016-03-06 DIAGNOSIS — I5189 Other ill-defined heart diseases: Secondary | ICD-10-CM

## 2016-03-06 DIAGNOSIS — I272 Pulmonary hypertension, unspecified: Secondary | ICD-10-CM

## 2016-03-06 HISTORY — DX: Other ill-defined heart diseases: I51.89

## 2016-03-06 HISTORY — DX: Pulmonary hypertension, unspecified: I27.20

## 2016-03-08 ENCOUNTER — Other Ambulatory Visit: Payer: Self-pay | Admitting: Internal Medicine

## 2016-03-17 DIAGNOSIS — I10 Essential (primary) hypertension: Secondary | ICD-10-CM | POA: Diagnosis not present

## 2016-03-17 DIAGNOSIS — I6523 Occlusion and stenosis of bilateral carotid arteries: Secondary | ICD-10-CM | POA: Diagnosis not present

## 2016-03-17 DIAGNOSIS — E782 Mixed hyperlipidemia: Secondary | ICD-10-CM | POA: Diagnosis not present

## 2016-03-17 DIAGNOSIS — I712 Thoracic aortic aneurysm, without rupture: Secondary | ICD-10-CM | POA: Diagnosis not present

## 2016-03-20 DIAGNOSIS — H401131 Primary open-angle glaucoma, bilateral, mild stage: Secondary | ICD-10-CM | POA: Diagnosis not present

## 2016-04-15 ENCOUNTER — Other Ambulatory Visit: Payer: Self-pay | Admitting: Internal Medicine

## 2016-04-20 ENCOUNTER — Encounter: Payer: Self-pay | Admitting: Internal Medicine

## 2016-04-20 ENCOUNTER — Ambulatory Visit (INDEPENDENT_AMBULATORY_CARE_PROVIDER_SITE_OTHER): Payer: Medicare Other | Admitting: Internal Medicine

## 2016-04-20 VITALS — BP 130/90 | HR 79 | Temp 98.0°F | Resp 18 | Ht 64.0 in | Wt 161.2 lb

## 2016-04-20 DIAGNOSIS — I712 Thoracic aortic aneurysm, without rupture, unspecified: Secondary | ICD-10-CM

## 2016-04-20 DIAGNOSIS — I10 Essential (primary) hypertension: Secondary | ICD-10-CM

## 2016-04-20 DIAGNOSIS — K219 Gastro-esophageal reflux disease without esophagitis: Secondary | ICD-10-CM

## 2016-04-20 DIAGNOSIS — H409 Unspecified glaucoma: Secondary | ICD-10-CM | POA: Diagnosis not present

## 2016-04-20 DIAGNOSIS — E7800 Pure hypercholesterolemia, unspecified: Secondary | ICD-10-CM | POA: Diagnosis not present

## 2016-04-20 MED ORDER — VALSARTAN 80 MG PO TABS: ORAL_TABLET | ORAL | 1 refills | Status: DC

## 2016-04-20 MED ORDER — ROSUVASTATIN CALCIUM 20 MG PO TABS: 20.0000 mg | ORAL_TABLET | Freq: Every day | ORAL | 1 refills | Status: DC

## 2016-04-20 NOTE — Progress Notes (Signed)
Pre-visit discussion using our clinic review tool. No additional management support is needed unless otherwise documented below in the visit note.  

## 2016-04-20 NOTE — Progress Notes (Signed)
Patient ID: Leah Huber, female   DOB: 06/19/1941, 75 y.o.   MRN: [id number]   Subjective:    Patient ID: Leah Huber, female    DOB: 07-18-1941, 75 y.o.   MRN: [id number]  HPI  Patient here for a scheduled follow up.  Being followed by opthalmology for elevated pressure.  Sees Dr Wallace Going.  Had drops increased.  He is following her pressure.  She states her blood pressures on outside checks are averaging 120-127/<80.  She was evaluated by Dr Nehemiah Massed - for f/u aneurysm.  Aneurysm stable.  Carotid <50%.  Discussed labs.  Discussed cholesterol. Will stop pravastatin.  Start cholesterol.  She is agreeable.  No chest pain.  No sob.  No acid reflux.  No abdominal pain or cramping.  Bowels stable.  Does not drink alone.  May have a drink with a meal.     Past Medical History:  Diagnosis Date  . Anemia   . Arthritis   . Colon polyp   . Depression   . Diverticulosis   . GERD (gastroesophageal reflux disease)   . Glaucoma   . Hypercholesterolemia   . Hypertension   . Hypovolemia 12/10   acute renal failure  . Neuromuscular disorder ([place])    occasionally tingling in hands  . Sleep apnea    very mild   sleep study 8 yrs ago does not use CPAP  . Urinary incontinence    Past Surgical History:  Procedure Laterality Date  . ABDOMINAL HYSTERECTOMY  1979  . ANTERIOR CERVICAL DECOMP/DISCECTOMY FUSION N/A 11/28/2014   Procedure: CERVICAL FIVE-SIX  ANTERIOR CERVICAL DECOMPRESSION/DISCECTOMY FUSION 1 LEVEL;  Surgeon: Newman Pies, MD;  Location: [facility];  Service: Neurosurgery;  Laterality: N/A;  C56 anterior cervical decompression with fusion interbody prosthesis plating and bonegraft  . BACK SURGERY    . BLADDER SURGERY  1981  . BREAST REDUCTION SURGERY    . CARPAL TUNNEL RELEASE  2007  . CHOLECYSTECTOMY  1996  . FOOT SURGERY  2005  . [place]  . REDUCTION MAMMAPLASTY Bilateral 2001  . [place] & 2004   left and right  . SPINAL CORD STIMULATOR  IMPLANT  03/1997  . SPINAL CORD STIMULATOR REMOVAL  2007  . Tendon repair right shoulder    . TOTAL KNEE ARTHROPLASTY  10/2011 & 09/13   left and right   Family History  Problem Relation Age of Onset  . Heart disease Mother   . Stroke Mother   . Hypertension Mother   . Hyperlipidemia Father   . Heart disease Father   . Heart disease Brother     2 brothers  . Alzheimer's disease Brother   . Heart disease Sister     pacemaker  . Breast cancer Daughter 79   Social History   Social History  . Marital status: Widowed    Spouse name: N/A  . Number of children: 2  . Years of education: N/A   Social History Main Topics  . Smoking status: Former Smoker    Packs/day: 1.00    Years: 20.00    Types: Cigarettes    Quit date: 09/07/1988  . Smokeless tobacco: Never Used  . Alcohol use 0.0 oz/week     Comment: occasional  . Drug use: No  . Sexual activity: Not Asked   Other Topics Concern  . None   Social History Narrative   She is widowed, has two daughters.    Outpatient Encounter  Prescriptions as of 04/20/2016  Medication Sig  . brimonidine (ALPHAGAN) 0.2 % ophthalmic solution Apply 1 drop to eye 2 (two) times daily.  Marland Kitchen latanoprost (XALATAN) 0.005 % ophthalmic solution Place 1 drop into both eyes at bedtime.   . tolterodine (DETROL LA) 4 MG 24 hr capsule Take 1 capsule (4 mg total) by mouth daily.  . valsartan (DIOVAN) 80 MG tablet TAKE 1 TABLET(80 MG) BY MOUTH DAILY  . [DISCONTINUED] pantoprazole (PROTONIX) 40 MG tablet TAKE 1 TABLET TWICE A DAY  . [DISCONTINUED] pravastatin (PRAVACHOL) 40 MG tablet TAKE 1 TABLET DAILY  . [DISCONTINUED] valsartan (DIOVAN) 80 MG tablet TAKE 1 TABLET(80 MG) BY MOUTH DAILY  . pantoprazole (PROTONIX) 40 MG tablet Take 40 mg by mouth daily.  . rosuvastatin (CRESTOR) 20 MG tablet Take 1 tablet (20 mg total) by mouth daily.   No facility-administered encounter medications on file as of 04/20/2016.     Review of Systems  Constitutional: Negative  for appetite change and unexpected weight change.  HENT: Negative for congestion and sinus pressure.   Respiratory: Negative for cough, chest tightness and shortness of breath.   Cardiovascular: Negative for chest pain, palpitations and leg swelling.  Gastrointestinal: Negative for abdominal pain, diarrhea, nausea and vomiting.  Genitourinary: Negative for difficulty urinating and dysuria.  Musculoskeletal: Negative for joint swelling and myalgias.  Skin: Negative for color change and rash.  Neurological: Negative for dizziness, light-headedness and headaches.  Psychiatric/Behavioral: Negative for agitation and dysphoric mood.       Objective:    Physical Exam  Constitutional: She appears well-developed and well-nourished. No distress.  HENT:  Nose: Nose normal.  Mouth/Throat: Oropharynx is clear and moist.  Neck: Neck supple. No thyromegaly present.  Cardiovascular: Normal rate and regular rhythm.   Pulmonary/Chest: Breath sounds normal. No respiratory distress. She has no wheezes.  Abdominal: Soft. Bowel sounds are normal. There is no tenderness.  Musculoskeletal: She exhibits no edema or tenderness.  Lymphadenopathy:    She has no cervical adenopathy.  Skin: No rash noted. No erythema.  Psychiatric: She has a normal mood and affect. Her behavior is normal.    BP 130/90   Pulse 79   Temp 98 F (36.7 C) (Oral)   Resp 18   Ht 5\' 4"  (1.626 m)   Wt 161 lb 4 oz (73.1 kg)   LMP 08/15/1978   SpO2 96%   BMI 27.68 kg/m  Wt Readings from Last 3 Encounters:  04/20/16 161 lb 4 oz (73.1 kg)  02/04/16 164 lb (74.4 kg)  11/08/15 160 lb 8 oz (72.8 kg)     Lab Results  Component Value Date   WBC 6.1 02/20/2016   HGB 13.1 02/20/2016   HCT 38.5 02/20/2016   PLT 306.0 02/20/2016   GLUCOSE 101 (H) 02/20/2016   CHOL 244 (H) 02/20/2016   TRIG 110.0 02/20/2016   HDL 94.70 02/20/2016   LDLDIRECT 147.1 05/30/2013   LDLCALC 128 (H) 02/20/2016   ALT 18 02/20/2016   AST 21  02/20/2016   NA 140 02/20/2016   K 4.5 02/20/2016   CL 105 02/20/2016   CREATININE 0.81 02/20/2016   BUN 23 02/20/2016   CO2 28 02/20/2016   TSH 2.00 02/20/2016   INR 1.0 10/16/2013    Mm Digital Screening Bilateral  Result Date: 11/28/2015 CLINICAL DATA:  Screening. EXAM: DIGITAL SCREENING BILATERAL MAMMOGRAM WITH CAD COMPARISON:  Previous exam(s). ACR Breast Density Category b: There are scattered areas of fibroglandular density. FINDINGS: There are no findings  suspicious for malignancy. Images were processed with CAD. IMPRESSION: No mammographic evidence of malignancy. A result letter of this screening mammogram will be mailed directly to the patient. RECOMMENDATION: Screening mammogram in one year. (Code:SM-B-01Y) BI-RADS CATEGORY  1: Negative. Electronically Signed   By: Pamelia Hoit M.D.   On: 11/28/2015 16:15       Assessment & Plan:   Problem List Items Addressed This Visit    Aortic aneurysm ([place])    Found to have ascending aortic aneurysm on CT.  Followed by cardiology.  Just evaluated.  Stable.  Follow.       Relevant Medications   rosuvastatin (CRESTOR) 20 MG tablet   valsartan (DIOVAN) 80 MG tablet   GERD (gastroesophageal reflux disease)    Upper symptoms controlled on protonix.  Follow.        Relevant Medications   pantoprazole (PROTONIX) 40 MG tablet   Glaucoma    Followed by opthalmology.        Relevant Medications   brimonidine (ALPHAGAN) 0.2 % ophthalmic solution   Hypercholesterolemia - Primary    Has been on pravastatin.  LDL increased above goal.  Change to crestor 20mg  q day.  Follow.        Relevant Medications   rosuvastatin (CRESTOR) 20 MG tablet   valsartan (DIOVAN) 80 MG tablet   Other Relevant Orders   Hepatic function panel   Hypertension    Back on diovan.  On recheck blood pressure improved.  Her checks ok.  Continue to follow pressures.  Follow metabolic panel.        Relevant Medications   rosuvastatin (CRESTOR) 20 MG tablet    valsartan (DIOVAN) 80 MG tablet    Other Visit Diagnoses   None.      Einar Pheasant, MD

## 2016-04-27 ENCOUNTER — Encounter: Payer: Self-pay | Admitting: Internal Medicine

## 2016-04-27 NOTE — Assessment & Plan Note (Signed)
Followed by opthalmology.       

## 2016-04-27 NOTE — Assessment & Plan Note (Signed)
Found to have ascending aortic aneurysm on CT.  Followed by cardiology.  Just evaluated.  Stable.  Follow.

## 2016-04-27 NOTE — Assessment & Plan Note (Signed)
Has been on pravastatin.  LDL increased above goal.  Change to crestor 20mg  q day.  Follow.

## 2016-04-27 NOTE — Assessment & Plan Note (Signed)
Upper symptoms controlled on protonix.  Follow.  

## 2016-04-27 NOTE — Assessment & Plan Note (Signed)
Back on diovan.  On recheck blood pressure improved.  Her checks ok.  Continue to follow pressures.  Follow metabolic panel.

## 2016-05-26 DIAGNOSIS — H401131 Primary open-angle glaucoma, bilateral, mild stage: Secondary | ICD-10-CM | POA: Diagnosis not present

## 2016-06-01 ENCOUNTER — Other Ambulatory Visit (INDEPENDENT_AMBULATORY_CARE_PROVIDER_SITE_OTHER): Payer: Medicare Other

## 2016-06-01 DIAGNOSIS — E7800 Pure hypercholesterolemia, unspecified: Secondary | ICD-10-CM

## 2016-06-01 LAB — HEPATIC FUNCTION PANEL
ALBUMIN: 4.4 g/dL (ref 3.5–5.2)
ALK PHOS: 54 U/L (ref 39–117)
ALT: 22 U/L (ref 0–35)
AST: 27 U/L (ref 0–37)
BILIRUBIN DIRECT: 0 mg/dL (ref 0.0–0.3)
TOTAL PROTEIN: 7.1 g/dL (ref 6.0–8.3)
Total Bilirubin: 0.3 mg/dL (ref 0.2–1.2)

## 2016-06-02 ENCOUNTER — Encounter: Payer: Self-pay | Admitting: Internal Medicine

## 2016-06-09 DIAGNOSIS — H401131 Primary open-angle glaucoma, bilateral, mild stage: Secondary | ICD-10-CM | POA: Diagnosis not present

## 2016-06-30 DIAGNOSIS — H401131 Primary open-angle glaucoma, bilateral, mild stage: Secondary | ICD-10-CM | POA: Diagnosis not present

## 2016-07-07 ENCOUNTER — Telehealth: Payer: Self-pay | Admitting: Internal Medicine

## 2016-07-07 NOTE — Telephone Encounter (Signed)
I called pt and left a vm to call office to sch AWV. Thank you! °

## 2016-07-27 ENCOUNTER — Encounter: Payer: Self-pay | Admitting: Internal Medicine

## 2016-07-27 ENCOUNTER — Ambulatory Visit (INDEPENDENT_AMBULATORY_CARE_PROVIDER_SITE_OTHER): Payer: Medicare Other | Admitting: Internal Medicine

## 2016-07-27 VITALS — BP 134/78 | HR 77 | Temp 98.4°F | Ht 64.0 in | Wt 164.8 lb

## 2016-07-27 DIAGNOSIS — Z23 Encounter for immunization: Secondary | ICD-10-CM

## 2016-07-27 DIAGNOSIS — F439 Reaction to severe stress, unspecified: Secondary | ICD-10-CM

## 2016-07-27 DIAGNOSIS — I712 Thoracic aortic aneurysm, without rupture, unspecified: Secondary | ICD-10-CM

## 2016-07-27 DIAGNOSIS — E7800 Pure hypercholesterolemia, unspecified: Secondary | ICD-10-CM | POA: Diagnosis not present

## 2016-07-27 DIAGNOSIS — I10 Essential (primary) hypertension: Secondary | ICD-10-CM

## 2016-07-27 DIAGNOSIS — K219 Gastro-esophageal reflux disease without esophagitis: Secondary | ICD-10-CM

## 2016-07-27 LAB — BASIC METABOLIC PANEL
BUN: 21 mg/dL (ref 6–23)
CHLORIDE: 103 meq/L (ref 96–112)
CO2: 29 meq/L (ref 19–32)
CREATININE: 0.85 mg/dL (ref 0.40–1.20)
Calcium: 10 mg/dL (ref 8.4–10.5)
GFR: 68.99 mL/min (ref >60.00)
GLUCOSE: 105 mg/dL — AB (ref 70–99)
Potassium: 5 mEq/L (ref 3.5–5.1)
Sodium: 140 mEq/L (ref 135–145)

## 2016-07-27 LAB — HEPATIC FUNCTION PANEL
ALK PHOS: 48 U/L (ref 39–117)
ALT: 16 U/L (ref 0–35)
AST: 22 U/L (ref 0–37)
Albumin: 4.6 g/dL (ref 3.5–5.2)
BILIRUBIN DIRECT: 0 mg/dL (ref 0.0–0.3)
BILIRUBIN TOTAL: 0.5 mg/dL (ref 0.2–1.2)
TOTAL PROTEIN: 7.2 g/dL (ref 6.0–8.3)

## 2016-07-27 LAB — LIPID PANEL
CHOL/HDL RATIO: 2
Cholesterol: 188 mg/dL (ref 0–200)
HDL: 101.5 mg/dL (ref >39.00)
LDL Cholesterol: 62 mg/dL (ref 0–99)
NONHDL: 86.3
TRIGLYCERIDES: 122 mg/dL (ref 0.0–149.0)
VLDL: 24.4 mg/dL (ref 0.0–40.0)

## 2016-07-27 MED ORDER — ESCITALOPRAM OXALATE 10 MG PO TABS: 10.0000 mg | ORAL_TABLET | Freq: Every day | ORAL | 1 refills | Status: DC

## 2016-07-27 NOTE — Assessment & Plan Note (Signed)
On crestor now.  Tolerating.  Check lipid panel and liver function tests today.

## 2016-07-27 NOTE — Progress Notes (Signed)
Patient ID: Leah Huber, female   DOB: [birth year], 75 y.o.   MRN: [id number]   Subjective:    Patient ID: Leah Huber, female    DOB: 1941/02/28, 75 y.o.   MRN: [id number]  HPI  Patient here for a scheduled follow up.  She reports she is doing relatively well.  Increased stress.  Anniversary of her husband's death.  Other stress related issues.  Discussed with her today.  Discussed counseling.  She does not feel she needs anything more at this time.  She is on crestor. Discussed diet and exercise.  States her blood pressure on her checks have been doing better.  Highest 124/80.  Some left posterior shoulder pain at times.  Resolves with one advil. No chest pain.  No persistent shoulder pain.  No sob.  No acid reflux.  No abdominal pain or cramping.  Bowels stable.     Past Medical History:  Diagnosis Date  . Anemia   . Arthritis   . Colon polyp   . Depression   . Diverticulosis   . GERD (gastroesophageal reflux disease)   . Glaucoma   . Hypercholesterolemia   . Hypertension   . Hypovolemia 12/10   acute renal failure  . Neuromuscular disorder ([place])    occasionally tingling in hands  . Sleep apnea    very mild   sleep study 8 yrs ago does not use CPAP  . Urinary incontinence    Past Surgical History:  Procedure Laterality Date  . ABDOMINAL HYSTERECTOMY  1979  . ANTERIOR CERVICAL DECOMP/DISCECTOMY FUSION N/A 11/28/2014   Procedure: CERVICAL FIVE-SIX  ANTERIOR CERVICAL DECOMPRESSION/DISCECTOMY FUSION 1 LEVEL;  Surgeon: Newman Pies, MD;  Location: [facility];  Service: Neurosurgery;  Laterality: N/A;  C56 anterior cervical decompression with fusion interbody prosthesis plating and bonegraft  . BACK SURGERY    . BLADDER SURGERY  1981  . BREAST REDUCTION SURGERY    . CARPAL TUNNEL RELEASE  2007  . CHOLECYSTECTOMY  1996  . FOOT SURGERY  2005  . [place]  . REDUCTION MAMMAPLASTY Bilateral 2001  . [place] & 2004   left and right  .  SPINAL CORD STIMULATOR IMPLANT  03/1997  . SPINAL CORD STIMULATOR REMOVAL  2007  . Tendon repair right shoulder    . TOTAL KNEE ARTHROPLASTY  10/2011 & 09/13   left and right   Family History  Problem Relation Age of Onset  . Heart disease Mother   . Stroke Mother   . Hypertension Mother   . Hyperlipidemia Father   . Heart disease Father   . Heart disease Brother     2 brothers  . Alzheimer's disease Brother   . Heart disease Sister     pacemaker  . Breast cancer Daughter 30   Social History   Social History  . Marital status: Widowed    Spouse name: N/A  . Number of children: 2  . Years of education: N/A   Social History Main Topics  . Smoking status: Former Smoker    Packs/day: 1.00    Years: 20.00    Types: Cigarettes    Quit date: 09/07/1988  . Smokeless tobacco: Never Used  . Alcohol use 0.0 oz/week     Comment: occasional  . Drug use: No  . Sexual activity: Not Asked   Other Topics Concern  . None   Social History Narrative   She is widowed, has two daughters.  Outpatient Encounter Prescriptions as of 07/27/2016  Medication Sig  . latanoprost (XALATAN) 0.005 % ophthalmic solution Place 1 drop into both eyes at bedtime.   . pantoprazole (PROTONIX) 40 MG tablet Take 40 mg by mouth daily.  . rosuvastatin (CRESTOR) 20 MG tablet Take 1 tablet (20 mg total) by mouth daily.  Marland Kitchen tolterodine (DETROL LA) 4 MG 24 hr capsule Take 1 capsule (4 mg total) by mouth daily.  . valsartan (DIOVAN) 80 MG tablet TAKE 1 TABLET(80 MG) BY MOUTH DAILY  . [DISCONTINUED] brimonidine (ALPHAGAN) 0.2 % ophthalmic solution Apply 1 drop to eye 2 (two) times daily.  Marland Kitchen escitalopram (LEXAPRO) 10 MG tablet Take 1 tablet (10 mg total) by mouth daily.   No facility-administered encounter medications on file as of 07/27/2016.     Review of Systems  Constitutional: Negative for appetite change and unexpected weight change.  HENT: Negative for congestion and sinus pressure.   Respiratory:  Negative for cough, chest tightness and shortness of breath.   Cardiovascular: Negative for chest pain, palpitations and leg swelling.  Gastrointestinal: Negative for abdominal pain, diarrhea, nausea and vomiting.  Genitourinary: Negative for difficulty urinating and dysuria.  Musculoskeletal: Negative for joint swelling and myalgias.  Skin: Negative for color change and rash.  Neurological: Negative for dizziness, light-headedness and headaches.  Psychiatric/Behavioral: Negative for agitation and dysphoric mood.       Objective:     Blood pressure rechecked by me:  134/78  Physical Exam  Constitutional: She appears well-developed and well-nourished. No distress.  HENT:  Nose: Nose normal.  Mouth/Throat: Oropharynx is clear and moist.  Neck: Neck supple. No thyromegaly present.  Cardiovascular: Normal rate and regular rhythm.   Pulmonary/Chest: Breath sounds normal. No respiratory distress. She has no wheezes.  Abdominal: Soft. Bowel sounds are normal. There is no tenderness.  Musculoskeletal: She exhibits no edema or tenderness.  Lymphadenopathy:    She has no cervical adenopathy.  Skin: No rash noted. No erythema.  Psychiatric: She has a normal mood and affect. Her behavior is normal.    BP 134/78   Pulse 77   Temp 98.4 F (36.9 C) (Oral)   Ht 5\' 4"  (1.626 m)   Wt 164 lb 12.8 oz (74.8 kg)   LMP 08/15/1978   SpO2 96%   BMI 28.29 kg/m  Wt Readings from Last 3 Encounters:  07/27/16 164 lb 12.8 oz (74.8 kg)  04/20/16 161 lb 4 oz (73.1 kg)  02/04/16 164 lb (74.4 kg)     Lab Results  Component Value Date   WBC 6.1 02/20/2016   HGB 13.1 02/20/2016   HCT 38.5 02/20/2016   PLT 306.0 02/20/2016   GLUCOSE 105 (H) 07/27/2016   CHOL 188 07/27/2016   TRIG 122.0 07/27/2016   HDL 101.50 07/27/2016   LDLDIRECT 147.1 05/30/2013   LDLCALC 62 07/27/2016   ALT 16 07/27/2016   AST 22 07/27/2016   NA 140 07/27/2016   K 5.0 07/27/2016   CL 103 07/27/2016   CREATININE 0.85  07/27/2016   BUN 21 07/27/2016   CO2 29 07/27/2016   TSH 2.00 02/20/2016   INR 1.0 10/16/2013    Mm Digital Screening Bilateral  Result Date: 11/28/2015 CLINICAL DATA:  Screening. EXAM: DIGITAL SCREENING BILATERAL MAMMOGRAM WITH CAD COMPARISON:  Previous exam(s). ACR Breast Density Category b: There are scattered areas of fibroglandular density. FINDINGS: There are no findings suspicious for malignancy. Images were processed with CAD. IMPRESSION: No mammographic evidence of malignancy. A result letter of this  screening mammogram will be mailed directly to the patient. RECOMMENDATION: Screening mammogram in one year. (Code:SM-B-01Y) BI-RADS CATEGORY  1: Negative. Electronically Signed   By: Pamelia Hoit M.D.   On: 11/28/2015 16:15       Assessment & Plan:   Problem List Items Addressed This Visit    Aortic aneurysm ([place])    Found to have ascending aortic aneurysm on CT.  Followed by cardiology.  Stable.        GERD (gastroesophageal reflux disease)    Symptoms controlled on protonix.  Follow.       Hypercholesterolemia - Primary    On crestor now.  Tolerating.  Check lipid panel and liver function tests today.        Relevant Orders   Lipid panel (Completed)   Hepatic function panel (Completed)   Hypertension    Blood pressure on recheck improved.  Will continue diovan as she is doing.  Follow pressures.  Check metabolic panel.        Relevant Orders   Basic metabolic panel (Completed)   Stress    Increased stress as outlined.  Discussed with her today.  Feels she needs to restart lexapro.  Restart 10mg  lexapro.  Follow.  Avoid alcohol.  Get her back in soon to reassess.         Other Visit Diagnoses    Encounter for immunization       Relevant Orders   Flu vaccine HIGH DOSE PF (Completed)       Einar Pheasant, MD

## 2016-07-27 NOTE — Assessment & Plan Note (Signed)
Blood pressure on recheck improved.  Will continue diovan as she is doing.  Follow pressures.  Check metabolic panel.

## 2016-07-27 NOTE — Progress Notes (Signed)
Pre visit review using our clinic review tool, if applicable. No additional management support is needed unless otherwise documented below in the visit note. 

## 2016-07-28 DIAGNOSIS — H401131 Primary open-angle glaucoma, bilateral, mild stage: Secondary | ICD-10-CM | POA: Diagnosis not present

## 2016-07-29 ENCOUNTER — Encounter: Payer: Self-pay | Admitting: Internal Medicine

## 2016-08-02 ENCOUNTER — Encounter: Payer: Self-pay | Admitting: Internal Medicine

## 2016-08-02 NOTE — Assessment & Plan Note (Signed)
Found to have ascending aortic aneurysm on CT.  Followed by cardiology.  Stable.

## 2016-08-02 NOTE — Assessment & Plan Note (Signed)
Increased stress as outlined.  Discussed with her today.  Feels she needs to restart lexapro.  Restart 10mg  lexapro.  Follow.  Avoid alcohol.  Get her back in soon to reassess.

## 2016-08-02 NOTE — Assessment & Plan Note (Signed)
Symptoms controlled on protonix.  Follow.   

## 2016-09-03 ENCOUNTER — Ambulatory Visit: Payer: Medicare Other

## 2016-09-18 ENCOUNTER — Ambulatory Visit: Payer: Medicare Other

## 2016-09-18 ENCOUNTER — Ambulatory Visit (INDEPENDENT_AMBULATORY_CARE_PROVIDER_SITE_OTHER): Payer: Medicare Other | Admitting: Internal Medicine

## 2016-09-18 ENCOUNTER — Encounter: Payer: Self-pay | Admitting: Internal Medicine

## 2016-09-18 VITALS — BP 134/86 | HR 77 | Temp 98.2°F | Ht 64.0 in | Wt 164.6 lb

## 2016-09-18 DIAGNOSIS — Z1239 Encounter for other screening for malignant neoplasm of breast: Secondary | ICD-10-CM

## 2016-09-18 DIAGNOSIS — E2839 Other primary ovarian failure: Secondary | ICD-10-CM

## 2016-09-18 DIAGNOSIS — J069 Acute upper respiratory infection, unspecified: Secondary | ICD-10-CM | POA: Diagnosis not present

## 2016-09-18 DIAGNOSIS — I10 Essential (primary) hypertension: Secondary | ICD-10-CM | POA: Diagnosis not present

## 2016-09-18 DIAGNOSIS — F439 Reaction to severe stress, unspecified: Secondary | ICD-10-CM | POA: Diagnosis not present

## 2016-09-18 DIAGNOSIS — Z0000 Encounter for general adult medical examination without abnormal findings: Secondary | ICD-10-CM

## 2016-09-18 DIAGNOSIS — I712 Thoracic aortic aneurysm, without rupture, unspecified: Secondary | ICD-10-CM

## 2016-09-18 DIAGNOSIS — E7800 Pure hypercholesterolemia, unspecified: Secondary | ICD-10-CM | POA: Diagnosis not present

## 2016-09-18 DIAGNOSIS — Z1231 Encounter for screening mammogram for malignant neoplasm of breast: Secondary | ICD-10-CM | POA: Diagnosis not present

## 2016-09-18 DIAGNOSIS — K219 Gastro-esophageal reflux disease without esophagitis: Secondary | ICD-10-CM

## 2016-09-18 MED ORDER — AZITHROMYCIN 250 MG PO TABS: ORAL_TABLET | ORAL | 0 refills | Status: DC

## 2016-09-18 MED ORDER — PREDNISONE 10 MG PO TABS: ORAL_TABLET | ORAL | 0 refills | Status: DC

## 2016-09-18 NOTE — Progress Notes (Addendum)
Subjective:   Leah Huber is a 76 y.o. female who presents for an Initial Medicare Annual Wellness Visit.  Review of Systems    No ROS.  Medicare Wellness Visit.  Cardiac Risk Factors include: advanced age (>73men, >22 women);hypertension     Objective:    Today's Vitals   09/18/16 1005  BP: 134/86  Pulse: 77  Temp: 98.2 F (36.8 C)  TempSrc: Oral  SpO2: 98%  Weight: 164 lb 9.6 oz (74.7 kg)  Height: 5\' 4"  (1.626 m)   Body mass index is 28.25 kg/m.   Current Medications (verified) Outpatient Encounter Prescriptions as of 09/18/2016  Medication Sig  . escitalopram (LEXAPRO) 10 MG tablet Take 1 tablet (10 mg total) by mouth daily.  Marland Kitchen latanoprost (XALATAN) 0.005 % ophthalmic solution Place 1 drop into both eyes at bedtime.   . pantoprazole (PROTONIX) 40 MG tablet Take 40 mg by mouth daily.  . rosuvastatin (CRESTOR) 20 MG tablet Take 1 tablet (20 mg total) by mouth daily.  Marland Kitchen tolterodine (DETROL LA) 4 MG 24 hr capsule Take 1 capsule (4 mg total) by mouth daily.  . valsartan (DIOVAN) 80 MG tablet TAKE 1 TABLET(80 MG) BY MOUTH DAILY  . azithromycin (ZITHROMAX) 250 MG tablet Take 2 tablets x 1 day and then 1 tablet per day for four more days.  . predniSONE (DELTASONE) 10 MG tablet Take 4 tablets x 1 day and then decrease by 1/2 tablet per day until down to zero mg.   No facility-administered encounter medications on file as of 09/18/2016.     Allergies (verified) Clindamycin/lincomycin; Bactrim [sulfamethoxazole-trimethoprim]; Ciprofloxacin; Morphine; and Sulfa antibiotics   History: Past Medical History:  Diagnosis Date  . Anemia   . Arthritis   . Colon polyp   . Depression   . Diverticulosis   . GERD (gastroesophageal reflux disease)   . Glaucoma   . Hypercholesterolemia   . Hypertension   . Hypovolemia 12/10   acute renal failure  . Neuromuscular disorder ([place])    occasionally tingling in hands  . Sleep apnea    very mild   sleep study 8 yrs ago does not  use CPAP  . Urinary incontinence    Past Surgical History:  Procedure Laterality Date  . ABDOMINAL HYSTERECTOMY  1979  . ANTERIOR CERVICAL DECOMP/DISCECTOMY FUSION N/A 11/28/2014   Procedure: CERVICAL FIVE-SIX  ANTERIOR CERVICAL DECOMPRESSION/DISCECTOMY FUSION 1 LEVEL;  Surgeon: Newman Pies, MD;  Location: [facility];  Service: Neurosurgery;  Laterality: N/A;  C56 anterior cervical decompression with fusion interbody prosthesis plating and bonegraft  . BACK SURGERY    . BLADDER SURGERY  1981  . BREAST REDUCTION SURGERY    . CARPAL TUNNEL RELEASE  2007  . CHOLECYSTECTOMY  1996  . FOOT SURGERY  2005  . [place]  . REDUCTION MAMMAPLASTY Bilateral 2001  . [place] & 2004   left and right  . SPINAL CORD STIMULATOR IMPLANT  03/1997  . SPINAL CORD STIMULATOR REMOVAL  2007  . Tendon repair right shoulder    . TOTAL KNEE ARTHROPLASTY  10/2011 & 09/13   left and right   Family History  Problem Relation Age of Onset  . Heart disease Mother   . Stroke Mother   . Hypertension Mother   . Hyperlipidemia Father   . Heart disease Father   . Heart disease Brother     2 brothers  . Alzheimer's disease Brother   . Heart disease  Sister     pacemaker  . Breast cancer Daughter 72   Social History   Occupational History  . Not on file.   Social History Main Topics  . Smoking status: Former Smoker    Packs/day: 1.00    Years: 20.00    Types: Cigarettes    Quit date: 09/07/1988  . Smokeless tobacco: Never Used  . Alcohol use 0.0 oz/week     Comment: occasional  . Drug use: No  . Sexual activity: No    Tobacco Counseling Counseling given: Not Answered   Activities of Daily Living In your present state of health, do you have any difficulty performing the following activities: 09/18/2016  Hearing? N  Vision? Y  Difficulty concentrating or making decisions? N  Walking or climbing stairs? Y  Dressing or bathing? N  Doing errands, shopping? N    Preparing Food and eating ? N  Using the Toilet? N  In the past six months, have you accidently leaked urine? Y  Do you have problems with loss of bowel control? N  Managing your Medications? N  Managing your Finances? N  Housekeeping or managing your Housekeeping? N  Some recent data might be hidden    Immunizations and Health Maintenance Immunization History  Administered Date(s) Administered  . Influenza Split 06/29/2014  . Influenza, High Dose Seasonal PF 07/27/2016  . Influenza,inj,Quad PF,36+ Mos 06/18/2015  . Pneumococcal Conjugate-13 07/30/2014   Health Maintenance Due  Topic Date Due  . TETANUS/TDAP  12/03/1958  . ZOSTAVAX  12/03/1999  . DEXA SCAN  12/02/2004  . PNA vac Low Risk Adult (2 of 2 - PPSV23) 07/31/2015    Patient Care Team: Einar Pheasant, MD as PCP - General (Internal Medicine)  Indicate any recent Medical Services you may have received from other than [facility] providers in the past year (date may be approximate).     Assessment:   This is a routine wellness examination for [facility]. The goal of the wellness visit is to assist the patient how to close the gaps in care and create a preventative care plan for the patient.   Osteoporosis risk reviewed.  Medications reviewed; taking without issues or barriers.  Safety issues reviewed; lives alone.  Smoke detectors in the home. No firearms in the home. Wears seatbelts when driving or riding with others. No violence in the home.  No identified risk were noted; The patient was oriented x 3; appropriate in dress and manner and no objective failures at ADL's or IADL's.   BMI; discussed the importance of a healthy diet, water intake and exercise. Educational material provided.  HTN; followed by PCP.  Mammogram and Dexa Scan ordered; follow as directed.  Educational material provided.  Patient Concerns: None at this time. Follow up with PCP as needed.  Hearing/Vision screen Hearing Screening  Comments: Patient passes the whisper test Vision Screening Comments: Followed by [facility] (Dr. Wallace Going) Glaucoma Wears glasses Last OV 08/2016 Quarterly visits Visual acuity not assessed per patient preference since she has regular follow up with her ophthalmologist  Dietary issues and exercise activities discussed: Current Exercise Habits: Home exercise routine, Type of exercise: walking, Time (Minutes): 10, Frequency (Times/Week): 6, Weekly Exercise (Minutes/Week): 60, Intensity: Mild  Goals    . Increase physical activity          Home physical therapy and chair/range of motion exercises, as tolerated.      Depression Screen PHQ 2/9 Scores 07/27/2016 11/08/2015 11/02/2014 10/19/2013 10/09/2013 11/27/2012 08/14/2012  PHQ -  2 Score 0 0 0 0 0 0 0    Fall Risk Fall Risk  07/27/2016 11/08/2015 11/02/2014 10/19/2013 10/09/2013  Falls in the past year? No No No Yes Yes  Number falls in past yr: - - - 2 or more 1  Injury with Fall? - - - No No    Cognitive Function: MMSE - Mini Mental State Exam 09/18/2016  Orientation to time 5  Orientation to Place 5  Registration 3  Attention/ Calculation 5  Recall 3  Language- name 2 objects 2  Language- repeat 1  Language- follow 3 step command 3  Language- read & follow direction 1  Write a sentence 1  Copy design 1  Total score 30        Screening Tests Health Maintenance  Topic Date Due  . TETANUS/TDAP  12/03/1958  . ZOSTAVAX  12/03/1999  . DEXA SCAN  12/02/2004  . PNA vac Low Risk Adult (2 of 2 - PPSV23) 07/31/2015  . MAMMOGRAM  11/27/2016  . COLONOSCOPY  01/20/2019  . INFLUENZA VACCINE  Addressed      Plan:    End of life planning; Advance aging; Advanced directives discussed. Copy of current HCPOA/Living Will on file.    Medicare Attestation I have personally reviewed: The patient's medical and social history Their use of alcohol, tobacco or illicit drugs Their current medications and supplements The patient's  functional ability including ADLs,fall risks, home safety risks, cognitive, and hearing and visual impairment Diet and physical activities Evidence for depression   The patient's weight, height, BMI, and visual acuity have been recorded in the chart.  I have made referrals and provided education to the patient based on review of the above and I have provided the patient with a written personalized care plan for preventive services.    During the course of the visit, Rindy was educated and counseled about the following appropriate screening and preventive services:   Vaccines to include Pneumoccal, Influenza, Hepatitis B, Td, Zostavax, HCV  Electrocardiogram  Cardiovascular disease screening  Colorectal cancer screening  Bone density screening  Diabetes screening  Glaucoma screening  Mammography/PAP  Nutrition counseling  Smoking cessation counseling  Patient Instructions (the written plan) were given to the patient.    Varney Biles, LPN   [id number]    Reviewed above information.  Agree with plan.  Dr Nicki Reaper

## 2016-09-18 NOTE — Progress Notes (Signed)
Pre visit review using our clinic review tool, if applicable. No additional management support is needed unless otherwise documented below in the visit note. 

## 2016-09-18 NOTE — Patient Instructions (Addendum)
Saline nasal spray - flush nose at least 2-3x/day  nasacort nasal spray - 2 sprays each nostril one time per day.  Do this in the evening.    robutissun twice a day as needed.     Thank you for taking time to come for your Medicare Wellness Visit. I appreciate your ongoing commitment to your health goals. Please review the following plan we discussed and let me know if I can assist you in the future.   These are the goals we discussed: Goals    . Increase physical activity          Home physical therapy and chair/range of motion exercises, as tolerated.       This is a list of the screening recommended for you and due dates:  Health Maintenance  Topic Date Due  . Tetanus Vaccine  12/03/1958  . Shingles Vaccine  12/03/1999  . DEXA scan (bone density measurement)  12/02/2004  . Pneumonia vaccines (2 of 2 - PPSV23) 07/31/2015  . Mammogram  11/27/2016  . Colon Cancer Screening  01/20/2019  . Flu Shot  Addressed   Bone Densitometry Introduction Bone densitometry is an imaging test that uses a special X-ray to measure the amount of calcium and other minerals in your bones (bone density). This test is also known as a bone mineral density test or dual-energy X-ray absorptiometry (DXA). The test can measure bone density at your hip and your spine. It is similar to having a regular X-ray. You may have this test to:  Diagnose a condition that causes weak or thin bones (osteoporosis).  Predict your risk of a broken bone (fracture).  Determine how well osteoporosis treatment is working. Tell a health care provider about:  Any allergies you have.  All medicines you are taking, including vitamins, herbs, eye drops, creams, and over-the-counter medicines.  Any problems you or family members have had with anesthetic medicines.  Any blood disorders you have.  Any surgeries you have had.  Any medical conditions you have.  Possibility of pregnancy.  Any other medical test you had  within the previous 14 days that used contrast material. What are the risks? Generally, this is a safe procedure. However, problems can occur and may include the following:  This test exposes you to a very small amount of radiation.  The risks of radiation exposure may be greater to unborn children. What happens before the procedure?  Do not take any calcium supplements for 24 hours before having the test. You can otherwise eat and drink what you usually do.  Take off all metal jewelry, eyeglasses, dental appliances, and any other metal objects. What happens during the procedure?  You may lie on an exam table. There will be an X-ray generator below you and an imaging device above you.  Other devices, such as boxes or braces, may be used to position your body properly for the scan.  You will need to lie still while the machine slowly scans your body.  The images will show up on a computer monitor. What happens after the procedure? You may need more testing at a later time. This information is not intended to replace advice given to you by your health care provider. Make sure you discuss any questions you have with your health care provider. Document Released: 09/15/2004 Document Revised: 01/30/2016 Document Reviewed: 02/01/2014  2017 Elsevier   Mammogram A mammogram is an X-ray of the breasts that is done to check for abnormal changes. This procedure  can screen for and detect any changes that may suggest breast cancer. A mammogram can also identify other changes and variations in the breast, such as:  Inflammation of the breast tissue (mastitis).  An infected area that contains a collection of pus (abscess).  A fluid-filled sac (cyst).  Fibrocystic changes. This is when breast tissue becomes denser, which can make the tissue feel rope-like or uneven under the skin.  Tumors that are not cancerous (benign). Tell a health care provider about:  Any allergies you have.  If you  have breast implants.  If you have had previous breast disease, biopsy, or surgery.  If you are breastfeeding.  Any possibility that you could be pregnant, if this applies.  If you are younger than age 8.  If you have a family history of breast cancer. What are the risks? Generally, this is a safe procedure. However, problems may occur, including:  Exposure to radiation. Radiation levels are very low with this test.  The results being misinterpreted.  The need for further tests.  The inability of the mammogram to detect certain cancers. What happens before the procedure?  Schedule your test about 1-2 weeks after your menstrual period. This is usually when your breasts are the least tender.  If you have had a mammogram done at a different facility in the past, get the mammogram X-rays or have them sent to your current exam facility in order to compare them.  Wash your breasts and under your arms the day of the test.  Do not wear deodorants, perfumes, lotions, or powders anywhere on your body on the day of the test.  Remove any jewelry from your neck.  Wear clothes that you can change into and out of easily. What happens during the procedure?  You will undress from the waist up and put on a gown.  You will stand in front of the X-ray machine.  Each breast will be placed between two plastic or glass plates. The plates will compress your breast for a few seconds. Try to stay as relaxed as possible during the procedure. This does not cause any harm to your breasts and any discomfort you feel will be very brief.  X-rays will be taken from different angles of each breast. The procedure may vary among health care providers and hospitals. What happens after the procedure?  The mammogram will be examined by a specialist (radiologist).  You may need to repeat certain parts of the test, depending on the quality of the images. This is commonly done if the radiologist needs a  better view of the breast tissue.  Ask when your test results will be ready. Make sure you get your test results.  You may resume your normal activities. This information is not intended to replace advice given to you by your health care provider. Make sure you discuss any questions you have with your health care provider. Document Released: 08/21/2000 Document Revised: 01/27/2016 Document Reviewed: 11/02/2014 Elsevier Interactive Patient Education  2017 [employer].

## 2016-09-18 NOTE — Progress Notes (Signed)
Patient ID: LAURNA PLAZA, female   DOB: 09/29/1940, 76 y.o.   MRN: [id number]   Subjective:    Patient ID: [name], female    DOB: [birth year], 76 y.o.   MRN: [id number]  HPI  Patient here for a scheduled follow up.  She reports having increased congestion and cough.  Reports ears feel full.  Hoarse.  Increased cough and congestion.  Increased sinus pressure.  Low grade fever.  No chest pain.  No acid reflux.  No abdominal pain.  Bowels stable.  Blood pressure on outside checks averaging 117-120/70.  Handling stress well.  Overall feels she is doing better.  Did not start lexapro.  Doe snot feel needs at this time.     Past Medical History:  Diagnosis Date  . Anemia   . Arthritis   . Colon polyp   . Depression   . Diverticulosis   . GERD (gastroesophageal reflux disease)   . Glaucoma   . Hypercholesterolemia   . Hypertension   . Hypovolemia 12/10   acute renal failure  . Neuromuscular disorder ([place])    occasionally tingling in hands  . Sleep apnea    very mild   sleep study 8 yrs ago does not use CPAP  . Urinary incontinence    Past Surgical History:  Procedure Laterality Date  . ABDOMINAL HYSTERECTOMY  1979  . ANTERIOR CERVICAL DECOMP/DISCECTOMY FUSION N/A 11/28/2014   Procedure: CERVICAL FIVE-SIX  ANTERIOR CERVICAL DECOMPRESSION/DISCECTOMY FUSION 1 LEVEL;  Surgeon: Newman Pies, MD;  Location: [facility];  Service: Neurosurgery;  Laterality: N/A;  C56 anterior cervical decompression with fusion interbody prosthesis plating and bonegraft  . BACK SURGERY    . BLADDER SURGERY  1981  . BREAST REDUCTION SURGERY    . CARPAL TUNNEL RELEASE  2007  . CHOLECYSTECTOMY  1996  . FOOT SURGERY  2005  . [place]  . REDUCTION MAMMAPLASTY Bilateral 2001  . [place] & 2004   left and right  . SPINAL CORD STIMULATOR IMPLANT  03/1997  . SPINAL CORD STIMULATOR REMOVAL  2007  . Tendon repair right shoulder    . TOTAL KNEE ARTHROPLASTY  10/2011 &  09/13   left and right   Family History  Problem Relation Age of Onset  . Heart disease Mother   . Stroke Mother   . Hypertension Mother   . Hyperlipidemia Father   . Heart disease Father   . Heart disease Brother     2 brothers  . Alzheimer's disease Brother   . Heart disease Sister     pacemaker  . Breast cancer Daughter 86   Social History   Social History  . Marital status: Widowed    Spouse name: N/A  . Number of children: 2  . Years of education: N/A   Social History Main Topics  . Smoking status: Former Smoker    Packs/day: 1.00    Years: 20.00    Types: Cigarettes    Quit date: 09/07/1988  . Smokeless tobacco: Never Used  . Alcohol use 0.0 oz/week     Comment: occasional  . Drug use: No  . Sexual activity: No   Other Topics Concern  . None   Social History Narrative   She is widowed, has two daughters.    Outpatient Encounter Prescriptions as of 09/18/2016  Medication Sig  . escitalopram (LEXAPRO) 10 MG tablet Take 1 tablet (10 mg total) by mouth daily.  Marland Kitchen latanoprost (  XALATAN) 0.005 % ophthalmic solution Place 1 drop into both eyes at bedtime.   . pantoprazole (PROTONIX) 40 MG tablet Take 40 mg by mouth daily.  . rosuvastatin (CRESTOR) 20 MG tablet Take 1 tablet (20 mg total) by mouth daily.  Marland Kitchen tolterodine (DETROL LA) 4 MG 24 hr capsule Take 1 capsule (4 mg total) by mouth daily.  . valsartan (DIOVAN) 80 MG tablet TAKE 1 TABLET(80 MG) BY MOUTH DAILY  . azithromycin (ZITHROMAX) 250 MG tablet Take 2 tablets x 1 day and then 1 tablet per day for four more days.  . predniSONE (DELTASONE) 10 MG tablet Take 4 tablets x 1 day and then decrease by 1/2 tablet per day until down to zero mg.   No facility-administered encounter medications on file as of 09/18/2016.     Review of Systems  Constitutional: Negative for appetite change and unexpected weight change.  HENT: Positive for congestion, postnasal drip and sinus pressure.   Respiratory: Positive for cough.  Negative for chest tightness and shortness of breath.   Cardiovascular: Negative for chest pain, palpitations and leg swelling.  Gastrointestinal: Negative for abdominal pain, nausea and vomiting.  Genitourinary: Negative for difficulty urinating and dysuria.  Musculoskeletal: Negative for joint swelling and myalgias.  Skin: Negative for color change and rash.  Neurological: Negative for dizziness and headaches.  Psychiatric/Behavioral: Negative for agitation and dysphoric mood.       Handling stress relatively well.  Never started lexapro.         Objective:    Physical Exam  Constitutional: She appears well-developed and well-nourished. No distress.  HENT:  Mouth/Throat: Oropharynx is clear and moist.  Nares - slightly erythematous turbinates.   Eyes: Conjunctivae are normal. Right eye exhibits no discharge. Left eye exhibits no discharge.  Neck: Neck supple. No thyromegaly present.  Cardiovascular: Normal rate and regular rhythm.   Pulmonary/Chest: Breath sounds normal. No respiratory distress. She has no wheezes.  Increased cough with forced expiration.    Abdominal: Soft. Bowel sounds are normal. There is no tenderness.  Musculoskeletal: She exhibits no edema or tenderness.  Lymphadenopathy:    She has no cervical adenopathy.  Skin: No rash noted. No erythema.  Psychiatric: She has a normal mood and affect. Her behavior is normal.    BP 134/86   Pulse 77   Temp 98.2 F (36.8 C) (Oral)   Ht 5\' 4"  (1.626 m)   Wt 164 lb 9.6 oz (74.7 kg)   LMP 08/15/1978   SpO2 98%   BMI 28.25 kg/m  Wt Readings from Last 3 Encounters:  09/18/16 164 lb 9.6 oz (74.7 kg)  07/27/16 164 lb 12.8 oz (74.8 kg)  04/20/16 161 lb 4 oz (73.1 kg)     Lab Results  Component Value Date   WBC 6.1 02/20/2016   HGB 13.1 02/20/2016   HCT 38.5 02/20/2016   PLT 306.0 02/20/2016   GLUCOSE 105 (H) 07/27/2016   CHOL 188 07/27/2016   TRIG 122.0 07/27/2016   HDL 101.50 07/27/2016   LDLDIRECT 147.1  05/30/2013   LDLCALC 62 07/27/2016   ALT 16 07/27/2016   AST 22 07/27/2016   NA 140 07/27/2016   K 5.0 07/27/2016   CL 103 07/27/2016   CREATININE 0.85 07/27/2016   BUN 21 07/27/2016   CO2 29 07/27/2016   TSH 2.00 02/20/2016   INR 1.0 10/16/2013    Mm Digital Screening Bilateral  Result Date: 11/28/2015 CLINICAL DATA:  Screening. EXAM: DIGITAL SCREENING BILATERAL MAMMOGRAM WITH CAD  COMPARISON:  Previous exam(s). ACR Breast Density Category b: There are scattered areas of fibroglandular density. FINDINGS: There are no findings suspicious for malignancy. Images were processed with CAD. IMPRESSION: No mammographic evidence of malignancy. A result letter of this screening mammogram will be mailed directly to the patient. RECOMMENDATION: Screening mammogram in one year. (Code:SM-B-01Y) BI-RADS CATEGORY  1: Negative. Electronically Signed   By: Pamelia Hoit M.D.   On: 11/28/2015 16:15       Assessment & Plan:   Problem List Items Addressed This Visit    Aortic aneurysm ([place])    Followed by cardiology.  Stable.        GERD (gastroesophageal reflux disease)    Controlled on protonix.  Follow.       Hypercholesterolemia    On crestor.  Low cholesterol diet and exercise.  Follow lipid panel and liver function tests.        Relevant Orders   Hepatic function panel   Lipid panel   Hypertension    On recheck improved.  Same medication regimen.  Follow pressures.  Follow metabolic panel.       Relevant Orders   Basic metabolic panel   Stress    Handling stress as outlined.  Did not start lexapro.  Does not feel needs at this time.  Follow.  Feels better and doing better.        URI (upper respiratory infection)    With increased cough and congestion as outlined.  Saline nasal spray and nasacort nasal spray as outlined.  Mucinex/robitussin as directed.  Prednisone taper as directed.  Hold abx.  See if improves with above regimen.  zpak abx prescription given.  If symptoms worsen (or  do not improve), start abx.  Follow.        Relevant Medications   azithromycin (ZITHROMAX) 250 MG tablet    Other Visit Diagnoses    Estrogen deficiency    -  Primary   Relevant Orders   DEXAScan   Screening for breast cancer       Relevant Orders   Mammogram Digital Screening   Encounter for Medicare annual wellness exam           Einar Pheasant, MD

## 2016-09-20 ENCOUNTER — Encounter: Payer: Self-pay | Admitting: Internal Medicine

## 2016-09-20 NOTE — Assessment & Plan Note (Signed)
On recheck improved.  Same medication regimen.  Follow pressures.  Follow metabolic panel.

## 2016-09-20 NOTE — Assessment & Plan Note (Signed)
With increased cough and congestion as outlined.  Saline nasal spray and nasacort nasal spray as outlined.  Mucinex/robitussin as directed.  Prednisone taper as directed.  Hold abx.  See if improves with above regimen.  zpak abx prescription given.  If symptoms worsen (or do not improve), start abx.  Follow.

## 2016-09-20 NOTE — Assessment & Plan Note (Signed)
Followed by cardiology. Stable.   

## 2016-09-20 NOTE — Assessment & Plan Note (Signed)
On crestor.  Low cholesterol diet and exercise.  Follow lipid panel and liver function tests.   

## 2016-09-20 NOTE — Assessment & Plan Note (Signed)
Controlled on protonix.  Follow.   

## 2016-09-20 NOTE — Assessment & Plan Note (Signed)
Handling stress as outlined.  Did not start lexapro.  Does not feel needs at this time.  Follow.  Feels better and doing better.

## 2016-09-29 ENCOUNTER — Other Ambulatory Visit: Payer: Self-pay | Admitting: Internal Medicine

## 2016-09-29 NOTE — Telephone Encounter (Signed)
Refilled 01/27/16. Pt last seen 09/18/16. Please advise?

## 2016-09-30 ENCOUNTER — Other Ambulatory Visit: Payer: Self-pay | Admitting: Internal Medicine

## 2016-10-22 DIAGNOSIS — M19012 Primary osteoarthritis, left shoulder: Secondary | ICD-10-CM | POA: Diagnosis not present

## 2016-10-22 DIAGNOSIS — M25512 Pain in left shoulder: Secondary | ICD-10-CM | POA: Diagnosis not present

## 2016-11-04 ENCOUNTER — Other Ambulatory Visit: Payer: Self-pay | Admitting: Internal Medicine

## 2016-11-10 DIAGNOSIS — H401131 Primary open-angle glaucoma, bilateral, mild stage: Secondary | ICD-10-CM | POA: Diagnosis not present

## 2016-11-13 DIAGNOSIS — M7581 Other shoulder lesions, right shoulder: Secondary | ICD-10-CM | POA: Diagnosis not present

## 2016-11-13 DIAGNOSIS — G5601 Carpal tunnel syndrome, right upper limb: Secondary | ICD-10-CM | POA: Diagnosis not present

## 2016-11-13 DIAGNOSIS — M542 Cervicalgia: Secondary | ICD-10-CM | POA: Diagnosis not present

## 2016-11-13 DIAGNOSIS — M5030 Other cervical disc degeneration, unspecified cervical region: Secondary | ICD-10-CM | POA: Diagnosis not present

## 2016-11-13 DIAGNOSIS — M7582 Other shoulder lesions, left shoulder: Secondary | ICD-10-CM | POA: Diagnosis not present

## 2016-11-17 DIAGNOSIS — H401113 Primary open-angle glaucoma, right eye, severe stage: Secondary | ICD-10-CM | POA: Diagnosis not present

## 2016-11-30 ENCOUNTER — Ambulatory Visit
Admission: RE | Admit: 2016-11-30 | Discharge: 2016-11-30 | Disposition: A | Discharge status: Home / Self Care | Payer: Medicare Other | Source: Ambulatory Visit | Attending: Internal Medicine | Admitting: Internal Medicine

## 2016-11-30 DIAGNOSIS — M8580 Other specified disorders of bone density and structure, unspecified site: Secondary | ICD-10-CM | POA: Diagnosis not present

## 2016-11-30 DIAGNOSIS — Z1231 Encounter for screening mammogram for malignant neoplasm of breast: Secondary | ICD-10-CM | POA: Diagnosis not present

## 2016-11-30 DIAGNOSIS — M85852 Other specified disorders of bone density and structure, left thigh: Secondary | ICD-10-CM | POA: Diagnosis not present

## 2016-11-30 DIAGNOSIS — Z13820 Encounter for screening for osteoporosis: Secondary | ICD-10-CM | POA: Diagnosis not present

## 2016-11-30 DIAGNOSIS — E2839 Other primary ovarian failure: Secondary | ICD-10-CM

## 2016-11-30 DIAGNOSIS — Z1239 Encounter for other screening for malignant neoplasm of breast: Secondary | ICD-10-CM

## 2016-12-01 ENCOUNTER — Telehealth: Payer: Self-pay

## 2016-12-01 NOTE — Telephone Encounter (Signed)
-----   Message from Einar Pheasant, MD sent at 12/01/2016  5:55 AM EDT ----- Notify pt that her bone density reveals osteopenia.  Some bone loss, but does not meet criteria for osteoporosis.  Continue weight bearing exercise, calcium and vitamin D.  We will discuss more at her upcoming appt.  Make note on upcoming office visit to discuss.

## 2016-12-01 NOTE — Telephone Encounter (Signed)
Left message to return call to our office.  

## 2016-12-03 NOTE — Telephone Encounter (Signed)
Left message to return call to our office.  

## 2016-12-08 NOTE — Telephone Encounter (Signed)
Left message to return call to our office.  

## 2016-12-11 NOTE — Telephone Encounter (Signed)
Letter mailed to patient.

## 2016-12-15 DIAGNOSIS — H2513 Age-related nuclear cataract, bilateral: Secondary | ICD-10-CM | POA: Diagnosis not present

## 2016-12-29 ENCOUNTER — Other Ambulatory Visit: Payer: Medicare Other

## 2016-12-29 NOTE — Discharge Instructions (Signed)
Cataract Surgery, Care After °Refer to this sheet in the next few weeks. These instructions provide you with information about caring for yourself after your procedure. Your health care provider may also give you more specific instructions. Your treatment has been planned according to current medical practices, but problems sometimes occur. Call your health care provider if you have any problems or questions after your procedure. °What can I expect after the procedure? °After the procedure, it is common to have: °· Itching. °· Discomfort. °· Fluid discharge. °· Sensitivity to light and to touch. °· Bruising. °Follow these instructions at home: °Eye Care  °· Check your eye every day for signs of infection. Watch for: °¨ Redness, swelling, or pain. °¨ Fluid, blood, or pus. °¨ Warmth. °¨ Bad smell. °Activity  °· Avoid strenuous activities, such as playing contact sports, for as long as told by your health care provider. °· Do not drive or operate heavy machinery until your health care provider approves. °· Do not bend or lift heavy objects . Bending increases pressure in the eye. You can walk, climb stairs, and do light household chores. °· Ask your health care provider when you can return to work. If you work in a dusty environment, you may be advised to wear protective eyewear for a period of time. °General instructions  °· Take or apply over-the-counter and prescription medicines only as told by your health care provider. This includes eye drops. °· Do not touch or rub your eyes. °· If you were given a protective shield, wear it as told by your health care provider. If you were not given a protective shield, wear sunglasses as told by your health care provider to protect your eyes. °· Keep the area around your eye clean and dry. Avoid swimming or allowing water to hit you directly in the face while showering until told by your health care provider. Keep soap and shampoo out of your eyes. °· Do not put a contact lens  into the affected eye or eyes until your health care provider approves. °· Keep all follow-up visits as told by your health care provider. This is important. °Contact a health care provider if: ° °· You have increased bruising around your eye. °· You have pain that is not helped with medicine. °· You have a fever. °· You have redness, swelling, or pain in your eye. °· You have fluid, blood, or pus coming from your incision. °· Your vision gets worse. °Get help right away if: °· You have sudden vision loss. °This information is not intended to replace advice given to you by your health care provider. Make sure you discuss any questions you have with your health care provider. °Document Released: 03/13/2005 Document Revised: 01/02/2016 Document Reviewed: 07/04/2015 °Elsevier Interactive Patient Education © 2017 [employer] ° ° ° ° °General Anesthesia, Adult, Care After °These instructions provide you with information about caring for yourself after your procedure. Your health care provider may also give you more specific instructions. Your treatment has been planned according to current medical practices, but problems sometimes occur. Call your health care provider if you have any problems or questions after your procedure. °What can I expect after the procedure? °After the procedure, it is common to have: °· Vomiting. °· A sore throat. °· Mental slowness. °It is common to feel: °· Nauseous. °· Cold or shivery. °· Sleepy. °· Tired. °· Sore or achy, even in parts of your body where you did not have surgery. °Follow these instructions at   home: °For at least 24 hours after the procedure:  °· Do not: °¨ Participate in activities where you could fall or become injured. °¨ Drive. °¨ Use heavy machinery. °¨ Drink alcohol. °¨ Take sleeping pills or medicines that cause drowsiness. °¨ Make important decisions or sign legal documents. °¨ Take care of children on your own. °· Rest. °Eating and drinking  °· If you vomit, drink  water, juice, or soup when you can drink without vomiting. °· Drink enough fluid to keep your urine clear or pale yellow. °· Make sure you have little or no nausea before eating solid foods. °· Follow the diet recommended by your health care provider. °General instructions  °· Have a responsible adult stay with you until you are awake and alert. °· Return to your normal activities as told by your health care provider. Ask your health care provider what activities are safe for you. °· Take over-the-counter and prescription medicines only as told by your health care provider. °· If you smoke, do not smoke without supervision. °· Keep all follow-up visits as told by your health care provider. This is important. °Contact a health care provider if: °· You continue to have nausea or vomiting at home, and medicines are not helpful. °· You cannot drink fluids or start eating again. °· You cannot urinate after 8-12 hours. °· You develop a skin rash. °· You have fever. °· You have increasing redness at the site of your procedure. °Get help right away if: °· You have difficulty breathing. °· You have chest pain. °· You have unexpected bleeding. °· You feel that you are having a life-threatening or urgent problem. °This information is not intended to replace advice given to you by your health care provider. Make sure you discuss any questions you have with your health care provider. °Document Released: 11/30/2000 Document Revised: 01/27/2016 Document Reviewed: 08/08/2015 °Elsevier Interactive Patient Education © 2017 [employer] ° °

## 2016-12-30 ENCOUNTER — Ambulatory Visit
Admission: RE | Admit: 2016-12-30 | Discharge: 2016-12-30 | Disposition: A | Discharge status: Home / Self Care | Payer: Medicare Other | Source: Ambulatory Visit | Attending: Ophthalmology | Admitting: Ophthalmology

## 2016-12-30 ENCOUNTER — Ambulatory Visit: Payer: Medicare Other | Admitting: Anesthesiology

## 2016-12-30 ENCOUNTER — Encounter
Admission: RE | Disposition: A | Discharge status: Home / Self Care | Payer: Self-pay | Source: Ambulatory Visit | Attending: Ophthalmology

## 2016-12-30 DIAGNOSIS — H2511 Age-related nuclear cataract, right eye: Secondary | ICD-10-CM | POA: Diagnosis not present

## 2016-12-30 DIAGNOSIS — Z79899 Other long term (current) drug therapy: Secondary | ICD-10-CM | POA: Diagnosis not present

## 2016-12-30 DIAGNOSIS — Z87891 Personal history of nicotine dependence: Secondary | ICD-10-CM | POA: Insufficient documentation

## 2016-12-30 DIAGNOSIS — G4730 Sleep apnea, unspecified: Secondary | ICD-10-CM | POA: Diagnosis not present

## 2016-12-30 DIAGNOSIS — I10 Essential (primary) hypertension: Secondary | ICD-10-CM | POA: Diagnosis not present

## 2016-12-30 DIAGNOSIS — H2513 Age-related nuclear cataract, bilateral: Secondary | ICD-10-CM | POA: Diagnosis not present

## 2016-12-30 HISTORY — DX: Aortic aneurysm of unspecified site, without rupture: I71.9

## 2016-12-30 HISTORY — DX: Other bacterial infections of unspecified site: A49.8

## 2016-12-30 HISTORY — DX: Cough: R05

## 2016-12-30 HISTORY — DX: Unspecified osteoarthritis, unspecified site: M19.90

## 2016-12-30 HISTORY — DX: Other specified disorders of bone density and structure, unspecified site: M85.80

## 2016-12-30 HISTORY — PX: CATARACT EXTRACTION W/PHACO: SHX586

## 2016-12-30 HISTORY — DX: Pneumonia, unspecified organism: J18.9

## 2016-12-30 HISTORY — DX: Chronic cough: R05.3

## 2016-12-30 HISTORY — DX: Bronchitis, not specified as acute or chronic: J40

## 2016-12-30 HISTORY — DX: Irritable bowel syndrome, unspecified: K58.9

## 2016-12-30 HISTORY — DX: Atherosclerotic heart disease of native coronary artery without angina pectoris: I25.10

## 2016-12-30 SURGERY — PHACOEMULSIFICATION, CATARACT, WITH IOL INSERTION
Anesthesia: Monitor Anesthesia Care | Site: Eye | Laterality: Right | Wound class: Clean

## 2016-12-30 MED ORDER — BRIMONIDINE TARTRATE-TIMOLOL 0.2-0.5 % OP SOLN
OPHTHALMIC | Status: DC | PRN
  Administered 2016-12-30: 1 [drp] via OPHTHALMIC

## 2016-12-30 MED ORDER — EPINEPHRINE PF 1 MG/ML IJ SOLN
INTRAMUSCULAR | Status: DC | PRN
  Administered 2016-12-30: 43 mL via OPHTHALMIC

## 2016-12-30 MED ORDER — CEFUROXIME OPHTHALMIC INJECTION 1 MG/0.1 ML
INJECTION | OPHTHALMIC | Status: DC | PRN
  Administered 2016-12-30: .3 mL via OPHTHALMIC

## 2016-12-30 MED ORDER — FENTANYL CITRATE (PF) 100 MCG/2ML IJ SOLN
INTRAMUSCULAR | Status: DC | PRN
  Administered 2016-12-30: 50 ug via INTRAVENOUS

## 2016-12-30 MED ORDER — LIDOCAINE HCL (PF) 2 % IJ SOLN
INTRAOCULAR | Status: DC | PRN
  Administered 2016-12-30: 1 mL via INTRAOCULAR

## 2016-12-30 MED ORDER — MOXIFLOXACIN HCL 0.5 % OP SOLN
1.0000 [drp] | OPHTHALMIC | Status: DC | PRN
  Administered 2016-12-30 (×3): 1 [drp] via OPHTHALMIC

## 2016-12-30 MED ORDER — ARMC OPHTHALMIC DILATING DROPS
1.0000 "application " | OPHTHALMIC | Status: DC | PRN
  Administered 2016-12-30 (×3): 1 via OPHTHALMIC

## 2016-12-30 MED ORDER — NA HYALUR & NA CHOND-NA HYALUR 0.4-0.35 ML IO KIT
PACK | INTRAOCULAR | Status: DC | PRN
  Administered 2016-12-30: 1 mL via INTRAOCULAR

## 2016-12-30 MED ORDER — MIDAZOLAM HCL 2 MG/2ML IJ SOLN
INTRAMUSCULAR | Status: DC | PRN
  Administered 2016-12-30: 2 mg via INTRAVENOUS

## 2016-12-30 MED ORDER — LACTATED RINGERS IV SOLN: INTRAVENOUS | Status: DC

## 2016-12-30 SURGICAL SUPPLY — 28 items
CANNULA ANT/CHMB 27GA (MISCELLANEOUS) ×3 IMPLANT
CARTRIDGE ABBOTT (MISCELLANEOUS) IMPLANT
GLOVE SURG LX 7.5 STRW (GLOVE) ×2
GLOVE SURG LX STRL 7.5 STRW (GLOVE) ×1 IMPLANT
GLOVE SURG TRIUMPH 8.0 PF LTX (GLOVE) ×3 IMPLANT
GOWN STRL REUS W/ TWL LRG LVL3 (GOWN DISPOSABLE) ×2 IMPLANT
GOWN STRL REUS W/TWL LRG LVL3 (GOWN DISPOSABLE) ×4
KNIFE CLEARCUT SLIT (MISCELLANEOUS) ×3 IMPLANT
KNIFE SIDECUT EYE (MISCELLANEOUS) ×3 IMPLANT
LENS IOL ACRSF IQ ULTRA 23.5 (Intraocular Lens) ×1 IMPLANT
LENS IOL ACRYSOF IQ 23.5 (Intraocular Lens) ×3 IMPLANT
MARKER SKIN DUAL TIP RULER LAB (MISCELLANEOUS) ×3 IMPLANT
NDL RETROBULBAR .5 NSTRL (NEEDLE) IMPLANT
NEEDLE FILTER BLUNT 18X 1/2SAF (NEEDLE) ×2
NEEDLE FILTER BLUNT 18X1 1/2 (NEEDLE) ×1 IMPLANT
PACK CATARACT BRASINGTON (MISCELLANEOUS) IMPLANT
PACK EYE AFTER SURG (MISCELLANEOUS) ×3 IMPLANT
PACK OPTHALMIC (MISCELLANEOUS) ×3 IMPLANT
RING MALYGIN 7.0 (MISCELLANEOUS) IMPLANT
SUT ETHILON 10-0 CS-B-6CS-B-6 (SUTURE)
SUT VICRYL  9 0 (SUTURE)
SUT VICRYL 9 0 (SUTURE) IMPLANT
SUTURE EHLN 10-0 CS-B-6CS-B-6 (SUTURE) IMPLANT
SYR 3ML LL SCALE MARK (SYRINGE) ×3 IMPLANT
SYR 5ML LL (SYRINGE) ×3 IMPLANT
SYR TB 1ML LUER SLIP (SYRINGE) ×3 IMPLANT
WATER STERILE IRR 250ML POUR (IV SOLUTION) ×3 IMPLANT
WIPE NON LINTING 3.25X3.25 (MISCELLANEOUS) ×3 IMPLANT

## 2016-12-30 NOTE — Anesthesia Postprocedure Evaluation (Addendum)
Anesthesia Post Note  Patient: Leah Huber  Procedure(s) Performed: Procedure(s) (LRB): CATARACT EXTRACTION PHACO AND INTRAOCULAR LENS PLACEMENT (IOC)  Right toric lens (Right)  Patient location during evaluation: PACU Anesthesia Type: MAC Level of consciousness: awake Pain management: pain level controlled Vital Signs Assessment: post-procedure vital signs reviewed and stable Respiratory status: spontaneous breathing Cardiovascular status: blood pressure returned to baseline Postop Assessment: no headache Anesthetic complications: no    Jaci Standard, III,  Javan Gonzaga D

## 2016-12-30 NOTE — Op Note (Signed)
LOCATION:  [place]   PREOPERATIVE DIAGNOSIS:    Nuclear sclerotic cataract right eye. H25.11   POSTOPERATIVE DIAGNOSIS:  Nuclear sclerotic cataract right eye.     PROCEDURE:  Phacoemusification with posterior chamber intraocular lens placement of the right eye   LENS:   Implant Name Type Inv. Item Serial No. Manufacturer Lot No. LRB No. Used  LENS IOL ACRYSOF IQ 23.5 - [id number] Intraocular Lens LENS IOL ACRYSOF IQ 23.5 [id number] ALCON   Right 1        ULTRASOUND TIME: 13 % of 0 minutes, 48 seconds.  CDE 6.2   SURGEON:  Wyonia Hough, MD   ANESTHESIA:  Topical with tetracaine drops and 2% Xylocaine jelly, augmented with 1% preservative-free intracameral lidocaine.    COMPLICATIONS:  None.   DESCRIPTION OF PROCEDURE:  The patient was identified in the holding room and transported to the operating room and placed in the supine position under the operating microscope.  The right eye was identified as the operative eye and it was prepped and draped in the usual sterile ophthalmic fashion.   A 1 millimeter clear-corneal paracentesis was made at the 12:00 position.  0.5 ml of preservative-free 1% lidocaine was injected into the anterior chamber. The anterior chamber was filled with Viscoat viscoelastic.  A 2.4 millimeter keratome was used to make a near-clear corneal incision at the 9:00 position.  A curvilinear capsulorrhexis was made with a cystotome and capsulorrhexis forceps.  Balanced salt solution was used to hydrodissect and hydrodelineate the nucleus.   Phacoemulsification was then used in stop and chop fashion to remove the lens nucleus and epinucleus.  The remaining cortex was then removed using the irrigation and aspiration handpiece. Provisc was then placed into the capsular bag to distend it for lens placement.  A lens was then injected into the capsular bag.  The remaining viscoelastic was aspirated.   Wounds were hydrated with balanced salt solution.   The anterior chamber was inflated to a physiologic pressure with balanced salt solution.  No wound leaks were noted. Cefuroxime 0.1 ml of a 10mg /ml solution was injected into the anterior chamber for a dose of 1 mg of intracameral antibiotic at the completion of the case.   Timolol and Brimonidine drops were applied to the eye.  The patient was taken to the recovery room in stable condition without complications of anesthesia or surgery.   Klaira Pesci 12/30/2016, 10:57 AM

## 2016-12-30 NOTE — H&P (Signed)
The History and Physical notes are on paper, have been signed, and are to be scanned. The patient remains stable and unchanged from the H&P.   Previous H&P reviewed, patient examined, and there are no changes.  Mitsuo Budnick 12/30/2016 10:31 AM

## 2016-12-30 NOTE — Transfer of Care (Signed)
Immediate Anesthesia Transfer of Care Note  Patient: Leah Huber  Procedure(s) Performed: Procedure(s) with comments: CATARACT EXTRACTION PHACO AND INTRAOCULAR LENS PLACEMENT (IOC)  Right toric lens (Right) - sleep apnea, does not use CPAP  Patient Location: PACU  Anesthesia Type: MAC  Level of Consciousness: awake, alert  and patient cooperative  Airway and Oxygen Therapy: Patient Spontanous Breathing and Patient connected to supplemental oxygen  Post-op Assessment: Post-op Vital signs reviewed, Patient's Cardiovascular Status Stable, Respiratory Function Stable, Patent Airway and No signs of Nausea or vomiting  Post-op Vital Signs: Reviewed and stable  Complications: No apparent anesthesia complications

## 2016-12-30 NOTE — Anesthesia Preprocedure Evaluation (Signed)
Anesthesia Evaluation  Patient identified by MRN, date of birth, ID band Patient awake    Reviewed: Allergy & Precautions, H&P , NPO status , Patient's Chart, lab work & pertinent test results  Airway Mallampati: II  TM Distance: >3 FB Neck ROM: full    Dental no notable dental hx.    Pulmonary sleep apnea , former smoker,    Pulmonary exam normal        Cardiovascular hypertension, On Medications Normal cardiovascular exam     Neuro/Psych    GI/Hepatic Neg liver ROS, Medicated,  Endo/Other    Renal/GU negative Renal ROS     Musculoskeletal   Abdominal   Peds  Hematology negative hematology ROS (+)   Anesthesia Other Findings   Reproductive/Obstetrics negative OB ROS                             Anesthesia Physical Anesthesia Plan  ASA: II  Anesthesia Plan: MAC   Post-op Pain Management:    Induction:   Airway Management Planned:   Additional Equipment:   Intra-op Plan:   Post-operative Plan:   Informed Consent: I have reviewed the patients History and Physical, chart, labs and discussed the procedure including the risks, benefits and alternatives for the proposed anesthesia with the patient or authorized representative who has indicated his/her understanding and acceptance.     Plan Discussed with:   Anesthesia Plan Comments:         Anesthesia Quick Evaluation

## 2016-12-30 NOTE — Anesthesia Procedure Notes (Signed)
Procedure Name: MAC Performed by: Mayme Genta Pre-anesthesia Checklist: Patient identified, Emergency Drugs available, Suction available, Timeout performed and Patient being monitored Patient Re-evaluated:Patient Re-evaluated prior to inductionOxygen Delivery Method: Nasal cannula Placement Confirmation: positive ETCO2

## 2016-12-31 ENCOUNTER — Encounter: Payer: Self-pay | Admitting: Ophthalmology

## 2016-12-31 ENCOUNTER — Ambulatory Visit: Payer: Medicare Other | Admitting: Internal Medicine

## 2017-01-08 DIAGNOSIS — H2512 Age-related nuclear cataract, left eye: Secondary | ICD-10-CM | POA: Diagnosis not present

## 2017-01-12 DIAGNOSIS — Z79899 Other long term (current) drug therapy: Secondary | ICD-10-CM | POA: Diagnosis not present

## 2017-01-12 DIAGNOSIS — H2512 Age-related nuclear cataract, left eye: Secondary | ICD-10-CM | POA: Diagnosis not present

## 2017-01-12 DIAGNOSIS — I10 Essential (primary) hypertension: Secondary | ICD-10-CM | POA: Diagnosis not present

## 2017-01-12 DIAGNOSIS — K219 Gastro-esophageal reflux disease without esophagitis: Secondary | ICD-10-CM | POA: Diagnosis not present

## 2017-01-12 DIAGNOSIS — I2510 Atherosclerotic heart disease of native coronary artery without angina pectoris: Secondary | ICD-10-CM | POA: Diagnosis not present

## 2017-01-12 DIAGNOSIS — Z87891 Personal history of nicotine dependence: Secondary | ICD-10-CM | POA: Diagnosis not present

## 2017-01-12 DIAGNOSIS — G4730 Sleep apnea, unspecified: Secondary | ICD-10-CM | POA: Diagnosis not present

## 2017-01-14 NOTE — Discharge Instructions (Signed)
Cataract Surgery, Care After °Refer to this sheet in the next few weeks. These instructions provide you with information about caring for yourself after your procedure. Your health care provider may also give you more specific instructions. Your treatment has been planned according to current medical practices, but problems sometimes occur. Call your health care provider if you have any problems or questions after your procedure. °What can I expect after the procedure? °After the procedure, it is common to have: °· Itching. °· Discomfort. °· Fluid discharge. °· Sensitivity to light and to touch. °· Bruising. °Follow these instructions at home: °Eye Care  °· Check your eye every day for signs of infection. Watch for: °¨ Redness, swelling, or pain. °¨ Fluid, blood, or pus. °¨ Warmth. °¨ Bad smell. °Activity  °· Avoid strenuous activities, such as playing contact sports, for as long as told by your health care provider. °· Do not drive or operate heavy machinery until your health care provider approves. °· Do not bend or lift heavy objects . Bending increases pressure in the eye. You can walk, climb stairs, and do light household chores. °· Ask your health care provider when you can return to work. If you work in a dusty environment, you may be advised to wear protective eyewear for a period of time. °General instructions  °· Take or apply over-the-counter and prescription medicines only as told by your health care provider. This includes eye drops. °· Do not touch or rub your eyes. °· If you were given a protective shield, wear it as told by your health care provider. If you were not given a protective shield, wear sunglasses as told by your health care provider to protect your eyes. °· Keep the area around your eye clean and dry. Avoid swimming or allowing water to hit you directly in the face while showering until told by your health care provider. Keep soap and shampoo out of your eyes. °· Do not put a contact lens  into the affected eye or eyes until your health care provider approves. °· Keep all follow-up visits as told by your health care provider. This is important. °Contact a health care provider if: ° °· You have increased bruising around your eye. °· You have pain that is not helped with medicine. °· You have a fever. °· You have redness, swelling, or pain in your eye. °· You have fluid, blood, or pus coming from your incision. °· Your vision gets worse. °Get help right away if: °· You have sudden vision loss. °This information is not intended to replace advice given to you by your health care provider. Make sure you discuss any questions you have with your health care provider. °Document Released: 03/13/2005 Document Revised: 01/02/2016 Document Reviewed: 07/04/2015 °Elsevier Interactive Patient Education © 2017 [employer] ° ° ° ° °General Anesthesia, Adult, Care After °These instructions provide you with information about caring for yourself after your procedure. Your health care provider may also give you more specific instructions. Your treatment has been planned according to current medical practices, but problems sometimes occur. Call your health care provider if you have any problems or questions after your procedure. °What can I expect after the procedure? °After the procedure, it is common to have: °· Vomiting. °· A sore throat. °· Mental slowness. °It is common to feel: °· Nauseous. °· Cold or shivery. °· Sleepy. °· Tired. °· Sore or achy, even in parts of your body where you did not have surgery. °Follow these instructions at   home: °For at least 24 hours after the procedure:  °· Do not: °¨ Participate in activities where you could fall or become injured. °¨ Drive. °¨ Use heavy machinery. °¨ Drink alcohol. °¨ Take sleeping pills or medicines that cause drowsiness. °¨ Make important decisions or sign legal documents. °¨ Take care of children on your own. °· Rest. °Eating and drinking  °· If you vomit, drink  water, juice, or soup when you can drink without vomiting. °· Drink enough fluid to keep your urine clear or pale yellow. °· Make sure you have little or no nausea before eating solid foods. °· Follow the diet recommended by your health care provider. °General instructions  °· Have a responsible adult stay with you until you are awake and alert. °· Return to your normal activities as told by your health care provider. Ask your health care provider what activities are safe for you. °· Take over-the-counter and prescription medicines only as told by your health care provider. °· If you smoke, do not smoke without supervision. °· Keep all follow-up visits as told by your health care provider. This is important. °Contact a health care provider if: °· You continue to have nausea or vomiting at home, and medicines are not helpful. °· You cannot drink fluids or start eating again. °· You cannot urinate after 8-12 hours. °· You develop a skin rash. °· You have fever. °· You have increasing redness at the site of your procedure. °Get help right away if: °· You have difficulty breathing. °· You have chest pain. °· You have unexpected bleeding. °· You feel that you are having a life-threatening or urgent problem. °This information is not intended to replace advice given to you by your health care provider. Make sure you discuss any questions you have with your health care provider. °Document Released: 11/30/2000 Document Revised: 01/27/2016 Document Reviewed: 08/08/2015 °Elsevier Interactive Patient Education © 2017 [employer] ° °

## 2017-01-20 ENCOUNTER — Ambulatory Visit
Admission: RE | Admit: 2017-01-20 | Discharge: 2017-01-20 | Disposition: A | Discharge status: Home / Self Care | Payer: Medicare Other | Source: Ambulatory Visit | Attending: Ophthalmology | Admitting: Ophthalmology

## 2017-01-20 ENCOUNTER — Ambulatory Visit: Payer: Medicare Other | Admitting: Anesthesiology

## 2017-01-20 ENCOUNTER — Encounter
Admission: RE | Disposition: A | Discharge status: Home / Self Care | Payer: Self-pay | Source: Ambulatory Visit | Attending: Ophthalmology

## 2017-01-20 DIAGNOSIS — I10 Essential (primary) hypertension: Secondary | ICD-10-CM | POA: Insufficient documentation

## 2017-01-20 DIAGNOSIS — Z79899 Other long term (current) drug therapy: Secondary | ICD-10-CM | POA: Insufficient documentation

## 2017-01-20 DIAGNOSIS — G4730 Sleep apnea, unspecified: Secondary | ICD-10-CM | POA: Insufficient documentation

## 2017-01-20 DIAGNOSIS — K219 Gastro-esophageal reflux disease without esophagitis: Secondary | ICD-10-CM | POA: Insufficient documentation

## 2017-01-20 DIAGNOSIS — Z87891 Personal history of nicotine dependence: Secondary | ICD-10-CM | POA: Insufficient documentation

## 2017-01-20 DIAGNOSIS — I2510 Atherosclerotic heart disease of native coronary artery without angina pectoris: Secondary | ICD-10-CM | POA: Diagnosis not present

## 2017-01-20 DIAGNOSIS — H2512 Age-related nuclear cataract, left eye: Secondary | ICD-10-CM | POA: Diagnosis not present

## 2017-01-20 HISTORY — PX: CATARACT EXTRACTION W/PHACO: SHX586

## 2017-01-20 SURGERY — PHACOEMULSIFICATION, CATARACT, WITH IOL INSERTION
Anesthesia: Monitor Anesthesia Care | Site: Eye | Laterality: Left | Wound class: Clean

## 2017-01-20 MED ORDER — NA HYALUR & NA CHOND-NA HYALUR 0.4-0.35 ML IO KIT
PACK | INTRAOCULAR | Status: DC | PRN
  Administered 2017-01-20: 1 mL via INTRAOCULAR

## 2017-01-20 MED ORDER — ARMC OPHTHALMIC DILATING DROPS
1.0000 "application " | OPHTHALMIC | Status: DC | PRN
  Administered 2017-01-20 (×3): 1 via OPHTHALMIC

## 2017-01-20 MED ORDER — LIDOCAINE HCL (PF) 2 % IJ SOLN
INTRAOCULAR | Status: DC | PRN
  Administered 2017-01-20: 2 mL via INTRAOCULAR

## 2017-01-20 MED ORDER — BRIMONIDINE TARTRATE-TIMOLOL 0.2-0.5 % OP SOLN
OPHTHALMIC | Status: DC | PRN
  Administered 2017-01-20: 1 [drp] via OPHTHALMIC

## 2017-01-20 MED ORDER — LACTATED RINGERS IV SOLN: INTRAVENOUS | Status: DC

## 2017-01-20 MED ORDER — MIDAZOLAM HCL 2 MG/2ML IJ SOLN
INTRAMUSCULAR | Status: DC | PRN
  Administered 2017-01-20: 2 mg via INTRAVENOUS

## 2017-01-20 MED ORDER — EPINEPHRINE PF 1 MG/ML IJ SOLN
INTRAMUSCULAR | Status: DC | PRN
  Administered 2017-01-20: 54 mL via OPHTHALMIC

## 2017-01-20 MED ORDER — FENTANYL CITRATE (PF) 100 MCG/2ML IJ SOLN
INTRAMUSCULAR | Status: DC | PRN
  Administered 2017-01-20: 50 ug via INTRAVENOUS

## 2017-01-20 MED ORDER — MOXIFLOXACIN HCL 0.5 % OP SOLN
1.0000 [drp] | OPHTHALMIC | Status: DC | PRN
  Administered 2017-01-20 (×3): 1 [drp] via OPHTHALMIC

## 2017-01-20 MED ORDER — CEFUROXIME OPHTHALMIC INJECTION 1 MG/0.1 ML
INJECTION | OPHTHALMIC | Status: DC | PRN
  Administered 2017-01-20: .3 mL via OPHTHALMIC

## 2017-01-20 SURGICAL SUPPLY — 27 items
CANNULA ANT/CHMB 27GA (MISCELLANEOUS) ×2 IMPLANT
CARTRIDGE ABBOTT (MISCELLANEOUS) IMPLANT
GLOVE SURG LX 7.5 STRW (GLOVE) ×1
GLOVE SURG LX STRL 7.5 STRW (GLOVE) ×1 IMPLANT
GLOVE SURG TRIUMPH 8.0 PF LTX (GLOVE) ×2 IMPLANT
GOWN STRL REUS W/ TWL LRG LVL3 (GOWN DISPOSABLE) ×2 IMPLANT
GOWN STRL REUS W/TWL LRG LVL3 (GOWN DISPOSABLE) ×2
LENS IOL ACRSF IQ TRC 3 24.5 (Intraocular Lens) ×1 IMPLANT
LENS IOL ACRYSOF IQ TORIC 24.5 (Intraocular Lens) ×1 IMPLANT
LENS IOL IQ TORIC 3 24.5 (Intraocular Lens) ×1 IMPLANT
MARKER SKIN DUAL TIP RULER LAB (MISCELLANEOUS) ×2 IMPLANT
NDL RETROBULBAR .5 NSTRL (NEEDLE) IMPLANT
NEEDLE FILTER BLUNT 18X 1/2SAF (NEEDLE) ×1
NEEDLE FILTER BLUNT 18X1 1/2 (NEEDLE) ×1 IMPLANT
PACK CATARACT BRASINGTON (MISCELLANEOUS) ×2 IMPLANT
PACK EYE AFTER SURG (MISCELLANEOUS) ×2 IMPLANT
PACK OPTHALMIC (MISCELLANEOUS) ×2 IMPLANT
RING MALYGIN 7.0 (MISCELLANEOUS) IMPLANT
SUT ETHILON 10-0 CS-B-6CS-B-6 (SUTURE)
SUT VICRYL  9 0 (SUTURE)
SUT VICRYL 9 0 (SUTURE) IMPLANT
SUTURE EHLN 10-0 CS-B-6CS-B-6 (SUTURE) IMPLANT
SYR 3ML LL SCALE MARK (SYRINGE) ×2 IMPLANT
SYR 5ML LL (SYRINGE) ×2 IMPLANT
SYR TB 1ML LUER SLIP (SYRINGE) ×2 IMPLANT
WATER STERILE IRR 250ML POUR (IV SOLUTION) ×2 IMPLANT
WIPE NON LINTING 3.25X3.25 (MISCELLANEOUS) ×2 IMPLANT

## 2017-01-20 NOTE — Transfer of Care (Signed)
Immediate Anesthesia Transfer of Care Note  Patient: Leah Huber  Procedure(s) Performed: Procedure(s) with comments: CATARACT EXTRACTION PHACO AND INTRAOCULAR LENS PLACEMENT (IOC)  Left Toric (Left) - toric lens  Patient Location: PACU  Anesthesia Type: MAC  Level of Consciousness: awake, alert  and patient cooperative  Airway and Oxygen Therapy: Patient Spontanous Breathing and Patient connected to supplemental oxygen  Post-op Assessment: Post-op Vital signs reviewed, Patient's Cardiovascular Status Stable, Respiratory Function Stable, Patent Airway and No signs of Nausea or vomiting  Post-op Vital Signs: Reviewed and stable  Complications: No apparent anesthesia complications

## 2017-01-20 NOTE — H&P (Signed)
The History and Physical notes are on paper, have been signed, and are to be scanned. The patient remains stable and unchanged from the H&P.   Previous H&P reviewed, patient examined, and there are no changes.  Phillip Maffei 01/20/2017 8:46 AM

## 2017-01-20 NOTE — Anesthesia Procedure Notes (Signed)
Performed by: Sarabella Caprio Pre-anesthesia Checklist: Patient identified, Emergency Drugs available, Suction available, Timeout performed and Patient being monitored Patient Re-evaluated:Patient Re-evaluated prior to inductionOxygen Delivery Method: Circle system utilized Preoxygenation: Pre-oxygenation with 100% oxygen Intubation Type: Inhalational induction Ventilation: Mask ventilation without difficulty and Mask ventilation throughout procedure Dental Injury: Teeth and Oropharynx as per pre-operative assessment        

## 2017-01-20 NOTE — Op Note (Signed)
LOCATION:  [place]   PREOPERATIVE DIAGNOSIS:  Nuclear sclerotic cataract of the left eye.  H25.12  POSTOPERATIVE DIAGNOSIS:  Nuclear sclerotic cataract of the left eye.   PROCEDURE:  Phacoemulsification with Toric posterior chamber intraocular lens placement of the left eye.   LENS:  Implant Name Type Inv. Item Serial No. Manufacturer Lot No. LRB No. Used  AcrySof IQ Toric Astigmatism IOL 24.5D Intraocular Lens   [id number] ALCON   Left 1   [id number] Toric intraocular lens with 1.5 diopters of cylindrical power with axis orientation at 30 degrees.   ULTRASOUND TIME: 17 % of 1 minutes, 24 seconds.  CDE 13.9   SURGEON:  Wyonia Hough, MD   ANESTHESIA:  Topical with tetracaine drops and 2% Xylocaine jelly, augmented with 1% preservative-free intracameral lidocaine.  COMPLICATIONS:  None.   DESCRIPTION OF PROCEDURE:  The patient was identified in the holding room and transported to the operating suite and placed in the supine position under the operating microscope.  The left eye was identified as the operative eye, and it was prepped and draped in the usual sterile ophthalmic fashion.    A clear-corneal paracentesis incision was made at the 1:30 position.  0.5 ml of preservative-free 1% lidocaine was injected into the anterior chamber. The anterior chamber was filled with Viscoat.  A 2.4 millimeter near clear corneal incision was then made at the 10:30 position.  A cystotome and capsulorrhexis forceps were then used to make a curvilinear capsulorrhexis.  Hydrodissection and hydrodelineation were then performed using balanced salt solution.   Phacoemulsification was then used in stop and chop fashion to remove the lens, nucleus and epinucleus.  The remaining cortex was aspirated using the irrigation and aspiration handpiece.  Provisc viscoelastic was then placed into the capsular bag to distend it for lens placement.  The Verion digital marker was used to align the implant  at the intended axis.   A 24.5 diopter lens was then injected into the capsular bag.  It was rotated clockwise until the axis marks on the lens were approximately 15 degrees in the counterclockwise direction to the intended alignment.  The viscoelastic was aspirated from the eye using the irrigation aspiration handpiece.  Then, a Koch spatula through the sideport incision was used to rotate the lens in a clockwise direction until the axis markings of the intraocular lens were lined up with the Verion alignment.  Balanced salt solution was then used to hydrate the wounds. Cefuroxime 0.1 ml of a 10mg /ml solution was injected into the anterior chamber for a dose of 1 mg of intracameral antibiotic at the completion of the case.    The eye was noted to have a physiologic pressure and there was no wound leak noted.   Timolol and Brimonidine drops were applied to the eye.  The patient was taken to the recovery room in stable condition having had no complications of anesthesia or surgery.  Rylee Huestis 01/20/2017, 9:43 AM

## 2017-01-20 NOTE — Anesthesia Postprocedure Evaluation (Signed)
Anesthesia Post Note  Patient: Leah Huber  Procedure(s) Performed: Procedure(s) (LRB): CATARACT EXTRACTION PHACO AND INTRAOCULAR LENS PLACEMENT (IOC)  Left Toric (Left)  Patient location during evaluation: PACU Anesthesia Type: MAC Level of consciousness: awake and alert and oriented Pain management: satisfactory to patient Vital Signs Assessment: post-procedure vital signs reviewed and stable Respiratory status: spontaneous breathing, nonlabored ventilation and respiratory function stable Cardiovascular status: blood pressure returned to baseline and stable Postop Assessment: Adequate PO intake and No signs of nausea or vomiting Anesthetic complications: no    Raliegh Ip

## 2017-01-20 NOTE — Anesthesia Preprocedure Evaluation (Signed)
Anesthesia Evaluation  Patient identified by MRN, date of birth, ID band Patient awake    Reviewed: Allergy & Precautions, H&P , NPO status , Patient's Chart, lab work & pertinent test results  Airway Mallampati: II  TM Distance: >3 FB Neck ROM: full    Dental no notable dental hx.    Pulmonary sleep apnea , former smoker,    Pulmonary exam normal        Cardiovascular hypertension, On Medications + CAD  Normal cardiovascular exam     Neuro/Psych    GI/Hepatic Neg liver ROS, GERD  ,  Endo/Other    Renal/GU negative Renal ROS     Musculoskeletal   Abdominal   Peds  Hematology negative hematology ROS (+)   Anesthesia Other Findings   Reproductive/Obstetrics negative OB ROS                             Anesthesia Physical  Anesthesia Plan  ASA: II  Anesthesia Plan: MAC   Post-op Pain Management:    Induction:   Airway Management Planned:   Additional Equipment:   Intra-op Plan:   Post-operative Plan:   Informed Consent: I have reviewed the patients History and Physical, chart, labs and discussed the procedure including the risks, benefits and alternatives for the proposed anesthesia with the patient or authorized representative who has indicated his/her understanding and acceptance.     Plan Discussed with:   Anesthesia Plan Comments:         Anesthesia Quick Evaluation

## 2017-01-21 ENCOUNTER — Encounter: Payer: Self-pay | Admitting: Ophthalmology

## 2017-03-04 ENCOUNTER — Emergency Department
Admission: EM | Admit: 2017-03-04 | Discharge: 2017-03-04 | Disposition: A | Discharge status: Home / Self Care | Payer: Medicare Other | Attending: Emergency Medicine | Admitting: Emergency Medicine

## 2017-03-04 ENCOUNTER — Emergency Department: Payer: Medicare Other

## 2017-03-04 ENCOUNTER — Encounter: Payer: Self-pay | Admitting: Emergency Medicine

## 2017-03-04 DIAGNOSIS — S43004A Unspecified dislocation of right shoulder joint, initial encounter: Secondary | ICD-10-CM | POA: Diagnosis not present

## 2017-03-04 DIAGNOSIS — W010XXA Fall on same level from slipping, tripping and stumbling without subsequent striking against object, initial encounter: Secondary | ICD-10-CM | POA: Insufficient documentation

## 2017-03-04 DIAGNOSIS — Y93E5 Activity, floor mopping and cleaning: Secondary | ICD-10-CM | POA: Insufficient documentation

## 2017-03-04 DIAGNOSIS — Y929 Unspecified place or not applicable: Secondary | ICD-10-CM | POA: Insufficient documentation

## 2017-03-04 DIAGNOSIS — I10 Essential (primary) hypertension: Secondary | ICD-10-CM | POA: Insufficient documentation

## 2017-03-04 DIAGNOSIS — I2510 Atherosclerotic heart disease of native coronary artery without angina pectoris: Secondary | ICD-10-CM | POA: Insufficient documentation

## 2017-03-04 DIAGNOSIS — W19XXXA Unspecified fall, initial encounter: Secondary | ICD-10-CM | POA: Diagnosis not present

## 2017-03-04 DIAGNOSIS — Z96653 Presence of artificial knee joint, bilateral: Secondary | ICD-10-CM | POA: Insufficient documentation

## 2017-03-04 DIAGNOSIS — Z87891 Personal history of nicotine dependence: Secondary | ICD-10-CM | POA: Diagnosis not present

## 2017-03-04 DIAGNOSIS — S0990XA Unspecified injury of head, initial encounter: Secondary | ICD-10-CM | POA: Diagnosis not present

## 2017-03-04 DIAGNOSIS — M25511 Pain in right shoulder: Secondary | ICD-10-CM | POA: Diagnosis not present

## 2017-03-04 DIAGNOSIS — R51 Headache: Secondary | ICD-10-CM | POA: Insufficient documentation

## 2017-03-04 DIAGNOSIS — Y999 Unspecified external cause status: Secondary | ICD-10-CM | POA: Insufficient documentation

## 2017-03-04 DIAGNOSIS — S4991XA Unspecified injury of right shoulder and upper arm, initial encounter: Secondary | ICD-10-CM | POA: Diagnosis present

## 2017-03-04 MED ORDER — ETOMIDATE 2 MG/ML IV SOLN
INTRAVENOUS | Status: AC | PRN
  Administered 2017-03-04: 3 mg via INTRAVENOUS

## 2017-03-04 MED ORDER — LIDOCAINE HCL (PF) 1 % IJ SOLN
10.0000 mL | Freq: Once | INTRAMUSCULAR | Status: DC
  Filled 2017-03-04: qty 10

## 2017-03-04 MED ORDER — FENTANYL CITRATE (PF) 100 MCG/2ML IJ SOLN
75.0000 ug | Freq: Once | INTRAMUSCULAR | Status: AC
  Administered 2017-03-04: 75 ug via INTRAVENOUS
  Filled 2017-03-04: qty 2

## 2017-03-04 MED ORDER — FENTANYL CITRATE (PF) 100 MCG/2ML IJ SOLN
50.0000 ug | Freq: Once | INTRAMUSCULAR | Status: AC
  Administered 2017-03-04: 50 ug via INTRAVENOUS
  Filled 2017-03-04: qty 2

## 2017-03-04 MED ORDER — FENTANYL CITRATE (PF) 100 MCG/2ML IJ SOLN
INTRAMUSCULAR | Status: AC
  Filled 2017-03-04: qty 2

## 2017-03-04 MED ORDER — FENTANYL CITRATE (PF) 100 MCG/2ML IJ SOLN
INTRAMUSCULAR | Status: AC
  Administered 2017-03-04: 75 ug via INTRAVENOUS
  Filled 2017-03-04: qty 2

## 2017-03-04 MED ORDER — ETOMIDATE 2 MG/ML IV SOLN
0.1000 mg/kg | Freq: Once | INTRAVENOUS | Status: AC
  Administered 2017-03-04: 7.18 mg via INTRAVENOUS
  Filled 2017-03-04: qty 10

## 2017-03-04 NOTE — ED Notes (Signed)
X-ray at bedside

## 2017-03-04 NOTE — ED Notes (Signed)
Sedation discharge paperwork went over with pt and family

## 2017-03-04 NOTE — Sedation Documentation (Signed)
Pt communicating, eyes open.

## 2017-03-04 NOTE — ED Triage Notes (Addendum)
Pt here from home via [facility] after mechanical fall, slipping while mopping floor. Pt reports right shoulder pain, denies hitting head, denies LOC. EMS reports giving 28mcg of fentanyl en route.

## 2017-03-04 NOTE — ED Provider Notes (Signed)
[facility] Emergency Department Provider Note  ____________________________________________  Time seen: Approximately 9:13 AM  I have reviewed the triage vital signs and the nursing notes.   HISTORY  Chief Complaint Shoulder Injury   HPI Leah Huber is a 76 y.o. female with a significant past medical history as listed below who presents for evaluation of right shoulder pain status post mechanical fall. Patient was mopping the floor and slipped and fell onto her right shoulder. She is complaining of severe constant pain in her right shoulder radiating to her elbow since the fall. She denies any other injuries. No head injury. No LOC. Denies neck pain, back pain, chest pain, abdominal pain, or any other extremity pain. Patient received 75 g of fentanyl in route per EMS. She is not on blood thinners.   Past Medical History:  Diagnosis Date  . Anemia   . Aortic aneurysm (HCC)    "SMALL"  . Arthritis    neck to tailbone  . Bronchitis   . Chronic cough    @ NIGHT/ SEASONAL ALLERGIES  . Clostridium difficile infection 2013-2014  . Colon polyp   . Coronary artery disease    Dr Nehemiah Massed cardiologist  . Depression   . Diverticulosis   . GERD (gastroesophageal reflux disease)   . Glaucoma   . Hypercholesterolemia   . Hypertension    CONTROLLED ON MEDS  . Hypovolemia 12/10   acute renal failure  . IBS (irritable bowel syndrome)   . Neuromuscular disorder ([place])    occasionally tingling in hands/ NUMBNESS IN RIGHT FOOT s/p back surgery  . Osteoarthritis    bilateral knees  . Osteopenia   . Pneumonia    HX OF  . Sleep apnea    very mild   sleep study 8 yrs ago does not use CPAP  . Urinary incontinence     Patient Active Problem List   Diagnosis Date Noted  . Cervical spondylosis with radiculopathy 11/28/2014  . Aortic aneurysm ([place]) 11/04/2014  . Fullness of breast 11/04/2014  . Health care maintenance 11/04/2014  . Neck pain 08/02/2014    . Cough 04/08/2014  . Abnormal CXR 04/08/2014  . URI (upper respiratory infection) 04/01/2014  . History of colonic polyps 01/31/2014  . Stress 12/02/2013  . Fall 10/22/2013  . Alcohol abuse 10/22/2013  . Hypoxia 06/27/2013  . Glaucoma 03/31/2013  . Anemia 07/28/2012  . Urinary incontinence 07/28/2012  . Hypertension 06/08/2012  . Hypercholesterolemia 06/08/2012  . Arthritis 06/08/2012  . GERD (gastroesophageal reflux disease) 06/08/2012    Past Surgical History:  Procedure Laterality Date  . ABDOMINAL HYSTERECTOMY  1979  . ANTERIOR CERVICAL DECOMP/DISCECTOMY FUSION N/A 11/28/2014   Procedure: CERVICAL FIVE-SIX  ANTERIOR CERVICAL DECOMPRESSION/DISCECTOMY FUSION 1 LEVEL;  Surgeon: Newman Pies, MD;  Location: [place] [facility];  Service: Neurosurgery;  Laterality: N/A;  C56 anterior cervical decompression with fusion interbody prosthesis plating and bonegraft  . BACK SURGERY     X2/ L4,5 & S1/ Cervical C7?  . BLADDER SURGERY  1981   SUSPENSION  . BREAST REDUCTION SURGERY    . CARPAL TUNNEL RELEASE  2007  . CATARACT EXTRACTION W/PHACO Right 12/30/2016   Procedure: CATARACT EXTRACTION PHACO AND INTRAOCULAR LENS PLACEMENT (IOC)  Right toric lens;  Surgeon: Leandrew Koyanagi, MD;  Location: [facility];  Service: Ophthalmology;  Laterality: Right;  sleep apnea, does not use CPAP  . CATARACT EXTRACTION W/PHACO Left 01/20/2017   Procedure: CATARACT EXTRACTION PHACO AND INTRAOCULAR LENS PLACEMENT (IOC)  Left Toric;  Surgeon: Leandrew Koyanagi, MD;  Location: [facility];  Service: Ophthalmology;  Laterality: Left;  toric lens  . CHOLECYSTECTOMY  1996  . COLONOSCOPY    . FOOT SURGERY  2005  . [place]  . REDUCTION MAMMAPLASTY Bilateral 2001  . [place] & 2004   left and right  . SPINAL CORD STIMULATOR IMPLANT  03/1997  . SPINAL CORD STIMULATOR REMOVAL  2007  . Tendon repair right shoulder    . TOTAL KNEE ARTHROPLASTY  10/2011 & 09/13    left and right    Prior to Admission medications   Medication Sig Start Date End Date Taking? Authorizing Provider  latanoprost (XALATAN) 0.005 % ophthalmic solution Place 1 drop into both eyes at bedtime.  09/17/15  Yes [provider]  pantoprazole (PROTONIX) 40 MG tablet TAKE 1 TABLET TWICE A DAY Patient taking differently: TAKE 1 TABLET TWICE A DAY/ AM AND PM 11/04/16  Yes Einar Pheasant, MD  rosuvastatin (CRESTOR) 20 MG tablet TAKE 1 TABLET DAILY Patient taking differently: TAKE 1 TABLET DAILY/PM 09/30/16  Yes Leone Haven, MD  tolterodine (DETROL LA) 4 MG 24 hr capsule TAKE 1 CAPSULE DAILY Patient taking differently: TAKE 1 CAPSULE DAILY/AM 09/30/16  Yes Einar Pheasant, MD  valsartan (DIOVAN) 80 MG tablet TAKE 1 TABLET DAILY Patient taking differently: TAKE 1 TABLET DAILY/ AM 09/30/16  Yes Leone Haven, MD  escitalopram (LEXAPRO) 10 MG tablet Take 1 tablet (10 mg total) by mouth daily. Patient not taking: Reported on 12/24/2016 07/27/16   Einar Pheasant, MD  predniSONE (DELTASONE) 10 MG tablet Take 4 tablets x 1 day and then decrease by 1/2 tablet per day until down to zero mg. Patient not taking: Reported on 12/24/2016 09/18/16   Einar Pheasant, MD    Allergies Clindamycin/lincomycin; Bactrim [sulfamethoxazole-trimethoprim]; Morphine; and Sulfa antibiotics  Family History  Problem Relation Age of Onset  . Heart disease Mother   . Stroke Mother   . Hypertension Mother   . Hyperlipidemia Father   . Heart disease Father   . Heart disease Brother        2 brothers  . Alzheimer's disease Brother   . Heart disease Sister        pacemaker  . Breast cancer Daughter 75    Social History Social History  Substance Use Topics  . Smoking status: Former Smoker    Packs/day: 1.00    Years: 20.00    Types: Cigarettes    Quit date: 09/07/1988  . Smokeless tobacco: Never Used  . Alcohol use 1.2 oz/week    2 Glasses of wine per week     Comment: occasional     Review of Systems Constitutional: Negative for fever. Eyes: Negative for visual changes. ENT: Negative for facial injury or neck injury Cardiovascular: Negative for chest injury. Respiratory: Negative for shortness of breath. Negative for chest wall injury. Gastrointestinal: Negative for abdominal pain or injury. Genitourinary: Negative for dysuria. Musculoskeletal: Negative for back injury. + R shoulder pain Skin: Negative for laceration/abrasions. Neurological: Negative for head injury.  ___________________________________________   PHYSICAL EXAM:  VITAL SIGNS: ED Triage Vitals  Enc Vitals Group     BP      Pulse      Resp      Temp      Temp src      SpO2      Weight      Height  Head Circumference      Peak Flow      Pain Score      Pain Loc      Pain Edu?      Excl. in [place]?    Constitutional: Alert and oriented. No acute distress. Does not appear intoxicated. HEENT Head: Normocephalic and atraumatic. Face: No facial bony tenderness. Stable midface Ears: No hemotympanum bilaterally. No Battle sign Eyes: No eye injury. PERRL. No raccoon eyes Nose: Nontender. No epistaxis. No rhinorrhea Mouth/Throat: Mucous membranes are moist. No oropharyngeal blood. No dental injury. Airway patent without stridor. Normal voice. Neck: no C-collar in place. No midline c-spine tenderness.  Cardiovascular: Normal rate, regular rhythm. Normal and symmetric distal pulses are present in all extremities. Pulmonary/Chest: Chest wall is stable and nontender to palpation/compression. Normal respiratory effort. Breath sounds are normal. No crepitus.  Abdominal: Soft, nontender, non distended. Musculoskeletal: Obvious Deformity to the right proximal humeral head. Limited range of motion. Intact neurovascular exam of the right upper extremity. Nontender with normal full range of motion in all other extremities. No deformities. No thoracic or lumbar midline spinal tenderness. Pelvis is  stable. Skin: Skin is warm, dry and intact. No abrasions or contutions. Psychiatric: Speech and behavior are appropriate. Neurological: Normal speech and language. Moves all extremities to command. No gross focal neurologic deficits are appreciated.  Glascow Coma Score: 4 - Opens eyes on own 6 - Follows simple motor commands 5 - Alert and oriented GCS: 15  ____________________________________________   LABS (all labs ordered are listed, but only abnormal results are displayed)  Labs Reviewed - No data to display ____________________________________________  EKG  none  ____________________________________________  RADIOLOGY  CT head: negative   XR R shoulder: Anterior subcoracoid dislocation of the humeral head. No definite fracture. ____________________________________________   PROCEDURES  Procedure(s) performed:yes .Sedation Date/Time: 03/04/2017 12:19 PM Performed by: Rudene Re Authorized by: Rudene Re   Consent:    Consent obtained:  Written   Consent given by:  Patient   Risks discussed:  Allergic reaction, dysrhythmia, inadequate sedation, nausea, prolonged hypoxia resulting in organ damage, prolonged sedation necessitating reversal, respiratory compromise necessitating ventilatory assistance and intubation and vomiting   Alternatives discussed:  Analgesia without sedation Indications:    Procedure performed:  Dislocation reduction   Procedure necessitating sedation performed by:  Physician performing sedation   Intended level of sedation:  Moderate (conscious sedation) Pre-sedation assessment:    Time since last food or drink:  8   ASA classification: class 3 - patient with severe systemic disease     Neck mobility: normal     Mouth opening:  3 or more finger widths   Thyromental distance:  4 finger widths   Mallampati score:  I - soft palate, uvula, fauces, pillars visible   Pre-sedation assessments completed and reviewed: airway  patency, cardiovascular function, hydration status, mental status, nausea/vomiting, pain level, respiratory function and temperature     History of difficult intubation: no     Pre-sedation assessment completed:  03/04/2017 11:00 AM Immediate pre-procedure details:    Reassessment: Patient reassessed immediately prior to procedure     Reviewed: vital signs     Verified: bag valve mask available, emergency equipment available, intubation equipment available, IV patency confirmed, oxygen available and suction available   Procedure details (see MAR for exact dosages):    Sedation start time:  03/04/2017 11:18 AM   Preoxygenation:  Room air   Sedation:  Etomidate   Analgesia:  Fentanyl   Intra-procedure monitoring:  Blood pressure monitoring, continuous capnometry, continuous pulse oximetry, cardiac monitor, frequent LOC assessments and frequent vital sign checks   Intra-procedure events: none     Sedation end time:  03/04/2017 11:30 AM   Total sedation time (minutes):  12 Post-procedure details:    Post-sedation assessment completed:  03/04/2017 12:20 PM   Attendance: Constant attendance by certified staff until patient recovered     Recovery: Patient returned to pre-procedure baseline     Post-sedation assessments completed and reviewed: airway patency, cardiovascular function, hydration status, mental status, nausea/vomiting, pain level, respiratory function and temperature     Patient is stable for discharge or admission: yes     Patient tolerance:  Tolerated well, no immediate complications    Reduction of dislocation Date/Time: 12:22 PM Performed by: [place] by: Rudene Re Consent: Verbal consent obtained. Risks and benefits: risks, benefits and alternatives were discussed Consent given by: patient Required items: required blood products, implants, devices, and special equipment available Time out: Immediately prior to procedure a "time out" was called to  verify the correct patient, procedure, equipment, support staff and site/side marked as required.  Patient sedated: yes, see note above  Vitals: Vital signs were monitored during sedation. Patient tolerance: Patient tolerated the procedure well with no immediate complications. Joint: R shoulder Reduction technique: traction countertraction    Critical Care performed:  None ____________________________________________   INITIAL IMPRESSION / ASSESSMENT AND PLAN / ED COURSE   76 y.o. female with a significant past medical history as listed below who presents for evaluation of right shoulder pain status post mechanical fall. Patient with obvious deformity of the right shoulder concerning for dislocation versus fracture. Right upper extremity is neurovascularly intact otherwise. Since patient is unsure if she hit her head and she has a distracting injury, head CT will be done. No signs or symptoms of basilar skull fracture. No other injuries on physical exam and based on history.    _________________________ 12:21 PM on 03/04/2017 -----------------------------------------  X-ray concerning for anterior shoulder dislocation. An attempt was made to reduce this shoulder with a hematoma block and pain medication which was unsuccessful. Patient was then sedated per procedure note above with successful reduction of her dislocation. Patient is back to her baseline. Patient be discharged home with follow-up with orthopedics in a week. Patient was placed into a shoulder immobilizer.  Pertinent labs & imaging results that were available during my care of the patient were reviewed by me and considered in my medical decision making (see chart for details).    ____________________________________________   FINAL CLINICAL IMPRESSION(S) / ED DIAGNOSES  Final diagnoses:  Fall, initial encounter  Shoulder dislocation, right, initial encounter      NEW MEDICATIONS STARTED DURING THIS VISIT:  New  Prescriptions   No medications on file     Note:  This document was prepared using Dragon voice recognition software and may include unintentional dictation errors.    Alfred Levins, [place], MD 03/04/17 [phone]

## 2017-03-04 NOTE — Sedation Documentation (Signed)
MD Alfred Levins, MD Nathanial Millman RNSolmon Ice , NT  at bedside

## 2017-03-04 NOTE — Sedation Documentation (Signed)
Pt A&Ox4, speech clear, pt awake and talking to family, VS stable. Pt at baseline at this time, pt rates pain 1 out of 10

## 2017-03-04 NOTE — Sedation Documentation (Signed)
Procedure complete, arm is back in place at this time

## 2017-03-11 DIAGNOSIS — S43014A Anterior dislocation of right humerus, initial encounter: Secondary | ICD-10-CM | POA: Diagnosis not present

## 2017-03-16 ENCOUNTER — Other Ambulatory Visit: Payer: Medicare Other

## 2017-03-18 ENCOUNTER — Encounter: Payer: Self-pay | Admitting: Internal Medicine

## 2017-03-18 ENCOUNTER — Ambulatory Visit (INDEPENDENT_AMBULATORY_CARE_PROVIDER_SITE_OTHER): Payer: Medicare Other | Admitting: Internal Medicine

## 2017-03-18 VITALS — BP 138/76 | HR 84 | Temp 98.6°F | Resp 12 | Ht 64.0 in | Wt 160.0 lb

## 2017-03-18 DIAGNOSIS — M7581 Other shoulder lesions, right shoulder: Secondary | ICD-10-CM | POA: Diagnosis not present

## 2017-03-18 DIAGNOSIS — E7800 Pure hypercholesterolemia, unspecified: Secondary | ICD-10-CM

## 2017-03-18 DIAGNOSIS — W19XXXD Unspecified fall, subsequent encounter: Secondary | ICD-10-CM | POA: Diagnosis not present

## 2017-03-18 DIAGNOSIS — R739 Hyperglycemia, unspecified: Secondary | ICD-10-CM

## 2017-03-18 DIAGNOSIS — I712 Thoracic aortic aneurysm, without rupture, unspecified: Secondary | ICD-10-CM

## 2017-03-18 DIAGNOSIS — K219 Gastro-esophageal reflux disease without esophagitis: Secondary | ICD-10-CM | POA: Diagnosis not present

## 2017-03-18 DIAGNOSIS — I10 Essential (primary) hypertension: Secondary | ICD-10-CM

## 2017-03-18 DIAGNOSIS — Z23 Encounter for immunization: Secondary | ICD-10-CM

## 2017-03-18 DIAGNOSIS — S43014A Anterior dislocation of right humerus, initial encounter: Secondary | ICD-10-CM | POA: Diagnosis not present

## 2017-03-18 MED ORDER — ZOSTER VAC RECOMB ADJUVANTED 50 MCG/0.5ML IM SUSR: 0.5000 mL | Freq: Once | INTRAMUSCULAR | 0 refills | Status: AC

## 2017-03-18 NOTE — Progress Notes (Signed)
Pre-visit discussion using our clinic review tool. No additional management support is needed unless otherwise documented below in the visit note.  

## 2017-03-18 NOTE — Progress Notes (Signed)
Patient ID: Leah Huber, female   DOB: 1941/01/01, 76 y.o.   MRN: [id number]   Subjective:    Patient ID: Leah Huber, female    DOB: 20-Sep-1940, 76 y.o.   MRN: [id number]  HPI  Patient here for a scheduled follow up.  Was seen in ER 03/04/17 s/p fall.  Found to have shoulder dislocation.   Was sedated and had reduction of her dislocation.  Has been seeing ortho.  Sling off today.  Doing better.  Planning for exercises now.  States otherwise doing well.  Is s/p cataract extraction.  No chest pain.  No sob.  No acid reflux.  No abdominal pain or cramping.  Bowels stable.     Past Medical History:  Diagnosis Date  . Anemia   . Aortic aneurysm (HCC)    "SMALL"  . Arthritis    neck to tailbone  . Bronchitis   . Chronic cough    @ NIGHT/ SEASONAL ALLERGIES  . Clostridium difficile infection 2013-2014  . Colon polyp   . Coronary artery disease    Dr Nehemiah Massed cardiologist  . Depression   . Diverticulosis   . GERD (gastroesophageal reflux disease)   . Glaucoma   . Hypercholesterolemia   . Hypertension    CONTROLLED ON MEDS  . Hypovolemia 12/10   acute renal failure  . IBS (irritable bowel syndrome)   . Neuromuscular disorder ([place])    occasionally tingling in hands/ NUMBNESS IN RIGHT FOOT s/p back surgery  . Osteoarthritis    bilateral knees  . Osteopenia   . Pneumonia    HX OF  . Sleep apnea    very mild   sleep study 8 yrs ago does not use CPAP  . Urinary incontinence    Past Surgical History:  Procedure Laterality Date  . ABDOMINAL HYSTERECTOMY  1979  . ANTERIOR CERVICAL DECOMP/DISCECTOMY FUSION N/A 11/28/2014   Procedure: CERVICAL FIVE-SIX  ANTERIOR CERVICAL DECOMPRESSION/DISCECTOMY FUSION 1 LEVEL;  Surgeon: Newman Pies, MD;  Location: [facility];  Service: Neurosurgery;  Laterality: N/A;  C56 anterior cervical decompression with fusion interbody prosthesis plating and bonegraft  . BACK SURGERY     X2/ L4,5 & S1/ Cervical C7?  . BLADDER SURGERY  1981   SUSPENSION  . BREAST REDUCTION SURGERY    . CARPAL TUNNEL RELEASE  2007  . CATARACT EXTRACTION W/PHACO Right 12/30/2016   Procedure: CATARACT EXTRACTION PHACO AND INTRAOCULAR LENS PLACEMENT (I[facility])  Right toric lens;  Surgeon: Leandrew Koyanagi, MD;  Location: [facility];  Service: Ophthalmology;  Laterality: Right;  sleep apnea, does not use CPAP  . CATARACT EXTRACTION W/PHACO Left 01/20/2017   Procedure: CATARACT EXTRACTION PHACO AND INTRAOCULAR LENS PLACEMENT ([place])  Left Toric;  Surgeon: Leandrew Koyanagi, MD;  Location: [facility];  Service: Ophthalmology;  Laterality: Left;  toric lens  . CHOLECYSTECTOMY  1996  . COLONOSCOPY    . FOOT SURGERY  2005  . [place]  . REDUCTION MAMMAPLASTY Bilateral 2001  . [place] & 2004   left and right  . SPINAL CORD STIMULATOR IMPLANT  03/1997  . SPINAL CORD STIMULATOR REMOVAL  2007  . Tendon repair right shoulder    . TOTAL KNEE ARTHROPLASTY  10/2011 & 09/13   left and right   Family History  Problem Relation Age of Onset  . Heart disease Mother   . Stroke Mother   . Hypertension Mother   . Hyperlipidemia Father   .  Heart disease Father   . Heart disease Brother        2 brothers  . Alzheimer's disease Brother   . Heart disease Sister        pacemaker  . Breast cancer Daughter 30   Social History   Social History  . Marital status: Widowed    Spouse name: N/A  . Number of children: 2  . Years of education: N/A   Social History Main Topics  . Smoking status: Former Smoker    Packs/day: 1.00    Years: 20.00    Types: Cigarettes    Quit date: 09/07/1988  . Smokeless tobacco: Never Used  . Alcohol use 1.2 oz/week    2 Glasses of wine per week     Comment: occasional  . Drug use: No  . Sexual activity: No   Other Topics Concern  . None   Social History Narrative   She is widowed, has two daughters.    Outpatient Encounter Prescriptions as of 03/18/2017  Medication Sig  .  latanoprost (XALATAN) 0.005 % ophthalmic solution Place 1 drop into both eyes at bedtime.   . pantoprazole (PROTONIX) 40 MG tablet TAKE 1 TABLET TWICE A DAY (Patient taking differently: TAKE 1 TABLET TWICE A DAY/ AM AND PM)  . rosuvastatin (CRESTOR) 20 MG tablet TAKE 1 TABLET DAILY (Patient taking differently: TAKE 1 TABLET DAILY/PM)  . tolterodine (DETROL LA) 4 MG 24 hr capsule TAKE 1 CAPSULE DAILY (Patient taking differently: TAKE 1 CAPSULE DAILY/AM)  . valsartan (DIOVAN) 80 MG tablet TAKE 1 TABLET DAILY (Patient taking differently: TAKE 1 TABLET DAILY/ AM)  . [EXPIRED] Zoster Vac Recomb Adjuvanted [facility]) injection Inject 0.5 mLs into the muscle once.  . [DISCONTINUED] escitalopram (LEXAPRO) 10 MG tablet Take 1 tablet (10 mg total) by mouth daily. (Patient not taking: Reported on 03/18/2017)  . [DISCONTINUED] predniSONE (DELTASONE) 10 MG tablet Take 4 tablets x 1 day and then decrease by 1/2 tablet per day until down to zero mg. (Patient not taking: Reported on 12/24/2016)   No facility-administered encounter medications on file as of 03/18/2017.     Review of Systems  Constitutional: Negative for appetite change and unexpected weight change.  HENT: Negative for congestion and sinus pressure.   Respiratory: Negative for cough, chest tightness and shortness of breath.   Cardiovascular: Negative for chest pain, palpitations and leg swelling.  Gastrointestinal: Negative for abdominal pain, diarrhea, nausea and vomiting.  Genitourinary: Negative for difficulty urinating and dysuria.  Musculoskeletal: Negative for joint swelling and myalgias.       Recent should dislocation.  Still with pain.   Skin: Negative for color change and rash.  Neurological: Negative for dizziness, light-headedness and headaches.  Psychiatric/Behavioral: Negative for agitation and dysphoric mood.       Objective:    Physical Exam  Constitutional: She appears well-developed and well-nourished. No distress.  HENT:   Nose: Nose normal.  Mouth/Throat: Oropharynx is clear and moist.  Neck: Neck supple. No thyromegaly present.  Cardiovascular: Normal rate and regular rhythm.   Pulmonary/Chest: Breath sounds normal. No respiratory distress. She has no wheezes.  Abdominal: Soft. Bowel sounds are normal. There is no tenderness.  Musculoskeletal: She exhibits no edema or tenderness.  Lymphadenopathy:    She has no cervical adenopathy.  Skin: No rash noted. No erythema.  Psychiatric: She has a normal mood and affect. Her behavior is normal.    BP 138/76 (BP Location: Left Arm, Patient Position: Sitting, Cuff Size: Normal)  Pulse 84   Temp 98.6 F (37 C) (Oral)   Resp 12   Ht 5\' 4"  (1.626 m)   Wt 160 lb (72.6 kg)   LMP 08/15/1978   SpO2 98%   BMI 27.46 kg/m  Wt Readings from Last 3 Encounters:  03/18/17 160 lb (72.6 kg)  03/04/17 158 lb (71.7 kg)  01/20/17 163 lb (73.9 kg)     Lab Results  Component Value Date   WBC 6.1 02/20/2016   HGB 13.1 02/20/2016   HCT 38.5 02/20/2016   PLT 306.0 02/20/2016   GLUCOSE 105 (H) 07/27/2016   CHOL 188 07/27/2016   TRIG 122.0 07/27/2016   HDL 101.50 07/27/2016   LDLDIRECT 147.1 05/30/2013   LDLCALC 62 07/27/2016   ALT 16 07/27/2016   AST 22 07/27/2016   NA 140 07/27/2016   K 5.0 07/27/2016   CL 103 07/27/2016   CREATININE 0.85 07/27/2016   BUN 21 07/27/2016   CO2 29 07/27/2016   TSH 2.00 02/20/2016   INR 1.0 10/16/2013    Dg Shoulder Right  Result Date: 03/04/2017 CLINICAL DATA:  [place] this morning.  Right shoulder pain. EXAM: RIGHT SHOULDER - 2+ VIEW COMPARISON:  None. 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 No definite fracture. Surgical changes involving the right humeral head. The [facility] joint is intact. Moderate degenerative changes. The visualized right lung is clear. IMPRESSION: Anterior subcoracoid dislocation of the humeral head. No definite fracture. Electronically Signed   By: Marijo Sanes M.D.   On: 03/04/2017  10:06   Ct Head Wo Contrast  Result Date: 03/04/2017 CLINICAL DATA:  76 year old female with history of trauma from a fall (patient reporting slipped while mopping the floor) with injury to the head. Head pain. EXAM: CT HEAD WITHOUT CONTRAST TECHNIQUE: Contiguous axial images were obtained from the base of the skull through the vertex without intravenous contrast. COMPARISON:  Head CT 10/16/2013. FINDINGS: Brain: Moderate cerebral and mild cerebellar atrophy. Patchy and confluent areas of decreased attenuation are noted throughout the deep and periventricular white matter of the cerebral hemispheres bilaterally, compatible with chronic microvascular ischemic disease. No evidence of acute infarction, hemorrhage, hydrocephalus, extra-axial collection or mass lesion/mass effect. Vascular: No hyperdense vessel or unexpected calcification. Skull: Normal. Negative for fracture or focal lesion. Sinuses/Orbits: No acute finding. Other: None. IMPRESSION: 1. No evidence of significant acute traumatic injury to the skull or brain. 2. Moderate cerebral and mild cerebellar atrophy with extensive chronic microvascular ischemic changes in the cerebral white matter, similar to the prior examination. Electronically Signed   By: Vinnie Langton M.D.   On: 03/04/2017 09:41   Dg Shoulder Right Portable  Result Date: 03/04/2017 CLINICAL DATA:  RIGHT shoulder dislocation post reduction EXAM: PORTABLE RIGHT SHOULDER COMPARISON:  03/04/2017 FINDINGS: Previously identified RIGHT glenohumeral dislocation appears reduced. Bones demineralized. AC joint alignment normal. Suspect prior rotator cuff repair. No definite fractures identified. Visualized RIGHT ribs intact. IMPRESSION: Reduction of previously identified RIGHT glenohumeral dislocation. Electronically Signed   By: Lavonia Dana M.D.   On: 03/04/2017 12:02       Assessment & Plan:   Problem List Items Addressed This Visit    Aortic aneurysm ([place])    Followed by  cardiology.       Fall    S/p recent fall as outlined.  S/p dislocation of her shoulder.  S/p reduction.  Continues to f/u with ortho.  Exercise as instructed by ortho.       GERD (gastroesophageal reflux disease)  Controlled on protonix.  Follow.       Hypercholesterolemia    On crestor.  Low cholesterol diet and exercise.  Follow lipid panel and liver function tests.        Relevant Orders   Lipid panel   Hepatic function panel   Hypertension    Blood pressure under good control.  Continue same medication regimen.  Follow pressures.  Follow metabolic panel.        Relevant Orders   CBC with Differential/Platelet   TSH   Basic metabolic panel    Other Visit Diagnoses    Need for shingles vaccine    -  Primary   Need for Tdap vaccination       Need for pneumococcal vaccination       Hyperglycemia       Relevant Orders   Hemoglobin A1c       Einar Pheasant, MD

## 2017-03-20 ENCOUNTER — Encounter: Payer: Self-pay | Admitting: Internal Medicine

## 2017-03-20 NOTE — Assessment & Plan Note (Signed)
Followed by cardiology 

## 2017-03-20 NOTE — Assessment & Plan Note (Signed)
Controlled on protonix.  Follow.   

## 2017-03-20 NOTE — Assessment & Plan Note (Signed)
Blood pressure under good control.  Continue same medication regimen.  Follow pressures.  Follow metabolic panel.   

## 2017-03-20 NOTE — Assessment & Plan Note (Signed)
S/p recent fall as outlined.  S/p dislocation of her shoulder.  S/p reduction.  Continues to f/u with ortho.  Exercise as instructed by ortho.

## 2017-03-20 NOTE — Assessment & Plan Note (Signed)
On crestor.  Low cholesterol diet and exercise.  Follow lipid panel and liver function tests.   

## 2017-03-25 ENCOUNTER — Telehealth: Payer: Self-pay | Admitting: *Deleted

## 2017-03-25 NOTE — Telephone Encounter (Signed)
Patient has requested a medication replacement for valsartan  Pt contact [phone]

## 2017-03-25 NOTE — Telephone Encounter (Signed)
See note

## 2017-03-26 ENCOUNTER — Other Ambulatory Visit: Payer: Self-pay

## 2017-03-26 MED ORDER — LOSARTAN POTASSIUM 50 MG PO TABS: 50.0000 mg | ORAL_TABLET | Freq: Every day | ORAL | 0 refills | Status: DC

## 2017-03-26 NOTE — Telephone Encounter (Signed)
Pt currently on diovan (valsartan) 80mg  q day.  Have her change to losartan 50mg  q day.  Will need new rx sent in and medication list corrected.  Have her spot check her pressure and notify us of readings.  Will adjust dose if needed, but this should be equivalent dose to what she is currently taking.

## 2017-03-26 NOTE — Telephone Encounter (Signed)
Pt requested a call to discuss medication change [phone]

## 2017-03-26 NOTE — Telephone Encounter (Signed)
Left message to return call to our office.  

## 2017-03-26 NOTE — Telephone Encounter (Signed)
Patient called back she asked that we send to mail order it only takes a day or two to get it in the mail. She will call if any change of normal bp readings. She will keep log to bring at next o/v

## 2017-03-29 ENCOUNTER — Other Ambulatory Visit: Payer: Self-pay | Admitting: Internal Medicine

## 2017-03-29 ENCOUNTER — Other Ambulatory Visit: Payer: Self-pay | Admitting: Family Medicine

## 2017-04-05 DIAGNOSIS — I712 Thoracic aortic aneurysm, without rupture: Secondary | ICD-10-CM | POA: Diagnosis not present

## 2017-04-05 DIAGNOSIS — I2510 Atherosclerotic heart disease of native coronary artery without angina pectoris: Secondary | ICD-10-CM | POA: Diagnosis not present

## 2017-04-05 DIAGNOSIS — I10 Essential (primary) hypertension: Secondary | ICD-10-CM | POA: Diagnosis not present

## 2017-04-08 ENCOUNTER — Other Ambulatory Visit (INDEPENDENT_AMBULATORY_CARE_PROVIDER_SITE_OTHER): Payer: Medicare Other

## 2017-04-08 DIAGNOSIS — I10 Essential (primary) hypertension: Secondary | ICD-10-CM

## 2017-04-08 DIAGNOSIS — R739 Hyperglycemia, unspecified: Secondary | ICD-10-CM | POA: Diagnosis not present

## 2017-04-08 DIAGNOSIS — E7800 Pure hypercholesterolemia, unspecified: Secondary | ICD-10-CM

## 2017-04-08 LAB — CBC WITH DIFFERENTIAL/PLATELET
BASOS ABS: 0.1 10*3/uL (ref 0.0–0.1)
BASOS PCT: 1.6 % (ref 0.0–3.0)
Eosinophils Absolute: 0.3 10*3/uL (ref 0.0–0.7)
Eosinophils Relative: 4.1 % (ref 0.0–5.0)
HEMATOCRIT: 39.6 % (ref 36.0–46.0)
HEMOGLOBIN: 13 g/dL (ref 12.0–15.0)
LYMPHS PCT: 43.3 % (ref 12.0–46.0)
Lymphs Abs: 2.7 10*3/uL (ref 0.7–4.0)
MCHC: 32.9 g/dL (ref 30.0–36.0)
MCV: 99.5 fl (ref 78.0–100.0)
Monocytes Absolute: 0.5 10*3/uL (ref 0.1–1.0)
Monocytes Relative: 7.6 % (ref 3.0–12.0)
Neutro Abs: 2.7 10*3/uL (ref 1.4–7.7)
Neutrophils Relative %: 43.4 % (ref 43.0–77.0)
Platelets: 296 10*3/uL (ref 150.0–400.0)
RBC: 3.98 Mil/uL (ref 3.87–5.11)
RDW: 13.9 % (ref 11.5–15.5)
WBC: 6.3 10*3/uL (ref 4.0–10.5)

## 2017-04-08 LAB — BASIC METABOLIC PANEL
BUN: 18 mg/dL (ref 6–23)
CALCIUM: 9.6 mg/dL (ref 8.4–10.5)
CO2: 28 mEq/L (ref 19–32)
Chloride: 104 mEq/L (ref 96–112)
Creatinine, Ser: 0.86 mg/dL (ref 0.40–1.20)
GFR: 67.94 mL/min (ref >60.00)
Glucose, Bld: 91 mg/dL (ref 70–99)
POTASSIUM: 4.8 meq/L (ref 3.5–5.1)
SODIUM: 138 meq/L (ref 135–145)

## 2017-04-08 LAB — HEPATIC FUNCTION PANEL
ALT: 19 U/L (ref 0–35)
AST: 26 U/L (ref 0–37)
Albumin: 4.4 g/dL (ref 3.5–5.2)
Alkaline Phosphatase: 47 U/L (ref 39–117)
Bilirubin, Direct: 0.1 mg/dL (ref 0.0–0.3)
TOTAL PROTEIN: 6.9 g/dL (ref 6.0–8.3)
Total Bilirubin: 0.5 mg/dL (ref 0.2–1.2)

## 2017-04-08 LAB — LIPID PANEL
CHOLESTEROL: 168 mg/dL (ref 0–200)
HDL: 91 mg/dL (ref >39.00)
LDL CALC: 56 mg/dL (ref 0–99)
NonHDL: 76.81
TRIGLYCERIDES: 106 mg/dL (ref 0.0–149.0)
Total CHOL/HDL Ratio: 2
VLDL: 21.2 mg/dL (ref 0.0–40.0)

## 2017-04-08 LAB — TSH: TSH: 2.35 u[IU]/mL (ref 0.35–4.50)

## 2017-04-08 LAB — HEMOGLOBIN A1C: HEMOGLOBIN A1C: 5.5 % (ref 4.6–6.5)

## 2017-04-09 ENCOUNTER — Encounter: Payer: Self-pay | Admitting: Internal Medicine

## 2017-04-12 ENCOUNTER — Encounter: Payer: Self-pay | Admitting: Internal Medicine

## 2017-04-12 NOTE — Telephone Encounter (Signed)
Started Losartan  On 03/26/17 and broke out in hives so she saw her son-in-law who is a Paediatric nurse and he told her he thought was hives,  And called her in prednisone, and advised her to take benadryl 25 mg at night and Claritin 10 mg during the day, Patient held Losartan this morning because  She knew this was the only new medication she had started.

## 2017-04-12 NOTE — Telephone Encounter (Signed)
Please call pt and let her know that if she is on prednisone, benadryl and claritin - this would be the treatment that I would recommend.  This will help if allergic reaction.  Not sure if it was losartan (since she had been on diovan previously - same type of medication), but would remain off losartan.  Do not take anymore.  Will need to find another blood pressure medication for her to take, but would like to hold on starting today since having increased symptoms now.  Call with update tomorrow. Spot check blood pressure and let us know readings.  Will need to add a different medication.

## 2017-04-13 NOTE — Telephone Encounter (Signed)
Pt called back with some blood pressure readings.  Pt states that 10:50 it was 136/87 and at 2:00 it was 130/65. Please advise, thank you!  Call pt @ [phone]

## 2017-04-13 NOTE — Telephone Encounter (Signed)
Patient will contact us this afternoon with BP readings and patient stated her rash is much better today with medications prescribed and patient has not taken losartan for 24 hours.

## 2017-04-19 DIAGNOSIS — M7581 Other shoulder lesions, right shoulder: Secondary | ICD-10-CM | POA: Diagnosis not present

## 2017-04-19 DIAGNOSIS — S43014A Anterior dislocation of right humerus, initial encounter: Secondary | ICD-10-CM | POA: Diagnosis not present

## 2017-05-03 ENCOUNTER — Other Ambulatory Visit: Payer: Self-pay | Admitting: Internal Medicine

## 2017-06-24 ENCOUNTER — Encounter: Payer: Medicare Other | Admitting: Internal Medicine

## 2017-06-24 ENCOUNTER — Telehealth: Payer: Self-pay | Admitting: Internal Medicine

## 2017-06-24 DIAGNOSIS — Z0289 Encounter for other administrative examinations: Secondary | ICD-10-CM

## 2017-06-24 NOTE — Telephone Encounter (Signed)
FYI - Pt called and cancelled her appt this morning. Pt states that she is having bathroom issues.

## 2017-06-24 NOTE — Telephone Encounter (Signed)
FYI

## 2017-06-30 DIAGNOSIS — H04123 Dry eye syndrome of bilateral lacrimal glands: Secondary | ICD-10-CM | POA: Diagnosis not present

## 2017-07-15 DIAGNOSIS — H04123 Dry eye syndrome of bilateral lacrimal glands: Secondary | ICD-10-CM | POA: Diagnosis not present

## 2017-08-12 ENCOUNTER — Other Ambulatory Visit: Payer: Self-pay

## 2017-09-20 ENCOUNTER — Ambulatory Visit: Payer: Medicare Other

## 2017-09-20 ENCOUNTER — Encounter: Payer: Medicare Other | Admitting: Internal Medicine

## 2017-09-25 ENCOUNTER — Other Ambulatory Visit: Payer: Self-pay | Admitting: Internal Medicine

## 2017-09-25 ENCOUNTER — Other Ambulatory Visit: Payer: Self-pay | Admitting: Family Medicine

## 2017-09-29 DIAGNOSIS — H04123 Dry eye syndrome of bilateral lacrimal glands: Secondary | ICD-10-CM | POA: Diagnosis not present

## 2017-10-25 ENCOUNTER — Encounter: Payer: Self-pay | Admitting: Internal Medicine

## 2017-10-25 ENCOUNTER — Ambulatory Visit (INDEPENDENT_AMBULATORY_CARE_PROVIDER_SITE_OTHER): Payer: Medicare Other | Admitting: Internal Medicine

## 2017-10-25 VITALS — BP 148/84 | HR 81 | Temp 98.7°F | Resp 16 | Wt 157.6 lb

## 2017-10-25 DIAGNOSIS — K219 Gastro-esophageal reflux disease without esophagitis: Secondary | ICD-10-CM | POA: Diagnosis not present

## 2017-10-25 DIAGNOSIS — R05 Cough: Secondary | ICD-10-CM

## 2017-10-25 DIAGNOSIS — D649 Anemia, unspecified: Secondary | ICD-10-CM | POA: Diagnosis not present

## 2017-10-25 DIAGNOSIS — M4722 Other spondylosis with radiculopathy, cervical region: Secondary | ICD-10-CM

## 2017-10-25 DIAGNOSIS — Z0000 Encounter for general adult medical examination without abnormal findings: Secondary | ICD-10-CM | POA: Diagnosis not present

## 2017-10-25 DIAGNOSIS — I10 Essential (primary) hypertension: Secondary | ICD-10-CM

## 2017-10-25 DIAGNOSIS — E7800 Pure hypercholesterolemia, unspecified: Secondary | ICD-10-CM

## 2017-10-25 DIAGNOSIS — I712 Thoracic aortic aneurysm, without rupture, unspecified: Secondary | ICD-10-CM

## 2017-10-25 DIAGNOSIS — R059 Cough, unspecified: Secondary | ICD-10-CM

## 2017-10-25 DIAGNOSIS — R739 Hyperglycemia, unspecified: Secondary | ICD-10-CM | POA: Diagnosis not present

## 2017-10-25 DIAGNOSIS — R200 Anesthesia of skin: Secondary | ICD-10-CM

## 2017-10-25 MED ORDER — LOSARTAN POTASSIUM 50 MG PO TABS: 50.0000 mg | ORAL_TABLET | Freq: Every day | ORAL | 0 refills | Status: DC

## 2017-10-25 NOTE — Progress Notes (Signed)
Patient ID: Leah Huber, female   DOB: March 16, 1941, 77 y.o.   MRN: [id number]   Subjective:    Patient ID: Leah Huber, female    DOB: [birth year], 77 y.o.   MRN: [id number]  HPI  Patient with past history of CAD, GERD, hypertension and hypercholesterolemia.  She comes in today to follow up on these issues as well as for a complete physical exam.  She reports she is doing relatively well.  Tries to stay active.  Recent shoulder dislocation.  Increased rom now.  State she is having right hand numbness/tingling.  Describes burning fingers.  Occurs at night.  Also occurs if reading a book, holding the phone, etc.  Goes away if holds her hand down. Some arm pain as well.  No weakness.  She has noticed some drainage and some associated cough with this.  No significant cough.  No chest congestion.  No sob.  No chest pain.  Blood pressure elevated.  Stopped her losartan when heard warnings on TV.  Not on any blood pressure medication now.  No abdominal pain.  Bowels moving.     Past Medical History:  Diagnosis Date  . Anemia   . Aortic aneurysm (HCC)    "SMALL"  . Arthritis    neck to tailbone  . Bronchitis   . Chronic cough    @ NIGHT/ SEASONAL ALLERGIES  . Clostridium difficile infection 2013-2014  . Colon polyp   . Coronary artery disease    Dr Nehemiah Massed cardiologist  . Depression   . Diverticulosis   . GERD (gastroesophageal reflux disease)   . Glaucoma   . Hypercholesterolemia   . Hypertension    CONTROLLED ON MEDS  . Hypovolemia 12/10   acute renal failure  . IBS (irritable bowel syndrome)   . Neuromuscular disorder ([place])    occasionally tingling in hands/ NUMBNESS IN RIGHT FOOT s/p back surgery  . Osteoarthritis    bilateral knees  . Osteopenia   . Pneumonia    HX OF  . Sleep apnea    very mild   sleep study 8 yrs ago does not use CPAP  . Urinary incontinence    Past Surgical History:  Procedure Laterality Date  . ABDOMINAL HYSTERECTOMY  1979  . ANTERIOR CERVICAL  DECOMP/DISCECTOMY FUSION N/A 11/28/2014   Procedure: CERVICAL FIVE-SIX  ANTERIOR CERVICAL DECOMPRESSION/DISCECTOMY FUSION 1 LEVEL;  Surgeon: Newman Pies, MD;  Location: [facility];  Service: Neurosurgery;  Laterality: N/A;  C56 anterior cervical decompression with fusion interbody prosthesis plating and bonegraft  . BACK SURGERY     X2/ L4,5 & S1/ Cervical C7?  . BLADDER SURGERY  1981   SUSPENSION  . BREAST REDUCTION SURGERY    . CARPAL TUNNEL RELEASE  2007  . CATARACT EXTRACTION W/PHACO Right 12/30/2016   Procedure: CATARACT EXTRACTION PHACO AND INTRAOCULAR LENS PLACEMENT ([facility])  Right toric lens;  Surgeon: Leandrew Koyanagi, MD;  Location: [facility];  Service: Ophthalmology;  Laterality: Right;  sleep apnea, does not use CPAP  . CATARACT EXTRACTION W/PHACO Left 01/20/2017   Procedure: CATARACT EXTRACTION PHACO AND INTRAOCULAR LENS PLACEMENT ([place])  Left Toric;  Surgeon: Leandrew Koyanagi, MD;  Location: [facility];  Service: Ophthalmology;  Laterality: Left;  toric lens  . CHOLECYSTECTOMY  1996  . COLONOSCOPY    . FOOT SURGERY  2005  . [place]  . REDUCTION MAMMAPLASTY Bilateral 2001  . [place] & 2004   left and right  .  SPINAL CORD STIMULATOR IMPLANT  03/1997  . SPINAL CORD STIMULATOR REMOVAL  2007  . Tendon repair right shoulder    . TOTAL KNEE ARTHROPLASTY  10/2011 & 09/13   left and right   Family History  Problem Relation Age of Onset  . Heart disease Mother   . Stroke Mother   . Hypertension Mother   . Hyperlipidemia Father   . Heart disease Father   . Heart disease Brother        2 brothers  . Alzheimer's disease Brother   . Heart disease Sister        pacemaker  . Breast cancer Daughter 29   Social History   Socioeconomic History  . Marital status: Widowed    Spouse name: None  . Number of children: 2  . Years of education: None  . Highest education level: None  Social Needs  . Financial resource  strain: None  . Food insecurity - worry: None  . Food insecurity - inability: None  . Transportation needs - medical: None  . Transportation needs - non-medical: None  Occupational History  . None  Tobacco Use  . Smoking status: Former Smoker    Packs/day: 1.00    Years: 20.00    Pack years: 20.00    Types: Cigarettes    Last attempt to quit: 09/07/1988    Years since quitting: 29.1  . Smokeless tobacco: Never Used  Substance and Sexual Activity  . Alcohol use: Yes    Alcohol/week: 1.2 oz    Types: 2 Glasses of wine per week    Comment: occasional  . Drug use: No  . Sexual activity: No  Other Topics Concern  . None  Social History Narrative   She is widowed, has two daughters.    Outpatient Encounter Medications as of 10/25/2017  Medication Sig  . DETROL LA 4 MG 24 hr capsule TAKE 1 CAPSULE DAILY  . latanoprost (XALATAN) 0.005 % ophthalmic solution Place 1 drop into both eyes at bedtime.   . pantoprazole (PROTONIX) 40 MG tablet TAKE 1 TABLET TWICE A DAY  . rosuvastatin (CRESTOR) 20 MG tablet TAKE 1 TABLET DAILY  . losartan (COZAAR) 50 MG tablet Take 1 tablet (50 mg total) by mouth daily.  . [DISCONTINUED] losartan (COZAAR) 50 MG tablet Take 1 tablet (50 mg total) by mouth daily. (Patient not taking: Reported on 10/25/2017)  . [DISCONTINUED] losartan (COZAAR) 50 MG tablet Take 1 tablet (50 mg total) by mouth daily.   No facility-administered encounter medications on file as of 10/25/2017.     Review of Systems  Constitutional: Negative for appetite change and unexpected weight change.  HENT: Positive for congestion and postnasal drip. Negative for sinus pressure.   Eyes: Negative for pain and visual disturbance.  Respiratory: Negative for chest tightness and shortness of breath.        Minimal cough. No significant cough.   Cardiovascular: Negative for chest pain, palpitations and leg swelling.  Gastrointestinal: Negative for abdominal pain, diarrhea, nausea and vomiting.    Genitourinary: Negative for difficulty urinating and dysuria.  Musculoskeletal: Negative for joint swelling and myalgias.       Right arm pain and numbness as outlined.    Skin: Negative for color change and rash.  Neurological: Negative for dizziness and light-headedness.       No significant headache.    Hematological: Negative for adenopathy. Does not bruise/bleed easily.  Psychiatric/Behavioral: Negative for agitation and dysphoric mood.  Objective:    Physical Exam  Constitutional: She is oriented to person, place, and time. She appears well-developed and well-nourished. No distress.  HENT:  Nose: Nose normal.  Mouth/Throat: Oropharynx is clear and moist.  Eyes: Right eye exhibits no discharge. Left eye exhibits no discharge. No scleral icterus.  Neck: Neck supple. No thyromegaly present.  Cardiovascular: Normal rate and regular rhythm.  Pulmonary/Chest: Breath sounds normal. No accessory muscle usage. No tachypnea. No respiratory distress. She has no decreased breath sounds. She has no wheezes. She has no rhonchi. Right breast exhibits no inverted nipple, no mass, no nipple discharge and no tenderness (no axillary adenopathy). Left breast exhibits no inverted nipple, no mass, no nipple discharge and no tenderness (no axilarry adenopathy).  Abdominal: Soft. Bowel sounds are normal. There is no tenderness.  Musculoskeletal: She exhibits no edema or tenderness.  Lymphadenopathy:    She has no cervical adenopathy.  Neurological: She is alert and oriented to person, place, and time.  Skin: Skin is warm. No rash noted. No erythema.  Psychiatric: She has a normal mood and affect. Her behavior is normal.    BP (!) 148/84 (BP Location: Left Arm, Patient Position: Sitting, Cuff Size: Normal)   Pulse 81   Temp 98.7 F (37.1 C) (Oral)   Resp 16   Wt 157 lb 9.6 oz (71.5 kg)   LMP 08/15/1978   SpO2 98%   BMI 27.05 kg/m  Wt Readings from Last 3 Encounters:  10/25/17 157 lb 9.6  oz (71.5 kg)  03/18/17 160 lb (72.6 kg)  03/04/17 158 lb (71.7 kg)     Lab Results  Component Value Date   WBC 6.3 04/08/2017   HGB 13.0 04/08/2017   HCT 39.6 04/08/2017   PLT 296.0 04/08/2017   GLUCOSE 91 04/08/2017   CHOL 168 04/08/2017   TRIG 106.0 04/08/2017   HDL 91.00 04/08/2017   LDLDIRECT 147.1 05/30/2013   LDLCALC 56 04/08/2017   ALT 19 04/08/2017   AST 26 04/08/2017   NA 138 04/08/2017   K 4.8 04/08/2017   CL 104 04/08/2017   CREATININE 0.86 04/08/2017   BUN 18 04/08/2017   CO2 28 04/08/2017   TSH 2.35 04/08/2017   INR 1.0 10/16/2013   HGBA1C 5.5 04/08/2017    Dg Shoulder Right  Result Date: 03/04/2017 CLINICAL DATA:  [place] this morning.  Right shoulder pain. EXAM: RIGHT SHOULDER - 2+ VIEW COMPARISON:  None. 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 No definite fracture. Surgical changes involving the right humeral head. The [facility] joint is intact. Moderate degenerative changes. The visualized right lung is clear. IMPRESSION: Anterior subcoracoid dislocation of the humeral head. No definite fracture. Electronically Signed   By: Marijo Sanes M.D.   On: 03/04/2017 10:06   Ct Head Wo Contrast  Result Date: 03/04/2017 CLINICAL DATA:  77 year old female with history of trauma from a fall (patient reporting slipped while mopping the floor) with injury to the head. Head pain. EXAM: CT HEAD WITHOUT CONTRAST TECHNIQUE: Contiguous axial images were obtained from the base of the skull through the vertex without intravenous contrast. COMPARISON:  Head CT 10/16/2013. FINDINGS: Brain: Moderate cerebral and mild cerebellar atrophy. Patchy and confluent areas of decreased attenuation are noted throughout the deep and periventricular white matter of the cerebral hemispheres bilaterally, compatible with chronic microvascular ischemic disease. No evidence of acute infarction, hemorrhage, hydrocephalus, extra-axial collection or mass lesion/mass effect. Vascular: No  hyperdense vessel or unexpected calcification. Skull: Normal. Negative for fracture or  focal lesion. Sinuses/Orbits: No acute finding. Other: None. IMPRESSION: 1. No evidence of significant acute traumatic injury to the skull or brain. 2. Moderate cerebral and mild cerebellar atrophy with extensive chronic microvascular ischemic changes in the cerebral white matter, similar to the prior examination. Electronically Signed   By: Vinnie Langton M.D.   On: 03/04/2017 09:41   Dg Shoulder Right Portable  Result Date: 03/04/2017 CLINICAL DATA:  RIGHT shoulder dislocation post reduction EXAM: PORTABLE RIGHT SHOULDER COMPARISON:  03/04/2017 FINDINGS: Previously identified RIGHT glenohumeral dislocation appears reduced. Bones demineralized. AC joint alignment normal. Suspect prior rotator cuff repair. No definite fractures identified. Visualized RIGHT ribs intact. IMPRESSION: Reduction of previously identified RIGHT glenohumeral dislocation. Electronically Signed   By: Lavonia Dana M.D.   On: 03/04/2017 12:02       Assessment & Plan:   Problem List Items Addressed This Visit    Anemia    Follow cbc.       Aortic aneurysm [facility])    Has been followed by cardiology.       Relevant Medications   losartan (COZAAR) 50 MG tablet   Cervical spondylosis with radiculopathy    Has had neck surgery.  Has previously been followed by Dr Arnoldo Morale.  With right hand numbness and arm pain as outlined.  Unclear if neck etiology.  Discussed a trial with wrist splint.  Schedule for NCS.        Cough    Minimal cough.  With drainage.  nasacort nasal spray as directed.        GERD (gastroesophageal reflux disease)    Controlled on current regimen.  Follow.        Hand numbness    Hand numbness and arm pain as outlined.  Has underlying neck issues.  Has has surgery.  Unclear etiology.  Question of worsening CTS or neck etiology, etc.  Can try wrist splint.  Schedule for NCS.        Relevant Orders   Ambulatory  referral to Neurology   Health care maintenance    Physical today 10/25/17.  Mammogram 12/01/16 - birads I.  Colonoscopy 01/2014.  Recommended f/u colonoscopy in 01/2019.  Schedule f/u mammogram.        Hypercholesterolemia    On crestor.  Low cholesterol diet and exercise.  Follow lipid panel and liver function tests.        Relevant Medications   losartan (COZAAR) 50 MG tablet   Other Relevant Orders   Hepatic function panel   Lipid panel   Hypertension    Blood pressure elevated.  Off medication.  Restart losartan.  Follow.  Get her back in soon to reassess.        Relevant Medications   losartan (COZAAR) 50 MG tablet   Other Relevant Orders   Basic metabolic panel    Other Visit Diagnoses    Hyperglycemia    -  Primary   Relevant Orders   Hemoglobin A1c       Einar Pheasant, MD

## 2017-10-25 NOTE — Assessment & Plan Note (Addendum)
Physical today 10/25/17.  Mammogram 12/01/16 - birads I.  Colonoscopy 01/2014.  Recommended f/u colonoscopy in 01/2019.  Schedule f/u mammogram.

## 2017-10-25 NOTE — Patient Instructions (Signed)
nasacort nasal spray - 2 sprays each nostril one time per day.  Do this in the evening.    Saline nasal spray - flush nose at least 2-3x/day  Let me know if any worsening symptms.    Can try - cock up wrist splint.    Lotrimin cream to toe twice a day

## 2017-10-26 ENCOUNTER — Encounter: Payer: Self-pay | Admitting: Internal Medicine

## 2017-10-26 ENCOUNTER — Ambulatory Visit (INDEPENDENT_AMBULATORY_CARE_PROVIDER_SITE_OTHER): Payer: Medicare Other

## 2017-10-26 VITALS — BP 130/78 | HR 83 | Temp 98.6°F | Resp 14 | Ht 64.0 in | Wt 157.0 lb

## 2017-10-26 DIAGNOSIS — Z0000 Encounter for general adult medical examination without abnormal findings: Secondary | ICD-10-CM

## 2017-10-26 DIAGNOSIS — R200 Anesthesia of skin: Secondary | ICD-10-CM | POA: Insufficient documentation

## 2017-10-26 NOTE — Assessment & Plan Note (Signed)
Has had neck surgery.  Has previously been followed by Dr Arnoldo Morale.  With right hand numbness and arm pain as outlined.  Unclear if neck etiology.  Discussed a trial with wrist splint.  Schedule for NCS.

## 2017-10-26 NOTE — Assessment & Plan Note (Signed)
Follow cbc.  

## 2017-10-26 NOTE — Assessment & Plan Note (Signed)
Has been followed by cardiology.  

## 2017-10-26 NOTE — Assessment & Plan Note (Signed)
Controlled on current regimen.  Follow.  

## 2017-10-26 NOTE — Assessment & Plan Note (Signed)
Minimal cough.  With drainage.  nasacort nasal spray as directed.

## 2017-10-26 NOTE — Patient Instructions (Addendum)
  Leah Huber , Thank you for taking time to come for your Medicare Wellness Visit. I appreciate your ongoing commitment to your health goals. Please review the following plan we discussed and let me know if I can assist you in the future.   Follow up with Dr. Nicki Reaper as needed.    Have a great day!  These are the goals we discussed: Goals    . Maintain Healthy Lifestyle     Walk for exercise Stay hydrated Healthy diet       This is a list of the screening recommended for you and due dates:  Health Maintenance  Topic Date Due  . Tetanus Vaccine  12/03/1958  . Mammogram  11/30/2017  . Colon Cancer Screening  01/20/2019  . Flu Shot  Completed  . DEXA scan (bone density measurement)  Completed  . Pneumonia vaccines  Completed

## 2017-10-26 NOTE — Assessment & Plan Note (Signed)
Hand numbness and arm pain as outlined.  Has underlying neck issues.  Has has surgery.  Unclear etiology.  Question of worsening CTS or neck etiology, etc.  Can try wrist splint.  Schedule for NCS.

## 2017-10-26 NOTE — Assessment & Plan Note (Signed)
On crestor.  Low cholesterol diet and exercise.  Follow lipid panel and liver function tests.   

## 2017-10-26 NOTE — Assessment & Plan Note (Addendum)
Blood pressure elevated.  Off medication.  Restart losartan.  Follow.  Get her back in soon to reassess.

## 2017-10-26 NOTE — Progress Notes (Addendum)
Subjective:   Leah Huber is a 77 y.o. female who presents for Medicare Annual (Subsequent) preventive examination.  Review of Systems:  No ROS.  Medicare Wellness Visit. Additional risk factors are reflected in the social history.  Cardiac Risk Factors include: advanced age (>97men, >3 women);hypertension     Objective:     Vitals: BP 130/78 (BP Location: Left Arm, Patient Position: Sitting, Cuff Size: Normal)   Pulse 83   Temp 98.6 F (37 C) (Oral)   Resp 14   Ht 5\' 4"  (1.626 m)   Wt 157 lb (71.2 kg)   LMP 08/15/1978   SpO2 97%   BMI 26.95 kg/m   Body mass index is 26.95 kg/m.  Advanced Directives 10/26/2017 03/04/2017 01/20/2017 12/30/2016 09/18/2016 11/19/2014  Does Patient Have a Medical Advance Directive? Yes No Yes Yes Yes Yes  Type of Paramedic of [place];Living will - [place];Living will [place];Living will [place];Living will -  Does patient want to make changes to medical advance directive? No - Patient declined - No - Patient declined No - Patient declined No - Patient declined -  Copy of [place] in Chart? Yes - Yes No - copy requested Yes Yes  Would patient like information on creating a medical advance directive? - No - Patient declined - - - -    Tobacco Social History   Tobacco Use  Smoking Status Former Smoker  . Packs/day: 1.00  . Years: 20.00  . Pack years: 20.00  . Types: Cigarettes  . Last attempt to quit: 09/07/1988  . Years since quitting: 29.1  Smokeless Tobacco Never Used     Counseling given: Not Answered   Clinical Intake:  Pre-visit preparation completed: Yes  Pain : No/denies pain     Nutritional Status: BMI 25 -29 Overweight Diabetes: No  How often do you need to have someone help you when you read instructions, pamphlets, or other written materials from your doctor or pharmacy?: 1 - Never  Interpreter Needed?:  No     Past Medical History:  Diagnosis Date  . Anemia   . Aortic aneurysm (HCC)    "SMALL"  . Arthritis    neck to tailbone  . Bronchitis   . Chronic cough    @ NIGHT/ SEASONAL ALLERGIES  . Clostridium difficile infection 2013-2014  . Colon polyp   . Coronary artery disease    Dr Nehemiah Massed cardiologist  . Depression   . Diverticulosis   . GERD (gastroesophageal reflux disease)   . Glaucoma   . Hypercholesterolemia   . Hypertension    CONTROLLED ON MEDS  . Hypovolemia 12/10   acute renal failure  . IBS (irritable bowel syndrome)   . Neuromuscular disorder ([place])    occasionally tingling in hands/ NUMBNESS IN RIGHT FOOT s/p back surgery  . Osteoarthritis    bilateral knees  . Osteopenia   . Pneumonia    HX OF  . Sleep apnea    very mild   sleep study 8 yrs ago does not use CPAP  . Urinary incontinence    Past Surgical History:  Procedure Laterality Date  . ABDOMINAL HYSTERECTOMY  1979  . ANTERIOR CERVICAL DECOMP/DISCECTOMY FUSION N/A 11/28/2014   Procedure: CERVICAL FIVE-SIX  ANTERIOR CERVICAL DECOMPRESSION/DISCECTOMY FUSION 1 LEVEL;  Surgeon: Newman Pies, MD;  Location: [facility];  Service: Neurosurgery;  Laterality: N/A;  C56 anterior cervical decompression with fusion interbody prosthesis plating and  bonegraft  . BACK SURGERY     X2/ L4,5 & S1/ Cervical C7?  . BLADDER SURGERY  1981   SUSPENSION  . BREAST REDUCTION SURGERY    . CARPAL TUNNEL RELEASE  2007  . CATARACT EXTRACTION W/PHACO Right 12/30/2016   Procedure: CATARACT EXTRACTION PHACO AND INTRAOCULAR LENS PLACEMENT (IOC)  Right toric lens;  Surgeon: Leandrew Koyanagi, MD;  Location: [place] [facility];  Service: Ophthalmology;  Laterality: Right;  sleep apnea, does not use CPAP  . CATARACT EXTRACTION W/PHACO Left 01/20/2017   Procedure: CATARACT EXTRACTION PHACO AND INTRAOCULAR LENS PLACEMENT ([place])  Left Toric;  Surgeon: Leandrew Koyanagi, MD;  Location: [facility];  Service: Ophthalmology;   Laterality: Left;  toric lens  . CHOLECYSTECTOMY  1996  . COLONOSCOPY    . FOOT SURGERY  2005  . [place]  . REDUCTION MAMMAPLASTY Bilateral 2001  . [place] & 2004   left and right  . SPINAL CORD STIMULATOR IMPLANT  03/1997  . SPINAL CORD STIMULATOR REMOVAL  2007  . Tendon repair right shoulder    . TOTAL KNEE ARTHROPLASTY  10/2011 & 09/13   left and right   Family History  Problem Relation Age of Onset  . Heart disease Mother   . Stroke Mother   . Hypertension Mother   . Hyperlipidemia Father   . Heart disease Father   . Heart disease Brother        2 brothers  . Alzheimer's disease Brother   . Heart disease Sister        pacemaker  . Breast cancer Daughter 79   Social History   Socioeconomic History  . Marital status: Widowed    Spouse name: None  . Number of children: 2  . Years of education: None  . Highest education level: None  Social Needs  . Financial resource strain: None  . Food insecurity - worry: None  . Food insecurity - inability: None  . Transportation needs - medical: None  . Transportation needs - non-medical: None  Occupational History  . None  Tobacco Use  . Smoking status: Former Smoker    Packs/day: 1.00    Years: 20.00    Pack years: 20.00    Types: Cigarettes    Last attempt to quit: 09/07/1988    Years since quitting: 29.1  . Smokeless tobacco: Never Used  Substance and Sexual Activity  . Alcohol use: Yes    Alcohol/week: 1.2 oz    Types: 2 Glasses of wine per week    Comment: occasional  . Drug use: No  . Sexual activity: No  Other Topics Concern  . None  Social History Narrative   She is widowed, has two daughters.    Outpatient Encounter Medications as of 10/26/2017  Medication Sig  . DETROL LA 4 MG 24 hr capsule TAKE 1 CAPSULE DAILY  . latanoprost (XALATAN) 0.005 % ophthalmic solution Place 1 drop into both eyes at bedtime.   Marland Kitchen losartan (COZAAR) 50 MG tablet Take 1 tablet (50 mg total) by  mouth daily.  . pantoprazole (PROTONIX) 40 MG tablet TAKE 1 TABLET TWICE A DAY  . rosuvastatin (CRESTOR) 20 MG tablet TAKE 1 TABLET DAILY   No facility-administered encounter medications on file as of 10/26/2017.     Activities of Daily Living In your present state of health, do you have any difficulty performing the following activities: 10/26/2017 01/20/2017  Hearing? N N  Vision? N N  Difficulty concentrating or making decisions? N N  Walking or climbing stairs? Y N  Comment Hx of knee pain, bilateral -  Dressing or bathing? N N  Doing errands, shopping? N -  Preparing Food and eating ? N -  Using the Toilet? N -  In the past six months, have you accidently leaked urine? N -  Do you have problems with loss of bowel control? N -  Managing your Medications? N -  Managing your Finances? N -  Housekeeping or managing your Housekeeping? Y -  Comment housekeeper assists -  Some recent data might be hidden    Patient Care Team: Einar Pheasant, MD as PCP - General (Internal Medicine)    Assessment:   This is a routine wellness examination for [facility].  The goal of the wellness visit is to assist the patient how to close the gaps in care and create a preventative care plan for the patient.   The roster of all physicians providing medical care to patient is listed in the Snapshot section of the chart.  Osteoporosis risk reviewed.    Safety issues reviewed; lives alone. Alarm, smoke and carbon monoxide detectors in the home. No firearms or firearms locked in a safe within the home. Wears seatbelts when driving or riding with others. No violence in the home.  They do not have excessive sun exposure.  Discussed the need for sun protection: hats, long sleeves and the use of sunscreen if there is significant sun exposure.  Patient is alert, normal appearance, oriented to person/place/and time.  Correctly identified the president of the [place] and recalls of 3/3 words. Performs simple  calculations and can read correct time from watch face.  Displays appropriate judgement.  No new identified risk were noted.  No failures at ADL's or IADL's.    BMI- discussed the importance of a healthy diet, water intake and the benefits of aerobic exercise. Educational material provided.   24 hour diet recall: Breakfast: greek yogurt, half of bagel Lunch: deli sandwich  Dinner: lean meat, vegetables  Daily fluid intake: 1 cups of caffeine, 6-8 cups of water See history for alcohol intake.  Dental- every 6 months.  Eye- Visual acuity not assessed per patient preference since they have regular follow up with the ophthalmologist.  Wears corrective lenses.  Sleep patterns- Sleeps 6-8 hours at night.  Wakes feeling rested. Naps during the day as needed.  TDAP vaccine deferred per patient preference.  Follow up with insurance.  Educational material provided.  Patient Concerns: None at this time. Follow up with PCP as needed.  Exercise Activities and Dietary recommendations Current Exercise Habits: Home exercise routine, Type of exercise: walking, Time (Minutes): 20, Frequency (Times/Week): 7, Weekly Exercise (Minutes/Week): 140, Intensity: Mild  Goals    . Maintain Healthy Lifestyle     Walk for exercise Stay hydrated Healthy diet       Fall Risk Fall Risk  10/26/2017 08/12/2017 07/27/2016 11/08/2015 11/02/2014  Falls in the past year? Yes Yes No No No  Comment - Emmi Telephone Survey: data to providers prior to load - - -  Number falls in past yr: 1 1 - - -  Comment - Emmi Telephone Survey Actual Response = 1 - - -  Injury with Fall? Yes Yes - - -  Follow up Education provided - - - -  Comment Followed by PCP. Physical therapy completed on R shoulder.   - - - -   Depression Screen PHQ 2/9 Scores 10/26/2017  07/27/2016 11/08/2015 11/02/2014  PHQ - 2 Score 0 0 0 0     Cognitive Function MMSE - Mini Mental State Exam 09/18/2016  Orientation to time 5  Orientation to Place 5    Registration 3  Attention/ Calculation 5  Recall 3  Language- name 2 objects 2  Language- repeat 1  Language- follow 3 step command 3  Language- read & follow direction 1  Write a sentence 1  Copy design 1  Total score 30     6CIT Screen 10/26/2017  What Year? 0 points  What month? 0 points  What time? 0 points  Count back from 20 0 points  Months in reverse 0 points  Repeat phrase 0 points  Total Score 0    Immunization History  Administered Date(s) Administered  . Influenza Split 06/29/2014  . Influenza, High Dose Seasonal PF 07/27/2016  . Influenza,inj,Quad PF,6+ Mos 06/18/2015  . Influenza-Unspecified 06/18/2015, 07/27/2016, 05/22/2017  . Pneumococcal Conjugate-13 07/30/2014  . Pneumococcal Polysaccharide-23 03/18/2017   Screening Tests Health Maintenance  Topic Date Due  . TETANUS/TDAP  12/03/1958  . MAMMOGRAM  11/30/2017  . COLONOSCOPY  01/20/2019  . INFLUENZA VACCINE  Completed  . DEXA SCAN  Completed  . PNA vac Low Risk Adult  Completed      Plan:    End of life planning; Advance aging; Advanced directives discussed. Copy of current HCPOA/Living Will on file.    I have personally reviewed and noted the following in the patient's chart:   . Medical and social history . Use of alcohol, tobacco or illicit drugs  . Current medications and supplements . Functional ability and status . Nutritional status . Physical activity . Advanced directives . List of other physicians . Hospitalizations, surgeries, and ER visits in previous 12 months . Vitals . Screenings to include cognitive, depression, and falls . Referrals and appointments  In addition, I have reviewed and discussed with patient certain preventive protocols, quality metrics, and best practice recommendations. A written personalized care plan for preventive services as well as general preventive health recommendations were provided to patient.     Varney Biles,  LPN  [id number]   Reviewed above information.  Agree with assessment and plan.    Dr Nicki Reaper

## 2017-10-27 ENCOUNTER — Encounter: Payer: Self-pay | Admitting: Internal Medicine

## 2017-10-27 ENCOUNTER — Other Ambulatory Visit (INDEPENDENT_AMBULATORY_CARE_PROVIDER_SITE_OTHER): Payer: Medicare Other

## 2017-10-27 DIAGNOSIS — R739 Hyperglycemia, unspecified: Secondary | ICD-10-CM | POA: Diagnosis not present

## 2017-10-27 DIAGNOSIS — I10 Essential (primary) hypertension: Secondary | ICD-10-CM

## 2017-10-27 DIAGNOSIS — E7800 Pure hypercholesterolemia, unspecified: Secondary | ICD-10-CM | POA: Diagnosis not present

## 2017-10-27 LAB — BASIC METABOLIC PANEL
BUN: 23 mg/dL (ref 6–23)
CALCIUM: 9.5 mg/dL (ref 8.4–10.5)
CO2: 29 mEq/L (ref 19–32)
CREATININE: 0.89 mg/dL (ref 0.40–1.20)
Chloride: 102 mEq/L (ref 96–112)
GFR: 65.21 mL/min (ref >60.00)
Glucose, Bld: 91 mg/dL (ref 70–99)
Potassium: 4.5 mEq/L (ref 3.5–5.1)
Sodium: 139 mEq/L (ref 135–145)

## 2017-10-27 LAB — LIPID PANEL
CHOL/HDL RATIO: 2
CHOLESTEROL: 179 mg/dL (ref 0–200)
HDL: 86.6 mg/dL (ref >39.00)
LDL Cholesterol: 76 mg/dL (ref 0–99)
NonHDL: 92.79
Triglycerides: 83 mg/dL (ref 0.0–149.0)
VLDL: 16.6 mg/dL (ref 0.0–40.0)

## 2017-10-27 LAB — HEMOGLOBIN A1C: Hgb A1c MFr Bld: 5.5 % (ref 4.6–6.5)

## 2017-10-27 LAB — HEPATIC FUNCTION PANEL
ALT: 14 U/L (ref 0–35)
AST: 22 U/L (ref 0–37)
Albumin: 4.4 g/dL (ref 3.5–5.2)
Alkaline Phosphatase: 49 U/L (ref 39–117)
BILIRUBIN DIRECT: 0.1 mg/dL (ref 0.0–0.3)
BILIRUBIN TOTAL: 0.5 mg/dL (ref 0.2–1.2)
Total Protein: 6.8 g/dL (ref 6.0–8.3)

## 2017-10-30 ENCOUNTER — Other Ambulatory Visit: Payer: Self-pay | Admitting: Internal Medicine

## 2017-11-07 DIAGNOSIS — B9689 Other specified bacterial agents as the cause of diseases classified elsewhere: Secondary | ICD-10-CM | POA: Diagnosis not present

## 2017-11-07 DIAGNOSIS — J019 Acute sinusitis, unspecified: Secondary | ICD-10-CM | POA: Diagnosis not present

## 2017-11-07 DIAGNOSIS — R6889 Other general symptoms and signs: Secondary | ICD-10-CM | POA: Diagnosis not present

## 2017-11-07 DIAGNOSIS — J209 Acute bronchitis, unspecified: Secondary | ICD-10-CM | POA: Diagnosis not present

## 2017-11-17 DIAGNOSIS — H401131 Primary open-angle glaucoma, bilateral, mild stage: Secondary | ICD-10-CM | POA: Diagnosis not present

## 2017-11-19 DIAGNOSIS — H401131 Primary open-angle glaucoma, bilateral, mild stage: Secondary | ICD-10-CM | POA: Diagnosis not present

## 2017-11-24 ENCOUNTER — Ambulatory Visit (INDEPENDENT_AMBULATORY_CARE_PROVIDER_SITE_OTHER): Payer: Medicare Other | Admitting: Internal Medicine

## 2017-11-24 ENCOUNTER — Encounter: Payer: Self-pay | Admitting: Internal Medicine

## 2017-11-24 VITALS — BP 138/80 | HR 89 | Temp 97.6°F | Resp 18 | Wt 157.4 lb

## 2017-11-24 DIAGNOSIS — R739 Hyperglycemia, unspecified: Secondary | ICD-10-CM

## 2017-11-24 DIAGNOSIS — I10 Essential (primary) hypertension: Secondary | ICD-10-CM | POA: Diagnosis not present

## 2017-11-24 DIAGNOSIS — R0981 Nasal congestion: Secondary | ICD-10-CM | POA: Diagnosis not present

## 2017-11-24 DIAGNOSIS — I712 Thoracic aortic aneurysm, without rupture, unspecified: Secondary | ICD-10-CM

## 2017-11-24 DIAGNOSIS — Z1231 Encounter for screening mammogram for malignant neoplasm of breast: Secondary | ICD-10-CM | POA: Diagnosis not present

## 2017-11-24 DIAGNOSIS — K219 Gastro-esophageal reflux disease without esophagitis: Secondary | ICD-10-CM

## 2017-11-24 DIAGNOSIS — D649 Anemia, unspecified: Secondary | ICD-10-CM | POA: Diagnosis not present

## 2017-11-24 DIAGNOSIS — R200 Anesthesia of skin: Secondary | ICD-10-CM | POA: Diagnosis not present

## 2017-11-24 DIAGNOSIS — E7800 Pure hypercholesterolemia, unspecified: Secondary | ICD-10-CM | POA: Diagnosis not present

## 2017-11-24 DIAGNOSIS — Z1239 Encounter for other screening for malignant neoplasm of breast: Secondary | ICD-10-CM

## 2017-11-24 MED ORDER — AZELASTINE HCL 0.1 % NA SOLN: 1.0000 | Freq: Two times a day (BID) | NASAL | 0 refills | Status: DC

## 2017-11-24 NOTE — Progress Notes (Signed)
screenin

## 2017-11-24 NOTE — Progress Notes (Signed)
Patient ID: Leah Huber, female   DOB: August 17, 1941, 77 y.o.   MRN: [id number]   Subjective:    Patient ID: Leah Huber, female    DOB: June 09, 1941, 77 y.o.   MRN: [id number]  HPI  Patient here for a scheduled follow up.  She reports she is doing relatively well.  Was seen 11/07/17 and diagnosed with sinusitis.  Was placed on zpak, albuterol inhaler, prednisone and tessalon perles.  Better.  Still with some nasal drainage and dripping.  Using steroid nasal spray.  No chest pain.  No sob.  No cough or chest congestion.  No acid reflux.  No abdominal pain.  Bowels moving.  Using a splint for hand numbness.  Is helping.  Scheduled for NCS 12/2017.  Back on losartan.  States blood pressures averaging 113-118/70.     Past Medical History:  Diagnosis Date  . Anemia   . Aortic aneurysm (HCC)    "SMALL"  . Arthritis    neck to tailbone  . Bronchitis   . Chronic cough    @ NIGHT/ SEASONAL ALLERGIES  . Clostridium difficile infection 2013-2014  . Colon polyp   . Coronary artery disease    Dr Nehemiah Massed cardiologist  . Depression   . Diverticulosis   . GERD (gastroesophageal reflux disease)   . Glaucoma   . Hypercholesterolemia   . Hypertension    CONTROLLED ON MEDS  . Hypovolemia 12/10   acute renal failure  . IBS (irritable bowel syndrome)   . Neuromuscular disorder ([place])    occasionally tingling in hands/ NUMBNESS IN RIGHT FOOT s/p back surgery  . Osteoarthritis    bilateral knees  . Osteopenia   . Pneumonia    HX OF  . Sleep apnea    very mild   sleep study 8 yrs ago does not use CPAP  . Urinary incontinence    Past Surgical History:  Procedure Laterality Date  . ABDOMINAL HYSTERECTOMY  1979  . ANTERIOR CERVICAL DECOMP/DISCECTOMY FUSION N/A 11/28/2014   Procedure: CERVICAL FIVE-SIX  ANTERIOR CERVICAL DECOMPRESSION/DISCECTOMY FUSION 1 LEVEL;  Surgeon: Newman Pies, MD;  Location: [place];  Service: Neurosurgery;  Laterality: N/A;  C56 anterior cervical decompression with  fusion interbody prosthesis plating and bonegraft  . BACK SURGERY     X2/ L4,5 & S1/ Cervical C7?  . BLADDER SURGERY  1981   SUSPENSION  . BREAST REDUCTION SURGERY    . CARPAL TUNNEL RELEASE  2007  . CATARACT EXTRACTION W/PHACO Right 12/30/2016   Procedure: CATARACT EXTRACTION PHACO AND INTRAOCULAR LENS PLACEMENT (IOC)  Right toric lens;  Surgeon: Leandrew Koyanagi, MD;  Location: [place];  Service: Ophthalmology;  Laterality: Right;  sleep apnea, does not use CPAP  . CATARACT EXTRACTION W/PHACO Left 01/20/2017   Procedure: CATARACT EXTRACTION PHACO AND INTRAOCULAR LENS PLACEMENT ([place])  Left Toric;  Surgeon: Leandrew Koyanagi, MD;  Location: [place];  Service: Ophthalmology;  Laterality: Left;  toric lens  . CHOLECYSTECTOMY  1996  . COLONOSCOPY    . FOOT SURGERY  2005  . [place]  . REDUCTION MAMMAPLASTY Bilateral 2001  . [place] & 2004   left and right  . SPINAL CORD STIMULATOR IMPLANT  03/1997  . SPINAL CORD STIMULATOR REMOVAL  2007  . Tendon repair right shoulder    . TOTAL KNEE ARTHROPLASTY  10/2011 & 09/13   left and right   Family History  Problem Relation Age of Onset  .  Heart disease Mother   . Stroke Mother   . Hypertension Mother   . Hyperlipidemia Father   . Heart disease Father   . Heart disease Brother        2 brothers  . Alzheimer's disease Brother   . Heart disease Sister        pacemaker  . Breast cancer Daughter 33   Social History   Socioeconomic History  . Marital status: Widowed    Spouse name: Not on file  . Number of children: 2  . Years of education: Not on file  . Highest education level: Not on file  Occupational History  . Not on file  Social Needs  . Financial resource strain: Not on file  . Food insecurity:    Worry: Not on file    Inability: Not on file  . Transportation needs:    Medical: Not on file    Non-medical: Not on file  Tobacco Use  . Smoking status: Former  Smoker    Packs/day: 1.00    Years: 20.00    Pack years: 20.00    Types: Cigarettes    Last attempt to quit: 09/07/1988    Years since quitting: 29.2  . Smokeless tobacco: Never Used  Substance and Sexual Activity  . Alcohol use: Yes    Alcohol/week: 1.2 oz    Types: 2 Glasses of wine per week    Comment: occasional  . Drug use: No  . Sexual activity: Never  Lifestyle  . Physical activity:    Days per week: Not on file    Minutes per session: Not on file  . Stress: Not on file  Relationships  . Social connections:    Talks on phone: Not on file    Gets together: Not on file    Attends religious service: Not on file    Active member of club or organization: Not on file    Attends meetings of clubs or organizations: Not on file    Relationship status: Not on file  Other Topics Concern  . Not on file  Social History Narrative   She is widowed, has two daughters.    Outpatient Encounter Medications as of 11/24/2017  Medication Sig  . albuterol (PROVENTIL HFA;VENTOLIN HFA) 108 (90 Base) MCG/ACT inhaler INL 2 PFS PO QID PRF SOB OR WHZ OR COUGH  . azelastine (ASTELIN) 0.1 % nasal spray Place 1 spray into both nostrils 2 (two) times daily. Use in each nostril as directed  . DETROL LA 4 MG 24 hr capsule TAKE 1 CAPSULE DAILY  . latanoprost (XALATAN) 0.005 % ophthalmic solution Place 1 drop into both eyes at bedtime.   Marland Kitchen losartan (COZAAR) 50 MG tablet Take 1 tablet (50 mg total) by mouth daily.  . pantoprazole (PROTONIX) 40 MG tablet TAKE 1 TABLET TWICE A DAY  . rosuvastatin (CRESTOR) 20 MG tablet TAKE 1 TABLET DAILY   No facility-administered encounter medications on file as of 11/24/2017.     Review of Systems  Constitutional: Negative for appetite change and unexpected weight change.  HENT: Positive for congestion and postnasal drip. Negative for sinus pressure.   Respiratory: Negative for cough, chest tightness and shortness of breath.   Cardiovascular: Negative for chest  pain, palpitations and leg swelling.  Gastrointestinal: Negative for abdominal pain, diarrhea, nausea and vomiting.  Genitourinary: Negative for difficulty urinating and dysuria.  Musculoskeletal: Negative for gait problem and joint swelling.  Skin: Negative for color change and rash.  Neurological: Negative  for dizziness, light-headedness and headaches.  Psychiatric/Behavioral: Negative for agitation and dysphoric mood.       Objective:     Blood pressure rechecked by me:  136-138/80  Physical Exam  Constitutional: She appears well-developed and well-nourished. No distress.  HENT:  Nose: Nose normal.  Mouth/Throat: Oropharynx is clear and moist.  Neck: Neck supple. No thyromegaly present.  Cardiovascular: Normal rate and regular rhythm.  Pulmonary/Chest: Breath sounds normal. No respiratory distress. She has no wheezes.  Abdominal: Soft. Bowel sounds are normal. There is no tenderness.  Musculoskeletal: She exhibits no edema or tenderness.  Lymphadenopathy:    She has no cervical adenopathy.  Skin: No rash noted. No erythema.  Psychiatric: She has a normal mood and affect. Her behavior is normal.    BP 138/80   Pulse 89   Temp 97.6 F (36.4 C) (Oral)   Resp 18   Wt 157 lb 6.4 oz (71.4 kg)   LMP 08/15/1978   SpO2 99%   BMI 27.02 kg/m  Wt Readings from Last 3 Encounters:  11/24/17 157 lb 6.4 oz (71.4 kg)  10/26/17 157 lb (71.2 kg)  10/25/17 157 lb 9.6 oz (71.5 kg)     Lab Results  Component Value Date   WBC 6.3 04/08/2017   HGB 13.0 04/08/2017   HCT 39.6 04/08/2017   PLT 296.0 04/08/2017   GLUCOSE 91 10/27/2017   CHOL 179 10/27/2017   TRIG 83.0 10/27/2017   HDL 86.60 10/27/2017   LDLDIRECT 147.1 05/30/2013   LDLCALC 76 10/27/2017   ALT 14 10/27/2017   AST 22 10/27/2017   NA 139 10/27/2017   K 4.5 10/27/2017   CL 102 10/27/2017   CREATININE 0.89 10/27/2017   BUN 23 10/27/2017   CO2 29 10/27/2017   TSH 2.35 04/08/2017   INR 1.0 10/16/2013   HGBA1C 5.5  10/27/2017    Dg Shoulder Right  Result Date: 03/04/2017 CLINICAL DATA:  [place] this morning.  Right shoulder pain. EXAM: RIGHT SHOULDER - 2+ VIEW COMPARISON:  None. 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 No definite fracture. Surgical changes involving the right humeral head. The [facility] joint is intact. Moderate degenerative changes. The visualized right lung is clear. IMPRESSION: Anterior subcoracoid dislocation of the humeral head. No definite fracture. Electronically Signed   By: Marijo Sanes M.D.   On: 03/04/2017 10:06   Ct Head Wo Contrast  Result Date: 03/04/2017 CLINICAL DATA:  77 year old female with history of trauma from a fall (patient reporting slipped while mopping the floor) with injury to the head. Head pain. EXAM: CT HEAD WITHOUT CONTRAST TECHNIQUE: Contiguous axial images were obtained from the base of the skull through the vertex without intravenous contrast. COMPARISON:  Head CT 10/16/2013. FINDINGS: Brain: Moderate cerebral and mild cerebellar atrophy. Patchy and confluent areas of decreased attenuation are noted throughout the deep and periventricular white matter of the cerebral hemispheres bilaterally, compatible with chronic microvascular ischemic disease. No evidence of acute infarction, hemorrhage, hydrocephalus, extra-axial collection or mass lesion/mass effect. Vascular: No hyperdense vessel or unexpected calcification. Skull: Normal. Negative for fracture or focal lesion. Sinuses/Orbits: No acute finding. Other: None. IMPRESSION: 1. No evidence of significant acute traumatic injury to the skull or brain. 2. Moderate cerebral and mild cerebellar atrophy with extensive chronic microvascular ischemic changes in the cerebral white matter, similar to the prior examination. Electronically Signed   By: Vinnie Langton M.D.   On: 03/04/2017 09:41   Dg Shoulder Right Portable  Result Date: 03/04/2017 CLINICAL  DATA:  RIGHT shoulder dislocation post reduction  EXAM: PORTABLE RIGHT SHOULDER COMPARISON:  03/04/2017 FINDINGS: Previously identified RIGHT glenohumeral dislocation appears reduced. Bones demineralized. AC joint alignment normal. Suspect prior rotator cuff repair. No definite fractures identified. Visualized RIGHT ribs intact. IMPRESSION: Reduction of previously identified RIGHT glenohumeral dislocation. Electronically Signed   By: Lavonia Dana M.D.   On: 03/04/2017 12:02       Assessment & Plan:   Problem List Items Addressed This Visit    Anemia    Follow cbc.       Aortic aneurysm [facility])    Has been followed by cardiology.       GERD (gastroesophageal reflux disease)    Controlled on current regimen.        Hand numbness    Is some better with wearing the splint.  Planning for NCS 12/2017.        Hypercholesterolemia    On crestor.  Low cholesterol diet and exercise.  Follow lipid panel and liver function tests.        Relevant Orders   Hepatic function panel   Lipid panel   Hypertension    Back on losartan.  Blood pressure on recheck as outlined.  Follow pressures.  Same medication.  Follow metabolic panel.       Relevant Orders   Basic metabolic panel    Other Visit Diagnoses    Breast cancer screening    -  Primary   Relevant Orders   MM Digital Screening   Nasal congestion       Feels better.  Some persistent congestion.  Add astelin nasal spray.  Follow.    Hyperglycemia       Relevant Orders   Hemoglobin A1c       Einar Pheasant, MD

## 2017-11-27 ENCOUNTER — Encounter: Payer: Self-pay | Admitting: Internal Medicine

## 2017-11-27 NOTE — Assessment & Plan Note (Signed)
Is some better with wearing the splint.  Planning for NCS 12/2017.

## 2017-11-27 NOTE — Assessment & Plan Note (Signed)
Controlled on current regimen.   

## 2017-11-27 NOTE — Assessment & Plan Note (Signed)
On crestor.  Low cholesterol diet and exercise.  Follow lipid panel and liver function tests.   

## 2017-11-27 NOTE — Assessment & Plan Note (Signed)
Follow cbc.  

## 2017-11-27 NOTE — Assessment & Plan Note (Signed)
Has been followed by cardiology.  

## 2017-11-27 NOTE — Assessment & Plan Note (Signed)
Back on losartan.  Blood pressure on recheck as outlined.  Follow pressures.  Same medication.  Follow metabolic panel.

## 2017-12-08 ENCOUNTER — Ambulatory Visit (INDEPENDENT_AMBULATORY_CARE_PROVIDER_SITE_OTHER): Payer: Medicare Other | Admitting: Neurology

## 2017-12-08 ENCOUNTER — Encounter: Payer: Self-pay | Admitting: Neurology

## 2017-12-08 DIAGNOSIS — G5603 Carpal tunnel syndrome, bilateral upper limbs: Secondary | ICD-10-CM

## 2017-12-08 HISTORY — DX: Carpal tunnel syndrome, bilateral upper limbs: G56.03

## 2017-12-08 NOTE — Progress Notes (Signed)
[place]    Nerve / Sites Muscle Latency Ref. Amplitude Ref. Rel Amp Segments Distance Velocity Ref. Area    ms ms mV mV %  cm m/s m/s mVms  L Median - APB     Wrist APB 4.2 ?4.4 5.4 ?4.0 100 Wrist - APB 7   16.5     Upper arm APB 8.2  4.4  82.2 Upper arm - Wrist 22 54 ?49 15.9  R Median - APB     Wrist APB 5.9 ?4.4 3.0 ?4.0 100 Wrist - APB 7   11.0     Upper arm APB 10.1  3.0  99.8 Upper arm - Wrist 22 52 ?49 11.0  L Ulnar - ADM     Wrist ADM 2.7 ?3.3 13.4 ?6.0 100 Wrist - ADM 7   28.3     B.Elbow ADM 5.9  11.6  87 B.Elbow - Wrist 18 57 ?49 29.3     A.Elbow ADM 7.2  10.6  91.3 A.Elbow - B.Elbow 8 59 ?49 28.0         A.Elbow - Wrist      R Ulnar - ADM     Wrist ADM 2.4 ?3.3 11.5 ?6.0 100 Wrist - ADM 7   24.0     B.Elbow ADM 5.5  9.9  86.7 B.Elbow - Wrist 18 59 ?49 23.8     A.Elbow ADM 7.2  9.1  91.9 A.Elbow - B.Elbow 10 56 ?49 22.5         A.Elbow - Wrist                 SNC    Nerve / Sites Rec. Site Peak Lat Ref.  Amp Ref. Segments Distance Peak Diff Ref.    ms ms V V  cm ms ms  L Median, Ulnar - Transcarpal comparison     Median Palm Wrist 2.8 ?2.2 55 ?35 Median Palm - Wrist 8       Ulnar Palm Wrist 2.0 ?2.2 33 ?12 Ulnar Palm - Wrist 8          Median Palm - Ulnar Palm  0.8 ?0.4  L Median - Orthodromic (Dig II, Mid palm)     Dig II Wrist 3.8 ?3.4 15 ?10 Dig II - Wrist 13    R Median - Orthodromic (Dig II, Mid palm)     Dig II Wrist 5.3 ?3.4 5 ?10 Dig II - Wrist 13    L Ulnar - Orthodromic, (Dig V, Mid palm)     Dig V Wrist 2.8 ?3.1 11 ?5 Dig V - Wrist 11    R Ulnar - Orthodromic, (Dig V, Mid palm)     Dig V Wrist 2.6 ?3.1 8 ?5 Dig V - Wrist 46                 F  Wave    Nerve F Lat Ref.   ms ms  L Ulnar - ADM 25.8 ?32.0  R Ulnar - ADM 25.6 ?32.0         EMG full

## 2017-12-08 NOTE — Progress Notes (Signed)
Please call and notify pt that her nerve conduction studies reveal carpal tunnel syndrome (right > left).  Given findings on test and given symptoms, can refer to ortho for further evaluation and treatment.  Let me know if agreeable and let me know if has preference of which ortho she wants to see.

## 2017-12-08 NOTE — Procedures (Signed)
     HISTORY:  Leah Huber is a 77 year old patient with a history of prior right carpal tunnel syndrome surgery.  She has also had a dislocation of the right shoulder in the summer 2018.  The patient has noted over the last several months that she has developed burning and stinging sensations and numbness in the right hand may wake her up at night.  She is being evaluated for a possible neuropathy or a cervical radiculopathy.  NERVE CONDUCTION STUDIES:  Nerve conduction studies were performed on both upper extremities.  The distal motor latencies for the median nerves were prolonged bilaterally with a low motor amplitude on the right, normal on the left.  The distal motor latencies and motor amplitudes for the ulnar nerves were normal bilaterally.  The nerve conduction velocities for the median and ulnar nerves were normal bilaterally.  The sensory latencies for the median nerves were prolonged bilaterally, normal for the ulnar nerves bilaterally.  The F-wave latencies for the ulnar nerves were normal bilaterally.  EMG STUDIES:  EMG study was performed on the right upper extremity:  The first dorsal interosseous muscle reveals 2 to 4 K units with full recruitment. No fibrillations or positive waves were noted. The abductor pollicis brevis muscle reveals 2 to 4 K units with very slightly reduced recruitment. No fibrillations or positive waves were noted. The extensor indicis proprius muscle reveals 1 to 3 K units with full recruitment. No fibrillations or positive waves were noted. The pronator teres muscle reveals 2 to 3 K units with full recruitment. No fibrillations or positive waves were noted. The biceps muscle reveals 1 to 2 K units with full recruitment. No fibrillations or positive waves were noted. The triceps muscle reveals 2 to 4 K units with full recruitment. No fibrillations or positive waves were noted.  Complex repetitive discharges were seen. The anterior deltoid muscle reveals  2 to 3 K units with full recruitment. No fibrillations or positive waves were noted. The cervical paraspinal muscles were tested at 2 levels. No abnormalities of insertional activity were seen at either level tested. There was fair relaxation.   IMPRESSION:  Nerve conduction studies done on both upper extremities shows evidence of bilateral carpal tunnel syndrome, of moderate severity on the right and mild severity on the left.  EMG evaluation of the right upper extremity was unremarkable without evidence of an overlying cervical radiculopathy.  Jill Alexanders MD 12/08/2017 3:16 PM  [facility] [address] [place], [place] [address]  Phone [phone] Fax [phone]

## 2017-12-08 NOTE — Progress Notes (Signed)
Please refer to EMG and nerve conduction study procedure note. 

## 2017-12-10 ENCOUNTER — Encounter: Payer: Self-pay | Admitting: *Deleted

## 2017-12-10 NOTE — Progress Notes (Signed)
Sent pt a mychart message. 

## 2017-12-30 ENCOUNTER — Ambulatory Visit
Admission: RE | Admit: 2017-12-30 | Discharge: 2017-12-30 | Disposition: A | Discharge status: Home / Self Care | Payer: Medicare Other | Source: Ambulatory Visit | Attending: Internal Medicine | Admitting: Internal Medicine

## 2017-12-30 DIAGNOSIS — Z1239 Encounter for other screening for malignant neoplasm of breast: Secondary | ICD-10-CM

## 2017-12-30 DIAGNOSIS — Z1231 Encounter for screening mammogram for malignant neoplasm of breast: Secondary | ICD-10-CM | POA: Insufficient documentation

## 2018-02-03 ENCOUNTER — Other Ambulatory Visit: Payer: Self-pay | Admitting: Internal Medicine

## 2018-03-15 ENCOUNTER — Other Ambulatory Visit (INDEPENDENT_AMBULATORY_CARE_PROVIDER_SITE_OTHER): Payer: Medicare Other

## 2018-03-15 DIAGNOSIS — I10 Essential (primary) hypertension: Secondary | ICD-10-CM | POA: Diagnosis not present

## 2018-03-15 DIAGNOSIS — E7800 Pure hypercholesterolemia, unspecified: Secondary | ICD-10-CM

## 2018-03-15 DIAGNOSIS — R739 Hyperglycemia, unspecified: Secondary | ICD-10-CM

## 2018-03-15 LAB — BASIC METABOLIC PANEL
BUN: 19 mg/dL (ref 6–23)
CO2: 26 mEq/L (ref 19–32)
Calcium: 9.5 mg/dL (ref 8.4–10.5)
Chloride: 106 mEq/L (ref 96–112)
Creatinine, Ser: 0.84 mg/dL (ref 0.40–1.20)
GFR: 69.64 mL/min (ref >60.00)
GLUCOSE: 89 mg/dL (ref 70–99)
POTASSIUM: 4.7 meq/L (ref 3.5–5.1)
SODIUM: 140 meq/L (ref 135–145)

## 2018-03-15 LAB — HEPATIC FUNCTION PANEL
ALT: 17 U/L (ref 0–35)
AST: 27 U/L (ref 0–37)
Albumin: 4.5 g/dL (ref 3.5–5.2)
Alkaline Phosphatase: 43 U/L (ref 39–117)
Bilirubin, Direct: 0.1 mg/dL (ref 0.0–0.3)
TOTAL PROTEIN: 6.9 g/dL (ref 6.0–8.3)
Total Bilirubin: 0.5 mg/dL (ref 0.2–1.2)

## 2018-03-15 LAB — LIPID PANEL
Cholesterol: 185 mg/dL (ref 0–200)
HDL: 107 mg/dL (ref >39.00)
LDL Cholesterol: 59 mg/dL (ref 0–99)
NONHDL: 77.75
Total CHOL/HDL Ratio: 2
Triglycerides: 94 mg/dL (ref 0.0–149.0)
VLDL: 18.8 mg/dL (ref 0.0–40.0)

## 2018-03-15 LAB — HEMOGLOBIN A1C: Hgb A1c MFr Bld: 5.6 % (ref 4.6–6.5)

## 2018-03-16 ENCOUNTER — Encounter: Payer: Self-pay | Admitting: Internal Medicine

## 2018-03-17 ENCOUNTER — Encounter: Payer: Self-pay | Admitting: Internal Medicine

## 2018-03-17 ENCOUNTER — Ambulatory Visit (INDEPENDENT_AMBULATORY_CARE_PROVIDER_SITE_OTHER): Payer: Medicare Other | Admitting: Internal Medicine

## 2018-03-17 VITALS — BP 132/78 | HR 62 | Ht 64.25 in | Wt 154.8 lb

## 2018-03-17 DIAGNOSIS — I712 Thoracic aortic aneurysm, without rupture, unspecified: Secondary | ICD-10-CM

## 2018-03-17 DIAGNOSIS — E7800 Pure hypercholesterolemia, unspecified: Secondary | ICD-10-CM | POA: Diagnosis not present

## 2018-03-17 DIAGNOSIS — K219 Gastro-esophageal reflux disease without esophagitis: Secondary | ICD-10-CM | POA: Diagnosis not present

## 2018-03-17 DIAGNOSIS — R739 Hyperglycemia, unspecified: Secondary | ICD-10-CM

## 2018-03-17 DIAGNOSIS — I10 Essential (primary) hypertension: Secondary | ICD-10-CM | POA: Diagnosis not present

## 2018-03-17 DIAGNOSIS — R002 Palpitations: Secondary | ICD-10-CM | POA: Diagnosis not present

## 2018-03-17 DIAGNOSIS — G5603 Carpal tunnel syndrome, bilateral upper limbs: Secondary | ICD-10-CM

## 2018-03-17 DIAGNOSIS — D649 Anemia, unspecified: Secondary | ICD-10-CM

## 2018-03-17 NOTE — Progress Notes (Signed)
Patient ID: Leah Huber, female   DOB: 1941-01-13, 77 y.o.   MRN: [id number]   Subjective:    Patient ID: Leah Huber, female    DOB: 06-26-1941, 77 y.o.   MRN: [id number]  HPI  Patient here for a scheduled follow up.  She reports she is doing relatively well.  Still having issues with her hands.  Right worse.  Evaluated by neurology.  NCS - revealed CTS.  Has tried splints.  Request referral to Dr Amedeo Plenty.  No chest pain.  Does report some intermittent heart fluttering.  No sob.  No dizziness.  No syncope or near syncopal episodes.  No known triggers.  No abdominal pain.  Bowels moving.  Discussed recent labs.     Past Medical History:  Diagnosis Date  . Anemia   . Aortic aneurysm (HCC)    "SMALL"  . Arthritis    neck to tailbone  . Bilateral carpal tunnel syndrome 12/08/2017  . Bronchitis   . Chronic cough    @ NIGHT/ SEASONAL ALLERGIES  . Clostridium difficile infection 2013-2014  . Colon polyp   . Coronary artery disease    Dr Nehemiah Massed cardiologist  . Depression   . Diverticulosis   . GERD (gastroesophageal reflux disease)   . Glaucoma   . Hypercholesterolemia   . Hypertension    CONTROLLED ON MEDS  . Hypovolemia 12/10   acute renal failure  . IBS (irritable bowel syndrome)   . Neuromuscular disorder ([place])    occasionally tingling in hands/ NUMBNESS IN RIGHT FOOT s/p back surgery  . Osteoarthritis    bilateral knees  . Osteopenia   . Pneumonia    HX OF  . Sleep apnea    very mild   sleep study 8 yrs ago does not use CPAP  . Urinary incontinence    Past Surgical History:  Procedure Laterality Date  . ABDOMINAL HYSTERECTOMY  1979  . ANTERIOR CERVICAL DECOMP/DISCECTOMY FUSION N/A 11/28/2014   Procedure: CERVICAL FIVE-SIX  ANTERIOR CERVICAL DECOMPRESSION/DISCECTOMY FUSION 1 LEVEL;  Surgeon: Newman Pies, MD;  Location: [facility];  Service: Neurosurgery;  Laterality: N/A;  C56 anterior cervical decompression with fusion interbody prosthesis plating and  bonegraft  . BACK SURGERY     X2/ L4,5 & S1/ Cervical C7?  . BLADDER SURGERY  1981   SUSPENSION  . BREAST REDUCTION SURGERY    . CARPAL TUNNEL RELEASE  2007  . CATARACT EXTRACTION W/PHACO Right 12/30/2016   Procedure: CATARACT EXTRACTION PHACO AND INTRAOCULAR LENS PLACEMENT (I[facility])  Right toric lens;  Surgeon: Leandrew Koyanagi, MD;  Location: [facility];  Service: Ophthalmology;  Laterality: Right;  sleep apnea, does not use CPAP  . CATARACT EXTRACTION W/PHACO Left 01/20/2017   Procedure: CATARACT EXTRACTION PHACO AND INTRAOCULAR LENS PLACEMENT ([place])  Left Toric;  Surgeon: Leandrew Koyanagi, MD;  Location: [facility];  Service: Ophthalmology;  Laterality: Left;  toric lens  . CHOLECYSTECTOMY  1996  . COLONOSCOPY    . FOOT SURGERY  2005  . [place]  . REDUCTION MAMMAPLASTY Bilateral 2001  . [place] & 2004   left and right  . SPINAL CORD STIMULATOR IMPLANT  03/1997  . SPINAL CORD STIMULATOR REMOVAL  2007  . Tendon repair right shoulder    . TOTAL KNEE ARTHROPLASTY  10/2011 & 09/13   left and right   Family History  Problem Relation Age of Onset  . Heart disease Mother   . Stroke Mother   .  Hypertension Mother   . Hyperlipidemia Father   . Heart disease Father   . Heart disease Brother        2 brothers  . Alzheimer's disease Brother   . Heart disease Sister        pacemaker  . Breast cancer Daughter 65   Social History   Socioeconomic History  . Marital status: Widowed    Spouse name: Not on file  . Number of children: 2  . Years of education: Not on file  . Highest education level: Not on file  Occupational History  . Not on file  Social Needs  . Financial resource strain: Not on file  . Food insecurity:    Worry: Not on file    Inability: Not on file  . Transportation needs:    Medical: Not on file    Non-medical: Not on file  Tobacco Use  . Smoking status: Former Smoker    Packs/day: 1.00    Years: 20.00      Pack years: 20.00    Types: Cigarettes    Last attempt to quit: 09/07/1988    Years since quitting: 29.5  . Smokeless tobacco: Never Used  Substance and Sexual Activity  . Alcohol use: Yes    Alcohol/week: 1.2 oz    Types: 2 Glasses of wine per week    Comment: occasional  . Drug use: No  . Sexual activity: Never  Lifestyle  . Physical activity:    Days per week: Not on file    Minutes per session: Not on file  . Stress: Not on file  Relationships  . Social connections:    Talks on phone: Not on file    Gets together: Not on file    Attends religious service: Not on file    Active member of club or organization: Not on file    Attends meetings of clubs or organizations: Not on file    Relationship status: Not on file  Other Topics Concern  . Not on file  Social History Narrative   She is widowed, has two daughters.    Outpatient Encounter Medications as of 03/17/2018  Medication Sig  . albuterol (PROVENTIL HFA;VENTOLIN HFA) 108 (90 Base) MCG/ACT inhaler INL 2 PFS PO QID PRF SOB OR WHZ OR COUGH  . azelastine (ASTELIN) 0.1 % nasal spray Place 1 spray into both nostrils 2 (two) times daily. Use in each nostril as directed  . DETROL LA 4 MG 24 hr capsule TAKE 1 CAPSULE DAILY  . latanoprost (XALATAN) 0.005 % ophthalmic solution Place 1 drop into both eyes at bedtime.   Marland Kitchen losartan (COZAAR) 50 MG tablet TAKE 1 TABLET DAILY  . pantoprazole (PROTONIX) 40 MG tablet TAKE 1 TABLET TWICE A DAY  . rosuvastatin (CRESTOR) 20 MG tablet TAKE 1 TABLET DAILY  . [DISCONTINUED] losartan (COZAAR) 50 MG tablet Take 1 tablet (50 mg total) by mouth daily.   No facility-administered encounter medications on file as of 03/17/2018.     Review of Systems  Constitutional: Negative for appetite change and unexpected weight change.  HENT: Negative for congestion and sinus pressure.   Respiratory: Negative for cough, chest tightness and shortness of breath.   Cardiovascular: Positive for palpitations.  Negative for chest pain and leg swelling.       Heart fluttering as outlined.    Gastrointestinal: Negative for abdominal pain, diarrhea, nausea and vomiting.  Genitourinary: Negative for difficulty urinating and dysuria.  Musculoskeletal: Negative for myalgias.  Persistent hand pain and discomfort - numbness.    Skin: Negative for color change and rash.  Neurological: Negative for dizziness, light-headedness and headaches.  Psychiatric/Behavioral: Negative for agitation and dysphoric mood.       Objective:    Physical Exam  Constitutional: She appears well-developed and well-nourished. No distress.  HENT:  Nose: Nose normal.  Mouth/Throat: Oropharynx is clear and moist.  Neck: Neck supple. No thyromegaly present.  Cardiovascular: Normal rate and regular rhythm.  Pulmonary/Chest: Breath sounds normal. No respiratory distress. She has no wheezes.  Abdominal: Soft. Bowel sounds are normal. There is no tenderness.  Musculoskeletal: She exhibits no edema or tenderness.  Lymphadenopathy:    She has no cervical adenopathy.  Skin: No rash noted. No erythema.  Psychiatric: She has a normal mood and affect. Her behavior is normal.    BP 132/78 (BP Location: Left Arm, Patient Position: Sitting, Cuff Size: Normal)   Pulse 62   Ht 5' 4.25" (1.632 m)   Wt 154 lb 12.8 oz (70.2 kg)   LMP 08/15/1978   SpO2 98%   BMI 26.36 kg/m  Wt Readings from Last 3 Encounters:  03/17/18 154 lb 12.8 oz (70.2 kg)  11/24/17 157 lb 6.4 oz (71.4 kg)  10/26/17 157 lb (71.2 kg)     Lab Results  Component Value Date   WBC 6.3 04/08/2017   HGB 13.0 04/08/2017   HCT 39.6 04/08/2017   PLT 296.0 04/08/2017   GLUCOSE 89 03/15/2018   CHOL 185 03/15/2018   TRIG 94.0 03/15/2018   HDL 107.00 03/15/2018   LDLDIRECT 147.1 05/30/2013   LDLCALC 59 03/15/2018   ALT 17 03/15/2018   AST 27 03/15/2018   NA 140 03/15/2018   K 4.7 03/15/2018   CL 106 03/15/2018   CREATININE 0.84 03/15/2018   BUN 19  03/15/2018   CO2 26 03/15/2018   TSH 2.35 04/08/2017   INR 1.0 10/16/2013   HGBA1C 5.6 03/15/2018    Mm 3d Screen Breast Bilateral  Result Date: 12/31/2017 CLINICAL DATA:  Screening. EXAM: DIGITAL SCREENING BILATERAL MAMMOGRAM WITH TOMO AND CAD COMPARISON:  Previous exam(s). ACR Breast Density Category b: There are scattered areas of fibroglandular density. FINDINGS: There are no findings suspicious for malignancy. Images were processed with CAD. IMPRESSION: No mammographic evidence of malignancy. A result letter of this screening mammogram will be mailed directly to the patient. RECOMMENDATION: Screening mammogram in one year. (Code:SM-B-01Y) BI-RADS CATEGORY  1: Negative. Electronically Signed   By: Fidela Salisbury M.D.   On: 12/31/2017 10:36       Assessment & Plan:   Problem List Items Addressed This Visit    Anemia    Follow cbc.        Aortic aneurysm [facility])    Has been followed by cardiology.  States due f/u soon.        Bilateral carpal tunnel syndrome    She reports increased and persistent problems with her right hand more than left.  Has tried splints.  NCS confirmed right worse.  Refer to Dr Amedeo Plenty for evaluation/treatment.        Relevant Orders   Ambulatory referral to Orthopedic Surgery   GERD (gastroesophageal reflux disease)    Controlled on current regimen.  Follow.        Hypercholesterolemia    On crestor.  Low cholesterol diet and exercise.  Follow lipid panel and liver function tests.        Relevant Orders   Lipid panel  Hepatic function panel   Hypertension    Blood pressure under good control.  Continue same medication regimen.  Follow pressures.  Follow metabolic panel.        Relevant Orders   CBC with Differential/Platelet   TSH   Basic metabolic panel   Palpitations - Primary    Given palpitations and risk factors, EKG - SR with no acute ischemic changes.  Due f/u with cardiology soon.  Will go ahead and schedule f/u to confirm if any  further cardiac w/up warranted.  Also f/u on aortic aneurysm.  Pt comfortable with this plan.  Discussed avoiding increased caffeine, stimulants, etc.        Relevant Orders   EKG 12-Lead (Completed)   Ambulatory referral to Cardiology    Other Visit Diagnoses    Hyperglycemia       Relevant Orders   Hemoglobin A1c       Einar Pheasant, MD

## 2018-03-20 ENCOUNTER — Encounter: Payer: Self-pay | Admitting: Internal Medicine

## 2018-03-20 DIAGNOSIS — R002 Palpitations: Secondary | ICD-10-CM | POA: Insufficient documentation

## 2018-03-20 NOTE — Assessment & Plan Note (Signed)
Controlled on current regimen.  Follow.  

## 2018-03-20 NOTE — Assessment & Plan Note (Signed)
She reports increased and persistent problems with her right hand more than left.  Has tried splints.  NCS confirmed right worse.  Refer to Dr Amedeo Plenty for evaluation/treatment.

## 2018-03-20 NOTE — Assessment & Plan Note (Signed)
Given palpitations and risk factors, EKG - SR with no acute ischemic changes.  Due f/u with cardiology soon.  Will go ahead and schedule f/u to confirm if any further cardiac w/up warranted.  Also f/u on aortic aneurysm.  Pt comfortable with this plan.  Discussed avoiding increased caffeine, stimulants, etc.

## 2018-03-20 NOTE — Assessment & Plan Note (Signed)
On crestor.  Low cholesterol diet and exercise.  Follow lipid panel and liver function tests.   

## 2018-03-20 NOTE — Assessment & Plan Note (Signed)
Has been followed by cardiology.  States due f/u soon.

## 2018-03-20 NOTE — Assessment & Plan Note (Signed)
Follow cbc.  

## 2018-03-20 NOTE — Assessment & Plan Note (Signed)
Blood pressure under good control.  Continue same medication regimen.  Follow pressures.  Follow metabolic panel.   

## 2018-03-22 ENCOUNTER — Encounter (INDEPENDENT_AMBULATORY_CARE_PROVIDER_SITE_OTHER): Payer: Self-pay

## 2018-03-23 ENCOUNTER — Telehealth: Payer: Self-pay | Admitting: Neurology

## 2018-03-23 NOTE — Telephone Encounter (Signed)
Records faxed to Dr Amedeo Plenty office (f) [phone]

## 2018-03-26 ENCOUNTER — Other Ambulatory Visit: Payer: Self-pay | Admitting: Internal Medicine

## 2018-03-27 ENCOUNTER — Other Ambulatory Visit: Payer: Self-pay | Admitting: Internal Medicine

## 2018-03-29 ENCOUNTER — Other Ambulatory Visit: Payer: Self-pay | Admitting: Nurse Practitioner

## 2018-03-29 DIAGNOSIS — R002 Palpitations: Secondary | ICD-10-CM | POA: Diagnosis not present

## 2018-03-29 DIAGNOSIS — I712 Thoracic aortic aneurysm, without rupture: Secondary | ICD-10-CM | POA: Diagnosis not present

## 2018-03-29 DIAGNOSIS — E782 Mixed hyperlipidemia: Secondary | ICD-10-CM | POA: Diagnosis not present

## 2018-03-29 DIAGNOSIS — I10 Essential (primary) hypertension: Secondary | ICD-10-CM | POA: Diagnosis not present

## 2018-03-29 DIAGNOSIS — I2510 Atherosclerotic heart disease of native coronary artery without angina pectoris: Secondary | ICD-10-CM | POA: Diagnosis not present

## 2018-03-29 DIAGNOSIS — I7121 Aneurysm of the ascending aorta, without rupture: Secondary | ICD-10-CM

## 2018-03-29 DIAGNOSIS — I6523 Occlusion and stenosis of bilateral carotid arteries: Secondary | ICD-10-CM | POA: Diagnosis not present

## 2018-04-04 ENCOUNTER — Ambulatory Visit
Admission: RE | Admit: 2018-04-04 | Discharge: 2018-04-04 | Disposition: A | Discharge status: Home / Self Care | Payer: Medicare Other | Source: Ambulatory Visit | Attending: Nurse Practitioner | Admitting: Nurse Practitioner

## 2018-04-04 DIAGNOSIS — I712 Thoracic aortic aneurysm, without rupture: Secondary | ICD-10-CM | POA: Insufficient documentation

## 2018-04-04 DIAGNOSIS — R911 Solitary pulmonary nodule: Secondary | ICD-10-CM | POA: Diagnosis not present

## 2018-04-04 DIAGNOSIS — I700 Atherosclerosis of aorta: Secondary | ICD-10-CM | POA: Insufficient documentation

## 2018-04-04 DIAGNOSIS — I7121 Aneurysm of the ascending aorta, without rupture: Secondary | ICD-10-CM

## 2018-04-04 DIAGNOSIS — R002 Palpitations: Secondary | ICD-10-CM | POA: Diagnosis not present

## 2018-04-04 MED ORDER — IOPAMIDOL (ISOVUE-370) INJECTION 76%
75.0000 mL | Freq: Once | INTRAVENOUS | Status: AC | PRN
  Administered 2018-04-04: 75 mL via INTRAVENOUS

## 2018-04-12 DIAGNOSIS — I10 Essential (primary) hypertension: Secondary | ICD-10-CM | POA: Diagnosis not present

## 2018-04-12 DIAGNOSIS — I712 Thoracic aortic aneurysm, without rupture: Secondary | ICD-10-CM | POA: Diagnosis not present

## 2018-04-12 DIAGNOSIS — R002 Palpitations: Secondary | ICD-10-CM | POA: Diagnosis not present

## 2018-04-12 DIAGNOSIS — I2510 Atherosclerotic heart disease of native coronary artery without angina pectoris: Secondary | ICD-10-CM | POA: Diagnosis not present

## 2018-04-12 DIAGNOSIS — I6523 Occlusion and stenosis of bilateral carotid arteries: Secondary | ICD-10-CM | POA: Diagnosis not present

## 2018-04-28 ENCOUNTER — Other Ambulatory Visit: Payer: Self-pay | Admitting: Internal Medicine

## 2018-05-04 DIAGNOSIS — G5603 Carpal tunnel syndrome, bilateral upper limbs: Secondary | ICD-10-CM | POA: Diagnosis not present

## 2018-05-04 DIAGNOSIS — M79641 Pain in right hand: Secondary | ICD-10-CM | POA: Diagnosis not present

## 2018-05-04 DIAGNOSIS — M79642 Pain in left hand: Secondary | ICD-10-CM | POA: Diagnosis not present

## 2018-05-05 ENCOUNTER — Other Ambulatory Visit: Payer: Self-pay | Admitting: Internal Medicine

## 2018-05-19 DIAGNOSIS — H401131 Primary open-angle glaucoma, bilateral, mild stage: Secondary | ICD-10-CM | POA: Diagnosis not present

## 2018-05-20 DIAGNOSIS — G5601 Carpal tunnel syndrome, right upper limb: Secondary | ICD-10-CM | POA: Diagnosis not present

## 2018-06-03 DIAGNOSIS — M79641 Pain in right hand: Secondary | ICD-10-CM | POA: Diagnosis not present

## 2018-07-19 ENCOUNTER — Other Ambulatory Visit (INDEPENDENT_AMBULATORY_CARE_PROVIDER_SITE_OTHER): Payer: Medicare Other

## 2018-07-19 DIAGNOSIS — R739 Hyperglycemia, unspecified: Secondary | ICD-10-CM | POA: Diagnosis not present

## 2018-07-19 DIAGNOSIS — E7800 Pure hypercholesterolemia, unspecified: Secondary | ICD-10-CM

## 2018-07-19 DIAGNOSIS — I10 Essential (primary) hypertension: Secondary | ICD-10-CM | POA: Diagnosis not present

## 2018-07-19 LAB — BASIC METABOLIC PANEL
BUN: 20 mg/dL (ref 6–23)
CALCIUM: 9.9 mg/dL (ref 8.4–10.5)
CO2: 27 mEq/L (ref 19–32)
Chloride: 103 mEq/L (ref 96–112)
Creatinine, Ser: 0.92 mg/dL (ref 0.40–1.20)
GFR: 62.65 mL/min (ref >60.00)
GLUCOSE: 91 mg/dL (ref 70–99)
POTASSIUM: 4.4 meq/L (ref 3.5–5.1)
Sodium: 142 mEq/L (ref 135–145)

## 2018-07-19 LAB — CBC WITH DIFFERENTIAL/PLATELET
BASOS PCT: 0.8 % (ref 0.0–3.0)
Basophils Absolute: 0.1 10*3/uL (ref 0.0–0.1)
EOS PCT: 3 % (ref 0.0–5.0)
Eosinophils Absolute: 0.2 10*3/uL (ref 0.0–0.7)
HCT: 36.7 % (ref 36.0–46.0)
Hemoglobin: 12.4 g/dL (ref 12.0–15.0)
LYMPHS ABS: 3.1 10*3/uL (ref 0.7–4.0)
Lymphocytes Relative: 46.6 % — ABNORMAL HIGH (ref 12.0–46.0)
MCHC: 33.7 g/dL (ref 30.0–36.0)
MCV: 100.9 fl — AB (ref 78.0–100.0)
MONOS PCT: 6.9 % (ref 3.0–12.0)
Monocytes Absolute: 0.5 10*3/uL (ref 0.1–1.0)
NEUTROS ABS: 2.8 10*3/uL (ref 1.4–7.7)
NEUTROS PCT: 42.7 % — AB (ref 43.0–77.0)
PLATELETS: 276 10*3/uL (ref 150.0–400.0)
RBC: 3.63 Mil/uL — AB (ref 3.87–5.11)
RDW: 13.7 % (ref 11.5–15.5)
WBC: 6.6 10*3/uL (ref 4.0–10.5)

## 2018-07-19 LAB — LIPID PANEL
Cholesterol: 205 mg/dL — ABNORMAL HIGH (ref 0–200)
HDL: 122 mg/dL (ref >39.00)
LDL Cholesterol: 67 mg/dL (ref 0–99)
NONHDL: 82.78
Total CHOL/HDL Ratio: 2
Triglycerides: 81 mg/dL (ref 0.0–149.0)
VLDL: 16.2 mg/dL (ref 0.0–40.0)

## 2018-07-19 LAB — TSH: TSH: 3 u[IU]/mL (ref 0.35–4.50)

## 2018-07-19 LAB — HEPATIC FUNCTION PANEL
ALBUMIN: 4.9 g/dL (ref 3.5–5.2)
ALT: 18 U/L (ref 0–35)
AST: 27 U/L (ref 0–37)
Alkaline Phosphatase: 52 U/L (ref 39–117)
Bilirubin, Direct: 0.1 mg/dL (ref 0.0–0.3)
Total Bilirubin: 0.7 mg/dL (ref 0.2–1.2)
Total Protein: 6.9 g/dL (ref 6.0–8.3)

## 2018-07-19 LAB — HEMOGLOBIN A1C: Hgb A1c MFr Bld: 5.3 % (ref 4.6–6.5)

## 2018-07-20 ENCOUNTER — Encounter: Payer: Self-pay | Admitting: Internal Medicine

## 2018-07-21 ENCOUNTER — Ambulatory Visit (INDEPENDENT_AMBULATORY_CARE_PROVIDER_SITE_OTHER): Payer: Medicare Other | Admitting: Internal Medicine

## 2018-07-21 ENCOUNTER — Encounter: Payer: Self-pay | Admitting: Internal Medicine

## 2018-07-21 VITALS — BP 118/78 | HR 81 | Temp 97.9°F | Resp 18 | Wt 154.4 lb

## 2018-07-21 DIAGNOSIS — R739 Hyperglycemia, unspecified: Secondary | ICD-10-CM

## 2018-07-21 DIAGNOSIS — I10 Essential (primary) hypertension: Secondary | ICD-10-CM

## 2018-07-21 DIAGNOSIS — R197 Diarrhea, unspecified: Secondary | ICD-10-CM

## 2018-07-21 DIAGNOSIS — D649 Anemia, unspecified: Secondary | ICD-10-CM

## 2018-07-21 DIAGNOSIS — K219 Gastro-esophageal reflux disease without esophagitis: Secondary | ICD-10-CM

## 2018-07-21 DIAGNOSIS — E7800 Pure hypercholesterolemia, unspecified: Secondary | ICD-10-CM

## 2018-07-21 DIAGNOSIS — I712 Thoracic aortic aneurysm, without rupture, unspecified: Secondary | ICD-10-CM

## 2018-07-21 DIAGNOSIS — R200 Anesthesia of skin: Secondary | ICD-10-CM

## 2018-07-21 DIAGNOSIS — G5603 Carpal tunnel syndrome, bilateral upper limbs: Secondary | ICD-10-CM | POA: Diagnosis not present

## 2018-07-21 DIAGNOSIS — D7589 Other specified diseases of blood and blood-forming organs: Secondary | ICD-10-CM

## 2018-07-21 DIAGNOSIS — Z1211 Encounter for screening for malignant neoplasm of colon: Secondary | ICD-10-CM | POA: Diagnosis not present

## 2018-07-21 NOTE — Patient Instructions (Signed)
Examples of probiotics:  Culturelle, align or florastor

## 2018-07-21 NOTE — Progress Notes (Addendum)
Patient ID: Leah Huber, female   DOB: 1940/09/12, 77 y.o.   MRN: [id number]   Subjective:    Patient ID: Leah Huber, female    DOB: [birth year], 77 y.o.   MRN: [id number]  HPI  Patient here for a scheduled follow up.  Saw cardiology 04/2018.  Stable.  Recommended f/u in 6 months.  She feels she is doing well.  No chest pain.  Breathing stable.  On protonix bid.   No abdominal pain.  Diarrhea last week.  Resolved.  Not a recurring problem.  Discussed taking a probiotic.  History of aortic aneurysm.  Stable.  Recommended f/u in one year.  Discussed shingrx.     Past Medical History:  Diagnosis Date  . Anemia   . Aortic aneurysm (HCC)    "SMALL"  . Arthritis    neck to tailbone  . Bilateral carpal tunnel syndrome 12/08/2017  . Bronchitis   . Chronic cough    @ NIGHT/ SEASONAL ALLERGIES  . Clostridium difficile infection 2013-2014  . Colon polyp   . Coronary artery disease    Dr Nehemiah Massed cardiologist  . Depression   . Diverticulosis   . GERD (gastroesophageal reflux disease)   . Glaucoma   . Hypercholesterolemia   . Hypertension    CONTROLLED ON MEDS  . Hypovolemia 12/10   acute renal failure  . IBS (irritable bowel syndrome)   . Neuromuscular disorder ([place])    occasionally tingling in hands/ NUMBNESS IN RIGHT FOOT s/p back surgery  . Osteoarthritis    bilateral knees  . Osteopenia   . Pneumonia    HX OF  . Sleep apnea    very mild   sleep study 8 yrs ago does not use CPAP  . Urinary incontinence    Past Surgical History:  Procedure Laterality Date  . ABDOMINAL HYSTERECTOMY  1979  . ANTERIOR CERVICAL DECOMP/DISCECTOMY FUSION N/A 11/28/2014   Procedure: CERVICAL FIVE-SIX  ANTERIOR CERVICAL DECOMPRESSION/DISCECTOMY FUSION 1 LEVEL;  Surgeon: Newman Pies, MD;  Location: [facility];  Service: Neurosurgery;  Laterality: N/A;  C56 anterior cervical decompression with fusion interbody prosthesis plating and bonegraft  . BACK SURGERY     X2/ L4,5 & S1/ Cervical C7?  .  BLADDER SURGERY  1981   SUSPENSION  . BREAST REDUCTION SURGERY    . CARPAL TUNNEL RELEASE  2007  . CATARACT EXTRACTION W/PHACO Right 12/30/2016   Procedure: CATARACT EXTRACTION PHACO AND INTRAOCULAR LENS PLACEMENT (IOC)  Right toric lens;  Surgeon: Leandrew Koyanagi, MD;  Location: [facility];  Service: Ophthalmology;  Laterality: Right;  sleep apnea, does not use CPAP  . CATARACT EXTRACTION W/PHACO Left 01/20/2017   Procedure: CATARACT EXTRACTION PHACO AND INTRAOCULAR LENS PLACEMENT ([place])  Left Toric;  Surgeon: Leandrew Koyanagi, MD;  Location: [facility];  Service: Ophthalmology;  Laterality: Left;  toric lens  . CHOLECYSTECTOMY  1996  . COLONOSCOPY    . FOOT SURGERY  2005  . [place]  . REDUCTION MAMMAPLASTY Bilateral 2001  . [place] & 2004   left and right  . SPINAL CORD STIMULATOR IMPLANT  03/1997  . SPINAL CORD STIMULATOR REMOVAL  2007  . Tendon repair right shoulder    . TOTAL KNEE ARTHROPLASTY  10/2011 & 09/13   left and right   Family History  Problem Relation Age of Onset  . Heart disease Mother   . Stroke Mother   . Hypertension Mother   . Hyperlipidemia  Father   . Heart disease Father   . Heart disease Brother        2 brothers  . Alzheimer's disease Brother   . Heart disease Sister        pacemaker  . Breast cancer Daughter 81   Social History   Socioeconomic History  . Marital status: Widowed    Spouse name: Not on file  . Number of children: 2  . Years of education: Not on file  . Highest education level: Not on file  Occupational History  . Not on file  Social Needs  . Financial resource strain: Not on file  . Food insecurity:    Worry: Not on file    Inability: Not on file  . Transportation needs:    Medical: Not on file    Non-medical: Not on file  Tobacco Use  . Smoking status: Former Smoker    Packs/day: 1.00    Years: 20.00    Pack years: 20.00    Types: Cigarettes    Last attempt to  quit: 09/07/1988    Years since quitting: 29.8  . Smokeless tobacco: Never Used  Substance and Sexual Activity  . Alcohol use: Yes    Alcohol/week: 2.0 standard drinks    Types: 2 Glasses of wine per week    Comment: occasional  . Drug use: No  . Sexual activity: Never  Lifestyle  . Physical activity:    Days per week: Not on file    Minutes per session: Not on file  . Stress: Not on file  Relationships  . Social connections:    Talks on phone: Not on file    Gets together: Not on file    Attends religious service: Not on file    Active member of club or organization: Not on file    Attends meetings of clubs or organizations: Not on file    Relationship status: Not on file  Other Topics Concern  . Not on file  Social History Narrative   She is widowed, has two daughters.    Outpatient Encounter Medications as of 07/21/2018  Medication Sig  . azelastine (ASTELIN) 0.1 % nasal spray Place 1 spray into both nostrils 2 (two) times daily. Use in each nostril as directed  . DETROL LA 4 MG 24 hr capsule TAKE 1 CAPSULE DAILY  . latanoprost (XALATAN) 0.005 % ophthalmic solution Place 1 drop into both eyes at bedtime.   Marland Kitchen losartan (COZAAR) 50 MG tablet TAKE 1 TABLET DAILY  . pantoprazole (PROTONIX) 40 MG tablet TAKE 1 TABLET TWICE A DAY  . rosuvastatin (CRESTOR) 20 MG tablet TAKE 1 TABLET DAILY  . [DISCONTINUED] albuterol (PROVENTIL HFA;VENTOLIN HFA) 108 (90 Base) MCG/ACT inhaler INL 2 PFS PO QID PRF SOB OR WHZ OR COUGH   No facility-administered encounter medications on file as of 07/21/2018.     Review of Systems  Constitutional: Negative for appetite change and unexpected weight change.  HENT: Negative for congestion and sinus pressure.   Respiratory: Negative for cough, chest tightness and shortness of breath.   Cardiovascular: Negative for chest pain, palpitations and leg swelling.  Gastrointestinal: Negative for abdominal pain, diarrhea, nausea and vomiting.  Genitourinary:  Negative for difficulty urinating and dysuria.  Musculoskeletal: Negative for joint swelling and myalgias.  Skin: Negative for color change and rash.  Neurological: Negative for dizziness, light-headedness and headaches.  Psychiatric/Behavioral: Negative for agitation and dysphoric mood.       Objective:    Physical Exam  Constitutional: She appears well-developed and well-nourished. No distress.  HENT:  Nose: Nose normal.  Mouth/Throat: Oropharynx is clear and moist.  Neck: Neck supple. No thyromegaly present.  Cardiovascular: Normal rate and regular rhythm.  Pulmonary/Chest: Breath sounds normal. No respiratory distress. She has no wheezes.  Abdominal: Soft. Bowel sounds are normal. There is no tenderness.  Musculoskeletal: She exhibits no edema or tenderness.  Lymphadenopathy:    She has no cervical adenopathy.  Skin: No rash noted. No erythema.  Psychiatric: She has a normal mood and affect. Her behavior is normal.    BP 118/78 (BP Location: Right Arm, Patient Position: Sitting, Cuff Size: Normal)   Pulse 81   Temp 97.9 F (36.6 C) (Oral)   Resp 18   Wt 154 lb 6.4 oz (70 kg)   LMP 08/15/1978   SpO2 98%   BMI 26.30 kg/m  Wt Readings from Last 3 Encounters:  07/21/18 154 lb 6.4 oz (70 kg)  03/17/18 154 lb 12.8 oz (70.2 kg)  11/24/17 157 lb 6.4 oz (71.4 kg)     Lab Results  Component Value Date   WBC 6.6 07/19/2018   HGB 12.4 07/19/2018   HCT 36.7 07/19/2018   PLT 276.0 07/19/2018   GLUCOSE 91 07/19/2018   CHOL 205 (H) 07/19/2018   TRIG 81.0 07/19/2018   HDL 122.00 07/19/2018   LDLDIRECT 147.1 05/30/2013   LDLCALC 67 07/19/2018   ALT 18 07/19/2018   AST 27 07/19/2018   NA 142 07/19/2018   K 4.4 07/19/2018   CL 103 07/19/2018   CREATININE 0.92 07/19/2018   BUN 20 07/19/2018   CO2 27 07/19/2018   TSH 3.00 07/19/2018   INR 1.0 10/16/2013   HGBA1C 5.3 07/19/2018    Ct Angio Chest Aorta W/cm &/or Wo/cm  Result Date: 04/04/2018 CLINICAL DATA:  Cardiac  palpitations. EXAM: CT ANGIOGRAPHY CHEST WITH CONTRAST TECHNIQUE: Multidetector CT imaging of the chest was performed using the standard protocol during bolus administration of intravenous contrast. Multiplanar CT image reconstructions and MIPs were obtained to evaluate the vascular anatomy. CONTRAST:  23mL ISOVUE-370 IOPAMIDOL (ISOVUE-370) INJECTION 76% COMPARISON:  04/16/2014 FINDINGS: Cardiovascular: Maximal diameter of the ascending aorta is 4.0 cm which is stable compared to the prior study. There is no evidence of dissection. There is no obvious acute intramural hematoma. Atherosclerotic calcifications affect the thoracic aorta and great vessels. The great vessels are patent. Left vertebral artery is patent. There are atherosclerotic calcifications at the origin of the right vertebral artery but there is no narrowing. There is no obvious acute pulmonary thromboembolism. Mild 3 vessel coronary artery calcification. There is some soft smooth plaque along the descending thoracic aorta. Mediastinum/Nodes: No abnormal mediastinal adenopathy or mass effect. Subcentimeter short axis diameter lymph nodes are noted. Esophagus is grossly within normal limits. Thyroid is unremarkable. No pericardial effusion. Lungs/Pleura: No pneumothorax or pleural effusion. Pleuroparenchymal scarring at the lung apices. Mild subsegmental atelectasis towards the lung bases. Stable left lower lobe 3 mm nodule on image 80 of series 5. No new nodule. Upper Abdomen: No acute abnormality. Musculoskeletal: No vertebral compression deformity. C6-7 cervical fusion is noted. Review of the MIP images confirms the above findings. 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 Recommend annual imaging followup by CTA or MRA. This recommendation follows 2010 ACCF/AHA/AATS/ACR/ASA/SCA/SCAI/SIR/STS/SVM Guidelines for the Diagnosis and Management of Patients with Thoracic Aortic Disease. Circulation. 2010; 121: L275-T700  Stable 3 mm pulmonary nodule compared with 2015 supporting benign etiology. Aortic Atherosclerosis (ICD10-I70.0). Electronically  Signed   By: Marybelle Killings M.D.   On: 04/04/2018 09:04       Assessment & Plan:   Problem List Items Addressed This Visit    Anemia    Recent cbc wnl.  Recent mcv slightly elevated.  Follow.  Consider checking B12 and rbc folate.        Relevant Orders   CBC with Differential/Platelet   RBC Folate   Aortic aneurysm (HCC)    Recently evaluated.  Stable.  Recommended f/u in one year.        Bilateral carpal tunnel syndrome    S/p CTR 05/2018.  Some residual wrist swelling and tenderness. Continue f/u with ortho.        GERD (gastroesophageal reflux disease)    On protonix bid.  Due colonoscopy 2020.  Refer to GI.        Relevant Orders   Ambulatory referral to Gastroenterology   Hand numbness   Relevant Orders   Vitamin B12   Hypercholesterolemia    On crestor.  Low cholesterol diet and exercise.  Follow lipid panel and liver function tests.        Relevant Orders   Hepatic function panel   Lipid panel   Hypertension    Blood pressure under good control.  Continue same medication regimen.  Follow pressures.  Follow metabolic panel.        Relevant Orders   Basic metabolic panel    Other Visit Diagnoses    Diarrhea, unspecified type    -  Primary   Resolved. Feeling better.  Discussed probiotics.  Follow.     Hyperglycemia       Relevant Orders   Hemoglobin A1c   Macrocytosis       Colon cancer screening       Relevant Orders   Ambulatory referral to Gastroenterology       Einar Pheasant, MD

## 2018-07-24 ENCOUNTER — Encounter: Payer: Self-pay | Admitting: Internal Medicine

## 2018-07-24 NOTE — Assessment & Plan Note (Signed)
On crestor.  Low cholesterol diet and exercise.  Follow lipid panel and liver function tests.   

## 2018-07-24 NOTE — Assessment & Plan Note (Signed)
Recently evaluated.  Stable.  Recommended f/u in one year.

## 2018-07-24 NOTE — Assessment & Plan Note (Signed)
Blood pressure under good control.  Continue same medication regimen.  Follow pressures.  Follow metabolic panel.   

## 2018-07-24 NOTE — Assessment & Plan Note (Signed)
S/p CTR 05/2018.  Some residual wrist swelling and tenderness. Continue f/u with ortho.

## 2018-07-24 NOTE — Assessment & Plan Note (Signed)
On protonix bid.  Due colonoscopy 2020.  Refer to GI.

## 2018-07-24 NOTE — Assessment & Plan Note (Signed)
Recent cbc wnl.  Recent mcv slightly elevated.  Follow.  Consider checking B12 and rbc folate.

## 2018-07-24 NOTE — Addendum Note (Signed)
Addended by: Alisa Graff on: 07/24/2018 11:35 AM   Modules accepted: Orders

## 2018-09-05 DIAGNOSIS — I2510 Atherosclerotic heart disease of native coronary artery without angina pectoris: Secondary | ICD-10-CM | POA: Diagnosis not present

## 2018-09-05 DIAGNOSIS — K219 Gastro-esophageal reflux disease without esophagitis: Secondary | ICD-10-CM | POA: Diagnosis not present

## 2018-09-05 DIAGNOSIS — I712 Thoracic aortic aneurysm, without rupture: Secondary | ICD-10-CM | POA: Diagnosis not present

## 2018-09-05 DIAGNOSIS — Z86010 Personal history of colonic polyps: Secondary | ICD-10-CM | POA: Diagnosis not present

## 2018-09-18 DIAGNOSIS — M5412 Radiculopathy, cervical region: Secondary | ICD-10-CM | POA: Diagnosis not present

## 2018-09-20 ENCOUNTER — Encounter: Payer: Self-pay | Admitting: Internal Medicine

## 2018-09-20 DIAGNOSIS — G4730 Sleep apnea, unspecified: Secondary | ICD-10-CM

## 2018-09-20 NOTE — Telephone Encounter (Signed)
I can place the referral.  Is she willing to see pulmonary?  Thanks.

## 2018-09-21 NOTE — Telephone Encounter (Signed)
Order placed for pulmonary referral.  

## 2018-09-21 NOTE — Telephone Encounter (Signed)
Pt is agreeable to referral to pulmonary

## 2018-09-21 NOTE — Telephone Encounter (Signed)
I let her know that referral would be placed and someone from their office should contact her

## 2018-09-23 ENCOUNTER — Other Ambulatory Visit: Payer: Self-pay | Admitting: Internal Medicine

## 2018-09-23 ENCOUNTER — Other Ambulatory Visit (INDEPENDENT_AMBULATORY_CARE_PROVIDER_SITE_OTHER): Payer: Medicare Other

## 2018-09-23 DIAGNOSIS — R739 Hyperglycemia, unspecified: Secondary | ICD-10-CM | POA: Diagnosis not present

## 2018-09-23 DIAGNOSIS — I10 Essential (primary) hypertension: Secondary | ICD-10-CM | POA: Diagnosis not present

## 2018-09-23 DIAGNOSIS — D649 Anemia, unspecified: Secondary | ICD-10-CM | POA: Diagnosis not present

## 2018-09-23 DIAGNOSIS — E7800 Pure hypercholesterolemia, unspecified: Secondary | ICD-10-CM | POA: Diagnosis not present

## 2018-09-23 DIAGNOSIS — R200 Anesthesia of skin: Secondary | ICD-10-CM | POA: Diagnosis not present

## 2018-09-23 LAB — CBC WITH DIFFERENTIAL/PLATELET
Basophils Absolute: 0 10*3/uL (ref 0.0–0.1)
Basophils Relative: 0.4 % (ref 0.0–3.0)
Eosinophils Absolute: 0.1 10*3/uL (ref 0.0–0.7)
Eosinophils Relative: 1 % (ref 0.0–5.0)
HCT: 35.4 % — ABNORMAL LOW (ref 36.0–46.0)
Hemoglobin: 11.9 g/dL — ABNORMAL LOW (ref 12.0–15.0)
Lymphocytes Relative: 49.2 % — ABNORMAL HIGH (ref 12.0–46.0)
Lymphs Abs: 4.1 10*3/uL — ABNORMAL HIGH (ref 0.7–4.0)
MCHC: 33.7 g/dL (ref 30.0–36.0)
MCV: 100.3 fl — ABNORMAL HIGH (ref 78.0–100.0)
MONO ABS: 0.7 10*3/uL (ref 0.1–1.0)
Monocytes Relative: 8.1 % (ref 3.0–12.0)
Neutro Abs: 3.4 10*3/uL (ref 1.4–7.7)
Neutrophils Relative %: 41.3 % — ABNORMAL LOW (ref 43.0–77.0)
PLATELETS: 302 10*3/uL (ref 150.0–400.0)
RBC: 3.53 Mil/uL — ABNORMAL LOW (ref 3.87–5.11)
RDW: 13.7 % (ref 11.5–15.5)
WBC: 8.4 10*3/uL (ref 4.0–10.5)

## 2018-09-23 LAB — BASIC METABOLIC PANEL
BUN: 28 mg/dL — AB (ref 6–23)
CO2: 28 mEq/L (ref 19–32)
Calcium: 9.8 mg/dL (ref 8.4–10.5)
Chloride: 106 mEq/L (ref 96–112)
Creatinine, Ser: 1.02 mg/dL (ref 0.40–1.20)
GFR: 52.3 mL/min — ABNORMAL LOW (ref >60.00)
Glucose, Bld: 86 mg/dL (ref 70–99)
Potassium: 4.6 mEq/L (ref 3.5–5.1)
Sodium: 141 mEq/L (ref 135–145)

## 2018-09-23 LAB — HEPATIC FUNCTION PANEL
ALT: 16 U/L (ref 0–35)
AST: 17 U/L (ref 0–37)
Albumin: 4.2 g/dL (ref 3.5–5.2)
Alkaline Phosphatase: 42 U/L (ref 39–117)
BILIRUBIN TOTAL: 0.4 mg/dL (ref 0.2–1.2)
Bilirubin, Direct: 0.1 mg/dL (ref 0.0–0.3)
Total Protein: 6.5 g/dL (ref 6.0–8.3)

## 2018-09-23 LAB — LIPID PANEL
Cholesterol: 194 mg/dL (ref 0–200)
HDL: 104.4 mg/dL (ref >39.00)
LDL CALC: 67 mg/dL (ref 0–99)
NonHDL: 89.71
Total CHOL/HDL Ratio: 2
Triglycerides: 116 mg/dL (ref 0.0–149.0)
VLDL: 23.2 mg/dL (ref 0.0–40.0)

## 2018-09-23 LAB — HEMOGLOBIN A1C: Hgb A1c MFr Bld: 5.6 % (ref 4.6–6.5)

## 2018-09-23 LAB — VITAMIN B12: Vitamin B-12: 215 pg/mL (ref 211–911)

## 2018-09-25 LAB — FOLATE RBC: RBC Folate: 704 ng/mL RBC (ref >280)

## 2018-09-26 ENCOUNTER — Other Ambulatory Visit: Payer: Self-pay | Admitting: Internal Medicine

## 2018-09-26 DIAGNOSIS — R944 Abnormal results of kidney function studies: Secondary | ICD-10-CM

## 2018-09-26 NOTE — Progress Notes (Signed)
Order placed for f/u met b 

## 2018-09-27 DIAGNOSIS — E782 Mixed hyperlipidemia: Secondary | ICD-10-CM | POA: Diagnosis not present

## 2018-09-27 DIAGNOSIS — I480 Paroxysmal atrial fibrillation: Secondary | ICD-10-CM | POA: Diagnosis not present

## 2018-09-27 DIAGNOSIS — I10 Essential (primary) hypertension: Secondary | ICD-10-CM | POA: Diagnosis not present

## 2018-09-27 DIAGNOSIS — I2510 Atherosclerotic heart disease of native coronary artery without angina pectoris: Secondary | ICD-10-CM | POA: Diagnosis not present

## 2018-09-27 DIAGNOSIS — Z01818 Encounter for other preprocedural examination: Secondary | ICD-10-CM | POA: Diagnosis not present

## 2018-09-30 DIAGNOSIS — I480 Paroxysmal atrial fibrillation: Secondary | ICD-10-CM | POA: Diagnosis not present

## 2018-10-03 DIAGNOSIS — M5412 Radiculopathy, cervical region: Secondary | ICD-10-CM | POA: Diagnosis not present

## 2018-10-04 ENCOUNTER — Encounter: Payer: Self-pay | Admitting: Internal Medicine

## 2018-10-04 ENCOUNTER — Ambulatory Visit (INDEPENDENT_AMBULATORY_CARE_PROVIDER_SITE_OTHER): Payer: Medicare Other | Admitting: Internal Medicine

## 2018-10-04 VITALS — BP 128/84 | HR 66 | Ht 64.0 in | Wt 152.4 lb

## 2018-10-04 DIAGNOSIS — G4719 Other hypersomnia: Secondary | ICD-10-CM

## 2018-10-04 NOTE — Patient Instructions (Signed)

## 2018-10-04 NOTE — Progress Notes (Signed)
[name] Consultation      Assessment and Plan:  Excessive daytime sleepiness. - Symptoms and signs of obstructive sleep apnea. - We will send for sleep study.  Atrial fibrillation, essential hypertension. - Above conditions can be contributed to by obstructive sleep apnea, therefore treatment of sleep apnea is important part of their management.  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   Date: 10/04/2018  MRN# [id number] Leah Huber 10/18/1940  Referring Physician: Dr. Nicki Reaper.  Leah Huber is a 78 y.o. old female seen in consultation for chief complaint of:    Chief Complaint  Patient presents with  . Consult    Consult for sleep apnea. States she had a sleep study over 14 years ago. Recently dx with A-fib. States she has been told she snores.     HPI:   The patient is a 78 year old female referred for excessive snoring and daytime sleepiness.  She usually goes to bed at around 9 PM, wakes up between 7 and 7:30 AM.  Epworth score is 3 today. She had a sleep study overnight more than 10 yrs ago. She was given a CPAP, she used it for a few months, but she could not tolerate the mask which covered her nose and mouth.  She was recently diagnosed with Afib.   She is sleepy during the day, she snores at night. Denies sleep paralysis, no sleep walking, no cataplexy. 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 No dentures.     PMHX:   Past Medical History:  Diagnosis Date  . Anemia   . Aortic aneurysm (HCC)    "SMALL"  . Arthritis    neck to tailbone  . Bilateral carpal tunnel syndrome 12/08/2017  . Bronchitis   . Chronic cough    @ NIGHT/ SEASONAL ALLERGIES  . Clostridium difficile infection 2013-2014  . Colon polyp   . Coronary artery disease    Dr Nehemiah Massed cardiologist  . Depression   . Diverticulosis   . GERD (gastroesophageal reflux disease)   .  Glaucoma   . Hypercholesterolemia   . Hypertension    CONTROLLED ON MEDS  . Hypovolemia 12/10   acute renal failure  . IBS (irritable bowel syndrome)   . Neuromuscular disorder ([place])    occasionally tingling in hands/ NUMBNESS IN RIGHT FOOT s/p back surgery  . Osteoarthritis    bilateral knees  . Osteopenia   . Pneumonia    HX OF  . Sleep apnea    very mild   sleep study 8 yrs ago does not use CPAP  . Urinary incontinence    Surgical Hx:  Past Surgical History:  Procedure Laterality Date  . ABDOMINAL HYSTERECTOMY  1979  . ANTERIOR CERVICAL DECOMP/DISCECTOMY FUSION N/A 11/28/2014   Procedure: CERVICAL FIVE-SIX  ANTERIOR CERVICAL DECOMPRESSION/DISCECTOMY FUSION 1 LEVEL;  Surgeon: Newman Pies, MD;  Location: [facility];  Service: Neurosurgery;  Laterality: N/A;  C56 anterior cervical decompression with fusion interbody prosthesis plating and bonegraft  . BACK SURGERY     X2/ L4,5 & S1/ Cervical C7?  . BLADDER SURGERY  1981   SUSPENSION  . BREAST REDUCTION SURGERY    . CARPAL TUNNEL RELEASE  2007  . CATARACT EXTRACTION W/PHACO Right 12/30/2016   Procedure: CATARACT EXTRACTION PHACO AND INTRAOCULAR LENS PLACEMENT ([facility])  Right toric lens;  Surgeon: Leandrew Koyanagi, MD;  Location: [facility];  Service: Ophthalmology;  Laterality: Right;  sleep apnea, does not use CPAP  . CATARACT EXTRACTION W/PHACO Left 01/20/2017   Procedure: CATARACT EXTRACTION PHACO AND INTRAOCULAR LENS PLACEMENT ([place])  Left Toric;  Surgeon: Leandrew Koyanagi, MD;  Location: [facility];  Service: Ophthalmology;  Laterality: Left;  toric lens  . CHOLECYSTECTOMY  1996  . COLONOSCOPY    . FOOT SURGERY  2005  . [place]  . REDUCTION MAMMAPLASTY Bilateral 2001  . [place] & 2004   left and right  . SPINAL CORD STIMULATOR IMPLANT  03/1997  . SPINAL CORD STIMULATOR REMOVAL  2007  . Tendon repair right shoulder    . TOTAL KNEE ARTHROPLASTY  10/2011 & 09/13    left and right   Family Hx:  Family History  Problem Relation Age of Onset  . Heart disease Mother   . Stroke Mother   . Hypertension Mother   . Hyperlipidemia Father   . Heart disease Father   . Heart disease Brother        2 brothers  . Alzheimer's disease Brother   . Heart disease Sister        pacemaker  . Breast cancer Daughter 76   Social Hx:   Social History   Tobacco Use  . Smoking status: Former Smoker    Packs/day: 1.00    Years: 20.00    Pack years: 20.00    Types: Cigarettes    Last attempt to quit: 09/07/1988    Years since quitting: 30.0  . Smokeless tobacco: Never Used  Substance Use Topics  . Alcohol use: Yes    Alcohol/week: 2.0 standard drinks    Types: 2 Glasses of wine per week    Comment: occasional  . Drug use: No   Medication:    Current Outpatient Medications:  .  apixaban (ELIQUIS) 5 MG TABS tablet, Take 5 mg by mouth 2 (two) times daily., Disp: , Rfl:  .  azelastine (ASTELIN) 0.1 % nasal spray, Place 1 spray into both nostrils 2 (two) times daily. Use in each nostril as directed, Disp: 90 mL, Rfl: 0 .  DETROL LA 4 MG 24 hr capsule, TAKE 1 CAPSULE DAILY, Disp: 90 capsule, Rfl: 4 .  latanoprost (XALATAN) 0.005 % ophthalmic solution, Place 1 drop into both eyes at bedtime. , Disp: , Rfl:  .  losartan (COZAAR) 50 MG tablet, TAKE 1 TABLET DAILY, Disp: 90 tablet, Rfl: 4 .  metoprolol succinate (TOPROL-XL) 50 MG 24 hr tablet, Take 50 mg by mouth daily. Take with or immediately following a meal., Disp: , Rfl:  .  pantoprazole (PROTONIX) 40 MG tablet, TAKE 1 TABLET TWICE A DAY, Disp: 180 tablet, Rfl: 4 .  rosuvastatin (CRESTOR) 20 MG tablet, TAKE 1 TABLET DAILY, Disp: 90 tablet, Rfl: 4   Allergies:  Clindamycin/lincomycin; Bactrim [sulfamethoxazole-trimethoprim]; Morphine; and Sulfa antibiotics  Review of Systems: Gen:  Denies  fever, sweats, chills HEENT: Denies blurred vision, double vision. bleeds, sore throat Cvc:  No dizziness, chest  pain. Resp:   Denies cough or sputum production, shortness of breath Gi: Denies swallowing difficulty, stomach pain. Gu:  Denies bladder incontinence, burning urine Ext:   No Joint pain, stiffness. Skin: No skin rash,  hives  Endoc:  No polyuria, polydipsia. Psych: No depression, insomnia. Other:  All other systems were reviewed with the patient and were negative other that what is mentioned in the HPI.   Physical Examination:   VS: BP 128/84  Pulse 66   Ht 5\' 4"  (1.626 m)   Wt 152 lb 6.4 oz (69.1 kg)   LMP 08/15/1978   SpO2 98%   BMI 26.16 kg/m   General Appearance: No distress  Neuro:without focal findings,  speech normal,  HEENT: PERRLA, EOM intact.  Mallampati 3.   Pulmonary: normal breath sounds, No wheezing.  CardiovascularNormal S1,S2.  No m/r/g.   Abdomen: Benign, Soft, non-tender. Renal:  No costovertebral tenderness  GU:  No performed at this time. Endoc: No evident thyromegaly, no signs of acromegaly. Skin:   warm, no rashes, no ecchymosis  Extremities: normal, no cyanosis, clubbing.  Other findings:    LABORATORY PANEL:   CBC No results for input(s): WBC, HGB, HCT, PLT in the last 168 hours. ------------------------------------------------------------------------------------------------------------------  Chemistries  No results for input(s): NA, K, CL, CO2, GLUCOSE, BUN, CREATININE, CALCIUM, MG, AST, ALT, ALKPHOS, BILITOT in the last 168 hours.  Invalid input(s): GFRCGP ------------------------------------------------------------------------------------------------------------------  Cardiac Enzymes No results for input(s): TROPONINI in the last 168 hours. ------------------------------------------------------------  RADIOLOGY:  No results found.     Thank  you for the consultation and for allowing [facility] to assist in the care of your patient. Our recommendations are noted above.  Please contact us if we can be of  further service.   Marda Stalker, M.D., F.C.C.P.  Board Certified in Internal Medicine, Pulmonary Medicine, [place], and Sleep Medicine.  [facility] Office Number: [phone]   10/04/2018

## 2018-10-10 ENCOUNTER — Ambulatory Visit (INDEPENDENT_AMBULATORY_CARE_PROVIDER_SITE_OTHER): Payer: Medicare Other

## 2018-10-10 ENCOUNTER — Encounter: Payer: Self-pay | Admitting: Internal Medicine

## 2018-10-10 ENCOUNTER — Other Ambulatory Visit (INDEPENDENT_AMBULATORY_CARE_PROVIDER_SITE_OTHER): Payer: Medicare Other

## 2018-10-10 DIAGNOSIS — R944 Abnormal results of kidney function studies: Secondary | ICD-10-CM | POA: Diagnosis not present

## 2018-10-10 DIAGNOSIS — E538 Deficiency of other specified B group vitamins: Secondary | ICD-10-CM

## 2018-10-10 LAB — BASIC METABOLIC PANEL
BUN: 26 mg/dL — ABNORMAL HIGH (ref 6–23)
CHLORIDE: 106 meq/L (ref 96–112)
CO2: 25 mEq/L (ref 19–32)
Calcium: 9.5 mg/dL (ref 8.4–10.5)
Creatinine, Ser: 0.99 mg/dL (ref 0.40–1.20)
GFR: 54.13 mL/min — ABNORMAL LOW (ref >60.00)
Glucose, Bld: 107 mg/dL — ABNORMAL HIGH (ref 70–99)
Potassium: 4.4 mEq/L (ref 3.5–5.1)
Sodium: 139 mEq/L (ref 135–145)

## 2018-10-10 MED ORDER — CYANOCOBALAMIN 1000 MCG/ML IJ SOLN
1000.0000 ug | Freq: Once | INTRAMUSCULAR | Status: AC
  Administered 2018-10-10: 1000 ug via INTRAMUSCULAR

## 2018-10-10 NOTE — Progress Notes (Addendum)
Pt given 1st weekly b12 injection. Given in RD. Tolerated well, no complaint or concerns at this time  Reviewed.  Dr Nicki Reaper

## 2018-10-12 DIAGNOSIS — G4733 Obstructive sleep apnea (adult) (pediatric): Secondary | ICD-10-CM

## 2018-10-13 DIAGNOSIS — I480 Paroxysmal atrial fibrillation: Secondary | ICD-10-CM | POA: Diagnosis not present

## 2018-10-14 DIAGNOSIS — G4733 Obstructive sleep apnea (adult) (pediatric): Secondary | ICD-10-CM

## 2018-10-17 ENCOUNTER — Telehealth: Payer: Self-pay

## 2018-10-17 DIAGNOSIS — G4719 Other hypersomnia: Secondary | ICD-10-CM

## 2018-10-17 DIAGNOSIS — G4733 Obstructive sleep apnea (adult) (pediatric): Secondary | ICD-10-CM

## 2018-10-17 NOTE — Telephone Encounter (Signed)
Spoke to patient sleep study results.  Moderate OSA with AHI of 25. Severe in the supine position.  Recommend auto-CPAP with pressure range of 5-20 cm H2O.   Orders placed.

## 2018-10-18 ENCOUNTER — Ambulatory Visit (INDEPENDENT_AMBULATORY_CARE_PROVIDER_SITE_OTHER): Payer: Medicare Other

## 2018-10-18 DIAGNOSIS — E538 Deficiency of other specified B group vitamins: Secondary | ICD-10-CM | POA: Diagnosis not present

## 2018-10-18 MED ORDER — CYANOCOBALAMIN 1000 MCG/ML IJ SOLN
1000.0000 ug | Freq: Once | INTRAMUSCULAR | Status: AC
  Administered 2018-10-18: 1000 ug via INTRAMUSCULAR

## 2018-10-18 NOTE — Progress Notes (Addendum)
Patient came in today for a B12 injection given in LD.  Patient had no distress during or after the injection.    Reviewed.   Dr Nicki Reaper

## 2018-10-27 ENCOUNTER — Ambulatory Visit (INDEPENDENT_AMBULATORY_CARE_PROVIDER_SITE_OTHER): Payer: Medicare Other | Admitting: Internal Medicine

## 2018-10-27 ENCOUNTER — Ambulatory Visit (INDEPENDENT_AMBULATORY_CARE_PROVIDER_SITE_OTHER): Payer: Medicare Other

## 2018-10-27 ENCOUNTER — Ambulatory Visit: Payer: Medicare Other

## 2018-10-27 ENCOUNTER — Encounter: Payer: Self-pay | Admitting: Internal Medicine

## 2018-10-27 VITALS — BP 132/70 | HR 64 | Temp 97.6°F | Resp 16 | Ht 64.0 in | Wt 152.8 lb

## 2018-10-27 DIAGNOSIS — M25512 Pain in left shoulder: Secondary | ICD-10-CM | POA: Diagnosis not present

## 2018-10-27 DIAGNOSIS — Z1231 Encounter for screening mammogram for malignant neoplasm of breast: Secondary | ICD-10-CM

## 2018-10-27 DIAGNOSIS — M542 Cervicalgia: Secondary | ICD-10-CM

## 2018-10-27 DIAGNOSIS — K219 Gastro-esophageal reflux disease without esophagitis: Secondary | ICD-10-CM

## 2018-10-27 DIAGNOSIS — I4891 Unspecified atrial fibrillation: Secondary | ICD-10-CM | POA: Diagnosis not present

## 2018-10-27 DIAGNOSIS — E538 Deficiency of other specified B group vitamins: Secondary | ICD-10-CM

## 2018-10-27 DIAGNOSIS — Z0000 Encounter for general adult medical examination without abnormal findings: Secondary | ICD-10-CM

## 2018-10-27 DIAGNOSIS — I10 Essential (primary) hypertension: Secondary | ICD-10-CM | POA: Diagnosis not present

## 2018-10-27 DIAGNOSIS — I712 Thoracic aortic aneurysm, without rupture, unspecified: Secondary | ICD-10-CM

## 2018-10-27 DIAGNOSIS — G4730 Sleep apnea, unspecified: Secondary | ICD-10-CM

## 2018-10-27 DIAGNOSIS — D649 Anemia, unspecified: Secondary | ICD-10-CM

## 2018-10-27 DIAGNOSIS — M19012 Primary osteoarthritis, left shoulder: Secondary | ICD-10-CM | POA: Diagnosis not present

## 2018-10-27 DIAGNOSIS — E7800 Pure hypercholesterolemia, unspecified: Secondary | ICD-10-CM

## 2018-10-27 DIAGNOSIS — M47812 Spondylosis without myelopathy or radiculopathy, cervical region: Secondary | ICD-10-CM | POA: Diagnosis not present

## 2018-10-27 MED ORDER — CYANOCOBALAMIN 1000 MCG/ML IJ SOLN
1000.0000 ug | Freq: Once | INTRAMUSCULAR | Status: AC
  Administered 2018-10-27: 1000 ug via INTRAMUSCULAR

## 2018-10-27 NOTE — Assessment & Plan Note (Addendum)
Physical today 10/27/18.  Mammogram 12/31/17 - Birads I.  Schedule f/u mammogram.  Colonoscopy 01/2014.  Recommended f/u colonoscopy in 01/2019.

## 2018-10-27 NOTE — Progress Notes (Signed)
Patient ID: SKYLAH DELAUTER, female   DOB: 30-Apr-1941, 78 y.o.   MRN: [id number]   Subjective:    Patient ID: [name], female    DOB: [birth year], 78 y.o.   MRN: [id number]  HPI  Patient with past history of hypercholesterolemia, hypertension and new diagnosis of sleep apnea and afib.  She comes in today to follow up on these issues as well as for a complete physical exam.  She reports she is doing relatively well.  Trying to get adjusted to sleeping with nasal canula.  Wearing a chin strap.  No chest pain.  Seeing cardiology.  No increased heart rate or palpitations.  No sob.  No acid reflux.  No abdominal pain.  Bowels moving.  Does report increased pain in her left shoulder and shoulder blade.  Increased pain with raising her arm, etc.  No known injury or trauma.     Past Medical History:  Diagnosis Date  . Anemia   . Aortic aneurysm (HCC)    "SMALL"  . Arthritis    neck to tailbone  . Bilateral carpal tunnel syndrome 12/08/2017  . Bronchitis   . Chronic cough    @ NIGHT/ SEASONAL ALLERGIES  . Clostridium difficile infection 2013-2014  . Colon polyp   . Coronary artery disease    Dr Nehemiah Massed cardiologist  . Depression   . Diverticulosis   . GERD (gastroesophageal reflux disease)   . Glaucoma   . Hypercholesterolemia   . Hypertension    CONTROLLED ON MEDS  . Hypovolemia 12/10   acute renal failure  . IBS (irritable bowel syndrome)   . Neuromuscular disorder ([place])    occasionally tingling in hands/ NUMBNESS IN RIGHT FOOT s/p back surgery  . Osteoarthritis    bilateral knees  . Osteopenia   . Pneumonia    HX OF  . Sleep apnea    very mild   sleep study 8 yrs ago does not use CPAP  . Urinary incontinence    Past Surgical History:  Procedure Laterality Date  . ABDOMINAL HYSTERECTOMY  1979  . ANTERIOR CERVICAL DECOMP/DISCECTOMY FUSION N/A 11/28/2014   Procedure: CERVICAL FIVE-SIX  ANTERIOR CERVICAL DECOMPRESSION/DISCECTOMY FUSION 1 LEVEL;  Surgeon: Newman Pies,  MD;  Location: [facility];  Service: Neurosurgery;  Laterality: N/A;  C56 anterior cervical decompression with fusion interbody prosthesis plating and bonegraft  . BACK SURGERY     X2/ L4,5 & S1/ Cervical C7?  . BLADDER SURGERY  1981   SUSPENSION  . BREAST REDUCTION SURGERY    . CARPAL TUNNEL RELEASE  2007  . CATARACT EXTRACTION W/PHACO Right 12/30/2016   Procedure: CATARACT EXTRACTION PHACO AND INTRAOCULAR LENS PLACEMENT ([facility])  Right toric lens;  Surgeon: Leandrew Koyanagi, MD;  Location: [facility];  Service: Ophthalmology;  Laterality: Right;  sleep apnea, does not use CPAP  . CATARACT EXTRACTION W/PHACO Left 01/20/2017   Procedure: CATARACT EXTRACTION PHACO AND INTRAOCULAR LENS PLACEMENT ([place])  Left Toric;  Surgeon: Leandrew Koyanagi, MD;  Location: [facility];  Service: Ophthalmology;  Laterality: Left;  toric lens  . CHOLECYSTECTOMY  1996  . COLONOSCOPY    . FOOT SURGERY  2005  . [place]  . REDUCTION MAMMAPLASTY Bilateral 2001  . [place] & 2004   left and right  . SPINAL CORD STIMULATOR IMPLANT  03/1997  . SPINAL CORD STIMULATOR REMOVAL  2007  . Tendon repair right shoulder    . TOTAL KNEE ARTHROPLASTY  10/2011 & 09/13   left and right   Family History  Problem Relation Age of Onset  . Heart disease Mother   . Stroke Mother   . Hypertension Mother   . Hyperlipidemia Father   . Heart disease Father   . Heart disease Brother        2 brothers  . Alzheimer's disease Brother   . Heart disease Sister        pacemaker  . Breast cancer Daughter 48   Social History   Socioeconomic History  . Marital status: Widowed    Spouse name: Not on file  . Number of children: 2  . Years of education: Not on file  . Highest education level: Not on file  Occupational History  . Not on file  Social Needs  . Financial resource strain: Not on file  . Food insecurity:    Worry: Not on file    Inability: Not on file  .  Transportation needs:    Medical: Not on file    Non-medical: Not on file  Tobacco Use  . Smoking status: Former Smoker    Packs/day: 1.00    Years: 20.00    Pack years: 20.00    Types: Cigarettes    Last attempt to quit: 09/07/1988    Years since quitting: 30.1  . Smokeless tobacco: Never Used  Substance and Sexual Activity  . Alcohol use: Yes    Alcohol/week: 2.0 standard drinks    Types: 2 Glasses of wine per week    Comment: occasional  . Drug use: No  . Sexual activity: Never  Lifestyle  . Physical activity:    Days per week: Not on file    Minutes per session: Not on file  . Stress: Not on file  Relationships  . Social connections:    Talks on phone: Not on file    Gets together: Not on file    Attends religious service: Not on file    Active member of club or organization: Not on file    Attends meetings of clubs or organizations: Not on file    Relationship status: Not on file  Other Topics Concern  . Not on file  Social History Narrative   She is widowed, has two daughters.    Outpatient Encounter Medications as of 10/27/2018  Medication Sig  . apixaban (ELIQUIS) 5 MG TABS tablet Take 5 mg by mouth 2 (two) times daily.  Marland Kitchen DETROL LA 4 MG 24 hr capsule TAKE 1 CAPSULE DAILY  . latanoprost (XALATAN) 0.005 % ophthalmic solution Place 1 drop into both eyes at bedtime.   Marland Kitchen losartan (COZAAR) 50 MG tablet TAKE 1 TABLET DAILY  . metoprolol succinate (TOPROL-XL) 50 MG 24 hr tablet Take 50 mg by mouth daily. Take with or immediately following a meal.  . pantoprazole (PROTONIX) 40 MG tablet TAKE 1 TABLET TWICE A DAY  . rosuvastatin (CRESTOR) 20 MG tablet TAKE 1 TABLET DAILY  . [DISCONTINUED] azelastine (ASTELIN) 0.1 % nasal spray Place 1 spray into both nostrils 2 (two) times daily. Use in each nostril as directed  . [EXPIRED] cyanocobalamin ((VITAMIN B-12)) injection 1,000 mcg    No facility-administered encounter medications on file as of 10/27/2018.     Review of  Systems  Constitutional: Negative for appetite change and unexpected weight change.  HENT: Negative for congestion and sinus pressure.   Eyes: Negative for pain and visual disturbance.  Respiratory: Negative for cough, chest tightness and shortness of breath.  Cardiovascular: Negative for chest pain, palpitations and leg swelling.  Gastrointestinal: Negative for abdominal pain, diarrhea, nausea and vomiting.  Genitourinary: Negative for difficulty urinating and dysuria.  Musculoskeletal: Negative for joint swelling and myalgias.       Increased left shoulder pain as outlined.    Skin: Negative for color change and rash.  Neurological: Negative for dizziness, light-headedness and headaches.  Hematological: Negative for adenopathy. Does not bruise/bleed easily.  Psychiatric/Behavioral: Negative for agitation and dysphoric mood.       Objective:    Physical Exam Constitutional:      General: She is not in acute distress.    Appearance: Normal appearance. She is well-developed.  HENT:     Nose: Nose normal. No congestion.     Mouth/Throat:     Pharynx: No oropharyngeal exudate or posterior oropharyngeal erythema.  Eyes:     General: No scleral icterus.       Right eye: No discharge.        Left eye: No discharge.  Neck:     Musculoskeletal: Neck supple. No muscular tenderness.     Thyroid: No thyromegaly.  Cardiovascular:     Rate and Rhythm: Normal rate and regular rhythm.  Pulmonary:     Effort: No tachypnea, accessory muscle usage or respiratory distress.     Breath sounds: Normal breath sounds. No decreased breath sounds or wheezing.  Chest:     Breasts:        Right: No inverted nipple, mass, nipple discharge or tenderness (no axillary adenopathy).        Left: No inverted nipple, mass, nipple discharge or tenderness (no axilarry adenopathy).  Abdominal:     General: Bowel sounds are normal.     Palpations: Abdomen is soft.     Tenderness: There is no abdominal  tenderness.  Musculoskeletal:        General: No swelling or tenderness.     Comments: Increased pain with lifting left arm - especially above 90 degrees.  Some increased pain with palpation over the left shoulder and above scapula.    Lymphadenopathy:     Cervical: No cervical adenopathy.  Skin:    Findings: No erythema or rash.  Neurological:     Mental Status: She is alert and oriented to person, place, and time.  Psychiatric:        Mood and Affect: Mood normal.        Behavior: Behavior normal.     BP 132/70   Pulse 64   Temp 97.6 F (36.4 C) (Oral)   Resp 16   Ht 5\' 4"  (1.626 m)   Wt 152 lb 12.8 oz (69.3 kg)   LMP 08/15/1978   SpO2 99%   BMI 26.23 kg/m  Wt Readings from Last 3 Encounters:  10/27/18 152 lb 12.8 oz (69.3 kg)  10/04/18 152 lb 6.4 oz (69.1 kg)  07/21/18 154 lb 6.4 oz (70 kg)     Lab Results  Component Value Date   WBC 8.4 09/23/2018   HGB 11.9 (L) 09/23/2018   HCT 35.4 (L) 09/23/2018   PLT 302.0 09/23/2018   GLUCOSE 107 (H) 10/10/2018   CHOL 194 09/23/2018   TRIG 116.0 09/23/2018   HDL 104.40 09/23/2018   LDLDIRECT 147.1 05/30/2013   LDLCALC 67 09/23/2018   ALT 16 09/23/2018   AST 17 09/23/2018   NA 139 10/10/2018   K 4.4 10/10/2018   CL 106 10/10/2018   CREATININE 0.99 10/10/2018   BUN 26 (H)  10/10/2018   CO2 25 10/10/2018   TSH 3.00 07/19/2018   INR 1.0 10/16/2013   HGBA1C 5.6 09/23/2018    Ct Angio Chest Aorta W/cm &/or Wo/cm  Result Date: 04/04/2018 CLINICAL DATA:  Cardiac palpitations. EXAM: CT ANGIOGRAPHY CHEST WITH CONTRAST TECHNIQUE: Multidetector CT imaging of the chest was performed using the standard protocol during bolus administration of intravenous contrast. Multiplanar CT image reconstructions and MIPs were obtained to evaluate the vascular anatomy. CONTRAST:  58mL ISOVUE-370 IOPAMIDOL (ISOVUE-370) INJECTION 76% COMPARISON:  04/16/2014 FINDINGS: Cardiovascular: Maximal diameter of the ascending aorta is 4.0 cm which is  stable compared to the prior study. There is no evidence of dissection. There is no obvious acute intramural hematoma. Atherosclerotic calcifications affect the thoracic aorta and great vessels. The great vessels are patent. Left vertebral artery is patent. There are atherosclerotic calcifications at the origin of the right vertebral artery but there is no narrowing. There is no obvious acute pulmonary thromboembolism. Mild 3 vessel coronary artery calcification. There is some soft smooth plaque along the descending thoracic aorta. Mediastinum/Nodes: No abnormal mediastinal adenopathy or mass effect. Subcentimeter short axis diameter lymph nodes are noted. Esophagus is grossly within normal limits. Thyroid is unremarkable. No pericardial effusion. Lungs/Pleura: No pneumothorax or pleural effusion. Pleuroparenchymal scarring at the lung apices. Mild subsegmental atelectasis towards the lung bases. Stable left lower lobe 3 mm nodule on image 80 of series 5. No new nodule. Upper Abdomen: No acute abnormality. Musculoskeletal: No vertebral compression deformity. C6-7 cervical fusion is noted. Review of the MIP images confirms the above findings. 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 Recommend annual imaging followup by CTA or MRA. This recommendation follows 2010 ACCF/AHA/AATS/ACR/ASA/SCA/SCAI/SIR/STS/SVM Guidelines for the Diagnosis and Management of Patients with Thoracic Aortic Disease. Circulation. 2010; 121: K539-J673 Stable 3 mm pulmonary nodule compared with 2015 supporting benign etiology. Aortic Atherosclerosis (ICD10-I70.0). Electronically Signed   By: Marybelle Killings M.D.   On: 04/04/2018 09:04       Assessment & Plan:   Problem List Items Addressed This Visit    A-fib [facility])    Recently diagnosed.  On eliquis.  Followed by cardiology.  Appears to be in SR now.        Anemia    Follow cbc.        Relevant Medications   cyanocobalamin ((VITAMIN B-12)) injection  1,000 mcg (Completed)   Aortic aneurysm (HCC)    Recently evaluated.  Stable.  Recommended f/u in one year.        GERD (gastroesophageal reflux disease)    Controlled on current regimen.        Health care maintenance    Physical today 10/27/18.  Mammogram 12/31/17 - [facility] I.  Schedule f/u mammogram.  Colonoscopy 01/2014.  Recommended f/u colonoscopy in 01/2019.        Hypercholesterolemia    On crestor.  Low cholesterol diet and exercise.  Follow lipid panel and liver function tests.        Hypertension    Blood pressure under good control.  Continue same medication regimen.  Follow pressures.  Follow metabolic panel.        Left shoulder pain    Check xray.  May need ortho evaluation.        Relevant Orders   DG Shoulder Left (Completed)   Neck pain   Relevant Orders   DG Cervical Spine 2 or 3 views (Completed)   Sleep apnea    Using nasal canula.  Trying to get adjusted.  Follow.         Other Visit Diagnoses    Visit for screening mammogram    -  Primary   Relevant Orders   MM 3D SCREEN BREAST BILATERAL   B12 deficiency       Relevant Medications   cyanocobalamin ((VITAMIN B-12)) injection 1,000 mcg (Completed)       Einar Pheasant, MD

## 2018-10-29 ENCOUNTER — Other Ambulatory Visit: Payer: Self-pay | Admitting: Internal Medicine

## 2018-10-29 ENCOUNTER — Encounter: Payer: Self-pay | Admitting: Internal Medicine

## 2018-10-29 DIAGNOSIS — M25512 Pain in left shoulder: Secondary | ICD-10-CM | POA: Insufficient documentation

## 2018-10-29 DIAGNOSIS — I4891 Unspecified atrial fibrillation: Secondary | ICD-10-CM | POA: Insufficient documentation

## 2018-10-29 DIAGNOSIS — G4730 Sleep apnea, unspecified: Secondary | ICD-10-CM | POA: Insufficient documentation

## 2018-10-29 NOTE — Assessment & Plan Note (Signed)
Follow cbc.  

## 2018-10-29 NOTE — Assessment & Plan Note (Signed)
On crestor.  Low cholesterol diet and exercise.  Follow lipid panel and liver function tests.   

## 2018-10-29 NOTE — Progress Notes (Signed)
Order placed for ortho referral.   

## 2018-10-29 NOTE — Assessment & Plan Note (Signed)
Using nasal canula.  Trying to get adjusted.  Follow.

## 2018-10-29 NOTE — Assessment & Plan Note (Signed)
Blood pressure under good control.  Continue same medication regimen.  Follow pressures.  Follow metabolic panel.   

## 2018-10-29 NOTE — Assessment & Plan Note (Signed)
Recently evaluated.  Stable.  Recommended f/u in one year.

## 2018-10-29 NOTE — Assessment & Plan Note (Signed)
Recently diagnosed.  On eliquis.  Followed by cardiology.  Appears to be in SR now.

## 2018-10-29 NOTE — Assessment & Plan Note (Signed)
Controlled on current regimen.   

## 2018-10-29 NOTE — Assessment & Plan Note (Signed)
Check xray.  May need ortho evaluation.

## 2018-11-01 DIAGNOSIS — I2510 Atherosclerotic heart disease of native coronary artery without angina pectoris: Secondary | ICD-10-CM | POA: Diagnosis not present

## 2018-11-01 DIAGNOSIS — I712 Thoracic aortic aneurysm, without rupture: Secondary | ICD-10-CM | POA: Diagnosis not present

## 2018-11-01 DIAGNOSIS — I10 Essential (primary) hypertension: Secondary | ICD-10-CM | POA: Diagnosis not present

## 2018-11-01 DIAGNOSIS — I6523 Occlusion and stenosis of bilateral carotid arteries: Secondary | ICD-10-CM | POA: Diagnosis not present

## 2018-11-01 DIAGNOSIS — I480 Paroxysmal atrial fibrillation: Secondary | ICD-10-CM | POA: Diagnosis not present

## 2018-11-03 ENCOUNTER — Ambulatory Visit (INDEPENDENT_AMBULATORY_CARE_PROVIDER_SITE_OTHER): Payer: Medicare Other | Admitting: *Deleted

## 2018-11-03 DIAGNOSIS — E538 Deficiency of other specified B group vitamins: Secondary | ICD-10-CM | POA: Diagnosis not present

## 2018-11-03 MED ORDER — CYANOCOBALAMIN 1000 MCG/ML IJ SOLN
1000.0000 ug | Freq: Once | INTRAMUSCULAR | Status: AC
  Administered 2018-11-03: 1000 ug via INTRAMUSCULAR

## 2018-11-15 NOTE — Progress Notes (Addendum)
Patient presented for B 12 injection to left deltoid, patient voiced no concerns nor showed any signs of distress during injection.  Reviewed.  Dr Scott 

## 2018-11-16 ENCOUNTER — Other Ambulatory Visit: Payer: Self-pay

## 2018-11-16 ENCOUNTER — Ambulatory Visit (INDEPENDENT_AMBULATORY_CARE_PROVIDER_SITE_OTHER): Payer: Medicare Other | Admitting: Family

## 2018-11-16 ENCOUNTER — Encounter: Payer: Self-pay | Admitting: Family

## 2018-11-16 VITALS — BP 128/68 | HR 70 | Temp 97.5°F | Wt 156.0 lb

## 2018-11-16 DIAGNOSIS — H65111 Acute and subacute allergic otitis media (mucoid) (sanguinous) (serous), right ear: Secondary | ICD-10-CM

## 2018-11-16 MED ORDER — BENZONATATE 100 MG PO CAPS: 100.0000 mg | ORAL_CAPSULE | Freq: Two times a day (BID) | ORAL | 0 refills | Status: DC | PRN

## 2018-11-16 NOTE — Progress Notes (Signed)
Subjective:    Patient ID: Leah Huber, female    DOB: July 28, 1941, 78 y.o.   MRN: [id number]  CC: Leah Huber is a 78 y.o. female who presents today for an acute visit.    HPI: CC: thick congestion x one week, unchanged Left ear popping for 2 days, unchanged.  Coughing, HA 'sinus headache', sinus pressure.  No fever, sob, wheezing, discharge from ear  Has been using saline,azelestine.   H/o atrial fibrillation.   H/o cdiff ( 3x).   No cp, sob.   No recent travel.        HISTORY:  Past Medical History:  Diagnosis Date  . Anemia   . Aortic aneurysm (HCC)    "SMALL"  . Arthritis    neck to tailbone  . Bilateral carpal tunnel syndrome 12/08/2017  . Bronchitis   . Chronic cough    @ NIGHT/ SEASONAL ALLERGIES  . Clostridium difficile infection 2013-2014  . Colon polyp   . Coronary artery disease    Dr Nehemiah Massed cardiologist  . Depression   . Diverticulosis   . GERD (gastroesophageal reflux disease)   . Glaucoma   . Hypercholesterolemia   . Hypertension    CONTROLLED ON MEDS  . Hypovolemia 12/10   acute renal failure  . IBS (irritable bowel syndrome)   . Neuromuscular disorder ([place])    occasionally tingling in hands/ NUMBNESS IN RIGHT FOOT s/p back surgery  . Osteoarthritis    bilateral knees  . Osteopenia   . Pneumonia    HX OF  . Sleep apnea    very mild   sleep study 8 yrs ago does not use CPAP  . Urinary incontinence    Past Surgical History:  Procedure Laterality Date  . ABDOMINAL HYSTERECTOMY  1979  . ANTERIOR CERVICAL DECOMP/DISCECTOMY FUSION N/A 11/28/2014   Procedure: CERVICAL FIVE-SIX  ANTERIOR CERVICAL DECOMPRESSION/DISCECTOMY FUSION 1 LEVEL;  Surgeon: Newman Pies, MD;  Location: [place] [facility];  Service: Neurosurgery;  Laterality: N/A;  C56 anterior cervical decompression with fusion interbody prosthesis plating and bonegraft  . BACK SURGERY     X2/ L4,5 & S1/ Cervical C7?  . BLADDER SURGERY  1981   SUSPENSION  . BREAST REDUCTION  SURGERY    . CARPAL TUNNEL RELEASE  2007  . CATARACT EXTRACTION W/PHACO Right 12/30/2016   Procedure: CATARACT EXTRACTION PHACO AND INTRAOCULAR LENS PLACEMENT ([facility])  Right toric lens;  Surgeon: Leandrew Koyanagi, MD;  Location: [facility];  Service: Ophthalmology;  Laterality: Right;  sleep apnea, does not use CPAP  . CATARACT EXTRACTION W/PHACO Left 01/20/2017   Procedure: CATARACT EXTRACTION PHACO AND INTRAOCULAR LENS PLACEMENT ([place])  Left Toric;  Surgeon: Leandrew Koyanagi, MD;  Location: [facility];  Service: Ophthalmology;  Laterality: Left;  toric lens  . CHOLECYSTECTOMY  1996  . COLONOSCOPY    . FOOT SURGERY  2005  . [place]  . REDUCTION MAMMAPLASTY Bilateral 2001  . [place] & 2004   left and right  . SPINAL CORD STIMULATOR IMPLANT  03/1997  . SPINAL CORD STIMULATOR REMOVAL  2007  . Tendon repair right shoulder    . TOTAL KNEE ARTHROPLASTY  10/2011 & 09/13   left and right   Family History  Problem Relation Age of Onset  . Heart disease Mother   . Stroke Mother   . Hypertension Mother   . Hyperlipidemia Father   . Heart disease Father   . Heart disease Brother  2 brothers  . Alzheimer's disease Brother   . Heart disease Sister        pacemaker  . Breast cancer Daughter 1    Allergies: Clindamycin/lincomycin; Bactrim [sulfamethoxazole-trimethoprim]; Morphine; and Sulfa antibiotics Current Outpatient Medications on File Prior to Visit  Medication Sig Dispense Refill  . apixaban (ELIQUIS) 5 MG TABS tablet Take 5 mg by mouth 2 (two) times daily.    Marland Kitchen DETROL LA 4 MG 24 hr capsule TAKE 1 CAPSULE DAILY 90 capsule 4  . latanoprost (XALATAN) 0.005 % ophthalmic solution Place 1 drop into both eyes at bedtime.     Marland Kitchen losartan (COZAAR) 50 MG tablet TAKE 1 TABLET DAILY 90 tablet 4  . metoprolol succinate (TOPROL-XL) 50 MG 24 hr tablet Take 50 mg by mouth daily. Take with or immediately following a meal.    . pantoprazole  (PROTONIX) 40 MG tablet TAKE 1 TABLET TWICE A DAY 180 tablet 4  . rosuvastatin (CRESTOR) 20 MG tablet TAKE 1 TABLET DAILY 90 tablet 4   No current facility-administered medications on file prior to visit.     Social History   Tobacco Use  . Smoking status: Former Smoker    Packs/day: 1.00    Years: 20.00    Pack years: 20.00    Types: Cigarettes    Last attempt to quit: 09/07/1988    Years since quitting: 30.2  . Smokeless tobacco: Never Used  Substance Use Topics  . Alcohol use: Yes    Alcohol/week: 2.0 standard drinks    Types: 2 Glasses of wine per week    Comment: occasional  . Drug use: No    Review of Systems  Constitutional: Negative for chills and fever.  HENT: Positive for congestion, ear pain and sinus pressure. Negative for ear discharge, sore throat and trouble swallowing.   Respiratory: Positive for cough. Negative for shortness of breath and wheezing.   Cardiovascular: Negative for chest pain and palpitations.  Gastrointestinal: Negative for nausea and vomiting.      Objective:    BP 128/68 (BP Location: Left Arm, Patient Position: Sitting, Cuff Size: Large)   Pulse 70   Temp (!) 97.5 F (36.4 C)   Wt 156 lb (70.8 kg)   LMP 08/15/1978   SpO2 98%   BMI 26.78 kg/m    Physical Exam Vitals signs reviewed.  Constitutional:      Appearance: She is well-developed.  HENT:     Head: Normocephalic and atraumatic.     Right Ear: Hearing, ear canal and external ear normal. No decreased hearing noted. No drainage, swelling or tenderness. No middle ear effusion. No foreign body. Tympanic membrane is erythematous. Tympanic membrane is not bulging.     Left Ear: Hearing, tympanic membrane, ear canal and external ear normal. No decreased hearing noted. No drainage, swelling or tenderness.  No middle ear effusion. No foreign body. Tympanic membrane is not erythematous or bulging.     Nose: Nose normal. No rhinorrhea.     Right Sinus: No maxillary sinus tenderness or  frontal sinus tenderness.     Left Sinus: No maxillary sinus tenderness or frontal sinus tenderness.     Mouth/Throat:     Pharynx: Uvula midline. No oropharyngeal exudate or posterior oropharyngeal erythema.     Tonsils: No tonsillar abscesses.  Eyes:     Conjunctiva/sclera: Conjunctivae normal.  Cardiovascular:     Rate and Rhythm: Regular rhythm.     Pulses: Normal pulses.     Heart sounds: Normal heart  sounds.  Pulmonary:     Effort: Pulmonary effort is normal.     Breath sounds: Normal breath sounds. No wheezing, rhonchi or rales.  Lymphadenopathy:     Head:     Right side of head: No submental, submandibular, tonsillar, preauricular, posterior auricular or occipital adenopathy.     Left side of head: No submental, submandibular, tonsillar, preauricular, posterior auricular or occipital adenopathy.     Cervical: No cervical adenopathy.  Skin:    General: Skin is warm and dry.  Neurological:     Mental Status: She is alert.  Psychiatric:        Speech: Speech normal.        Behavior: Behavior normal.        Thought Content: Thought content normal.        Assessment & Plan:   1. Non-recurrent acute allergic otitis media of right ear Patient well-appearing, nontoxic in appearance.  Erythema noted on right tympanic membrane.  No purulent discharge seen behind TM.  Patient and Huber jointly agreed could be viral versus allergic etiology.  Duration of 2 days.  History of C. difficile, we both expressed her caution with antibiotics.  She will try Mucinex, plenty of water, Tessalon Perles as needed for cough, no improvement she will call the office and let us know.  At that point Huber think it be reasonable to start antibiotic if no improvement with conservative therapy at home  - benzonatate (TESSALON) 100 MG capsule; Take 1 capsule (100 mg total) by mouth 2 (two) times daily as needed for cough.  Dispense: 20 capsule; Refill: 0    Huber am having Leah Huber start on benzonatate. Huber  am also having her maintain her latanoprost, pantoprazole, losartan, rosuvastatin, Detrol LA, apixaban, and metoprolol succinate.   Meds ordered this encounter  Medications  . benzonatate (TESSALON) 100 MG capsule    Sig: Take 1 capsule (100 mg total) by mouth 2 (two) times daily as needed for cough.    Dispense:  20 capsule    Refill:  0    Order Specific Question:   Supervising Provider    Answer:   Crecencio Mc [[id number]]    Return precautions given.   Risks, benefits, and alternatives of the medications and treatment plan prescribed today were discussed, and patient expressed understanding.   Education regarding symptom management and diagnosis given to patient on AVS.  Continue to follow with Einar Pheasant, MD for routine health maintenance.   Leah Huber agreed with plan.   Mable Paris, FNP

## 2018-11-16 NOTE — Patient Instructions (Addendum)
Suspect viral in etiology.   May try Mucinex, plain guaifenesin, to help loosen up secretions  Plenty of water  Please let me know how you are doing.

## 2018-12-06 ENCOUNTER — Ambulatory Visit (INDEPENDENT_AMBULATORY_CARE_PROVIDER_SITE_OTHER): Payer: Medicare Other

## 2018-12-06 ENCOUNTER — Other Ambulatory Visit: Payer: Self-pay

## 2018-12-06 DIAGNOSIS — E538 Deficiency of other specified B group vitamins: Secondary | ICD-10-CM | POA: Diagnosis not present

## 2018-12-06 MED ORDER — CYANOCOBALAMIN 1000 MCG/ML IJ SOLN
1000.0000 ug | Freq: Once | INTRAMUSCULAR | Status: AC
  Administered 2018-12-06: 1000 ug via INTRAMUSCULAR

## 2018-12-06 NOTE — Progress Notes (Addendum)
Leah Huber presents today for injection per MD orders. B12 injection  administered IM in left Upper Arm. Administration without incident. Patient tolerated well.  Leah Huber,  Reviewed.  Dr Nicki Reaper

## 2019-01-02 ENCOUNTER — Telehealth: Payer: Self-pay | Admitting: Internal Medicine

## 2019-01-02 ENCOUNTER — Ambulatory Visit (INDEPENDENT_AMBULATORY_CARE_PROVIDER_SITE_OTHER): Payer: Medicare Other | Admitting: Internal Medicine

## 2019-01-02 DIAGNOSIS — G4733 Obstructive sleep apnea (adult) (pediatric): Secondary | ICD-10-CM

## 2019-01-02 NOTE — Patient Instructions (Signed)
Use cpap every night for the whole night.  Will need to repeat appointment and download in one month to document compliance for your insurance to cover the CPAP.

## 2019-01-02 NOTE — Telephone Encounter (Signed)
Left message to schedule pt for 49mo f/u.

## 2019-01-02 NOTE — Progress Notes (Signed)
[facility] Consultation     Virtual Visit via Telephone Note I connected with pt on 01/02/19 at  2:45 PM EDT by telephone and verified that I am speaking with the correct person using two identifiers.   I discussed the limitations, risks, security and privacy concerns of performing an evaluation and management service by telephone and the availability of in person appointments. I also discussed with the patient that there may be a patient responsible charge related to this service. The patient expressed understanding and agreed to proceed. I discussed the assessment and treatment plan with the patient. The patient was provided an opportunity to ask questions and all were answered. The patient agreed with the plan and demonstrated an understanding of the instructions. Please see note below for further detail.    The patient was advised to call back or seek an in-person evaluation if the symptoms worsen or if the condition fails to improve as anticipated.  I provided 15 minutes of non-face-to-face time during this encounter.   Laverle Hobby, MD   Assessment and Plan:  Moderate obstructive sleep apnea. - Has not been using CPAP because of a sinus infection. However reports no problems in tolerating the CPAP. She is feeling better now and is ready to restart.  --Informed that will need to repeat follow up in 1 month to demonstrate compliance for insurance.   Atrial fibrillation, essential hypertension. - Above conditions can be contributed to by obstructive sleep apnea, therefore treatment of sleep apnea is important part of their management.  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   Date: 01/02/2019  MRN# [id number] Leah Huber [birth year]  Referring Physician: Dr. Nicki Reaper.  Leah Huber is a 78 y.o. old female seen in consultation for chief complaint of: Sleep  apnea.     HPI:   The patient is a 78 year old female found to have moderate OSA. She was started on CPAP but has not been using it because of a bad sinus infection, and has only restarted it. She has not used it because of sinus infection, but is now better and feels that she can now use CPAP again.    She is sleepy during the day, she snores at night. Denies sleep paralysis, no sleep walking, no cataplexy. 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 No dentures.   **CPAP download 10/15/2018-11/13/2018>> uses greater than 4 hours is 0/30 days.  Average usage on days used is 1 hour 26 minutes.  Pressure ranges 5-20.  Median pressure 7, 95th percentile pressure is 8, maximum pressure is 9.  Residual AHI is 2.1.  Overall this shows a poor compliance with good control obstructive sleep apnea. **HST 10/12/2018>>AHI of 25.  Started on CPAP pressure range 5-20.  Medication:    Current Outpatient Medications:  .  apixaban (ELIQUIS) 5 MG TABS tablet, Take 5 mg by mouth 2 (two) times daily., Disp: , Rfl:  .  benzonatate (TESSALON) 100 MG capsule, Take 1 capsule (100 mg total) by mouth 2 (two) times daily as needed for cough., Disp: 20 capsule, Rfl: 0 .  DETROL LA 4 MG 24 hr capsule, TAKE 1 CAPSULE DAILY, Disp: 90 capsule, Rfl: 4 .  latanoprost (XALATAN) 0.005 % ophthalmic solution, Place 1 drop into both eyes at bedtime. , Disp: , Rfl:  .  losartan (COZAAR) 50 MG tablet, TAKE 1 TABLET DAILY, Disp: 90 tablet, Rfl: 4 .  metoprolol succinate (TOPROL-XL) 50 MG 24 hr tablet, Take 50 mg by mouth daily. Take with or immediately following a meal., Disp: , Rfl:  .  pantoprazole (PROTONIX) 40 MG tablet, TAKE 1 TABLET TWICE A DAY, Disp: 180 tablet, Rfl: 4 .  rosuvastatin (CRESTOR) 20 MG tablet, TAKE 1 TABLET DAILY, Disp: 90 tablet, Rfl: 4   Allergies:  Clindamycin/lincomycin; Bactrim [sulfamethoxazole-trimethoprim]; Morphine; and Sulfa antibiotics  Review of Systems:  Constitutional: Feels well. Cardiovascular:  Denies chest pain, exertional chest pain.  Pulmonary: Denies hemoptysis, pleuritic chest pain.   The remainder of systems were reviewed and were found to be negative other than what is documented in the HPI.    Physical Examination:  --    LABORATORY PANEL:   CBC No results for input(s): WBC, HGB, HCT, PLT in the last 168 hours. ------------------------------------------------------------------------------------------------------------------  Chemistries  No results for input(s): NA, K, CL, CO2, GLUCOSE, BUN, CREATININE, CALCIUM, MG, AST, ALT, ALKPHOS, BILITOT in the last 168 hours.  Invalid input(s): GFRCGP ------------------------------------------------------------------------------------------------------------------  Cardiac Enzymes No results for input(s): TROPONINI in the last 168 hours. ------------------------------------------------------------  RADIOLOGY:  No results found.     Thank  you for the consultation and for allowing [facility] to assist in the care of your patient. Our recommendations are noted above.  Please contact us if we can be of further service.   Marda Stalker, M.D., F.C.C.P.  Board Certified in Internal Medicine, Pulmonary Medicine, [place], and Sleep Medicine.  [facility] Office Number: [phone]   01/02/2019

## 2019-01-03 NOTE — Telephone Encounter (Signed)
Pt has been scheduled for 40mo rov on 01/31/2019 at 2:45. Nothing further is needed.

## 2019-01-05 ENCOUNTER — Ambulatory Visit: Payer: Medicare Other

## 2019-01-10 ENCOUNTER — Ambulatory Visit (INDEPENDENT_AMBULATORY_CARE_PROVIDER_SITE_OTHER): Payer: Medicare Other | Admitting: *Deleted

## 2019-01-10 ENCOUNTER — Other Ambulatory Visit: Payer: Self-pay

## 2019-01-10 DIAGNOSIS — E538 Deficiency of other specified B group vitamins: Secondary | ICD-10-CM | POA: Diagnosis not present

## 2019-01-10 MED ORDER — CYANOCOBALAMIN 1000 MCG/ML IJ SOLN
1000.0000 ug | Freq: Once | INTRAMUSCULAR | Status: AC
  Administered 2019-01-10: 1000 ug via INTRAMUSCULAR

## 2019-01-10 NOTE — Progress Notes (Addendum)
cyanPatient presented for B 12 injection to right deltoid, patient voiced no concerns nor showed any signs of distress during injection.  Reviewed.  Dr Nicki Reaper

## 2019-01-20 ENCOUNTER — Ambulatory Visit: Admit: 2019-01-20 | Payer: Medicare Other | Admitting: Unknown Physician Specialty

## 2019-01-20 SURGERY — COLONOSCOPY WITH PROPOFOL
Anesthesia: General

## 2019-01-31 ENCOUNTER — Ambulatory Visit (INDEPENDENT_AMBULATORY_CARE_PROVIDER_SITE_OTHER): Payer: Medicare Other | Admitting: Internal Medicine

## 2019-01-31 DIAGNOSIS — G4733 Obstructive sleep apnea (adult) (pediatric): Secondary | ICD-10-CM

## 2019-01-31 NOTE — Patient Instructions (Signed)
Start using cpap every night.

## 2019-01-31 NOTE — Progress Notes (Signed)
[facility] Consultation      Virtual Visit via Telephone Note I connected with patient on 01/31/19 at  2:45 PM EDT by telephone and verified that I am speaking with the correct person using two identifiers.   I discussed the limitations, risks, security and privacy concerns of performing an evaluation and management service by telephone and the availability of in person appointments. I also discussed with the patient that there may be a patient responsible charge related to this service. The patient expressed understanding and agreed to proceed. I discussed the assessment and treatment plan with the patient. The patient was provided an opportunity to ask questions and all were answered. The patient agreed with the plan and demonstrated an understanding of the instructions. Please see note below for further detail.    The patient was advised to call back or seek an in-person evaluation if the symptoms worsen or if the condition fails to improve as anticipated.  -Laverle Hobby, MD     Assessment and Plan:  Moderate obstructive sleep apnea. - Has been having sinus issues, I was not using CPAP, she is now using azelastine nasal spray and feels that she can start using CPAP normally. --Informed that she may need to repeat follow up in 1 month to demonstrate compliance for insurance.   Atrial fibrillation, essential hypertension. - Above conditions can be contributed to by obstructive sleep apnea, therefore treatment of sleep apnea is important part of their management.  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   Date: 01/31/2019  MRN# [id number] EMELIE NEWSOM 1940/10/12  Referring Physician: Dr. Nicki Reaper.  BILLY ROCCO is a 78 y.o. old female seen in consultation for chief complaint of: Sleep apnea.     HPI:   The patient is a 78 year old female found to have  moderate OSA.  At last visit it was noted that she had recently been started on CPAP, however she had not been using it due to a recent sinus infection.  Therefore she was asked to follow-up once again to meet insurance requirements for coverage. Review of her most recent download, shows that she has not really been using it, of no particular reason. She continues to have sinus issues, she uses azelastine nasal spray which helps.    She is sleepy during the day, she snores at night. Denies sleep paralysis, no sleep walking, no cataplexy. 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 No dentures.   **CPAP download 10/25/2018-11/13/2018>> raw data personally reviewed, average usage on days used is 39 minutes.  Uses greater than 4 hours is 0 days / 20 days.  Set pressure is 5-20.  Median pressure 7, 95 percentile pressure 8, max pressure 9.  Leaks are within normal limits.  Residual AHI is 2.1.  Overall this test shows very poor compliance with CPAP with good control of obstructive sleep apnea. **CPAP download 10/15/2018-11/13/2018>> uses greater than 4 hours is 0/30 days.  Average usage on days used is 1 hour 26 minutes.  Pressure ranges 5-20.  Median pressure 7, 95th percentile pressure is 8, maximum pressure is 9.  Residual AHI is 2.1.  Overall this shows a poor compliance with good control obstructive sleep apnea. **HST 10/12/2018>>AHI of 25.  Started on CPAP pressure range 5-20.  Medication:    Current Outpatient Medications:  .  apixaban (ELIQUIS) 5 MG TABS tablet, Take 5 mg by mouth  2 (two) times daily., Disp: , Rfl:  .  benzonatate (TESSALON) 100 MG capsule, Take 1 capsule (100 mg total) by mouth 2 (two) times daily as needed for cough., Disp: 20 capsule, Rfl: 0 .  DETROL LA 4 MG 24 hr capsule, TAKE 1 CAPSULE DAILY, Disp: 90 capsule, Rfl: 4 .  latanoprost (XALATAN) 0.005 % ophthalmic solution, Place 1 drop into both eyes at bedtime. , Disp: , Rfl:  .  losartan (COZAAR) 50 MG tablet, TAKE 1 TABLET DAILY, Disp:  90 tablet, Rfl: 4 .  metoprolol succinate (TOPROL-XL) 50 MG 24 hr tablet, Take 50 mg by mouth daily. Take with or immediately following a meal., Disp: , Rfl:  .  pantoprazole (PROTONIX) 40 MG tablet, TAKE 1 TABLET TWICE A DAY, Disp: 180 tablet, Rfl: 4 .  rosuvastatin (CRESTOR) 20 MG tablet, TAKE 1 TABLET DAILY, Disp: 90 tablet, Rfl: 4   Allergies:  Clindamycin/lincomycin; Bactrim [sulfamethoxazole-trimethoprim]; Morphine; and Sulfa antibiotics  Review of Systems:  Constitutional: Feels well. Cardiovascular: Denies chest pain, exertional chest pain.  Pulmonary: Denies hemoptysis, pleuritic chest pain.   The remainder of systems were reviewed and were found to be negative other than what is documented in the HPI.       LABORATORY PANEL:   CBC No results for input(s): WBC, HGB, HCT, PLT in the last 168 hours. ------------------------------------------------------------------------------------------------------------------  Chemistries  No results for input(s): NA, K, CL, CO2, GLUCOSE, BUN, CREATININE, CALCIUM, MG, AST, ALT, ALKPHOS, BILITOT in the last 168 hours.  Invalid input(s): GFRCGP ------------------------------------------------------------------------------------------------------------------  Cardiac Enzymes No results for input(s): TROPONINI in the last 168 hours. ------------------------------------------------------------  RADIOLOGY:  No results found.     Thank  you for the consultation and for allowing [facility] to assist in the care of your patient. Our recommendations are noted above.  Please contact us if we can be of further service.   Marda Stalker, M.D., F.C.C.P.  Board Certified in Internal Medicine, Pulmonary Medicine, [place], and Sleep Medicine.  [facility] Office Number: [phone]   01/31/2019

## 2019-02-02 DIAGNOSIS — H401131 Primary open-angle glaucoma, bilateral, mild stage: Secondary | ICD-10-CM | POA: Diagnosis not present

## 2019-02-14 ENCOUNTER — Ambulatory Visit (INDEPENDENT_AMBULATORY_CARE_PROVIDER_SITE_OTHER): Payer: Medicare Other

## 2019-02-14 ENCOUNTER — Other Ambulatory Visit: Payer: Self-pay

## 2019-02-14 DIAGNOSIS — E538 Deficiency of other specified B group vitamins: Secondary | ICD-10-CM | POA: Diagnosis not present

## 2019-02-14 MED ORDER — CYANOCOBALAMIN 1000 MCG/ML IJ SOLN
1000.0000 ug | Freq: Once | INTRAMUSCULAR | Status: AC
  Administered 2019-02-14: 1000 ug via INTRAMUSCULAR

## 2019-02-14 NOTE — Progress Notes (Addendum)
Patient came in today for B-12 injection in right deltoid. Patient tolerated well.   Reviewed.  Dr Nicki Reaper

## 2019-02-24 ENCOUNTER — Telehealth: Payer: Self-pay | Admitting: *Deleted

## 2019-02-24 DIAGNOSIS — E538 Deficiency of other specified B group vitamins: Secondary | ICD-10-CM

## 2019-02-24 DIAGNOSIS — I10 Essential (primary) hypertension: Secondary | ICD-10-CM

## 2019-02-24 DIAGNOSIS — D649 Anemia, unspecified: Secondary | ICD-10-CM

## 2019-02-24 DIAGNOSIS — R739 Hyperglycemia, unspecified: Secondary | ICD-10-CM

## 2019-02-24 DIAGNOSIS — E7800 Pure hypercholesterolemia, unspecified: Secondary | ICD-10-CM

## 2019-02-24 NOTE — Telephone Encounter (Signed)
Please place future orders for lab appt.  

## 2019-02-25 NOTE — Telephone Encounter (Signed)
Order placed for f/u labs.  

## 2019-02-28 ENCOUNTER — Other Ambulatory Visit: Payer: Self-pay

## 2019-02-28 ENCOUNTER — Ambulatory Visit
Admission: RE | Admit: 2019-02-28 | Discharge: 2019-02-28 | Disposition: A | Discharge status: Home / Self Care | Payer: Medicare Other | Source: Ambulatory Visit | Attending: Internal Medicine | Admitting: Internal Medicine

## 2019-02-28 ENCOUNTER — Other Ambulatory Visit: Payer: Self-pay | Admitting: Internal Medicine

## 2019-02-28 ENCOUNTER — Other Ambulatory Visit (INDEPENDENT_AMBULATORY_CARE_PROVIDER_SITE_OTHER): Payer: Medicare Other

## 2019-02-28 DIAGNOSIS — E7800 Pure hypercholesterolemia, unspecified: Secondary | ICD-10-CM | POA: Diagnosis not present

## 2019-02-28 DIAGNOSIS — I10 Essential (primary) hypertension: Secondary | ICD-10-CM

## 2019-02-28 DIAGNOSIS — R944 Abnormal results of kidney function studies: Secondary | ICD-10-CM

## 2019-02-28 DIAGNOSIS — D7589 Other specified diseases of blood and blood-forming organs: Secondary | ICD-10-CM

## 2019-02-28 DIAGNOSIS — R739 Hyperglycemia, unspecified: Secondary | ICD-10-CM | POA: Diagnosis not present

## 2019-02-28 DIAGNOSIS — E538 Deficiency of other specified B group vitamins: Secondary | ICD-10-CM | POA: Diagnosis not present

## 2019-02-28 DIAGNOSIS — D649 Anemia, unspecified: Secondary | ICD-10-CM | POA: Diagnosis not present

## 2019-02-28 DIAGNOSIS — Z1231 Encounter for screening mammogram for malignant neoplasm of breast: Secondary | ICD-10-CM | POA: Diagnosis not present

## 2019-02-28 LAB — LIPID PANEL
Cholesterol: 176 mg/dL (ref 0–200)
HDL: 82.6 mg/dL (ref >39.00)
LDL Cholesterol: 65 mg/dL (ref 0–99)
NonHDL: 93.77
Total CHOL/HDL Ratio: 2
Triglycerides: 142 mg/dL (ref 0.0–149.0)
VLDL: 28.4 mg/dL (ref 0.0–40.0)

## 2019-02-28 LAB — CBC WITH DIFFERENTIAL/PLATELET
Basophils Absolute: 0 10*3/uL (ref 0.0–0.1)
Basophils Relative: 0.7 % (ref 0.0–3.0)
Eosinophils Absolute: 0.1 10*3/uL (ref 0.0–0.7)
Eosinophils Relative: 1.7 % (ref 0.0–5.0)
HCT: 36.5 % (ref 36.0–46.0)
Hemoglobin: 12.1 g/dL (ref 12.0–15.0)
Lymphocytes Relative: 37.4 % (ref 12.0–46.0)
Lymphs Abs: 2.5 10*3/uL (ref 0.7–4.0)
MCHC: 33 g/dL (ref 30.0–36.0)
MCV: 103.1 fl — ABNORMAL HIGH (ref 78.0–100.0)
Monocytes Absolute: 0.5 10*3/uL (ref 0.1–1.0)
Monocytes Relative: 7.2 % (ref 3.0–12.0)
Neutro Abs: 3.6 10*3/uL (ref 1.4–7.7)
Neutrophils Relative %: 53 % (ref 43.0–77.0)
Platelets: 299 10*3/uL (ref 150.0–400.0)
RBC: 3.54 Mil/uL — ABNORMAL LOW (ref 3.87–5.11)
RDW: 13.8 % (ref 11.5–15.5)
WBC: 6.7 10*3/uL (ref 4.0–10.5)

## 2019-02-28 LAB — HEPATIC FUNCTION PANEL
ALT: 28 U/L (ref 0–35)
AST: 30 U/L (ref 0–37)
Albumin: 4.8 g/dL (ref 3.5–5.2)
Alkaline Phosphatase: 61 U/L (ref 39–117)
Bilirubin, Direct: 0.1 mg/dL (ref 0.0–0.3)
Total Bilirubin: 0.7 mg/dL (ref 0.2–1.2)
Total Protein: 6.8 g/dL (ref 6.0–8.3)

## 2019-02-28 LAB — BASIC METABOLIC PANEL
BUN: 30 mg/dL — ABNORMAL HIGH (ref 6–23)
CO2: 22 mEq/L (ref 19–32)
Calcium: 9.6 mg/dL (ref 8.4–10.5)
Chloride: 105 mEq/L (ref 96–112)
Creatinine, Ser: 1.16 mg/dL (ref 0.40–1.20)
GFR: 45.04 mL/min — ABNORMAL LOW (ref >60.00)
Glucose, Bld: 89 mg/dL (ref 70–99)
Potassium: 4.5 mEq/L (ref 3.5–5.1)
Sodium: 138 mEq/L (ref 135–145)

## 2019-02-28 LAB — VITAMIN B12: Vitamin B-12: 482 pg/mL (ref 211–911)

## 2019-02-28 LAB — HEMOGLOBIN A1C: Hgb A1c MFr Bld: 5.5 % (ref 4.6–6.5)

## 2019-02-28 NOTE — Progress Notes (Signed)
Orders placed for add on labs.  

## 2019-03-07 ENCOUNTER — Encounter: Payer: Self-pay | Admitting: Internal Medicine

## 2019-03-07 ENCOUNTER — Ambulatory Visit (INDEPENDENT_AMBULATORY_CARE_PROVIDER_SITE_OTHER): Payer: Medicare Other | Admitting: Internal Medicine

## 2019-03-07 ENCOUNTER — Other Ambulatory Visit: Payer: Self-pay

## 2019-03-07 DIAGNOSIS — K219 Gastro-esophageal reflux disease without esophagitis: Secondary | ICD-10-CM | POA: Diagnosis not present

## 2019-03-07 DIAGNOSIS — I4891 Unspecified atrial fibrillation: Secondary | ICD-10-CM | POA: Diagnosis not present

## 2019-03-07 DIAGNOSIS — Z86010 Personal history of colonic polyps: Secondary | ICD-10-CM | POA: Diagnosis not present

## 2019-03-07 DIAGNOSIS — D649 Anemia, unspecified: Secondary | ICD-10-CM | POA: Diagnosis not present

## 2019-03-07 DIAGNOSIS — I712 Thoracic aortic aneurysm, without rupture, unspecified: Secondary | ICD-10-CM

## 2019-03-07 DIAGNOSIS — E7800 Pure hypercholesterolemia, unspecified: Secondary | ICD-10-CM | POA: Diagnosis not present

## 2019-03-07 DIAGNOSIS — I10 Essential (primary) hypertension: Secondary | ICD-10-CM | POA: Diagnosis not present

## 2019-03-07 DIAGNOSIS — G4730 Sleep apnea, unspecified: Secondary | ICD-10-CM

## 2019-03-07 DIAGNOSIS — R944 Abnormal results of kidney function studies: Secondary | ICD-10-CM | POA: Diagnosis not present

## 2019-03-07 NOTE — Progress Notes (Signed)
Patient ID: Leah Huber, female   DOB: [birth year], 78 y.o.   MRN: [id number]   Virtual Visit via telephone Note  This visit type was conducted due to national recommendations for restrictions regarding the COVID-19 pandemic (e.g. social distancing).  This format is felt to be most appropriate for this patient at this time.  All issues noted in this document were discussed and addressed.  No physical exam was performed (except for noted visual exam findings with Video Visits).   I connected with Leah Huber by telephone and verified that I am speaking with the correct person using two identifiers. Location patient: home Location provider: work  Persons participating in the telephone visit: patient, provider  I discussed the limitations, risks, security and privacy concerns of performing an evaluation and management service by telephone and the availability of in person appointments. The patient expressed understanding and agreed to proceed.   Reason for visit: scheduled follow up.   HPI: She reports she is doing relatively well.  Tries to stay active.  No chest pain.  No sob.  No acid reflux.  No abdominal pain.  Bowels moving.  Trying to stay in due to covid restrictions.  No fever.  No chest congestion or cough.  Being treated for sleep apnea.  Using cpap.  Sees cardiology for f/u afib.  On eliquis.  Will notice some intermittent increased heart rate.  Resolves on its own.  On metoprolol.  Overall she feels her hear is stable.  States blood pressure has been doing well.  Blood pressures averaging 110-117/70-80. Following with cardiology regarding her aortic aneurysm.     ROS: See pertinent positives and negatives per HPI.  Past Medical History:  Diagnosis Date  . Anemia   . Aortic aneurysm (HCC)    "SMALL"  . Arthritis    neck to tailbone  . Bilateral carpal tunnel syndrome 12/08/2017  . Bronchitis   . Chronic cough    @ NIGHT/ SEASONAL ALLERGIES  . Clostridium difficile  infection 2013-2014  . Colon polyp   . Coronary artery disease    Dr Nehemiah Massed cardiologist  . Depression   . Diverticulosis   . GERD (gastroesophageal reflux disease)   . Glaucoma   . Hypercholesterolemia   . Hypertension    CONTROLLED ON MEDS  . Hypovolemia 12/10   acute renal failure  . IBS (irritable bowel syndrome)   . Neuromuscular disorder ([place])    occasionally tingling in hands/ NUMBNESS IN RIGHT FOOT s/p back surgery  . Osteoarthritis    bilateral knees  . Osteopenia   . Pneumonia    HX OF  . Sleep apnea    very mild   sleep study 8 yrs ago does not use CPAP  . Urinary incontinence     Past Surgical History:  Procedure Laterality Date  . ABDOMINAL HYSTERECTOMY  1979  . ANTERIOR CERVICAL DECOMP/DISCECTOMY FUSION N/A 11/28/2014   Procedure: CERVICAL FIVE-SIX  ANTERIOR CERVICAL DECOMPRESSION/DISCECTOMY FUSION 1 LEVEL;  Surgeon: Newman Pies, MD;  Location: [facility];  Service: Neurosurgery;  Laterality: N/A;  C56 anterior cervical decompression with fusion interbody prosthesis plating and bonegraft  . BACK SURGERY     X2/ L4,5 & S1/ Cervical C7?  . BLADDER SURGERY  1981   SUSPENSION  . BREAST REDUCTION SURGERY    . CARPAL TUNNEL RELEASE  2007  . CATARACT EXTRACTION W/PHACO Right 12/30/2016   Procedure: CATARACT EXTRACTION PHACO AND INTRAOCULAR LENS PLACEMENT (IOC)  Right toric lens;  Surgeon: Leandrew Koyanagi,  MD;  Location: [facility];  Service: Ophthalmology;  Laterality: Right;  sleep apnea, does not use CPAP  . CATARACT EXTRACTION W/PHACO Left 01/20/2017   Procedure: CATARACT EXTRACTION PHACO AND INTRAOCULAR LENS PLACEMENT ([place])  Left Toric;  Surgeon: Leandrew Koyanagi, MD;  Location: [facility];  Service: Ophthalmology;  Laterality: Left;  toric lens  . CHOLECYSTECTOMY  1996  . COLONOSCOPY    . FOOT SURGERY  2005  . [place]  . REDUCTION MAMMAPLASTY Bilateral 2001  . [place] & 2004   left and right   . SPINAL CORD STIMULATOR IMPLANT  03/1997  . SPINAL CORD STIMULATOR REMOVAL  2007  . Tendon repair right shoulder    . TOTAL KNEE ARTHROPLASTY  10/2011 & 09/13   left and right    Family History  Problem Relation Age of Onset  . Heart disease Mother   . Stroke Mother   . Hypertension Mother   . Hyperlipidemia Father   . Heart disease Father   . Heart disease Brother        2 brothers  . Alzheimer's disease Brother   . Heart disease Sister        pacemaker  . Breast cancer Daughter 59    SOCIAL HX: reviewed.    Current Outpatient Medications:  .  apixaban (ELIQUIS) 5 MG TABS tablet, Take 5 mg by mouth 2 (two) times daily., Disp: , Rfl:  .  DETROL LA 4 MG 24 hr capsule, TAKE 1 CAPSULE DAILY, Disp: 90 capsule, Rfl: 4 .  latanoprost (XALATAN) 0.005 % ophthalmic solution, Place 1 drop into both eyes at bedtime. , Disp: , Rfl:  .  losartan (COZAAR) 50 MG tablet, TAKE 1 TABLET DAILY, Disp: 90 tablet, Rfl: 4 .  metoprolol succinate (TOPROL-XL) 50 MG 24 hr tablet, Take 50 mg by mouth daily. Take with or immediately following a meal., Disp: , Rfl:  .  pantoprazole (PROTONIX) 40 MG tablet, TAKE 1 TABLET TWICE A DAY, Disp: 180 tablet, Rfl: 4 .  rosuvastatin (CRESTOR) 20 MG tablet, TAKE 1 TABLET DAILY, Disp: 90 tablet, Rfl: 4  EXAM:  GENERAL: alert.  Answering questions appropriately.  Sounds to be in no acute distress.    PSYCH/NEURO: pleasant and cooperative, no obvious depression or anxiety, speech and thought processing grossly intact  ASSESSMENT AND PLAN:  Discussed the following assessment and plan:  A-fib ([place]) On eliquis.  Sees cardiology.  She feels - stable.  Occasional increased heart rate.  Follow.  Discussed f/u with cardiology.   Anemia Follow cbc.   Aortic aneurysm Followed by cardiology.    GERD (gastroesophageal reflux disease) Controlled on current regimen.    Hypercholesterolemia On crestor.  Low cholesterol diet and exercise.  Follow lipid panel and  liver function tests.    Hypertension Blood pressure under good control.  Continue same medication regimen.  Follow pressures.  Follow metabolic panel.    Sleep apnea Using cpap.  Doing well.  Follow.    History of colonic polyps Colonoscopy 01/2014.  Due f/u 01/2019.  Will need to f/u with colonoscopy when covid restrictions lifted.      I discussed the assessment and treatment plan with the patient. The patient was provided an opportunity to ask questions and all were answered. The patient agreed with the plan and demonstrated an understanding of the instructions.   The patient was advised to call back or seek an in-person evaluation if the symptoms worsen or  if the condition fails to improve as anticipated.  I provided 16 minutes of non-face-to-face time during this encounter.   Einar Pheasant, MD

## 2019-03-11 ENCOUNTER — Encounter: Payer: Self-pay | Admitting: Internal Medicine

## 2019-03-11 NOTE — Assessment & Plan Note (Signed)
Follow cbc.  

## 2019-03-11 NOTE — Assessment & Plan Note (Signed)
On eliquis.  Sees cardiology.  She feels - stable.  Occasional increased heart rate.  Follow.  Discussed f/u with cardiology.

## 2019-03-11 NOTE — Assessment & Plan Note (Signed)
On crestor.  Low cholesterol diet and exercise.  Follow lipid panel and liver function tests.   

## 2019-03-11 NOTE — Assessment & Plan Note (Signed)
Followed by cardiology 

## 2019-03-11 NOTE — Assessment & Plan Note (Signed)
Controlled on current regimen.   

## 2019-03-11 NOTE — Assessment & Plan Note (Signed)
Using cpap.  Doing well.  Follow.

## 2019-03-11 NOTE — Assessment & Plan Note (Signed)
Colonoscopy 01/2014.  Due f/u 01/2019.  Will need to f/u with colonoscopy when covid restrictions lifted.

## 2019-03-11 NOTE — Assessment & Plan Note (Signed)
Blood pressure under good control.  Continue same medication regimen.  Follow pressures.  Follow metabolic panel.   

## 2019-03-21 ENCOUNTER — Other Ambulatory Visit (INDEPENDENT_AMBULATORY_CARE_PROVIDER_SITE_OTHER): Payer: Medicare Other

## 2019-03-21 ENCOUNTER — Other Ambulatory Visit: Payer: Self-pay

## 2019-03-21 ENCOUNTER — Ambulatory Visit (INDEPENDENT_AMBULATORY_CARE_PROVIDER_SITE_OTHER): Payer: Medicare Other

## 2019-03-21 DIAGNOSIS — R944 Abnormal results of kidney function studies: Secondary | ICD-10-CM | POA: Diagnosis not present

## 2019-03-21 DIAGNOSIS — E538 Deficiency of other specified B group vitamins: Secondary | ICD-10-CM | POA: Diagnosis not present

## 2019-03-21 DIAGNOSIS — D7589 Other specified diseases of blood and blood-forming organs: Secondary | ICD-10-CM | POA: Diagnosis not present

## 2019-03-21 LAB — URINALYSIS, ROUTINE W REFLEX MICROSCOPIC
Bilirubin Urine: NEGATIVE
Ketones, ur: NEGATIVE
Nitrite: POSITIVE — AB
Specific Gravity, Urine: 1.025 (ref 1.000–1.030)
Total Protein, Urine: NEGATIVE
Urine Glucose: NEGATIVE
Urobilinogen, UA: 0.2 (ref 0.0–1.0)
pH: 5 (ref 5.0–8.0)

## 2019-03-21 LAB — BASIC METABOLIC PANEL
BUN: 26 mg/dL — ABNORMAL HIGH (ref 6–23)
CO2: 22 mEq/L (ref 19–32)
Calcium: 9.4 mg/dL (ref 8.4–10.5)
Chloride: 106 mEq/L (ref 96–112)
Creatinine, Ser: 0.98 mg/dL (ref 0.40–1.20)
GFR: 54.7 mL/min — ABNORMAL LOW (ref >60.00)
Glucose, Bld: 79 mg/dL (ref 70–99)
Potassium: 4.2 mEq/L (ref 3.5–5.1)
Sodium: 138 mEq/L (ref 135–145)

## 2019-03-21 MED ORDER — CYANOCOBALAMIN 1000 MCG/ML IJ SOLN
1000.0000 ug | Freq: Once | INTRAMUSCULAR | Status: AC
  Administered 2019-03-21: 1000 ug via INTRAMUSCULAR

## 2019-03-21 NOTE — Progress Notes (Signed)
Pt came in for b12 injection. Given in L deltoid. Tolerated well.

## 2019-03-22 ENCOUNTER — Other Ambulatory Visit: Payer: Self-pay | Admitting: Internal Medicine

## 2019-03-22 DIAGNOSIS — R319 Hematuria, unspecified: Secondary | ICD-10-CM

## 2019-03-22 NOTE — Progress Notes (Signed)
Order placed for f/u urine culture.

## 2019-03-23 ENCOUNTER — Other Ambulatory Visit: Payer: Self-pay | Admitting: Internal Medicine

## 2019-03-23 DIAGNOSIS — R319 Hematuria, unspecified: Secondary | ICD-10-CM

## 2019-03-23 LAB — FOLATE RBC: RBC Folate: 823 ng/mL RBC (ref >280)

## 2019-03-23 NOTE — Progress Notes (Signed)
Order placed for f/u urinalysis and urine culture.  

## 2019-03-27 NOTE — Progress Notes (Signed)
I have reviewed the above note and agree.  Lior Hoen, M.D.  

## 2019-03-29 ENCOUNTER — Other Ambulatory Visit (INDEPENDENT_AMBULATORY_CARE_PROVIDER_SITE_OTHER): Payer: Medicare Other

## 2019-03-29 ENCOUNTER — Other Ambulatory Visit: Payer: Self-pay

## 2019-03-29 DIAGNOSIS — R319 Hematuria, unspecified: Secondary | ICD-10-CM | POA: Diagnosis not present

## 2019-03-30 LAB — URINALYSIS, ROUTINE W REFLEX MICROSCOPIC
Bilirubin Urine: NEGATIVE
Hgb urine dipstick: NEGATIVE
Ketones, ur: NEGATIVE
Nitrite: NEGATIVE
RBC / HPF: NONE SEEN (ref 0–?)
Specific Gravity, Urine: 1.02 (ref 1.000–1.030)
Total Protein, Urine: NEGATIVE
Urine Glucose: NEGATIVE
Urobilinogen, UA: 0.2 (ref 0.0–1.0)
pH: 5.5 (ref 5.0–8.0)

## 2019-03-31 ENCOUNTER — Other Ambulatory Visit: Payer: Self-pay

## 2019-03-31 LAB — URINE CULTURE
MICRO NUMBER:: 692981
SPECIMEN QUALITY:: ADEQUATE

## 2019-03-31 MED ORDER — CEFDINIR 300 MG PO CAPS: 300.0000 mg | ORAL_CAPSULE | Freq: Two times a day (BID) | ORAL | 0 refills | Status: DC

## 2019-04-25 ENCOUNTER — Other Ambulatory Visit: Payer: Self-pay

## 2019-04-25 ENCOUNTER — Ambulatory Visit (INDEPENDENT_AMBULATORY_CARE_PROVIDER_SITE_OTHER): Payer: Medicare Other | Admitting: *Deleted

## 2019-04-25 DIAGNOSIS — E538 Deficiency of other specified B group vitamins: Secondary | ICD-10-CM | POA: Diagnosis not present

## 2019-04-25 MED ORDER — CYANOCOBALAMIN 1000 MCG/ML IJ SOLN
1000.0000 ug | Freq: Once | INTRAMUSCULAR | Status: AC
  Administered 2019-04-25: 16:00:00 1000 ug via INTRAMUSCULAR

## 2019-04-25 NOTE — Progress Notes (Addendum)
Patient presented for B 12 injection to left deltoid, patient voiced no concerns nor showed any signs of distress during injection.  Reviewed.  Dr Scott 

## 2019-05-04 ENCOUNTER — Ambulatory Visit (INDEPENDENT_AMBULATORY_CARE_PROVIDER_SITE_OTHER): Payer: Medicare Other

## 2019-05-04 ENCOUNTER — Other Ambulatory Visit: Payer: Self-pay

## 2019-05-04 DIAGNOSIS — Z0000 Encounter for general adult medical examination without abnormal findings: Secondary | ICD-10-CM

## 2019-05-04 NOTE — Progress Notes (Addendum)
Subjective:   Leah Huber is a 78 y.o. female who presents for Medicare Annual (Subsequent) preventive examination.  Review of Systems:  No ROS.  Medicare Wellness Virtual Visit.  Visual/audio telehealth visit, UTA vital signs.   See social history for additional risk factors.   Cardiac Risk Factors include: advanced age (>55men, >20 women);hypertension     Objective:     Vitals: LMP 08/15/1978   There is no height or weight on file to calculate BMI.  Advanced Directives 05/04/2019 10/26/2017 03/04/2017 01/20/2017 12/30/2016 09/18/2016 11/19/2014  Does Patient Have a Medical Advance Directive? Yes Yes No Yes Yes Yes Yes  Type of Paramedic of [place];Living will [place];Living will - [place];Living will [place];Living will [place];Living will -  Does patient want to make changes to medical advance directive? No - Patient declined No - Patient declined - No - Patient declined No - Patient declined No - Patient declined -  Copy of [place] in Chart? No - copy requested Yes - Yes No - copy requested Yes Yes  Would patient like information on creating a medical advance directive? - - No - Patient declined - - - -    Tobacco Social History   Tobacco Use  Smoking Status Former Smoker  . Packs/day: 1.00  . Years: 20.00  . Pack years: 20.00  . Types: Cigarettes  . Quit date: 09/07/1988  . Years since quitting: 30.6  Smokeless Tobacco Never Used     Counseling given: Not Answered   Clinical Intake:  Pre-visit preparation completed: Yes        Diabetes: No  How often do you need to have someone help you when you read instructions, pamphlets, or other written materials from your doctor or pharmacy?: 1 - Never  Interpreter Needed?: No     Past Medical History:  Diagnosis Date  . Anemia   . Aortic aneurysm (HCC)    "SMALL"  . Arthritis    neck to tailbone  . Bilateral carpal tunnel syndrome 12/08/2017  . Bronchitis   . Chronic cough    @ NIGHT/ SEASONAL ALLERGIES  . Clostridium difficile infection 2013-2014  . Colon polyp   . Coronary artery disease    Dr Nehemiah Massed cardiologist  . Depression   . Diverticulosis   . GERD (gastroesophageal reflux disease)   . Glaucoma   . Hypercholesterolemia   . Hypertension    CONTROLLED ON MEDS  . Hypovolemia 12/10   acute renal failure  . IBS (irritable bowel syndrome)   . Neuromuscular disorder ([place])    occasionally tingling in hands/ NUMBNESS IN RIGHT FOOT s/p back surgery  . Osteoarthritis    bilateral knees  . Osteopenia   . Pneumonia    HX OF  . Sleep apnea    very mild   sleep study 8 yrs ago does not use CPAP  . Urinary incontinence    Past Surgical History:  Procedure Laterality Date  . ABDOMINAL HYSTERECTOMY  1979  . ANTERIOR CERVICAL DECOMP/DISCECTOMY FUSION N/A 11/28/2014   Procedure: CERVICAL FIVE-SIX  ANTERIOR CERVICAL DECOMPRESSION/DISCECTOMY FUSION 1 LEVEL;  Surgeon: Newman Pies, MD;  Location: [facility];  Service: Neurosurgery;  Laterality: N/A;  C56 anterior cervical decompression with fusion interbody prosthesis plating and bonegraft  . BACK SURGERY     X2/ L4,5 & S1/ Cervical C7?  . BLADDER SURGERY  1981   SUSPENSION  . BREAST  REDUCTION SURGERY    . CARPAL TUNNEL RELEASE  2007  . CATARACT EXTRACTION W/PHACO Right 12/30/2016   Procedure: CATARACT EXTRACTION PHACO AND INTRAOCULAR LENS PLACEMENT (IOC)  Right toric lens;  Surgeon: Leandrew Koyanagi, MD;  Location: [place];  Service: Ophthalmology;  Laterality: Right;  sleep apnea, does not use CPAP  . CATARACT EXTRACTION W/PHACO Left 01/20/2017   Procedure: CATARACT EXTRACTION PHACO AND INTRAOCULAR LENS PLACEMENT ([place])  Left Toric;  Surgeon: Leandrew Koyanagi, MD;  Location: [place];  Service: Ophthalmology;  Laterality: Left;  toric lens  . CHOLECYSTECTOMY  1996  .  COLONOSCOPY    . FOOT SURGERY  2005  . [facility]  . REDUCTION MAMMAPLASTY Bilateral 2001  . [place] & 2004   left and right  . SPINAL CORD STIMULATOR IMPLANT  03/1997  . SPINAL CORD STIMULATOR REMOVAL  2007  . Tendon repair right shoulder    . TOTAL KNEE ARTHROPLASTY  10/2011 & 09/13   left and right   Family History  Problem Relation Age of Onset  . Heart disease Mother   . Stroke Mother   . Hypertension Mother   . Hyperlipidemia Father   . Heart disease Father   . Heart disease Brother        2 brothers  . Alzheimer's disease Brother   . Heart disease Sister        pacemaker  . Breast cancer Daughter 12   Social History   Socioeconomic History  . Marital status: Widowed    Spouse name: Not on file  . Number of children: 2  . Years of education: Not on file  . Highest education level: Not on file  Occupational History  . Not on file  Social Needs  . Financial resource strain: Not hard at all  . Food insecurity    Worry: Never true    Inability: Never true  . Transportation needs    Medical: No    Non-medical: No  Tobacco Use  . Smoking status: Former Smoker    Packs/day: 1.00    Years: 20.00    Pack years: 20.00    Types: Cigarettes    Quit date: 09/07/1988    Years since quitting: 30.6  . Smokeless tobacco: Never Used  Substance and Sexual Activity  . Alcohol use: Yes    Alcohol/week: 2.0 standard drinks    Types: 2 Glasses of wine per week    Comment: occasional  . Drug use: No  . Sexual activity: Never  Lifestyle  . Physical activity    Days per week: Not on file    Minutes per session: Not on file  . Stress: Not at all  Relationships  . Social Herbalist on phone: Not on file    Gets together: Not on file    Attends religious service: Not on file    Active member of club or organization: Not on file    Attends meetings of clubs or organizations: Not on file    Relationship status: Not on file  Other  Topics Concern  . Not on file  Social History Narrative   She is widowed, has two daughters.    Outpatient Encounter Medications as of 05/04/2019  Medication Sig  . apixaban (ELIQUIS) 5 MG TABS tablet Take 5 mg by mouth 2 (two) times daily.  Marland Kitchen DETROL LA 4 MG 24 hr capsule TAKE 1 CAPSULE DAILY  . latanoprost (XALATAN)  0.005 % ophthalmic solution Place 1 drop into both eyes at bedtime.   Marland Kitchen losartan (COZAAR) 50 MG tablet TAKE 1 TABLET DAILY  . metoprolol succinate (TOPROL-XL) 50 MG 24 hr tablet Take 50 mg by mouth daily. Take with or immediately following a meal.  . pantoprazole (PROTONIX) 40 MG tablet TAKE 1 TABLET TWICE A DAY  . rosuvastatin (CRESTOR) 20 MG tablet TAKE 1 TABLET DAILY  . [DISCONTINUED] cefdinir (OMNICEF) 300 MG capsule Take 1 capsule (300 mg total) by mouth 2 (two) times daily.   No facility-administered encounter medications on file as of 05/04/2019.     Activities of Daily Living In your present state of health, do you have any difficulty performing the following activities: 05/04/2019  Hearing? N  Vision? N  Difficulty concentrating or making decisions? N  Walking or climbing stairs? N  Dressing or bathing? N  Doing errands, shopping? N  Preparing Food and eating ? N  Using the Toilet? N  In the past six months, have you accidently leaked urine? N  Do you have problems with loss of bowel control? N  Managing your Medications? N  Managing your Finances? N  Housekeeping or managing your Housekeeping? N  Some recent data might be hidden    Patient Care Team: Einar Pheasant, MD as PCP - General (Internal Medicine)    Assessment:   This is a routine wellness examination for [facility].  I connected with patient 05/04/19 at 11:00 AM EDT by an audio enabled telemedicine application and verified that I am speaking with the correct person using two identifiers. Patient stated full name and DOB. Patient gave permission to continue with virtual visit. Patient's location  was at home and Nurse's location was at [place] office.   Health Maintenance Due: Influenza vaccine 2020- discussed; to be completed in season with doctor or local pharmacy.   Colonoscopy- scheduled 05/22/19 Tdap- discussed; to be completed with doctor in visit or local pharmacy.  Update all pending maintenance due as appropriate.   See completed HM at the end of note.   Eye: Visual acuity not assessed. Virtual visit. Wears corrective lenses. Followed by their ophthalmologist every 12 months. Glaucoma; drops in use. Cataracts extracted, bilateral.  Dental: UTD  Hearing: Demonstrates normal hearing during visit.  Safety:  Patient feels safe at home- yes Patient does have smoke detectors at home- yes Patient does wear sunscreen or protective clothing when in direct sunlight - yes Patient does wear seat belt when in a moving vehicle - yes Patient drives- yes Adequate lighting in walkways free from debris- yes Grab bars and handrails used as appropriate- yes Ambulates with no assistive device  Social: Alcohol intake - yes      Smoking history- former Smokers in home? none Illicit drug use? none  Depression: PHQ 2 &9 complete. See screening below. Denies irritability, anhedonia, sadness/tearfullness.  Stable.   Falls: See screening below.    Medication: Taking as directed and without issues.   Covid-19: Precautions and sickness symptoms discussed. Wears mask, social distancing, hand hygiene as appropriate. Daughter does grocery shopping during this season.  Activities of Daily Living Patient denies needing assistance with: household chores, feeding themselves, getting from bed to chair, getting to the toilet, bathing/showering, dressing, managing money, or preparing meals.   Memory: Patient is alert. Patient denies difficulty focusing or concentrating. Correctly identified the president of the [place], season and recall. Patient likes to read for brain stimulation.  BMI-  discussed the importance of a healthy diet,  water intake and the benefits of aerobic exercise.  Educational material provided.  Physical activity- walking   Diet: regular Water: good intake  Advanced Directive: End of life planning; Advance aging; Advanced directives discussed.  Copy of current HCPOA/Living Will requested.    Other Providers Patient Care Team: Einar Pheasant, MD as PCP - General (Internal Medicine)  Exercise Activities and Dietary recommendations Current Exercise Habits: Home exercise routine, Type of exercise: walking  Goals    . Maintain Healthy Lifestyle     Walk for exercise Stay hydrated Healthy diet       Fall Risk Fall Risk  05/04/2019 03/07/2019 10/26/2017 08/12/2017 07/27/2016  Falls in the past year? 0 0 Yes Yes No  Comment - - - Emmi Telephone Survey: data to providers prior to load -  Number falls in past yr: - - 1 1 -  Comment - - - Emmi Telephone Survey Actual Response = 1 -  Injury with Fall? - - Yes Yes -  Follow up - - Education provided - -  Comment - - Followed by PCP. Physical therapy completed on R shoulder.   - -   Timed Get Up and Go performed: no, virtual visit  Depression Screen PHQ 2/9 Scores 05/04/2019 10/26/2017 07/27/2016 11/08/2015  PHQ - 2 Score 0 0 0 0     Cognitive Function MMSE - Mini Mental State Exam 09/18/2016  Orientation to time 5  Orientation to Place 5  Registration 3  Attention/ Calculation 5  Recall 3  Language- name 2 objects 2  Language- repeat 1  Language- follow 3 step command 3  Language- read & follow direction 1  Write a sentence 1  Copy design 1  Total score 30     6CIT Screen 05/04/2019 10/26/2017  What Year? 0 points 0 points  What month? 0 points 0 points  What time? 0 points 0 points  Count back from 20 0 points 0 points  Months in reverse 0 points 0 points  Repeat phrase 0 points 0 points  Total Score 0 0    Immunization History  Administered Date(s) Administered  . Influenza Split  06/29/2014  . Influenza, High Dose Seasonal PF 07/27/2016, 06/27/2018  . Influenza,inj,Quad PF,6+ Mos 06/18/2015  . Influenza-Unspecified 06/18/2015, 07/27/2016, 05/22/2017  . Pneumococcal Conjugate-13 07/30/2014  . Pneumococcal Polysaccharide-23 03/18/2017   Screening Tests Health Maintenance  Topic Date Due  . TETANUS/TDAP  [birth year]03/1959  . COLONOSCOPY  01/20/2019  . INFLUENZA VACCINE  04/08/2019  . MAMMOGRAM  02/28/2020  . DEXA SCAN  Completed  . PNA vac Low Risk Adult  Completed      Plan:    Keep all routine maintenance appointments. Nurse visit - 05/30/19 B12 inj  Next scheduled lab -07/18/19 Follow up 4 month -07/20/19  Medicare Attestation I have personally reviewed: The patient's medical and social history Their use of alcohol, tobacco or illicit drugs Their current medications and supplements The patient's functional ability including ADLs,fall risks, home safety risks, cognitive, and hearing and visual impairment Diet and physical activities Evidence for depression   In addition, I have reviewed and discussed with patient certain preventive protocols, quality metrics, and best practice recommendations. A written personalized care plan for preventive services as well as general preventive health recommendations were provided to patient via mail.     OBrien-Blaney, Samantha Ragen L, LPN  [id number]    I have reviewed the above information and agree with above.   Deborra Medina, MD

## 2019-05-04 NOTE — Patient Instructions (Addendum)
  Leah Huber , Thank you for taking time to come for your Medicare Wellness Visit. I appreciate your ongoing commitment to your health goals. Please review the following plan we discussed and let me know if I can assist you in the future.   These are the goals we discussed: Goals    . Maintain Healthy Lifestyle     Walk for exercise Stay hydrated Healthy diet       This is a list of the screening recommended for you and due dates:  Health Maintenance  Topic Date Due  . Tetanus Vaccine  12/03/1958  . Colon Cancer Screening  01/20/2019  . Flu Shot  04/08/2019  . Mammogram  02/28/2020  . DEXA scan (bone density measurement)  Completed  . Pneumonia vaccines  Completed

## 2019-05-05 DIAGNOSIS — I25118 Atherosclerotic heart disease of native coronary artery with other forms of angina pectoris: Secondary | ICD-10-CM | POA: Diagnosis not present

## 2019-05-05 DIAGNOSIS — I10 Essential (primary) hypertension: Secondary | ICD-10-CM | POA: Diagnosis not present

## 2019-05-05 DIAGNOSIS — R002 Palpitations: Secondary | ICD-10-CM | POA: Diagnosis not present

## 2019-05-05 DIAGNOSIS — I480 Paroxysmal atrial fibrillation: Secondary | ICD-10-CM | POA: Diagnosis not present

## 2019-05-05 DIAGNOSIS — I712 Thoracic aortic aneurysm, without rupture: Secondary | ICD-10-CM | POA: Diagnosis not present

## 2019-05-17 DIAGNOSIS — I480 Paroxysmal atrial fibrillation: Secondary | ICD-10-CM | POA: Diagnosis not present

## 2019-05-18 ENCOUNTER — Other Ambulatory Visit: Payer: Self-pay

## 2019-05-18 ENCOUNTER — Other Ambulatory Visit
Admission: RE | Admit: 2019-05-18 | Discharge: 2019-05-18 | Disposition: A | Discharge status: Home / Self Care | Payer: Medicare Other | Source: Ambulatory Visit | Attending: Internal Medicine | Admitting: Internal Medicine

## 2019-05-18 DIAGNOSIS — Z01812 Encounter for preprocedural laboratory examination: Secondary | ICD-10-CM | POA: Insufficient documentation

## 2019-05-18 DIAGNOSIS — Z20828 Contact with and (suspected) exposure to other viral communicable diseases: Secondary | ICD-10-CM | POA: Diagnosis not present

## 2019-05-18 LAB — SARS CORONAVIRUS 2 (TAT 6-24 HRS): SARS Coronavirus 2: NEGATIVE

## 2019-05-19 ENCOUNTER — Encounter: Payer: Self-pay | Admitting: *Deleted

## 2019-05-22 ENCOUNTER — Ambulatory Visit
Admission: RE | Admit: 2019-05-22 | Discharge: 2019-05-22 | Disposition: A | Discharge status: Home / Self Care | Payer: Medicare Other | Attending: Internal Medicine | Admitting: Internal Medicine

## 2019-05-22 ENCOUNTER — Ambulatory Visit: Payer: Medicare Other | Admitting: Anesthesiology

## 2019-05-22 ENCOUNTER — Encounter
Admission: RE | Disposition: A | Discharge status: Home / Self Care | Payer: Self-pay | Source: Home / Self Care | Attending: Internal Medicine

## 2019-05-22 DIAGNOSIS — M1990 Unspecified osteoarthritis, unspecified site: Secondary | ICD-10-CM | POA: Insufficient documentation

## 2019-05-22 DIAGNOSIS — K635 Polyp of colon: Secondary | ICD-10-CM | POA: Diagnosis not present

## 2019-05-22 DIAGNOSIS — I10 Essential (primary) hypertension: Secondary | ICD-10-CM | POA: Insufficient documentation

## 2019-05-22 DIAGNOSIS — Q438 Other specified congenital malformations of intestine: Secondary | ICD-10-CM | POA: Diagnosis not present

## 2019-05-22 DIAGNOSIS — Z7982 Long term (current) use of aspirin: Secondary | ICD-10-CM | POA: Diagnosis not present

## 2019-05-22 DIAGNOSIS — Z7901 Long term (current) use of anticoagulants: Secondary | ICD-10-CM | POA: Insufficient documentation

## 2019-05-22 DIAGNOSIS — K5790 Diverticulosis of intestine, part unspecified, without perforation or abscess without bleeding: Secondary | ICD-10-CM | POA: Diagnosis not present

## 2019-05-22 DIAGNOSIS — K219 Gastro-esophageal reflux disease without esophagitis: Secondary | ICD-10-CM | POA: Insufficient documentation

## 2019-05-22 DIAGNOSIS — K6389 Other specified diseases of intestine: Secondary | ICD-10-CM | POA: Diagnosis not present

## 2019-05-22 DIAGNOSIS — G4730 Sleep apnea, unspecified: Secondary | ICD-10-CM | POA: Insufficient documentation

## 2019-05-22 DIAGNOSIS — Z1211 Encounter for screening for malignant neoplasm of colon: Secondary | ICD-10-CM | POA: Insufficient documentation

## 2019-05-22 DIAGNOSIS — Z79899 Other long term (current) drug therapy: Secondary | ICD-10-CM | POA: Insufficient documentation

## 2019-05-22 DIAGNOSIS — M8580 Other specified disorders of bone density and structure, unspecified site: Secondary | ICD-10-CM | POA: Insufficient documentation

## 2019-05-22 DIAGNOSIS — K5730 Diverticulosis of large intestine without perforation or abscess without bleeding: Secondary | ICD-10-CM | POA: Diagnosis not present

## 2019-05-22 DIAGNOSIS — D123 Benign neoplasm of transverse colon: Secondary | ICD-10-CM | POA: Diagnosis not present

## 2019-05-22 DIAGNOSIS — Z86010 Personal history of colonic polyps: Secondary | ICD-10-CM | POA: Diagnosis not present

## 2019-05-22 DIAGNOSIS — K648 Other hemorrhoids: Secondary | ICD-10-CM | POA: Diagnosis not present

## 2019-05-22 DIAGNOSIS — K640 First degree hemorrhoids: Secondary | ICD-10-CM | POA: Insufficient documentation

## 2019-05-22 DIAGNOSIS — H409 Unspecified glaucoma: Secondary | ICD-10-CM | POA: Insufficient documentation

## 2019-05-22 DIAGNOSIS — E7800 Pure hypercholesterolemia, unspecified: Secondary | ICD-10-CM | POA: Diagnosis not present

## 2019-05-22 DIAGNOSIS — I2510 Atherosclerotic heart disease of native coronary artery without angina pectoris: Secondary | ICD-10-CM | POA: Diagnosis not present

## 2019-05-22 HISTORY — DX: Personal history of colonic polyps: Z86.010

## 2019-05-22 HISTORY — DX: Concussion with loss of consciousness of unspecified duration, initial encounter: S06.0X9A

## 2019-05-22 HISTORY — DX: Unspecified hemorrhoids: K64.9

## 2019-05-22 HISTORY — PX: COLONOSCOPY WITH PROPOFOL: SHX5780

## 2019-05-22 HISTORY — DX: Concussion with loss of consciousness status unknown, initial encounter: S06.0XAA

## 2019-05-22 HISTORY — DX: Gout, unspecified: M10.9

## 2019-05-22 HISTORY — DX: Personal history of colon polyps, unspecified: Z86.0100

## 2019-05-22 HISTORY — DX: Personal history of other infectious and parasitic diseases: Z86.19

## 2019-05-22 SURGERY — COLONOSCOPY WITH PROPOFOL
Anesthesia: General

## 2019-05-22 MED ORDER — PHENYLEPHRINE HCL (PRESSORS) 10 MG/ML IV SOLN
INTRAVENOUS | Status: DC | PRN
  Administered 2019-05-22 (×2): 100 ug via INTRAVENOUS

## 2019-05-22 MED ORDER — PROPOFOL 500 MG/50ML IV EMUL
INTRAVENOUS | Status: DC | PRN
  Administered 2019-05-22: 100 ug/kg/min via INTRAVENOUS

## 2019-05-22 MED ORDER — LIDOCAINE HCL (PF) 2 % IJ SOLN
INTRAMUSCULAR | Status: AC
  Filled 2019-05-22: qty 10

## 2019-05-22 MED ORDER — PROPOFOL 500 MG/50ML IV EMUL
INTRAVENOUS | Status: AC
  Filled 2019-05-22: qty 50

## 2019-05-22 MED ORDER — ONDANSETRON HCL 4 MG/2ML IJ SOLN
INTRAMUSCULAR | Status: DC | PRN
  Administered 2019-05-22: 4 mg via INTRAVENOUS

## 2019-05-22 MED ORDER — PROPOFOL 10 MG/ML IV BOLUS
INTRAVENOUS | Status: AC
  Filled 2019-05-22: qty 20

## 2019-05-22 MED ORDER — PHENYLEPHRINE HCL (PRESSORS) 10 MG/ML IV SOLN
INTRAVENOUS | Status: AC
  Filled 2019-05-22: qty 1

## 2019-05-22 MED ORDER — ONDANSETRON HCL 4 MG/2ML IJ SOLN
INTRAMUSCULAR | Status: AC
  Filled 2019-05-22: qty 2

## 2019-05-22 MED ORDER — PROPOFOL 10 MG/ML IV BOLUS
INTRAVENOUS | Status: DC | PRN
  Administered 2019-05-22: 60 mg via INTRAVENOUS

## 2019-05-22 MED ORDER — SODIUM CHLORIDE 0.9 % IV SOLN
INTRAVENOUS | Status: DC
  Administered 2019-05-22: 1000 mL via INTRAVENOUS

## 2019-05-22 NOTE — H&P (Signed)
Outpatient short stay form Pre-procedure 05/22/2019 8:55 AM Clarrissa Shimkus K. Alice Reichert, M.D.  Primary Physician: Einar Pheasant, M.D.  Reason for visit:  Personal hx of adenomatous colon polyps.  History of present illness:                            Patient presents for colonoscopy for a personal hx of colon polyps. The patient denies abdominal pain, abnormal weight loss or rectal bleeding.    No current facility-administered medications for this encounter.   Medications Prior to Admission  Medication Sig Dispense Refill Last Dose  . aspirin EC 81 MG tablet Take 81 mg by mouth daily.     Marland Kitchen ibuprofen (ADVIL) 400 MG tablet Take 400 mg by mouth every 6 (six) hours as needed.     Marland Kitchen apixaban (ELIQUIS) 5 MG TABS tablet Take 5 mg by mouth 2 (two) times daily.     Marland Kitchen DETROL LA 4 MG 24 hr capsule TAKE 1 CAPSULE DAILY 90 capsule 4   . latanoprost (XALATAN) 0.005 % ophthalmic solution Place 1 drop into both eyes at bedtime.      Marland Kitchen losartan (COZAAR) 50 MG tablet TAKE 1 TABLET DAILY 90 tablet 4   . metoprolol succinate (TOPROL-XL) 50 MG 24 hr tablet Take 50 mg by mouth daily. Take with or immediately following a meal.     . pantoprazole (PROTONIX) 40 MG tablet TAKE 1 TABLET TWICE A DAY 180 tablet 4   . rosuvastatin (CRESTOR) 20 MG tablet TAKE 1 TABLET DAILY 90 tablet 4      Allergies  Allergen Reactions  . Clindamycin/Lincomycin Other (See Comments)    C. Diff  . Bactrim [Sulfamethoxazole-Trimethoprim] Other (See Comments)    Abdominal cramps  . Morphine Other (See Comments)    Went a little "crazy"  . Morphine And Related Other (See Comments)    HALLUCINATIONS  . Sulfa Antibiotics Other (See Comments)    Abdominal pain     Past Medical History:  Diagnosis Date  . Anemia   . Aortic aneurysm (HCC)    "SMALL"  . Arthritis    neck to tailbone  . Bilateral carpal tunnel syndrome 12/08/2017  . Bronchitis   . Chronic cough    @ NIGHT/ SEASONAL ALLERGIES  . Clostridium difficile infection  2013-2014  . Colon polyp   . Concussion   . Coronary artery disease    Dr Nehemiah Massed cardiologist  . Depression   . Diverticulosis   . GERD (gastroesophageal reflux disease)   . Glaucoma   . Gout   . Hemorrhoids   . History of chicken pox   . History of colonic polyps   . History of shingles   . Hypercholesterolemia   . Hypertension    CONTROLLED ON MEDS  . Hypovolemia 12/10   acute renal failure  . IBS (irritable bowel syndrome)   . Neuromuscular disorder ([place])    occasionally tingling in hands/ NUMBNESS IN RIGHT FOOT s/p back surgery  . Osteoarthritis    bilateral knees  . Osteopenia   . Pneumonia    HX OF  . Sleep apnea    very mild   sleep study 8 yrs ago does not use CPAP  . Urinary incontinence     Review of systems:  Otherwise negative.    Physical Exam  Gen: Alert, oriented. Appears stated age.  HEENT: [place]/AT. PERRLA. Lungs: CTA, no wheezes. CV: RR nl S1, S2. Abd: soft, benign, no  masses. BS+ Ext: No edema. Pulses 2+    Planned procedures: Proceed with colonoscopy. The patient understands the nature of the planned procedure, indications, risks, alternatives and potential complications including but not limited to bleeding, infection, perforation, damage to internal organs and possible oversedation/side effects from anesthesia. The patient agrees and gives consent to proceed.  Please refer to procedure notes for findings, recommendations and patient disposition/instructions.     Akela Pocius K. Alice Reichert, M.D. Gastroenterology 05/22/2019  8:55 AM

## 2019-05-22 NOTE — Anesthesia Preprocedure Evaluation (Signed)
Anesthesia Evaluation  Patient identified by MRN, date of birth, ID band Patient awake    History of Anesthesia Complications Negative for: history of anesthetic complications  Airway Mallampati: II       Dental   Pulmonary sleep apnea (not using CPAP) , neg COPD, Not current smoker, former smoker,           Cardiovascular hypertension, Pt. on medications and Pt. on home beta blockers (-) Past MI and (-) CHF + dysrhythmias Atrial Fibrillation (-) Valvular Problems/Murmurs     Neuro/Psych neg Seizures Depression    GI/Hepatic Neg liver ROS, GERD  Medicated,  Endo/Other  neg diabetes  Renal/GU negative Renal ROS     Musculoskeletal   Abdominal   Peds  Hematology  (+) anemia ,   Anesthesia Other Findings   Reproductive/Obstetrics                            Anesthesia Physical Anesthesia Plan  ASA: III  Anesthesia Plan: General   Post-op Pain Management:    Induction:   PONV Risk Score and Plan: 3 and Propofol infusion, TIVA and Ondansetron  Airway Management Planned: Nasal Cannula  Additional Equipment:   Intra-op Plan:   Post-operative Plan:   Informed Consent: I have reviewed the patients History and Physical, chart, labs and discussed the procedure including the risks, benefits and alternatives for the proposed anesthesia with the patient or authorized representative who has indicated his/her understanding and acceptance.       Plan Discussed with:   Anesthesia Plan Comments:         Anesthesia Quick Evaluation

## 2019-05-22 NOTE — Anesthesia Post-op Follow-up Note (Signed)
Anesthesia QCDR form completed.        

## 2019-05-22 NOTE — Transfer of Care (Signed)
Immediate Anesthesia Transfer of Care Note  Patient: Leah Huber  Procedure(s) Performed: COLONOSCOPY WITH PROPOFOL (N/A )  Patient Location: Endoscopy Unit  Anesthesia Type:General  Level of Consciousness: drowsy and patient cooperative  Airway & Oxygen Therapy: Patient Spontanous Breathing and Patient connected to nasal cannula oxygen  Post-op Assessment: Report given to RN, Post -op Vital signs reviewed and stable and Patient moving all extremities X 4  Post vital signs: Reviewed and stable  Last Vitals:  Vitals Value Taken Time  BP 139/80 05/22/19 1052  Temp    Pulse 87 05/22/19 1053  Resp 24 05/22/19 1053  SpO2 99 % 05/22/19 1053  Vitals shown include unvalidated device data.  Last Pain:  Vitals:   05/22/19 0915  TempSrc: Tympanic  PainSc: 1          Complications: No apparent anesthesia complications

## 2019-05-22 NOTE — Interval H&P Note (Signed)
History and Physical Interval Note:  05/22/2019 8:56 AM  Leah Huber  has presented today for surgery, with the diagnosis of PH POLYPS.  The various methods of treatment have been discussed with the patient and family. After consideration of risks, benefits and other options for treatment, the patient has consented to  Procedure(s): COLONOSCOPY WITH PROPOFOL (N/A) as a surgical intervention.  The patient's history has been reviewed, patient examined, no change in status, stable for surgery.  I have reviewed the patient's chart and labs.  Questions were answered to the patient's satisfaction.     [place], [place]

## 2019-05-22 NOTE — Anesthesia Postprocedure Evaluation (Signed)
Anesthesia Post Note  Patient: Leah Huber  Procedure(s) Performed: COLONOSCOPY WITH PROPOFOL (N/A )  Patient location during evaluation: Endoscopy Anesthesia Type: General Level of consciousness: awake and alert Pain management: pain level controlled Vital Signs Assessment: post-procedure vital signs reviewed and stable Respiratory status: spontaneous breathing and respiratory function stable Cardiovascular status: stable Anesthetic complications: no     Last Vitals:  Vitals:   05/22/19 1052 05/22/19 1102  BP: 139/80 114/80  Pulse: (!) 104 92  Resp: 20 17  Temp: (!) 35.8 C   SpO2: 100% 100%    Last Pain:  Vitals:   05/22/19 1102  TempSrc:   PainSc: 6                  Timesha Cervantez K

## 2019-05-22 NOTE — Op Note (Signed)
[facility] Patient Name: Leah Huber Procedure Date: 05/22/2019 10:02 AM MRN: [id number] Account #: [id number] Date of Birth: 1941/07/24 Admit Type: Outpatient Age: 78 Room: [facility] ENDO ROOM 2 Gender: Female Note Status: Finalized Procedure:            Colonoscopy Indications:          Surveillance: Personal history of adenomatous polyps on                        last colonoscopy > 5 years ago Providers:            Lorie Apley K. Alice Reichert MD, MD Referring MD:         Charlott Holler. Scott (Referring MD) Medicines:            Propofol per Anesthesia Complications:        No immediate complications. Procedure:            Pre-Anesthesia Assessment:                       - The risks and benefits of the procedure and the                        sedation options and risks were discussed with the                        patient. All questions were answered and informed                        consent was obtained.                       - Patient identification and proposed procedure were                        verified prior to the procedure by the nurse. The                        procedure was verified in the procedure room.                       - ASA Grade Assessment: III - A patient with severe                        systemic disease.                       - After reviewing the risks and benefits, the patient                        was deemed in satisfactory condition to undergo the                        procedure.                       After obtaining informed consent, the colonoscope was                        passed under direct vision. Throughout the procedure,  the patient's blood pressure, pulse, and oxygen                        saturations were monitored continuously. The                        Colonoscope was introduced through the anus and                        advanced to the the cecum, identified by appendiceal           orifice and ileocecal valve. The colonoscopy was                        technically difficult and complex due to restricted                        mobility of the colon, a redundant colon and                        significant looping. Successful completion of the                        procedure was aided by withdrawing the scope and                        replacing with the pediatric colonoscope. The patient                        tolerated the procedure well. The quality of the bowel                        preparation was good. The ileocecal valve, appendiceal                        orifice, and rectum were photographed. Findings:      The perianal and digital rectal examinations were normal. Pertinent       negatives include normal sphincter tone and no palpable rectal lesions.      Many small and large-mouthed diverticula were found in the left colon.      The colon (entire examined portion) was moderately redundant. Advancing       the scope required withdrawing the scope and replacing with the       pediatric colonoscope.      A 8 mm polyp was found in the hepatic flexure. The polyp was sessile.       The polyp was removed with a cold snare. Resection and retrieval were       complete.      Internal hemorrhoids were found during retroflexion. The hemorrhoids       were Grade I (internal hemorrhoids that do not prolapse).      The exam was otherwise without abnormality. Impression:           - Diverticulosis in the left colon.                       - Redundant colon.                       - One 8 mm polyp at the hepatic flexure, removed with a  cold snare. Resected and retrieved.                       - Internal hemorrhoids.                       - The examination was otherwise normal.                       - Barotrauma with erytematous, friable mucosa in the                        right colon. Recommendation:       - Patient has a contact number  available for                        emergencies. The signs and symptoms of potential                        delayed complications were discussed with the patient.                        Return to normal activities tomorrow. Written discharge                        instructions were provided to the patient.                       - Resume previous diet.                       - Continue present medications.                       - Await pathology results.                       - No repeat colonoscopy due to current age (52 years or                        older).                       - Return to GI office PRN.                       - The findings and recommendations were discussed with                        the patient. Procedure Code(s):    --- Professional ---                       [phone], Colonoscopy, flexible; with removal of tumor(s),                        polyp(s), or other lesion(s) by snare technique Diagnosis Code(s):    --- Professional ---                       Q43.8, Other specified congenital malformations of                        intestine  K57.30, Diverticulosis of large intestine without                        perforation or abscess without bleeding                       K63.89, Other specified diseases of intestine                       K63.5, Polyp of colon                       K64.0, First degree hemorrhoids                       Z86.010, Personal history of colonic polyps CPT copyright 2019 American [employer] Association. All rights reserved. The codes documented in this report are preliminary and upon coder review may  be revised to meet current compliance requirements. Efrain Sella MD, MD 05/22/2019 10:53:30 AM This report has been signed electronically. Number of Addenda: 0 Note Initiated On: 05/22/2019 10:02 AM Scope Withdrawal Time: 0 hours 5 minutes 46 seconds  Total Procedure Duration: 0 hours 36 minutes 10 seconds  Estimated Blood  Loss: Estimated blood loss: none. Estimated blood loss was                        minimal.      [facility]

## 2019-05-23 ENCOUNTER — Encounter: Payer: Self-pay | Admitting: Internal Medicine

## 2019-05-23 LAB — SURGICAL PATHOLOGY

## 2019-05-25 DIAGNOSIS — I10 Essential (primary) hypertension: Secondary | ICD-10-CM | POA: Diagnosis not present

## 2019-05-25 DIAGNOSIS — I480 Paroxysmal atrial fibrillation: Secondary | ICD-10-CM | POA: Diagnosis not present

## 2019-05-25 DIAGNOSIS — I6523 Occlusion and stenosis of bilateral carotid arteries: Secondary | ICD-10-CM | POA: Diagnosis not present

## 2019-05-25 DIAGNOSIS — I2510 Atherosclerotic heart disease of native coronary artery without angina pectoris: Secondary | ICD-10-CM | POA: Diagnosis not present

## 2019-05-25 DIAGNOSIS — I712 Thoracic aortic aneurysm, without rupture: Secondary | ICD-10-CM | POA: Diagnosis not present

## 2019-05-30 ENCOUNTER — Other Ambulatory Visit: Payer: Self-pay

## 2019-05-30 ENCOUNTER — Ambulatory Visit (INDEPENDENT_AMBULATORY_CARE_PROVIDER_SITE_OTHER): Payer: Medicare Other | Admitting: *Deleted

## 2019-05-30 DIAGNOSIS — E538 Deficiency of other specified B group vitamins: Secondary | ICD-10-CM

## 2019-05-30 DIAGNOSIS — Z23 Encounter for immunization: Secondary | ICD-10-CM

## 2019-05-30 MED ORDER — CYANOCOBALAMIN 1000 MCG/ML IJ SOLN
1000.0000 ug | Freq: Once | INTRAMUSCULAR | Status: AC
  Administered 2019-05-30: 15:00:00 1000 ug via INTRAMUSCULAR

## 2019-05-30 NOTE — Progress Notes (Addendum)
Patient presented for B 12 injection to left deltoid, patient voiced no concerns nor showed any signs of distress during injection.  Reviewed.  Dr Scott 

## 2019-06-13 ENCOUNTER — Other Ambulatory Visit: Payer: Self-pay | Admitting: Internal Medicine

## 2019-07-04 ENCOUNTER — Ambulatory Visit (INDEPENDENT_AMBULATORY_CARE_PROVIDER_SITE_OTHER): Payer: Medicare Other | Admitting: *Deleted

## 2019-07-04 ENCOUNTER — Other Ambulatory Visit: Payer: Self-pay

## 2019-07-04 DIAGNOSIS — E538 Deficiency of other specified B group vitamins: Secondary | ICD-10-CM | POA: Diagnosis not present

## 2019-07-04 MED ORDER — CYANOCOBALAMIN 1000 MCG/ML IJ SOLN
1000.0000 ug | Freq: Once | INTRAMUSCULAR | Status: AC
  Administered 2019-07-04: 17:00:00 1000 ug via INTRAMUSCULAR

## 2019-07-04 NOTE — Progress Notes (Addendum)
Patient presented for B 12 injection to right deltoid, patient voiced no concerns nor showed any signs of distress during injection.  Reviewed.  Dr Scott 

## 2019-07-09 ENCOUNTER — Other Ambulatory Visit: Payer: Self-pay | Admitting: Internal Medicine

## 2019-07-14 ENCOUNTER — Other Ambulatory Visit: Payer: Self-pay

## 2019-07-14 ENCOUNTER — Telehealth: Payer: Self-pay | Admitting: *Deleted

## 2019-07-14 DIAGNOSIS — I10 Essential (primary) hypertension: Secondary | ICD-10-CM

## 2019-07-14 DIAGNOSIS — R739 Hyperglycemia, unspecified: Secondary | ICD-10-CM

## 2019-07-14 DIAGNOSIS — E7800 Pure hypercholesterolemia, unspecified: Secondary | ICD-10-CM

## 2019-07-14 NOTE — Telephone Encounter (Signed)
Please place future orders for lab appt.  

## 2019-07-14 NOTE — Telephone Encounter (Signed)
Orders placed for labs

## 2019-07-18 ENCOUNTER — Other Ambulatory Visit: Payer: Self-pay

## 2019-07-18 ENCOUNTER — Other Ambulatory Visit (INDEPENDENT_AMBULATORY_CARE_PROVIDER_SITE_OTHER): Payer: Medicare Other

## 2019-07-18 DIAGNOSIS — I10 Essential (primary) hypertension: Secondary | ICD-10-CM

## 2019-07-18 DIAGNOSIS — R739 Hyperglycemia, unspecified: Secondary | ICD-10-CM

## 2019-07-18 DIAGNOSIS — E7800 Pure hypercholesterolemia, unspecified: Secondary | ICD-10-CM

## 2019-07-18 LAB — LIPID PANEL
Cholesterol: 187 mg/dL (ref 0–200)
HDL: 90.6 mg/dL (ref >39.00)
LDL Cholesterol: 65 mg/dL (ref 0–99)
NonHDL: 96.24
Total CHOL/HDL Ratio: 2
Triglycerides: 158 mg/dL — ABNORMAL HIGH (ref 0.0–149.0)
VLDL: 31.6 mg/dL (ref 0.0–40.0)

## 2019-07-18 LAB — HEPATIC FUNCTION PANEL
ALT: 30 U/L (ref 0–35)
AST: 44 U/L — ABNORMAL HIGH (ref 0–37)
Albumin: 4.7 g/dL (ref 3.5–5.2)
Alkaline Phosphatase: 56 U/L (ref 39–117)
Bilirubin, Direct: 0.1 mg/dL (ref 0.0–0.3)
Total Bilirubin: 0.6 mg/dL (ref 0.2–1.2)
Total Protein: 6.8 g/dL (ref 6.0–8.3)

## 2019-07-18 LAB — CBC WITH DIFFERENTIAL/PLATELET
Basophils Absolute: 0.1 10*3/uL (ref 0.0–0.1)
Basophils Relative: 1 % (ref 0.0–3.0)
Eosinophils Absolute: 0.1 10*3/uL (ref 0.0–0.7)
Eosinophils Relative: 2.3 % (ref 0.0–5.0)
HCT: 36.8 % (ref 36.0–46.0)
Hemoglobin: 12.2 g/dL (ref 12.0–15.0)
Lymphocytes Relative: 47.3 % — ABNORMAL HIGH (ref 12.0–46.0)
Lymphs Abs: 2.6 10*3/uL (ref 0.7–4.0)
MCHC: 33.2 g/dL (ref 30.0–36.0)
MCV: 101.9 fl — ABNORMAL HIGH (ref 78.0–100.0)
Monocytes Absolute: 0.4 10*3/uL (ref 0.1–1.0)
Monocytes Relative: 7.3 % (ref 3.0–12.0)
Neutro Abs: 2.3 10*3/uL (ref 1.4–7.7)
Neutrophils Relative %: 42.1 % — ABNORMAL LOW (ref 43.0–77.0)
Platelets: 259 10*3/uL (ref 150.0–400.0)
RBC: 3.61 Mil/uL — ABNORMAL LOW (ref 3.87–5.11)
RDW: 13.5 % (ref 11.5–15.5)
WBC: 5.5 10*3/uL (ref 4.0–10.5)

## 2019-07-18 LAB — BASIC METABOLIC PANEL
BUN: 23 mg/dL (ref 6–23)
CO2: 24 mEq/L (ref 19–32)
Calcium: 9.5 mg/dL (ref 8.4–10.5)
Chloride: 106 mEq/L (ref 96–112)
Creatinine, Ser: 1 mg/dL (ref 0.40–1.20)
GFR: 53.4 mL/min — ABNORMAL LOW (ref >60.00)
Glucose, Bld: 91 mg/dL (ref 70–99)
Potassium: 4.5 mEq/L (ref 3.5–5.1)
Sodium: 139 mEq/L (ref 135–145)

## 2019-07-18 LAB — HEMOGLOBIN A1C: Hgb A1c MFr Bld: 5.5 % (ref 4.6–6.5)

## 2019-07-18 LAB — TSH: TSH: 2.22 u[IU]/mL (ref 0.35–4.50)

## 2019-07-20 ENCOUNTER — Ambulatory Visit: Payer: Medicare Other | Admitting: Internal Medicine

## 2019-07-20 ENCOUNTER — Other Ambulatory Visit: Payer: Self-pay

## 2019-07-20 ENCOUNTER — Ambulatory Visit (INDEPENDENT_AMBULATORY_CARE_PROVIDER_SITE_OTHER): Payer: Medicare Other | Admitting: Internal Medicine

## 2019-07-20 VITALS — BP 118/70 | Ht 64.0 in | Wt 153.2 lb

## 2019-07-20 DIAGNOSIS — I4891 Unspecified atrial fibrillation: Secondary | ICD-10-CM

## 2019-07-20 DIAGNOSIS — I10 Essential (primary) hypertension: Secondary | ICD-10-CM

## 2019-07-20 DIAGNOSIS — I712 Thoracic aortic aneurysm, without rupture, unspecified: Secondary | ICD-10-CM

## 2019-07-20 DIAGNOSIS — R945 Abnormal results of liver function studies: Secondary | ICD-10-CM

## 2019-07-20 DIAGNOSIS — D649 Anemia, unspecified: Secondary | ICD-10-CM | POA: Diagnosis not present

## 2019-07-20 DIAGNOSIS — G4730 Sleep apnea, unspecified: Secondary | ICD-10-CM

## 2019-07-20 DIAGNOSIS — E538 Deficiency of other specified B group vitamins: Secondary | ICD-10-CM

## 2019-07-20 DIAGNOSIS — E7800 Pure hypercholesterolemia, unspecified: Secondary | ICD-10-CM | POA: Diagnosis not present

## 2019-07-20 DIAGNOSIS — R7989 Other specified abnormal findings of blood chemistry: Secondary | ICD-10-CM

## 2019-07-20 NOTE — Progress Notes (Signed)
Patient ID: Leah Huber, female   DOB: 12-15-1940, 78 y.o.   MRN: [id number]   Virtual Visit via video Note  This visit type was conducted due to national recommendations for restrictions regarding the COVID-19 pandemic (e.g. social distancing).  This format is felt to be most appropriate for this patient at this time.  All issues noted in this document were discussed and addressed.  No physical exam was performed (except for noted visual exam findings with Video Visits).   I connected with Leah Huber by a video enabled telemedicine application and verified that I am speaking with the correct person using two identifiers. Location patient: home Location provider: work  Persons participating in the virtual visit: patient, provider  I discussed the limitations, risks, security and privacy concerns of performing an evaluation and management service by telephone and the availability of in person appointments. The patient expressed understanding and agreed to proceed.   Reason for visit: scheduled follow up.   HPI: She reports she is doing well.  Feels good.  Trying to stay active.  No chest pain.  No sob.  No acid reflux.  No abdominal pain.  Bowels moving.  Saw cardiology 05/25/19.  Stable.  Had colonoscopy 05/2019.  Blood pressure doing well.  Taking crestor.  Appetite good.  Overall feels good.     ROS: See pertinent positives and negatives per HPI.  Past Medical History:  Diagnosis Date  . Abnormal liver function test 07/23/2019  . Anemia   . Aortic aneurysm (HCC)    "SMALL"  . Arthritis    neck to tailbone  . Bilateral carpal tunnel syndrome 12/08/2017  . Bronchitis   . Chronic cough    @ NIGHT/ SEASONAL ALLERGIES  . Clostridium difficile infection 2013-2014  . Colon polyp   . Concussion   . Coronary artery disease    Dr Nehemiah Massed cardiologist  . Depression   . Diverticulosis   . GERD (gastroesophageal reflux disease)   . Glaucoma   . Gout   . Hemorrhoids   . History of  chicken pox   . History of colonic polyps   . History of shingles   . Hypercholesterolemia   . Hypertension    CONTROLLED ON MEDS  . Hypovolemia 12/10   acute renal failure  . IBS (irritable bowel syndrome)   . Neuromuscular disorder ([place])    occasionally tingling in hands/ NUMBNESS IN RIGHT FOOT s/p back surgery  . Osteoarthritis    bilateral knees  . Osteopenia   . Pneumonia    HX OF  . Sleep apnea    very mild   sleep study 8 yrs ago does not use CPAP  . Urinary incontinence     Past Surgical History:  Procedure Laterality Date  . ABDOMINAL HYSTERECTOMY  1979  . ANTERIOR CERVICAL DECOMP/DISCECTOMY FUSION N/A 11/28/2014   Procedure: CERVICAL FIVE-SIX  ANTERIOR CERVICAL DECOMPRESSION/DISCECTOMY FUSION 1 LEVEL;  Surgeon: Newman Pies, MD;  Location: [place] [facility];  Service: Neurosurgery;  Laterality: N/A;  C56 anterior cervical decompression with fusion interbody prosthesis plating and bonegraft  . BACK SURGERY     X2/ L4,5 & S1/ Cervical C7?  . BLADDER SURGERY  1981   SUSPENSION  . BREAST REDUCTION SURGERY    . CARPAL TUNNEL RELEASE  2007  . CATARACT EXTRACTION W/PHACO Right 12/30/2016   Procedure: CATARACT EXTRACTION PHACO AND INTRAOCULAR LENS PLACEMENT (IOC)  Right toric lens;  Surgeon: Leandrew Koyanagi, MD;  Location: [place];  Service: Ophthalmology;  Laterality: Right;  sleep apnea, does not use CPAP  . CATARACT EXTRACTION W/PHACO Left 01/20/2017   Procedure: CATARACT EXTRACTION PHACO AND INTRAOCULAR LENS PLACEMENT ([place])  Left Toric;  Surgeon: Leandrew Koyanagi, MD;  Location: [place];  Service: Ophthalmology;  Laterality: Left;  toric lens  . CHOLECYSTECTOMY  1996  . COLONOSCOPY    . COLONOSCOPY WITH PROPOFOL N/A 05/22/2019   Procedure: COLONOSCOPY WITH PROPOFOL;  Surgeon: Toledo, Benay Pike, MD;  Location: [facility] ENDOSCOPY;  Service: Gastroenterology;  Laterality: N/A;  . FOOT SURGERY  2005  . JOINT REPLACEMENT Bilateral    KNEE  .  [place]  . REDUCTION MAMMAPLASTY Bilateral 2001  . [place] & 2004   left and right  . SPINAL CORD STIMULATOR IMPLANT  03/1997  . SPINAL CORD STIMULATOR REMOVAL  2007  . Tendon repair right shoulder    . TONSILLECTOMY    . TOTAL KNEE ARTHROPLASTY  10/2011 & 09/13   left and right    Family History  Problem Relation Age of Onset  . Heart disease Mother   . Stroke Mother   . Hypertension Mother   . Hyperlipidemia Father   . Heart disease Father   . Heart disease Brother        2 brothers  . Alzheimer's disease Brother   . Heart disease Sister        pacemaker  . Breast cancer Daughter 22    SOCIAL HX: reviewed.    Current Outpatient Medications:  .  apixaban (ELIQUIS) 5 MG TABS tablet, Take 5 mg by mouth 2 (two) times daily., Disp: , Rfl:  .  DETROL LA 4 MG 24 hr capsule, TAKE 1 CAPSULE DAILY, Disp: 90 capsule, Rfl: 4 .  latanoprost (XALATAN) 0.005 % ophthalmic solution, Place 1 drop into both eyes at bedtime. , Disp: , Rfl:  .  losartan (COZAAR) 50 MG tablet, TAKE 1 TABLET DAILY, Disp: 90 tablet, Rfl: 3 .  metoprolol succinate (TOPROL-XL) 50 MG 24 hr tablet, Take 50 mg by mouth daily. Take with or immediately following a meal., Disp: , Rfl:  .  pantoprazole (PROTONIX) 40 MG tablet, TAKE 1 TABLET TWICE A DAY, Disp: 180 tablet, Rfl: 3 .  rosuvastatin (CRESTOR) 20 MG tablet, TAKE 1 TABLET DAILY, Disp: 90 tablet, Rfl: 4  EXAM:  VITALS per patient if applicable: [id number]  GENERAL: alert, oriented, appears well and in no acute distress  HEENT: atraumatic, conjunttiva clear, no obvious abnormalities on inspection of external nose and ears  NECK: normal movements of the head and neck  LUNGS: on inspection no signs of respiratory distress, breathing rate appears normal, no obvious gross SOB, gasping or wheezing  CV: no obvious cyanosis  PSYCH/NEURO: pleasant and cooperative, no obvious depression or anxiety, speech and thought processing  grossly intact  ASSESSMENT AND PLAN:  Discussed the following assessment and plan:  A-fib ([place]) Followed by cardiology.  On eliquis.  Stable.  Just evaluated 05/25/19.    Anemia Follow cbc.   Aortic aneurysm Followed by cardiology.  She states they are following and is stable.    Hypercholesterolemia On crestor.  Low cholesterol diet and exercise.  Follow lipid panel and liver function tests.    Hypertension Blood pressure under good control.  Continue same medication regimen.  Follow pressures.  Follow metabolic panel.    Sleep apnea  CPAP.    Abnormal liver function test One liver test slightly elevated. Remainder of liver function tests  wnl.  Recheck liver panel.     I discussed the assessment and treatment plan with the patient. The patient was provided an opportunity to ask questions and all were answered. The patient agreed with the plan and demonstrated an understanding of the instructions.   The patient was advised to call back or seek an in-person evaluation if the symptoms worsen or if the condition fails to improve as anticipated.   Einar Pheasant, MD

## 2019-07-23 ENCOUNTER — Encounter: Payer: Self-pay | Admitting: Internal Medicine

## 2019-07-23 DIAGNOSIS — R945 Abnormal results of liver function studies: Secondary | ICD-10-CM | POA: Insufficient documentation

## 2019-07-23 DIAGNOSIS — R7989 Other specified abnormal findings of blood chemistry: Secondary | ICD-10-CM | POA: Insufficient documentation

## 2019-07-23 HISTORY — DX: Abnormal results of liver function studies: R94.5

## 2019-07-23 HISTORY — DX: Other specified abnormal findings of blood chemistry: R79.89

## 2019-07-23 NOTE — Assessment & Plan Note (Signed)
On crestor.  Low cholesterol diet and exercise.  Follow lipid panel and liver function tests.   

## 2019-07-23 NOTE — Assessment & Plan Note (Signed)
Followed by cardiology.  On eliquis.  Stable.  Just evaluated 05/25/19.

## 2019-07-23 NOTE — Assessment & Plan Note (Signed)
One liver test slightly elevated. Remainder of liver function tests wnl.  Recheck liver panel.

## 2019-07-23 NOTE — Assessment & Plan Note (Signed)
Follow cbc.  

## 2019-07-23 NOTE — Assessment & Plan Note (Signed)
CPAP.  

## 2019-07-23 NOTE — Assessment & Plan Note (Signed)
Followed by cardiology.  She states they are following and is stable.

## 2019-07-23 NOTE — Assessment & Plan Note (Signed)
Blood pressure under good control.  Continue same medication regimen.  Follow pressures.  Follow metabolic panel.   

## 2019-07-28 ENCOUNTER — Ambulatory Visit: Payer: Medicare Other | Admitting: Internal Medicine

## 2019-08-07 ENCOUNTER — Other Ambulatory Visit: Payer: Self-pay

## 2019-08-08 ENCOUNTER — Other Ambulatory Visit (INDEPENDENT_AMBULATORY_CARE_PROVIDER_SITE_OTHER): Payer: Medicare Other

## 2019-08-08 ENCOUNTER — Ambulatory Visit (INDEPENDENT_AMBULATORY_CARE_PROVIDER_SITE_OTHER): Payer: Medicare Other

## 2019-08-08 ENCOUNTER — Other Ambulatory Visit: Payer: Self-pay

## 2019-08-08 DIAGNOSIS — R945 Abnormal results of liver function studies: Secondary | ICD-10-CM | POA: Diagnosis not present

## 2019-08-08 DIAGNOSIS — R7989 Other specified abnormal findings of blood chemistry: Secondary | ICD-10-CM

## 2019-08-08 DIAGNOSIS — E538 Deficiency of other specified B group vitamins: Secondary | ICD-10-CM | POA: Diagnosis not present

## 2019-08-08 MED ORDER — CYANOCOBALAMIN 1000 MCG/ML IJ SOLN
1000.0000 ug | Freq: Once | INTRAMUSCULAR | Status: AC
  Administered 2019-08-08: 15:00:00 1000 ug via INTRAMUSCULAR

## 2019-08-08 NOTE — Progress Notes (Addendum)
Patient presented for B 12 injection to left deltoid, patient voiced no concerns nor showed any signs of distress during injection.  Reviewed.  Dr Scott 

## 2019-08-09 ENCOUNTER — Encounter: Payer: Self-pay | Admitting: Internal Medicine

## 2019-08-09 LAB — HEPATIC FUNCTION PANEL
ALT: 15 U/L (ref 0–35)
AST: 22 U/L (ref 0–37)
Albumin: 4.6 g/dL (ref 3.5–5.2)
Alkaline Phosphatase: 54 U/L (ref 39–117)
Bilirubin, Direct: 0.1 mg/dL (ref 0.0–0.3)
Total Bilirubin: 0.7 mg/dL (ref 0.2–1.2)
Total Protein: 6.7 g/dL (ref 6.0–8.3)

## 2019-08-09 LAB — VITAMIN B12: Vitamin B-12: 452 pg/mL (ref 211–911)

## 2019-08-17 DIAGNOSIS — H401131 Primary open-angle glaucoma, bilateral, mild stage: Secondary | ICD-10-CM | POA: Diagnosis not present

## 2019-09-12 ENCOUNTER — Encounter: Payer: Self-pay | Admitting: Internal Medicine

## 2019-09-13 NOTE — Telephone Encounter (Signed)
Patient will need to be screened. She is due for b12 anytime after 09/08/19.

## 2019-09-13 NOTE — Telephone Encounter (Signed)
Pt called and scheduled

## 2019-09-14 ENCOUNTER — Other Ambulatory Visit: Payer: Self-pay

## 2019-09-19 ENCOUNTER — Ambulatory Visit: Payer: Medicare Other

## 2019-09-19 ENCOUNTER — Encounter: Payer: Self-pay | Admitting: Internal Medicine

## 2019-09-20 NOTE — Telephone Encounter (Signed)
I have not heard anything about patients having to wait to receive b12 injections due to COVID vaccine. Only other vaccines. Just wanted to send this over to confirm with you before giving her a call since there has been so much confusion for her already.

## 2019-09-20 NOTE — Telephone Encounter (Signed)
Spoke to pt.  Explained no contraindication to receiving B12 injection and covid vaccine.  Also discussed her recent lab result.  She is scheduled for B12 injection 10/24/19 for f/u B12 injection.  Prefers to wait until then to come back in.  She will take oral B12 1079mcg q day until receives her next B12 injection.

## 2019-10-24 ENCOUNTER — Ambulatory Visit (INDEPENDENT_AMBULATORY_CARE_PROVIDER_SITE_OTHER): Payer: Medicare Other | Admitting: Lab

## 2019-10-24 ENCOUNTER — Other Ambulatory Visit: Payer: Self-pay

## 2019-10-24 VITALS — Temp 97.3°F

## 2019-10-24 DIAGNOSIS — E538 Deficiency of other specified B group vitamins: Secondary | ICD-10-CM

## 2019-10-24 MED ORDER — CYANOCOBALAMIN 1000 MCG/ML IJ SOLN
1000.0000 ug | Freq: Once | INTRAMUSCULAR | Status: AC
  Administered 2019-10-24: 1000 ug via INTRAMUSCULAR

## 2019-10-24 NOTE — Progress Notes (Addendum)
Pt in office today for Vit B-12 injection in R-Deltoid. Pt tolerated well.  Reviewed. Dr Nicki Reaper

## 2019-11-20 DIAGNOSIS — I480 Paroxysmal atrial fibrillation: Secondary | ICD-10-CM | POA: Diagnosis not present

## 2019-11-20 DIAGNOSIS — E782 Mixed hyperlipidemia: Secondary | ICD-10-CM | POA: Diagnosis not present

## 2019-11-20 DIAGNOSIS — I712 Thoracic aortic aneurysm, without rupture: Secondary | ICD-10-CM | POA: Diagnosis not present

## 2019-11-20 DIAGNOSIS — I10 Essential (primary) hypertension: Secondary | ICD-10-CM | POA: Diagnosis not present

## 2019-11-20 DIAGNOSIS — I2510 Atherosclerotic heart disease of native coronary artery without angina pectoris: Secondary | ICD-10-CM | POA: Diagnosis not present

## 2019-11-20 DIAGNOSIS — I25118 Atherosclerotic heart disease of native coronary artery with other forms of angina pectoris: Secondary | ICD-10-CM | POA: Diagnosis not present

## 2019-11-22 ENCOUNTER — Ambulatory Visit (INDEPENDENT_AMBULATORY_CARE_PROVIDER_SITE_OTHER): Payer: Medicare Other

## 2019-11-22 ENCOUNTER — Other Ambulatory Visit: Payer: Self-pay

## 2019-11-22 DIAGNOSIS — E538 Deficiency of other specified B group vitamins: Secondary | ICD-10-CM | POA: Diagnosis not present

## 2019-11-22 MED ORDER — CYANOCOBALAMIN 1000 MCG/ML IJ SOLN
1000.0000 ug | Freq: Once | INTRAMUSCULAR | Status: AC
  Administered 2019-11-22: 14:00:00 1000 ug via INTRAMUSCULAR

## 2019-11-22 NOTE — Progress Notes (Signed)
Patient here today for b12 injection. Administered in left deltoid. No complaints or concerns at this time.

## 2019-11-24 ENCOUNTER — Other Ambulatory Visit: Payer: Self-pay | Admitting: Internal Medicine

## 2019-12-20 ENCOUNTER — Other Ambulatory Visit: Payer: Self-pay

## 2019-12-20 ENCOUNTER — Other Ambulatory Visit: Payer: Self-pay | Admitting: Internal Medicine

## 2019-12-20 ENCOUNTER — Ambulatory Visit (INDEPENDENT_AMBULATORY_CARE_PROVIDER_SITE_OTHER): Payer: Medicare Other

## 2019-12-20 DIAGNOSIS — E538 Deficiency of other specified B group vitamins: Secondary | ICD-10-CM

## 2019-12-20 MED ORDER — CYANOCOBALAMIN 1000 MCG/ML IJ SOLN
1000.0000 ug | Freq: Once | INTRAMUSCULAR | Status: AC
  Administered 2019-12-20: 1000 ug via INTRAMUSCULAR

## 2019-12-20 NOTE — Progress Notes (Signed)
Patient presented for B 12 injection to right deltoid, patient voiced no concerns nor showed any signs of distress during injection. 

## 2019-12-25 DIAGNOSIS — I2510 Atherosclerotic heart disease of native coronary artery without angina pectoris: Secondary | ICD-10-CM | POA: Diagnosis not present

## 2019-12-25 DIAGNOSIS — I10 Essential (primary) hypertension: Secondary | ICD-10-CM | POA: Diagnosis not present

## 2019-12-25 DIAGNOSIS — E782 Mixed hyperlipidemia: Secondary | ICD-10-CM | POA: Diagnosis not present

## 2019-12-25 DIAGNOSIS — I480 Paroxysmal atrial fibrillation: Secondary | ICD-10-CM | POA: Diagnosis not present

## 2020-01-23 ENCOUNTER — Other Ambulatory Visit: Payer: Self-pay

## 2020-01-23 ENCOUNTER — Ambulatory Visit (INDEPENDENT_AMBULATORY_CARE_PROVIDER_SITE_OTHER): Payer: Medicare Other

## 2020-01-23 DIAGNOSIS — E538 Deficiency of other specified B group vitamins: Secondary | ICD-10-CM | POA: Diagnosis not present

## 2020-01-23 MED ORDER — CYANOCOBALAMIN 1000 MCG/ML IJ SOLN
1000.0000 ug | Freq: Once | INTRAMUSCULAR | Status: AC
  Administered 2020-01-23: 1000 ug via INTRAMUSCULAR

## 2020-01-23 NOTE — Progress Notes (Addendum)
Patient presented for B 12 injection to right deltoid, patient voiced no concerns nor showed any signs of distress during injection.  Reviewed.  Dr Scott 

## 2020-02-07 ENCOUNTER — Encounter: Payer: Self-pay | Admitting: Internal Medicine

## 2020-02-07 NOTE — Telephone Encounter (Signed)
Confirmed patient doing ok. Scheduled for virtual tomorrow with Dr Nicki Reaper. If worsens over night, she will go to ED or acute care.

## 2020-02-08 ENCOUNTER — Encounter: Payer: Self-pay | Admitting: Internal Medicine

## 2020-02-08 ENCOUNTER — Other Ambulatory Visit: Payer: Self-pay

## 2020-02-08 ENCOUNTER — Telehealth (INDEPENDENT_AMBULATORY_CARE_PROVIDER_SITE_OTHER): Payer: Medicare Other | Admitting: Internal Medicine

## 2020-02-08 DIAGNOSIS — Z20822 Contact with and (suspected) exposure to covid-19: Secondary | ICD-10-CM | POA: Diagnosis not present

## 2020-02-08 DIAGNOSIS — R197 Diarrhea, unspecified: Secondary | ICD-10-CM | POA: Diagnosis not present

## 2020-02-08 DIAGNOSIS — R102 Pelvic and perineal pain: Secondary | ICD-10-CM

## 2020-02-08 DIAGNOSIS — I10 Essential (primary) hypertension: Secondary | ICD-10-CM

## 2020-02-08 NOTE — Progress Notes (Signed)
Patient ID: SIOBHAIN ISLAND, female   DOB: 23-Oct-1940, 79 y.o.   MRN: [id number]   Virtual Visit via video Note  This visit type was conducted due to national recommendations for restrictions regarding the COVID-19 pandemic (e.g. social distancing).  This format is felt to be most appropriate for this patient at this time.  All issues noted in this document were discussed and addressed.  No physical exam was performed (except for noted visual exam findings with Video Visits).   I connected with Leah Huber by a video enabled telemedicine application and verified that I am speaking with the correct person using two identifiers. Location patient: home Location provider: work  Persons participating in the virtual visit: patient, provider  The limitations, risks, security and privacy concerns of performing an evaluation and management service by video and the availability of in person appointments have been discussed.  It has also been discussed with the patient that there may be a patient responsible charge related to this service. The patient has expressed understanding and has agreed to proceed.   Reason for visit: work in appt  HPI: Work in appt - concerns regarding some cough, nausea and some congestion.  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  Age grilled chicken and baked potato. There were 25-30 people present at the dinner.  She wore a mask and they were spaced out.  She is not aware of anyone else at the dinner who was sick or became ill.  After approximately 24 hours, developed vomiting and diarrhea.  Some nausea, dizziness and aching.  No fever.  Reports the diarrhea, vomiting, nausea - resolved.  She is eating soup, crackers, fruit and toast.  Had normal bowel movement yesterday.  No abdominal pain.  Some fatigue now, but overall feels better.  Some cough.  Runny nose.  Using saline.  Minimal headache.  No severe headache.  Blood pressure dong well.  She does report some  persistent lower abdominal discomfort.  Intermittent issue - occurred prior to above.     ROS: See pertinent positives and negatives per HPI.  Past Medical History:  Diagnosis Date  . Abnormal liver function test 07/23/2019  . Anemia   . Aortic aneurysm (HCC)    "SMALL"  . Arthritis    neck to tailbone  . Bilateral carpal tunnel syndrome 12/08/2017  . Bronchitis   . Chronic cough    @ NIGHT/ SEASONAL ALLERGIES  . Clostridium difficile infection 2013-2014  . Colon polyp   . Concussion   . Coronary artery disease    Dr Nehemiah Massed cardiologist  . Depression   . Diverticulosis   . GERD (gastroesophageal reflux disease)   . Glaucoma   . Gout   . Hemorrhoids   . History of chicken pox   . History of colonic polyps   . History of shingles   . Hypercholesterolemia   . Hypertension    CONTROLLED ON MEDS  . Hypovolemia 12/10   acute renal failure  . IBS (irritable bowel syndrome)   . Neuromuscular disorder ([place])    occasionally tingling in hands/ NUMBNESS IN RIGHT FOOT s/p back surgery  . Osteoarthritis    bilateral knees  . Osteopenia   . Pneumonia    HX OF  . Sleep apnea    very mild   sleep study 8 yrs ago does not use CPAP  . Urinary incontinence     Past Surgical History:  Procedure Laterality Date  . ABDOMINAL HYSTERECTOMY  [place] DECOMP/DISCECTOMY FUSION N/A 11/28/2014   Procedure: CERVICAL FIVE-SIX  ANTERIOR CERVICAL DECOMPRESSION/DISCECTOMY FUSION 1 LEVEL;  Surgeon: Newman Pies, MD;  Location: [place];  Service: Neurosurgery;  Laterality: N/A;  C56 anterior cervical decompression with fusion interbody prosthesis plating and bonegraft  . BACK SURGERY     X2/ L4,5 & S1/ Cervical C7?  . BLADDER SURGERY  1981   SUSPENSION  . BREAST REDUCTION SURGERY    . CARPAL TUNNEL RELEASE  2007  . CATARACT EXTRACTION W/PHACO Right 12/30/2016   Procedure: CATARACT EXTRACTION PHACO AND INTRAOCULAR LENS PLACEMENT (IOC)  Right toric lens;  Surgeon:  Leandrew Koyanagi, MD;  Location: [place];  Service: Ophthalmology;  Laterality: Right;  sleep apnea, does not use CPAP  . CATARACT EXTRACTION W/PHACO Left 01/20/2017   Procedure: CATARACT EXTRACTION PHACO AND INTRAOCULAR LENS PLACEMENT ([place])  Left Toric;  Surgeon: Leandrew Koyanagi, MD;  Location: [place];  Service: Ophthalmology;  Laterality: Left;  toric lens  . CHOLECYSTECTOMY  1996  . COLONOSCOPY    . COLONOSCOPY WITH PROPOFOL N/A 05/22/2019   Procedure: COLONOSCOPY WITH PROPOFOL;  Surgeon: Toledo, Benay Pike, MD;  Location: [facility];  Service: Gastroenterology;  Laterality: N/A;  . FOOT SURGERY  2005  . JOINT REPLACEMENT Bilateral    KNEE  . [place]  . REDUCTION MAMMAPLASTY Bilateral 2001  . [place] & 2004   left and right  . SPINAL CORD STIMULATOR IMPLANT  03/1997  . SPINAL CORD STIMULATOR REMOVAL  2007  . Tendon repair right shoulder    . TONSILLECTOMY    . TOTAL KNEE ARTHROPLASTY  10/2011 & 09/13   left and right    Family History  Problem Relation Age of Onset  . Heart disease Mother   . Stroke Mother   . Hypertension Mother   . Hyperlipidemia Father   . Heart disease Father   . Heart disease Brother        2 brothers  . Alzheimer's disease Brother   . Heart disease Sister        pacemaker  . Breast cancer Daughter 27    SOCIAL HX: reviewed.    Current Outpatient Medications:  .  apixaban (ELIQUIS) 5 MG TABS tablet, Take 5 mg by mouth 2 (two) times daily., Disp: , Rfl:  .  DETROL LA 4 MG 24 hr capsule, TAKE 1 CAPSULE DAILY, Disp: 90 capsule, Rfl: 3 .  latanoprost (XALATAN) 0.005 % ophthalmic solution, Place 1 drop into both eyes at bedtime. , Disp: , Rfl:  .  losartan (COZAAR) 50 MG tablet, TAKE 1 TABLET DAILY, Disp: 90 tablet, Rfl: 3 .  metoprolol succinate (TOPROL-XL) 25 MG 24 hr tablet, Take 1 tablet by mouth daily. , Disp: , Rfl:  .  metoprolol succinate (TOPROL-XL) 50 MG 24 hr tablet, Take  50 mg by mouth daily. Take with or immediately following a meal. , Disp: , Rfl:  .  pantoprazole (PROTONIX) 40 MG tablet, TAKE 1 TABLET TWICE A DAY, Disp: 180 tablet, Rfl: 3 .  rosuvastatin (CRESTOR) 20 MG tablet, TAKE 1 TABLET DAILY, Disp: 90 tablet, Rfl: 3  EXAM:  VITALS per patient if applicable: [id number], 68  GENERAL: alert, oriented, appears well and in no acute distress  HEENT: atraumatic, conjunttiva clear, no obvious abnormalities on inspection of external nose and ears  NECK: normal movements of the head and neck  LUNGS: on inspection no signs of respiratory distress,  breathing rate appears normal, no obvious gross SOB, gasping or wheezing  CV: no obvious cyanosis  PSYCH/NEURO: pleasant and cooperative, no obvious depression or anxiety, speech and thought processing grossly intact  ASSESSMENT AND PLAN:  Discussed the following assessment and plan:  Hypertension Blood pressure doing well.  Continue losartan and metoprolol.  Follow pressures.  Follow metabolic panel.   Diarrhea Diarrhea, nausea and aching as outlined.  Better now.  Feeling better. Some fatigue.  Question of viral syndrome. Discussed possibility of covid.  She has been vaccinated. Discussed covid testing. She is in agreement.  She is doing better. No diarrhea now.  Tolerating some bland foods.  Does not feel she needs anything for nausea.  Advance diet slowly.  Bowel movement normal yesterday. Rest.  Fluids.  Some cough. No sob or increased congestion.  Saline nasal spray.  Robitussin DM as directed.  Follow.    Pelvic pressure in female Persistent intermittent lower abdominal/pelvic discomfort.  Schedule pelvic ultrasound.     Orders Placed This Encounter  Procedures  . US Pelvis Complete    Standing Status:   Future    Standing Expiration Date:   02/11/2021    Order Specific Question:   Reason for Exam (SYMPTOM  OR DIAGNOSIS REQUIRED)    Answer:   persistent intermittent pelvic pressure.    Order Specific  Question:   Preferred imaging location?    Answer:   [place]     I discussed the assessment and treatment plan with the patient. The patient was provided an opportunity to ask questions and all were answered. The patient agreed with the plan and demonstrated an understanding of the instructions.   The patient was advised to call back or seek an in-person evaluation if the symptoms worsen or if the condition fails to improve as anticipated.    Einar Pheasant, MD

## 2020-02-12 ENCOUNTER — Encounter: Payer: Self-pay | Admitting: Internal Medicine

## 2020-02-12 DIAGNOSIS — R197 Diarrhea, unspecified: Secondary | ICD-10-CM | POA: Insufficient documentation

## 2020-02-12 DIAGNOSIS — R102 Pelvic and perineal pain: Secondary | ICD-10-CM | POA: Insufficient documentation

## 2020-02-12 NOTE — Assessment & Plan Note (Signed)
Persistent intermittent lower abdominal/pelvic discomfort.  Schedule pelvic ultrasound.

## 2020-02-12 NOTE — Assessment & Plan Note (Signed)
Blood pressure doing well.  Continue losartan and metoprolol.  Follow pressures.  Follow metabolic panel.

## 2020-02-12 NOTE — Assessment & Plan Note (Signed)
Diarrhea, nausea and aching as outlined.  Better now.  Feeling better. Some fatigue.  Question of viral syndrome. Discussed possibility of covid.  She has been vaccinated. Discussed covid testing. She is in agreement.  She is doing better. No diarrhea now.  Tolerating some bland foods.  Does not feel she needs anything for nausea.  Advance diet slowly.  Bowel movement normal yesterday. Rest.  Fluids.  Some cough. No sob or increased congestion.  Saline nasal spray.  Robitussin DM as directed.  Follow.

## 2020-02-20 ENCOUNTER — Telehealth: Payer: Self-pay | Admitting: Internal Medicine

## 2020-02-20 NOTE — Telephone Encounter (Signed)
Ultrasound called to let me know that they may have to add a vaginal ultrasound in order to see her ovaries. If pt is agreeable, they are going order for you to cosign. Pts appt is tomorrow. Advised you are out of the office this week.

## 2020-02-20 NOTE — Telephone Encounter (Signed)
Radiology a [facility] called needing to clarify an order Please call [phone]

## 2020-02-20 NOTE — Telephone Encounter (Signed)
Order signed.

## 2020-02-20 NOTE — Telephone Encounter (Signed)
Ultrasound called for pt [phone]

## 2020-02-20 NOTE — Telephone Encounter (Signed)
LM for Radiology to call back, need to know what needs to be clarified.

## 2020-02-21 ENCOUNTER — Other Ambulatory Visit: Payer: Self-pay

## 2020-02-21 ENCOUNTER — Ambulatory Visit
Admission: RE | Admit: 2020-02-21 | Discharge: 2020-02-21 | Disposition: A | Discharge status: Home / Self Care | Payer: Medicare Other | Source: Ambulatory Visit | Attending: Internal Medicine | Admitting: Internal Medicine

## 2020-02-21 DIAGNOSIS — R102 Pelvic and perineal pain: Secondary | ICD-10-CM | POA: Insufficient documentation

## 2020-02-22 ENCOUNTER — Telehealth: Payer: Self-pay

## 2020-02-22 NOTE — Telephone Encounter (Signed)
LMTCB about ultrasound results.

## 2020-02-27 ENCOUNTER — Ambulatory Visit (INDEPENDENT_AMBULATORY_CARE_PROVIDER_SITE_OTHER): Payer: Medicare Other

## 2020-02-27 ENCOUNTER — Other Ambulatory Visit: Payer: Self-pay

## 2020-02-27 DIAGNOSIS — E538 Deficiency of other specified B group vitamins: Secondary | ICD-10-CM

## 2020-02-27 MED ORDER — CYANOCOBALAMIN 1000 MCG/ML IJ SOLN
1000.0000 ug | Freq: Once | INTRAMUSCULAR | Status: AC
  Administered 2020-02-27: 1000 ug via INTRAMUSCULAR

## 2020-02-27 NOTE — Progress Notes (Addendum)
Patient presented for B 12 injection to left deltoid, patient voiced no concerns nor showed any signs of distress during injection.  Reviewed.  Dr Scott 

## 2020-03-28 ENCOUNTER — Ambulatory Visit (INDEPENDENT_AMBULATORY_CARE_PROVIDER_SITE_OTHER): Payer: Medicare Other

## 2020-03-28 ENCOUNTER — Other Ambulatory Visit: Payer: Self-pay

## 2020-03-28 DIAGNOSIS — E538 Deficiency of other specified B group vitamins: Secondary | ICD-10-CM | POA: Diagnosis not present

## 2020-03-28 MED ORDER — CYANOCOBALAMIN 1000 MCG/ML IJ SOLN
1000.0000 ug | Freq: Once | INTRAMUSCULAR | Status: AC
  Administered 2020-03-28: 1000 ug via INTRAMUSCULAR

## 2020-03-28 NOTE — Progress Notes (Addendum)
Leah Huber presents today for injection per MD orders. B12 injection administered IM in Left Upper Arm. Administration without incident. Patient tolerated well.  Mackay Hanauer,cma   Reviewed.  Dr Nicki Reaper

## 2020-05-02 ENCOUNTER — Ambulatory Visit (INDEPENDENT_AMBULATORY_CARE_PROVIDER_SITE_OTHER): Payer: Medicare Other

## 2020-05-02 ENCOUNTER — Other Ambulatory Visit: Payer: Self-pay

## 2020-05-02 DIAGNOSIS — E538 Deficiency of other specified B group vitamins: Secondary | ICD-10-CM

## 2020-05-02 MED ORDER — CYANOCOBALAMIN 1000 MCG/ML IJ SOLN
1000.0000 ug | Freq: Once | INTRAMUSCULAR | Status: AC
  Administered 2020-05-02: 1000 ug via INTRAMUSCULAR

## 2020-05-02 NOTE — Progress Notes (Addendum)
Patient presented for B 12 injection to Left deltoid, Patient voiced no concerns nor showed any signs of distress during injection.  Reviewed.  Dr Nicki Reaper

## 2020-05-06 ENCOUNTER — Ambulatory Visit (INDEPENDENT_AMBULATORY_CARE_PROVIDER_SITE_OTHER): Payer: Medicare Other

## 2020-05-06 VITALS — Ht 64.0 in | Wt 155.0 lb

## 2020-05-06 DIAGNOSIS — Z0000 Encounter for general adult medical examination without abnormal findings: Secondary | ICD-10-CM | POA: Diagnosis not present

## 2020-05-06 NOTE — Progress Notes (Addendum)
Subjective:   Leah Huber is a 79 y.o. female who presents for Medicare Annual (Subsequent) preventive examination.  Review of Systems    No ROS.  Medicare Wellness Virtual Visit.    Cardiac Risk Factors include: advanced age (>28men, >96 women);hypertension     Objective:    Today's Vitals   05/06/20 1104  Weight: 155 lb (70.3 kg)  Height: 5\' 4"  (1.626 m)   Body mass index is 26.61 kg/m.  Advanced Directives 05/06/2020 05/22/2019 05/04/2019 10/26/2017 03/04/2017 01/20/2017 12/30/2016  Does Patient Have a Medical Advance Directive? Yes Yes Yes Yes No Yes Yes  Type of Paramedic of [place];Living will Living will [place];Living will [place];Living will - [place];Living will [place];Living will  Does patient want to make changes to medical advance directive? No - Patient declined - No - Patient declined No - Patient declined - No - Patient declined No - Patient declined  Copy of [place] in Chart? Yes - validated most recent copy scanned in chart (See row information) - No - copy requested Yes - Yes No - copy requested  Would patient like information on creating a medical advance directive? - - - - No - Patient declined - -    Current Medications (verified) Outpatient Encounter Medications as of 05/06/2020  Medication Sig  . apixaban (ELIQUIS) 5 MG TABS tablet Take 5 mg by mouth 2 (two) times daily.  Marland Kitchen DETROL LA 4 MG 24 hr capsule TAKE 1 CAPSULE DAILY  . latanoprost (XALATAN) 0.005 % ophthalmic solution Place 1 drop into both eyes at bedtime.   Marland Kitchen losartan (COZAAR) 50 MG tablet TAKE 1 TABLET DAILY  . metoprolol succinate (TOPROL-XL) 25 MG 24 hr tablet Take 1 tablet by mouth daily.   . metoprolol succinate (TOPROL-XL) 50 MG 24 hr tablet Take 50 mg by mouth daily. Take with or immediately following a meal.   . pantoprazole (PROTONIX) 40 MG tablet TAKE 1  TABLET TWICE A DAY  . rosuvastatin (CRESTOR) 20 MG tablet TAKE 1 TABLET DAILY   No facility-administered encounter medications on file as of 05/06/2020.    Allergies (verified) Clindamycin/lincomycin, Bactrim [sulfamethoxazole-trimethoprim], Morphine, Morphine and related, and Sulfa antibiotics   History: Past Medical History:  Diagnosis Date  . Abnormal liver function test 07/23/2019  . Anemia   . Aortic aneurysm (HCC)    "SMALL"  . Arthritis    neck to tailbone  . Bilateral carpal tunnel syndrome 12/08/2017  . Bronchitis   . Chronic cough    @ NIGHT/ SEASONAL ALLERGIES  . Clostridium difficile infection 2013-2014  . Colon polyp   . Concussion   . Coronary artery disease    Dr Nehemiah Massed cardiologist  . Depression   . Diverticulosis   . GERD (gastroesophageal reflux disease)   . Glaucoma   . Gout   . Hemorrhoids   . History of chicken pox   . History of colonic polyps   . History of shingles   . Hypercholesterolemia   . Hypertension    CONTROLLED ON MEDS  . Hypovolemia 12/10   acute renal failure  . IBS (irritable bowel syndrome)   . Neuromuscular disorder ([place])    occasionally tingling in hands/ NUMBNESS IN RIGHT FOOT s/p back surgery  . Osteoarthritis    bilateral knees  . Osteopenia   . Pneumonia    HX OF  . Sleep apnea    very mild  sleep study 8 yrs ago does not use CPAP  . Urinary incontinence    Past Surgical History:  Procedure Laterality Date  . ABDOMINAL HYSTERECTOMY  1979  . ANTERIOR CERVICAL DECOMP/DISCECTOMY FUSION N/A 11/28/2014   Procedure: CERVICAL FIVE-SIX  ANTERIOR CERVICAL DECOMPRESSION/DISCECTOMY FUSION 1 LEVEL;  Surgeon: Newman Pies, MD;  Location: [facility];  Service: Neurosurgery;  Laterality: N/A;  C56 anterior cervical decompression with fusion interbody prosthesis plating and bonegraft  . BACK SURGERY     X2/ L4,5 & S1/ Cervical C7?  . BLADDER SURGERY  1981   SUSPENSION  . BREAST REDUCTION SURGERY    . CARPAL TUNNEL RELEASE   2007  . CATARACT EXTRACTION W/PHACO Right 12/30/2016   Procedure: CATARACT EXTRACTION PHACO AND INTRAOCULAR LENS PLACEMENT (IOC)  Right toric lens;  Surgeon: Leandrew Koyanagi, MD;  Location: [facility];  Service: Ophthalmology;  Laterality: Right;  sleep apnea, does not use CPAP  . CATARACT EXTRACTION W/PHACO Left 01/20/2017   Procedure: CATARACT EXTRACTION PHACO AND INTRAOCULAR LENS PLACEMENT ([place])  Left Toric;  Surgeon: Leandrew Koyanagi, MD;  Location: [facility];  Service: Ophthalmology;  Laterality: Left;  toric lens  . CHOLECYSTECTOMY  1996  . COLONOSCOPY    . COLONOSCOPY WITH PROPOFOL N/A 05/22/2019   Procedure: COLONOSCOPY WITH PROPOFOL;  Surgeon: Toledo, Benay Pike, MD;  Location: [facility];  Service: Gastroenterology;  Laterality: N/A;  . FOOT SURGERY  2005  . JOINT REPLACEMENT Bilateral    KNEE  . [place]  . REDUCTION MAMMAPLASTY Bilateral 2001  . [place] & 2004   left and right  . SPINAL CORD STIMULATOR IMPLANT  03/1997  . SPINAL CORD STIMULATOR REMOVAL  2007  . Tendon repair right shoulder    . TONSILLECTOMY    . TOTAL KNEE ARTHROPLASTY  10/2011 & 09/13   left and right   Family History  Problem Relation Age of Onset  . Heart disease Mother   . Stroke Mother   . Hypertension Mother   . Hyperlipidemia Father   . Heart disease Father   . Heart disease Brother        2 brothers  . Alzheimer's disease Brother   . Heart disease Sister        pacemaker  . Breast cancer Daughter 41   Social History   Socioeconomic History  . Marital status: Widowed    Spouse name: Not on file  . Number of children: 2  . Years of education: Not on file  . Highest education level: Not on file  Occupational History  . Not on file  Tobacco Use  . Smoking status: Former Smoker    Packs/day: 1.00    Years: 20.00    Pack years: 20.00    Types: Cigarettes    Quit date: 09/07/1988    Years since quitting: 31.6  . Smokeless  tobacco: Never Used  Vaping Use  . Vaping Use: Never used  Substance and Sexual Activity  . Alcohol use: Yes    Alcohol/week: 2.0 standard drinks    Types: 2 Glasses of wine per week    Comment: occasional  . Drug use: No  . Sexual activity: Never  Other Topics Concern  . Not on file  Social History Narrative   She is widowed, has two daughters.   Social Determinants of Health   Financial Resource Strain: Low Risk   . Difficulty of Paying Living Expenses: Not hard at all  Food  Insecurity: No Food Insecurity  . Worried About Charity fundraiser in the Last Year: Never true  . Ran Out of Food in the Last Year: Never true  Transportation Needs: No Transportation Needs  . Lack of Transportation (Medical): No  . Lack of Transportation (Non-Medical): No  Physical Activity:   . Days of Exercise per Week: Not on file  . Minutes of Exercise per Session: Not on file  Stress: No Stress Concern Present  . Feeling of Stress : Not at all  Social Connections: Unknown  . Frequency of Communication with Friends and Family: More than three times a week  . Frequency of Social Gatherings with Friends and Family: More than three times a week  . Attends Religious Services: More than 4 times per year  . Active Member of Clubs or Organizations: Yes  . Attends Archivist Meetings: More than 4 times per year  . Marital Status: Not on file    Tobacco Counseling Counseling given: Not Answered   Clinical Intake:  Pre-visit preparation completed: Yes        Diabetes: No  How often do you need to have someone help you when you read instructions, pamphlets, or other written materials from your doctor or pharmacy?: 1 - Never  Interpreter Needed?: No      Activities of Daily Living In your present state of health, do you have any difficulty performing the following activities: 05/06/2020  Hearing? N  Vision? N  Difficulty concentrating or making decisions? N  Walking or  climbing stairs? N  Dressing or bathing? N  Doing errands, shopping? N  Preparing Food and eating ? N  Using the Toilet? N  In the past six months, have you accidently leaked urine? N  Do you have problems with loss of bowel control? N  Managing your Medications? N  Managing your Finances? N  Housekeeping or managing your Housekeeping? N  Some recent data might be hidden    Patient Care Team: Einar Pheasant, MD as PCP - General (Internal Medicine)  Indicate any recent Medical Services you may have received from other than [facility] providers in the past year (date may be approximate).     Assessment:   This is a routine wellness examination for [facility].  I connected with Khristen today by telephone and verified that I am speaking with the correct person using two identifiers. Location patient: home Location provider: work Persons participating in the virtual visit: patient, Marine scientist.    I discussed the limitations, risks, security and privacy concerns of performing an evaluation and management service by telephone and the availability of in person appointments. The patient expressed understanding and verbally consented to this telephonic visit.    Interactive audio and video telecommunications were attempted between this provider and patient, however failed, due to patient having technical difficulties OR patient did not have access to video capability.  We continued and completed visit with audio only.  Some vital signs may be absent or patient reported.   Hearing/Vision screen  Hearing Screening   125Hz  250Hz  500Hz  1000Hz  2000Hz  3000Hz  4000Hz  6000Hz  8000Hz   Right ear:           Left ear:           Comments: Patient is able to hear conversational tones without difficulty.  No issues reported.  Vision Screening Comments: Visual acuity not assessed, virtual visit.  They have seen their ophthalmologist in the last 12 months.    Dietary issues and exercise  activities discussed: Current  Exercise Habits: Home exercise routine, Intensity: Mild  Healthy diet Good water intake  Goals      Patient Stated   .  Maintain Healthy Lifestyle (pt-stated)      Depression Screen PHQ 2/9 Scores 05/06/2020 05/04/2019 10/26/2017 07/27/2016 11/08/2015 11/02/2014 10/19/2013  PHQ - 2 Score 0 0 0 0 0 0 0    Fall Risk Fall Risk  05/06/2020 02/08/2020 05/04/2019 03/07/2019 10/26/2017  Falls in the past year? 0 0 0 0 Yes  Comment - - - - -  Number falls in past yr: 0 - - - 1  Comment - - - - -  Injury with Fall? - - - - Yes  Follow up Falls evaluation completed Falls evaluation completed - - Education provided  Comment - - - - Followed by PCP. Physical therapy completed on R shoulder.     Handrails in use when climbing stairs? Yes  Home free of loose throw rugs in walkways, pet beds, electrical cords, etc? Yes  Adequate lighting in your home to reduce risk of falls? Yes   ASSISTIVE DEVICES UTILIZED TO PREVENT FALLS:  Life alert? No  Use of a cane, walker or w/c? Yes  Grab bars in the bathroom? Yes  Shower chair or bench in shower? No  Elevated toilet seat or a handicapped toilet? No    TIMED UP AND GO:  Was the test performed? No . Virtual visit.    Cognitive Function: MMSE - Mini Mental State Exam 09/18/2016  Orientation to time 5  Orientation to Place 5  Registration 3  Attention/ Calculation 5  Recall 3  Language- name 2 objects 2  Language- repeat 1  Language- follow 3 step command 3  Language- read & follow direction 1  Write a sentence 1  Copy design 1  Total score 30     6CIT Screen 05/04/2019 10/26/2017  What Year? 0 points 0 points  What month? 0 points 0 points  What time? 0 points 0 points  Count back from 20 0 points 0 points  Months in reverse 0 points 0 points  Repeat phrase 0 points 0 points  Total Score 0 0    Immunizations Immunization History  Administered Date(s) Administered  . Fluad Quad(high Dose 65+) 05/30/2019  . Influenza Split 06/29/2014  .  Influenza, High Dose Seasonal PF 07/27/2016, 06/27/2018  . Influenza,inj,Quad PF,6+ Mos 06/18/2015  . Influenza-Unspecified 06/18/2015, 07/27/2016, 05/22/2017  . PFIZER SARS-COV-2 Vaccination 09/12/2019, 10/03/2019  . Pneumococcal Conjugate-13 07/30/2014  . Pneumococcal Polysaccharide-23 03/18/2017    TDAP status: Due, Education has been provided regarding the importance of this vaccine. Advised may receive this vaccine at local pharmacy or Health Dept. Aware to provide a copy of the vaccination record if obtained from local pharmacy or Health Dept. Verbalized acceptance and understanding. Deferred.    Health Maintenance Health Maintenance  Topic Date Due  . MAMMOGRAM  02/28/2020  . INFLUENZA VACCINE  08/06/2020 (Originally 04/07/2020)  . TETANUS/TDAP  05/06/2021 (Originally 12/03/1959)  . COLONOSCOPY  05/21/2024  . DEXA SCAN  Completed  . COVID-19 Vaccine  Completed  . PNA vac Low Risk Adult  Completed   Mammogram- plans to schedule.  Dental Screening: Recommended annual dental exams for proper oral hygiene. Visits every 6 months.   Community Resource Referral / Chronic Care Management: CRR required this visit?  No   CCM required this visit?  No      Plan:   Keep all routine maintenance appointments.  Follow up 06/04/20 @ 2:15.  I have personally reviewed and noted the following in the patient's chart:   . Medical and social history . Use of alcohol, tobacco or illicit drugs  . Current medications and supplements . Functional ability and status . Nutritional status . Physical activity . Advanced directives . List of other physicians . Hospitalizations, surgeries, and ER visits in previous 12 months . Vitals . Screenings to include cognitive, depression, and falls . Referrals and appointments  In addition, I have reviewed and discussed with patient certain preventive protocols, quality metrics, and best practice recommendations. A written personalized care plan for  preventive services as well as general preventive health recommendations were provided to patient via mychart.     Varney Biles, LPN   06/03/6393    Reviewed above information.  Agree with assessment and plan.   Dr Nicki Reaper

## 2020-05-06 NOTE — Patient Instructions (Addendum)
Leah Huber , Thank you for taking time to come for your Medicare Wellness Visit. I appreciate your ongoing commitment to your health goals. Please review the following plan we discussed and let me know if I can assist you in the future.   These are the goals we discussed: Goals      Patient Stated   .  Maintain Healthy Lifestyle (pt-stated)       This is a list of the screening recommended for you and due dates:  Health Maintenance  Topic Date Due  . Mammogram  02/28/2020  . Flu Shot  08/06/2020*  . Tetanus Vaccine  05/06/2021*  . Colon Cancer Screening  05/21/2024  . DEXA scan (bone density measurement)  Completed  . COVID-19 Vaccine  Completed  . Pneumonia vaccines  Completed  *Topic was postponed. The date shown is not the original due date.    Immunizations Immunization History  Administered Date(s) Administered  . Fluad Quad(high Dose 65+) 05/30/2019  . Influenza Split 06/29/2014  . Influenza, High Dose Seasonal PF 07/27/2016, 06/27/2018  . Influenza,inj,Quad PF,6+ Mos 06/18/2015  . Influenza-Unspecified 06/18/2015, 07/27/2016, 05/22/2017  . PFIZER SARS-COV-2 Vaccination 09/12/2019, 10/03/2019  . Pneumococcal Conjugate-13 07/30/2014  . Pneumococcal Polysaccharide-23 03/18/2017   Keep all routine maintenance appointments.   Follow up 06/04/20 @ 2:15  Advanced directives: yes on file  Conditions/risks identified: none new  Follow up in one year for your annual wellness visit.   Preventive Care 25 Years and Older, Female Preventive care refers to lifestyle choices and visits with your health care provider that can promote health and wellness. What does preventive care include?  A yearly physical exam. This is also called an annual well check.  Dental exams once or twice a year.  Routine eye exams. Ask your health care provider how often you should have your eyes checked.  Personal lifestyle choices, including:  Daily care of your teeth and  gums.  Regular physical activity.  Eating a healthy diet.  Avoiding tobacco and drug use.  Limiting alcohol use.  Practicing safe sex.  Taking low-dose aspirin every day.  Taking vitamin and mineral supplements as recommended by your health care provider. What happens during an annual well check? The services and screenings done by your health care provider during your annual well check will depend on your age, overall health, lifestyle risk factors, and family history of disease. Counseling  Your health care provider may ask you questions about your:  Alcohol use.  Tobacco use.  Drug use.  Emotional well-being.  Home and relationship well-being.  Sexual activity.  Eating habits.  History of falls.  Memory and ability to understand (cognition).  Work and work Statistician.  Reproductive health. Screening  You may have the following tests or measurements:  Height, weight, and BMI.  Blood pressure.  Lipid and cholesterol levels. These may be checked every 5 years, or more frequently if you are over 8 years old.  Skin check.  Lung cancer screening. You may have this screening every year starting at age 44 if you have a 30-pack-year history of smoking and currently smoke or have quit within the past 15 years.  Fecal occult blood test (FOBT) of the stool. You may have this test every year starting at age 40.  Flexible sigmoidoscopy or colonoscopy. You may have a sigmoidoscopy every 5 years or a colonoscopy every 10 years starting at age 43.  Hepatitis C blood test.  Hepatitis B blood test.  Sexually transmitted disease (STD)  testing.  Diabetes screening. This is done by checking your blood sugar (glucose) after you have not eaten for a while (fasting). You may have this done every 1-3 years.  Bone density scan. This is done to screen for osteoporosis. You may have this done starting at age 15.  Mammogram. This may be done every 1-2 years. Talk to your  health care provider about how often you should have regular mammograms. Talk with your health care provider about your test results, treatment options, and if necessary, the need for more tests. Vaccines  Your health care provider may recommend certain vaccines, such as:  Influenza vaccine. This is recommended every year.  Tetanus, diphtheria, and acellular pertussis (Tdap, Td) vaccine. You may need a Td booster every 10 years.  Zoster vaccine. You may need this after age 59.  Pneumococcal 13-valent conjugate (PCV13) vaccine. One dose is recommended after age 51.  Pneumococcal polysaccharide (PPSV23) vaccine. One dose is recommended after age 88. Talk to your health care provider about which screenings and vaccines you need and how often you need them. This information is not intended to replace advice given to you by your health care provider. Make sure you discuss any questions you have with your health care provider. Document Released: 09/20/2015 Document Revised: 05/13/2016 Document Reviewed: 06/25/2015 Elsevier Interactive Patient Education  2017 [place] Prevention in the Home Falls can cause injuries. They can happen to people of all ages. There are many things you can do to make your home safe and to help prevent falls. What can I do on the outside of my home?  Regularly fix the edges of walkways and driveways and fix any cracks.  Remove anything that might make you trip as you walk through a door, such as a raised step or threshold.  Trim any bushes or trees on the path to your home.  Use bright outdoor lighting.  Clear any walking paths of anything that might make someone trip, such as rocks or tools.  Regularly check to see if handrails are loose or broken. Make sure that both sides of any steps have handrails.  Any raised decks and porches should have guardrails on the edges.  Have any leaves, snow, or ice cleared regularly.  Use sand or salt on walking  paths during winter.  Clean up any spills in your garage right away. This includes oil or grease spills. What can I do in the bathroom?  Use night lights.  Install grab bars by the toilet and in the tub and shower. Do not use towel bars as grab bars.  Use non-skid mats or decals in the tub or shower.  If you need to sit down in the shower, use a plastic, non-slip stool.  Keep the floor dry. Clean up any water that spills on the floor as soon as it happens.  Remove soap buildup in the tub or shower regularly.  Attach bath mats securely with double-sided non-slip rug tape.  Do not have throw rugs and other things on the floor that can make you trip. What can I do in the bedroom?  Use night lights.  Make sure that you have a light by your bed that is easy to reach.  Do not use any sheets or blankets that are too big for your bed. They should not hang down onto the floor.  Have a firm chair that has side arms. You can use this for support while you get dressed.  Do not  have throw rugs and other things on the floor that can make you trip. What can I do in the kitchen?  Clean up any spills right away.  Avoid walking on wet floors.  Keep items that you use a lot in easy-to-reach places.  If you need to reach something above you, use a strong step stool that has a grab bar.  Keep electrical cords out of the way.  Do not use floor polish or wax that makes floors slippery. If you must use wax, use non-skid floor wax.  Do not have throw rugs and other things on the floor that can make you trip. What can I do with my stairs?  Do not leave any items on the stairs.  Make sure that there are handrails on both sides of the stairs and use them. Fix handrails that are broken or loose. Make sure that handrails are as long as the stairways.  Check any carpeting to make sure that it is firmly attached to the stairs. Fix any carpet that is loose or worn.  Avoid having throw rugs at  the top or bottom of the stairs. If you do have throw rugs, attach them to the floor with carpet tape.  Make sure that you have a light switch at the top of the stairs and the bottom of the stairs. If you do not have them, ask someone to add them for you. What else can I do to help prevent falls?  Wear shoes that:  Do not have high heels.  Have rubber bottoms.  Are comfortable and fit you well.  Are closed at the toe. Do not wear sandals.  If you use a stepladder:  Make sure that it is fully opened. Do not climb a closed stepladder.  Make sure that both sides of the stepladder are locked into place.  Ask someone to hold it for you, if possible.  Clearly mark and make sure that you can see:  Any grab bars or handrails.  First and last steps.  Where the edge of each step is.  Use tools that help you move around (mobility aids) if they are needed. These include:  Canes.  Walkers.  Scooters.  Crutches.  Turn on the lights when you go into a dark area. Replace any light bulbs as soon as they burn out.  Set up your furniture so you have a clear path. Avoid moving your furniture around.  If any of your floors are uneven, fix them.  If there are any pets around you, be aware of where they are.  Review your medicines with your doctor. Some medicines can make you feel dizzy. This can increase your chance of falling. Ask your doctor what other things that you can do to help prevent falls. This information is not intended to replace advice given to you by your health care provider. Make sure you discuss any questions you have with your health care provider. Document Released: 06/20/2009 Document Revised: 01/30/2016 Document Reviewed: 09/28/2014 Elsevier Interactive Patient Education  2017 [employer].

## 2020-06-04 ENCOUNTER — Encounter: Payer: Self-pay | Admitting: Internal Medicine

## 2020-06-04 ENCOUNTER — Other Ambulatory Visit: Payer: Self-pay

## 2020-06-04 ENCOUNTER — Ambulatory Visit (INDEPENDENT_AMBULATORY_CARE_PROVIDER_SITE_OTHER): Payer: Medicare Other

## 2020-06-04 DIAGNOSIS — E538 Deficiency of other specified B group vitamins: Secondary | ICD-10-CM | POA: Diagnosis not present

## 2020-06-04 DIAGNOSIS — Z23 Encounter for immunization: Secondary | ICD-10-CM

## 2020-06-04 MED ORDER — CYANOCOBALAMIN 1000 MCG/ML IJ SOLN
1000.0000 ug | Freq: Once | INTRAMUSCULAR | Status: AC
  Administered 2020-06-04: 1000 ug via INTRAMUSCULAR

## 2020-06-04 NOTE — Progress Notes (Addendum)
Patient presented for B 12 injection to right deltoid, patient voiced no concerns nor showed any signs of distress during injection.  Reviewed.  Dr Scott 

## 2020-06-10 ENCOUNTER — Other Ambulatory Visit: Payer: Self-pay | Admitting: Internal Medicine

## 2020-06-12 ENCOUNTER — Other Ambulatory Visit: Payer: Self-pay | Admitting: Internal Medicine

## 2020-06-12 DIAGNOSIS — Z1231 Encounter for screening mammogram for malignant neoplasm of breast: Secondary | ICD-10-CM

## 2020-06-13 ENCOUNTER — Ambulatory Visit (INDEPENDENT_AMBULATORY_CARE_PROVIDER_SITE_OTHER): Payer: Medicare Other | Admitting: Dermatology

## 2020-06-13 ENCOUNTER — Other Ambulatory Visit: Payer: Self-pay

## 2020-06-13 DIAGNOSIS — L308 Other specified dermatitis: Secondary | ICD-10-CM | POA: Diagnosis not present

## 2020-06-13 DIAGNOSIS — L011 Impetiginization of other dermatoses: Secondary | ICD-10-CM | POA: Diagnosis not present

## 2020-06-13 DIAGNOSIS — D692 Other nonthrombocytopenic purpura: Secondary | ICD-10-CM | POA: Diagnosis not present

## 2020-06-13 MED ORDER — TRIAMCINOLONE ACETONIDE 0.1 % EX OINT: TOPICAL_OINTMENT | CUTANEOUS | 0 refills | Status: DC

## 2020-06-13 MED ORDER — MUPIROCIN 2 % EX OINT: TOPICAL_OINTMENT | CUTANEOUS | 0 refills | Status: DC

## 2020-06-13 NOTE — Progress Notes (Signed)
   New Patient Visit  Subjective  Leah Huber is a 79 y.o. female who presents for the following: Other (red patches all over body, x a few weeks, very itchy).  She has been scratching at these frequently and this is worsened over time.  She is bruising and scabbing due to her thin skin.  She is also on blood thinners for her atrial fibrillation.  Objective  Well appearing patient in no apparent distress; mood and affect are within normal limits.  A focused examination was performed including arms, chest, legs, and back. Relevant physical exam findings are noted in the Assessment and Plan.  Objective  bilateral forearms, chest, and back: Possible eczema with impetiginization vs impetigo vs adverse reaction to blood thinner  Images          Assessment & Plan  Eczema with pruritus, severe and excoriations and purpura complicated by her blood thinner for atrial fibrillation. She has secondary impetiginization. There is a possibility her itch and rash could be related to an adverse reaction to one of her medications.  We will reevaluate on follow-up. bilateral forearms, chest, and back Start mupirocin, apply to arms, chest, right leg, and back twice a day for 3 days.  After 3 days, decrease to once a day.   In 3 days begin using Triamcinolone to same affected areas once a day along with the Mupirocin.   Discontinue using CereVe cream on involved skin.  She may continue using CeraVe cream all over her body on the nonaffected non-Rashy areas. Use mild cleansers such as Dove sensitive skin soap.  Will consider biopsy in 2 weeks at f/u if not improved.  Return in about 2 weeks (around 06/27/2020) for recheck itching.   IHarriett Sine, CMA, am acting as scribe for Sarina Ser, MD.  Documentation: I have reviewed the above documentation for accuracy and completeness, and I agree with the above.  Sarina Ser, MD

## 2020-06-13 NOTE — Patient Instructions (Addendum)
Apply Mupirocin to arms, chest, right leg, and back twice a day for 3 days.   After 3 days, decrease to once a day.   In 3 days begin using Triamcinolone to same affected areas once a day along with the Mupirocin.   Discontinue using CereVe cream.

## 2020-06-14 ENCOUNTER — Encounter: Payer: Self-pay | Admitting: Dermatology

## 2020-06-20 ENCOUNTER — Other Ambulatory Visit: Payer: Self-pay

## 2020-06-20 MED ORDER — PREDNISONE 20 MG PO TABS: ORAL_TABLET | ORAL | 0 refills | Status: DC

## 2020-06-20 NOTE — Progress Notes (Signed)
New rx per Dr. Nehemiah Massed

## 2020-06-25 ENCOUNTER — Other Ambulatory Visit: Payer: Self-pay | Admitting: Internal Medicine

## 2020-06-26 DIAGNOSIS — E782 Mixed hyperlipidemia: Secondary | ICD-10-CM | POA: Diagnosis not present

## 2020-06-26 DIAGNOSIS — I10 Essential (primary) hypertension: Secondary | ICD-10-CM | POA: Diagnosis not present

## 2020-06-26 DIAGNOSIS — I480 Paroxysmal atrial fibrillation: Secondary | ICD-10-CM | POA: Diagnosis not present

## 2020-06-26 DIAGNOSIS — I25118 Atherosclerotic heart disease of native coronary artery with other forms of angina pectoris: Secondary | ICD-10-CM | POA: Diagnosis not present

## 2020-06-26 DIAGNOSIS — I712 Thoracic aortic aneurysm, without rupture: Secondary | ICD-10-CM | POA: Diagnosis not present

## 2020-06-27 ENCOUNTER — Ambulatory Visit (INDEPENDENT_AMBULATORY_CARE_PROVIDER_SITE_OTHER): Payer: Medicare Other | Admitting: Dermatology

## 2020-06-27 ENCOUNTER — Other Ambulatory Visit: Payer: Self-pay

## 2020-06-27 DIAGNOSIS — L011 Impetiginization of other dermatoses: Secondary | ICD-10-CM | POA: Diagnosis not present

## 2020-06-27 DIAGNOSIS — L309 Dermatitis, unspecified: Secondary | ICD-10-CM

## 2020-06-27 DIAGNOSIS — D692 Other nonthrombocytopenic purpura: Secondary | ICD-10-CM | POA: Diagnosis not present

## 2020-06-27 NOTE — Progress Notes (Signed)
   Follow-Up Visit   Subjective  Leah Huber is a 79 y.o. female who presents for the following: Eczema with pruritis (forearms, back, chest, mupirocin oint qd, TMC 0.1% oint qd, prednisone 20mg  taper, pt has a few days left).  The following portions of the chart were reviewed this encounter and updated as appropriate:  Tobacco  Allergies  Meds  Problems  Med Hx  Surg Hx  Fam Hx     Review of Systems:  No other skin or systemic complaints except as noted in HPI or Assessment and Plan.  Objective  Well appearing patient in no apparent distress; mood and affect are within normal limits.  A focused examination was performed including arms, legs, back. Relevant physical exam findings are noted in the Assessment and Plan.  Objective  forearms, chest, back, R lower leg: Excoriations bil forearms, R lower leg   Assessment & Plan  Eczema / Atopic Dermatitis forearms, chest, back, R lower leg Eczema with pruritus, severe and excoriations and purpura complicated by her blood thinner for atrial fibrillation. With secondary impetiginization  Improving but persistent  Finish Prednisone 20mg  1 po qod Cont Mupirocin oint qd to aa rash Cont TMC 0.1% oint qd to aa rash, avoid f/g/a Restart Allegra 180mg  1 po qam  Will plan bx on f/u if pt flares again  Return in about 2 weeks (around 07/11/2020) for 2-3 wks f/u.   I, Othelia Pulling, RMA, am acting as scribe for Sarina Ser, MD .  Documentation: I have reviewed the above documentation for accuracy and completeness, and I agree with the above.  Sarina Ser, MD

## 2020-07-01 ENCOUNTER — Encounter: Payer: Self-pay | Admitting: Dermatology

## 2020-07-03 ENCOUNTER — Other Ambulatory Visit: Payer: Self-pay | Admitting: Internal Medicine

## 2020-07-09 ENCOUNTER — Ambulatory Visit (INDEPENDENT_AMBULATORY_CARE_PROVIDER_SITE_OTHER): Payer: Medicare Other

## 2020-07-09 ENCOUNTER — Other Ambulatory Visit: Payer: Self-pay

## 2020-07-09 DIAGNOSIS — E538 Deficiency of other specified B group vitamins: Secondary | ICD-10-CM

## 2020-07-09 MED ORDER — CYANOCOBALAMIN 1000 MCG/ML IJ SOLN
1000.0000 ug | Freq: Once | INTRAMUSCULAR | Status: AC
  Administered 2020-07-09: 1000 ug via INTRAMUSCULAR

## 2020-07-09 NOTE — Progress Notes (Addendum)
Patient presented for B 12 injection to right deltoid, patient voiced no concerns nor showed any signs of distress during injection.  Reviewed.  Dr Scott 

## 2020-07-10 ENCOUNTER — Ambulatory Visit
Admission: RE | Admit: 2020-07-10 | Discharge: 2020-07-10 | Disposition: A | Discharge status: Home / Self Care | Payer: Medicare Other | Source: Ambulatory Visit | Attending: Internal Medicine | Admitting: Internal Medicine

## 2020-07-10 DIAGNOSIS — Z1231 Encounter for screening mammogram for malignant neoplasm of breast: Secondary | ICD-10-CM | POA: Diagnosis not present

## 2020-07-17 ENCOUNTER — Ambulatory Visit (INDEPENDENT_AMBULATORY_CARE_PROVIDER_SITE_OTHER): Payer: Medicare Other | Admitting: Dermatology

## 2020-07-17 ENCOUNTER — Other Ambulatory Visit: Payer: Self-pay

## 2020-07-17 DIAGNOSIS — R21 Rash and other nonspecific skin eruption: Secondary | ICD-10-CM | POA: Diagnosis not present

## 2020-07-17 DIAGNOSIS — L209 Atopic dermatitis, unspecified: Secondary | ICD-10-CM

## 2020-07-17 DIAGNOSIS — L0100 Impetigo, unspecified: Secondary | ICD-10-CM

## 2020-07-17 MED ORDER — EUCRISA 2 % EX OINT: 1.0000 "application " | TOPICAL_OINTMENT | Freq: Every day | CUTANEOUS | 3 refills | Status: DC

## 2020-07-17 MED ORDER — MUPIROCIN 2 % EX OINT: 1.0000 "application " | TOPICAL_OINTMENT | Freq: Every day | CUTANEOUS | 1 refills | Status: DC

## 2020-07-17 MED ORDER — TRIAMCINOLONE ACETONIDE 0.1 % EX CREA: 1.0000 "application " | TOPICAL_CREAM | CUTANEOUS | 1 refills | Status: DC

## 2020-07-17 NOTE — Patient Instructions (Addendum)
Triamcinolone 0.1% cream to areas of rash Monday, Wednesday and Friday. Mupirocin ointment to any scabbed areas.   Start Eucrisa ointment daily to areas of rash.

## 2020-07-17 NOTE — Progress Notes (Signed)
   Follow-Up Visit   Subjective  Leah Huber is a 79 y.o. female who presents for the following: Eczema (/atopic dermatits follow up - finished prednisone, TMC and Mupirocin. She has less itching and is not getting new spots.).  The following portions of the chart were reviewed this encounter and updated as appropriate:  Tobacco  Allergies  Meds  Problems  Med Hx  Surg Hx  Fam Hx     Review of Systems:  No other skin or systemic complaints except as noted in HPI or Assessment and Plan.  Objective  Well appearing patient in no apparent distress; mood and affect are within normal limits.  A focused examination was performed including face, arms,legs, chest, back. Relevant physical exam findings are noted in the Assessment and Plan.  Objective  arms, chest, back, right pretibial: Chest, mid to low back are clear.  Excoriations of arms and right pretibial.   Assessment & Plan  Rash -atopic dermatitis with pruritus; crusting and secondary impetigo.  Chronic and persistent.  Not to goal but improved. arms, chest, back, right pretibial Scattered crusted excoriations persistent today.Marland Kitchen  Purpura is resolved.  Minimal pink patches.  Overall improved  Atopic dermatitis (eczema) is a chronic, relapsing, pruritic condition that can significantly affect quality of life. It is often associated with allergic rhinitis and/or asthma and can require treatment with topical medications, phototherapy, or in severe cases a biologic medication called Dupixent.   Continue Mupirocin ointment to any scabbed areas.  Decrease TMC 0.1% ointment qd Monday, Wednesday and Friday.  Start Eucrisa ointment qd to areas of rash. (If [place] not covered by insurance may substitute tacrolimus)  May consider Dupixent in the future if does not continue to improve.Stasia Cavalier (EUCRISA) 2 % OINT - arms, chest, back, right pretibial  triamcinolone cream (KENALOG) 0.1 % - arms, chest, back, right  pretibial  mupirocin ointment (BACTROBAN) 2 % - arms, chest, back, right pretibial  Return for Follow up in 3-4 weeks.  I, Ashok Cordia, CMA, am acting as scribe for Sarina Ser, MD .  Documentation: I have reviewed the above documentation for accuracy and completeness, and I agree with the above.  Sarina Ser, MD

## 2020-07-18 ENCOUNTER — Encounter: Payer: Self-pay | Admitting: Dermatology

## 2020-07-18 ENCOUNTER — Telehealth: Payer: Self-pay

## 2020-07-18 MED ORDER — TACROLIMUS 0.1 % EX OINT: TOPICAL_OINTMENT | Freq: Every day | CUTANEOUS | 11 refills | Status: DC

## 2020-07-18 NOTE — Telephone Encounter (Signed)
Send Tacrolimus with instructions same as note says for Eucrisa  disp 100 grams With 11 refills

## 2020-07-18 NOTE — Telephone Encounter (Signed)
Patient advised of medication change.

## 2020-07-18 NOTE — Telephone Encounter (Signed)
Eucrisa not covered. Patient can not purchase this as a "cash" pay option due to having Medicare insurance. Per the pharmacy insurance will covere Betamethasone Diorop., Desoximetasone, Fluocinonide, Triamcinolone, Tacrolimus, Pimecrolimus.

## 2020-07-18 NOTE — Telephone Encounter (Signed)
Tacrolimus sent in and left patient message to return my call.

## 2020-08-13 ENCOUNTER — Ambulatory Visit (INDEPENDENT_AMBULATORY_CARE_PROVIDER_SITE_OTHER): Payer: Medicare Other

## 2020-08-13 ENCOUNTER — Other Ambulatory Visit: Payer: Self-pay

## 2020-08-13 DIAGNOSIS — E538 Deficiency of other specified B group vitamins: Secondary | ICD-10-CM

## 2020-08-13 MED ORDER — CYANOCOBALAMIN 1000 MCG/ML IJ SOLN
1000.0000 ug | Freq: Once | INTRAMUSCULAR | Status: AC
  Administered 2020-08-13: 1000 ug via INTRAMUSCULAR

## 2020-08-13 NOTE — Progress Notes (Addendum)
Patient presented for B 12 injection to left deltoid, patient voiced no concerns nor showed any signs of distress during injection.  Reviewed.  Dr Scott 

## 2020-08-14 ENCOUNTER — Ambulatory Visit (INDEPENDENT_AMBULATORY_CARE_PROVIDER_SITE_OTHER): Payer: Medicare Other | Admitting: Dermatology

## 2020-08-14 DIAGNOSIS — D485 Neoplasm of uncertain behavior of skin: Secondary | ICD-10-CM | POA: Diagnosis not present

## 2020-08-14 DIAGNOSIS — R21 Rash and other nonspecific skin eruption: Secondary | ICD-10-CM | POA: Diagnosis not present

## 2020-08-14 DIAGNOSIS — L859 Epidermal thickening, unspecified: Secondary | ICD-10-CM | POA: Diagnosis not present

## 2020-08-14 MED ORDER — DUPILUMAB 300 MG/2ML ~~LOC~~ SOSY
600.0000 mg | PREFILLED_SYRINGE | Freq: Once | SUBCUTANEOUS | Status: AC
  Administered 2020-08-14: 600 mg via SUBCUTANEOUS

## 2020-08-14 NOTE — Progress Notes (Signed)
   Follow-Up Visit   Subjective  Leah Huber is a 79 y.o. female who presents for the following: No chief complaint on file..    The following portions of the chart were reviewed this encounter and updated as appropriate:   Tobacco  Allergies  Meds  Problems  Med Hx  Surg Hx  Fam Hx     Review of Systems:  No other skin or systemic complaints except as noted in HPI or Assessment and Plan.  Objective  Well appearing patient in no apparent distress; mood and affect are within normal limits.  All skin waist up examined.  Objective  Right Upper Back: Pink scaly patches with crusts and excoriations  Objective  Trunk and extremities: Pink scaly patches with crusts and excoriations   Assessment & Plan    Neoplasm of uncertain behavior of skin Right Upper Back  Skin / nail biopsy Type of biopsy: punch   Informed consent: discussed and consent obtained   Timeout: patient name, date of birth, surgical site, and procedure verified   Procedure prep:  Patient was prepped and draped in usual sterile fashion (the patient was cleaned and prepped) Prep type:  Isopropyl alcohol Anesthesia: the lesion was anesthetized in a standard fashion   Anesthetic:  1% lidocaine w/ epinephrine 1-100,000 buffered w/ 8.4% NaHCO3 Punch size:  3.5 mm Suture size:  4-0 Suture type: nylon   Hemostasis achieved with: suture, pressure and aluminum chloride   Outcome: patient tolerated procedure well   Post-procedure details: sterile dressing applied and wound care instructions given   Dressing type: bandage, petrolatum and pressure dressing    Specimen 1 - Surgical pathology Differential Diagnosis: Atopic Neurodermatitis R/O Drug Reaction Check Margins: No Pink scaly patches with crusts and excoriations  Rash Trunk and extremities  Rash -atopic dermatitis with pruritus R/O Drug Reaction; crusting and secondary impetigo.  Chronic and persistent.  Not to goal but improved.  Continue  Tacrolimus oint bid, TMC 0.1% oint bid to more severe areas.  Continue Mupirocin oint to scabbed areas.  Start Dupixent injections today - samples given  triamcinolone cream (KENALOG) 0.1 % - Trunk and extremities  mupirocin ointment (BACTROBAN) 2 % - Trunk and extremities  dupilumab (DUPIXENT) prefilled syringe 600 mg - Trunk and extremities  Return in about 1 week (around 08/21/2020) for Biopsy Follow up.  I, Ashok Cordia, CMA, am acting as scribe for Sarina Ser, MD .  Documentation: I have reviewed the above documentation for accuracy and completeness, and I agree with the above.  Sarina Ser, MD

## 2020-08-14 NOTE — Patient Instructions (Signed)

## 2020-08-19 DIAGNOSIS — I25118 Atherosclerotic heart disease of native coronary artery with other forms of angina pectoris: Secondary | ICD-10-CM | POA: Diagnosis not present

## 2020-08-19 DIAGNOSIS — I480 Paroxysmal atrial fibrillation: Secondary | ICD-10-CM | POA: Diagnosis not present

## 2020-08-20 ENCOUNTER — Encounter: Payer: Self-pay | Admitting: Dermatology

## 2020-08-21 ENCOUNTER — Other Ambulatory Visit: Payer: Self-pay

## 2020-08-21 ENCOUNTER — Ambulatory Visit (INDEPENDENT_AMBULATORY_CARE_PROVIDER_SITE_OTHER): Payer: Medicare Other | Admitting: Dermatology

## 2020-08-21 DIAGNOSIS — T7840XA Allergy, unspecified, initial encounter: Secondary | ICD-10-CM

## 2020-08-21 DIAGNOSIS — D485 Neoplasm of uncertain behavior of skin: Secondary | ICD-10-CM

## 2020-08-21 DIAGNOSIS — L239 Allergic contact dermatitis, unspecified cause: Secondary | ICD-10-CM | POA: Diagnosis not present

## 2020-08-21 DIAGNOSIS — L2081 Atopic neurodermatitis: Secondary | ICD-10-CM | POA: Diagnosis not present

## 2020-08-21 DIAGNOSIS — L309 Dermatitis, unspecified: Secondary | ICD-10-CM | POA: Diagnosis not present

## 2020-08-21 DIAGNOSIS — D492 Neoplasm of unspecified behavior of bone, soft tissue, and skin: Secondary | ICD-10-CM

## 2020-08-21 NOTE — Patient Instructions (Signed)

## 2020-08-21 NOTE — Progress Notes (Addendum)
Follow-Up Visit   Subjective  Leah Huber is a 79 y.o. female who presents for the following: EPIDERMAL HYPERPLASIA WITH FIBROSIS, (Bx proven, pt presents for suture removal) and Dermatitis (1 wk f/u, trunk, extremities, Tacrolimus, TMC 0.1% cr and mupirocin oint, Dupixent 1 dose). Pt is still itching and scratching despite Dupixent loading dose 1 weeks ago.  The following portions of the chart were reviewed this encounter and updated as appropriate:   Tobacco  Allergies  Meds  Problems  Med Hx  Surg Hx  Fam Hx     Review of Systems:  No other skin or systemic complaints except as noted in HPI or Assessment and Plan.  Objective  Well appearing patient in no apparent distress; mood and affect are within normal limits.  A focused examination was performed including back, arms, legs. Relevant physical exam findings are noted in the Assessment and Plan.  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  Not yet responding to Dupixent loading dose 1 week ago trunk, extremities Cont TMC 0.1% cr qd/bid aa body avoid f/g/a Cont Tacrolimus 0.1% ont qd/bid Will cont Dupixent for now, pt due for next injection next week  Atopic dermatitis (eczema) is a chronic, relapsing, pruritic condition that can significantly affect quality of life. It is often associated with allergic rhinitis and/or asthma and can require treatment with topical medications, phototherapy, or in severe cases a biologic medication called Dupixent in older children and adults.   Hypersensitivity reaction, - biopsy proven Right Upper Back Bx proven,  unknown cause.   Reviewed could be due to medication reaction (pt on no new meds for over 1 year) vs bite reaction (not clinically consistent with bite reaction); vs Contact dermatitis vs  Alpha Gal hypersensitivity vs early Bullous disease vs idiopathic vs Covid vaccine related?  Pt has been on Eliquis and metoprolol for > 1 year Starting itching ~04/2020 or before  Discussed True Patch test 36, applied today. Will check Alpha Gal labs  Dr Laurence Ferrari evaluated pt with me today.  Cont TMC 0.1% cr qd/bid aa body avoid f/g/a Cont Tacrolimus 0.1% ont qd/bid Will cont Dupixent for now, pt due for next injection next week If idiopathic hypersensitivity, may consider oral Methotrexate.  Wound cleansed, sutures removed, wound cleansed and steri strips applied. Discussed pathology results.   Patch Test - Right Upper Back  Other Related Procedures Alpha Gal IgE  Neoplasm of skin (2) - rash DIF & H&E biopsies  R post shoulder superior  Skin / nail biopsy Type of biopsy: punch   Informed consent: discussed and consent obtained   Timeout: patient name, date of birth, surgical site, and procedure verified   Procedure prep:  Patient was prepped and draped in usual sterile fashion (The patient was cleaned and prepped) Prep type:  Isopropyl alcohol Anesthesia: the lesion was anesthetized in a standard fashion   Anesthetic:  1% lidocaine w/ epinephrine 1-100,000 buffered w/ 8.4% NaHCO3 Punch size:  3 mm Suture size:  4-0 Suture type: nylon   Hemostasis achieved with: suture, pressure and aluminum chloride   Outcome: patient tolerated procedure well  Post-procedure details: sterile dressing applied and wound care instructions given   Dressing type: bandage, pressure dressing and petrolatum    Specimen 1 - Surgical pathology Differential Diagnosis: D48.5. Hyerpsensitivity Reaction r/o Bullous pemphigoid Check Margins: no Pink scaly patches with crusts and excoriations Michels solution  R post  shoudler inferior  Skin / nail biopsy Type of biopsy: punch   Informed consent: discussed and consent obtained   Timeout: patient name, date of birth, surgical site, and procedure verified   Procedure prep:  Patient was prepped and draped in usual sterile fashion (The patient was cleaned and prepped) Prep type:  Isopropyl alcohol Anesthesia: the lesion was anesthetized in a standard fashion   Anesthetic:  1% lidocaine w/ epinephrine 1-100,000 buffered w/ 8.4% NaHCO3 Punch size:  3 mm Suture size:  4-0 Suture type: nylon   Hemostasis achieved with: suture, pressure and aluminum chloride   Outcome: patient tolerated procedure well   Post-procedure details: sterile dressing applied and wound care instructions given   Dressing type: bandage, pressure dressing and petrolatum    Specimen 2 - Surgical pathology Differential Diagnosis: D48.5 Hypersensitivity reaction r/o Bullous Pemphigoid Check Margins: No Pink scaly patches with crusts and excoriations H&E  Hyerpsensitivity Reaction r/o Bullous pemphigoid  Return in about 1 week (around 08/28/2020) for sr, patch test f/u.   I, Othelia Pulling, RMA, am acting as scribe for Sarina Ser, MD .  Documentation: I have reviewed the above documentation for accuracy and completeness, and I agree with the above.  Sarina Ser, MD

## 2020-08-22 ENCOUNTER — Other Ambulatory Visit: Payer: Self-pay | Admitting: Dermatology

## 2020-08-22 DIAGNOSIS — I10 Essential (primary) hypertension: Secondary | ICD-10-CM | POA: Diagnosis not present

## 2020-08-22 DIAGNOSIS — E782 Mixed hyperlipidemia: Secondary | ICD-10-CM | POA: Diagnosis not present

## 2020-08-22 DIAGNOSIS — I712 Thoracic aortic aneurysm, without rupture: Secondary | ICD-10-CM | POA: Diagnosis not present

## 2020-08-22 DIAGNOSIS — I2510 Atherosclerotic heart disease of native coronary artery without angina pectoris: Secondary | ICD-10-CM | POA: Diagnosis not present

## 2020-08-22 DIAGNOSIS — I480 Paroxysmal atrial fibrillation: Secondary | ICD-10-CM | POA: Diagnosis not present

## 2020-08-22 DIAGNOSIS — I6523 Occlusion and stenosis of bilateral carotid arteries: Secondary | ICD-10-CM | POA: Diagnosis not present

## 2020-08-22 DIAGNOSIS — T7840XA Allergy, unspecified, initial encounter: Secondary | ICD-10-CM | POA: Diagnosis not present

## 2020-08-26 ENCOUNTER — Encounter: Payer: Self-pay | Admitting: Dermatology

## 2020-08-26 LAB — ALPHA-GAL PANEL
Allergen Lamb IgE: <0.1 kU/L
Beef IgE: <0.1 kU/L
IgE (Immunoglobulin E), Serum: 3 IU/mL — ABNORMAL LOW (ref 6–495)
O215-IgE Alpha-Gal: <0.1 kU/L
Pork IgE: <0.1 kU/L

## 2020-08-27 DIAGNOSIS — Z5181 Encounter for therapeutic drug level monitoring: Secondary | ICD-10-CM | POA: Diagnosis not present

## 2020-08-27 DIAGNOSIS — I480 Paroxysmal atrial fibrillation: Secondary | ICD-10-CM | POA: Diagnosis not present

## 2020-08-27 DIAGNOSIS — Z79899 Other long term (current) drug therapy: Secondary | ICD-10-CM | POA: Diagnosis not present

## 2020-08-28 ENCOUNTER — Ambulatory Visit (INDEPENDENT_AMBULATORY_CARE_PROVIDER_SITE_OTHER): Payer: Medicare Other | Admitting: Dermatology

## 2020-08-28 ENCOUNTER — Other Ambulatory Visit: Payer: Self-pay

## 2020-08-28 DIAGNOSIS — L2081 Atopic neurodermatitis: Secondary | ICD-10-CM | POA: Diagnosis not present

## 2020-08-28 DIAGNOSIS — Z20822 Contact with and (suspected) exposure to covid-19: Secondary | ICD-10-CM | POA: Diagnosis not present

## 2020-08-28 MED ORDER — MUPIROCIN 2 % EX OINT: 1.0000 "application " | TOPICAL_OINTMENT | Freq: Every day | CUTANEOUS | 4 refills | Status: DC

## 2020-08-28 MED ORDER — TRIAMCINOLONE ACETONIDE 0.1 % EX OINT: 1.0000 "application " | TOPICAL_OINTMENT | CUTANEOUS | 1 refills | Status: DC

## 2020-08-28 MED ORDER — DUPILUMAB 300 MG/2ML ~~LOC~~ SOSY
300.0000 mg | PREFILLED_SYRINGE | Freq: Once | SUBCUTANEOUS | Status: AC
  Administered 2020-08-28: 12:00:00 300 mg via SUBCUTANEOUS

## 2020-08-28 NOTE — Progress Notes (Signed)
   Follow-Up Visit   Subjective  Leah Huber is a 78 y.o. female who presents for the following: atopic derm vs other bx f/u (Bx f/u, True Patch test 36 f/u, Dupixent injection today, pt currently using TMC 0.1% oint, Tacrolimus 0.1% oint, Mupirocin oint).  The following portions of the chart were reviewed this encounter and updated as appropriate:   Tobacco  Allergies  Meds  Problems  Med Hx  Surg Hx  Fam Hx     Review of Systems:  No other skin or systemic complaints except as noted in HPI or Assessment and Plan.  Objective  Well appearing patient in no apparent distress; mood and affect are within normal limits.  A focused examination was performed including arms, back. Relevant physical exam findings are noted in the Assessment and Plan.  Objective  arms, trunk, legs: Pink scaly patches with crusts and excoriations   Assessment & Plan  Atopic neurodermatitis Pt still itching and scratching. arms, trunk, legs  Biopsy shows: Spongiotic Derm With Eczema/ Atopic changes. Spoke to Dr. Deborah Chalk - she compared 2 pathologies from 08/14/20 and 08/21/20 and said both were similar with eczema changes with associated excoriation/ Lichen simplex changes. DIF was negative, so NOT consistent with primary blistering disease  Alpha gal negative - see lab report.  Wound cleansed, sutures removed, wound cleansed and steri strips applied. Discussed pathology results.   True patch test 36 reading today and questionable reactions at: Carba mix and Black rubber   information given to pt Avoid these chemicals / products  Cont Dupixent 300mg /30ml sq injections, sq injection into R upper arm This is 2nd Dupixent injection.  Pt on Dupixent 2 weeks. Cont TMC 0.1% oint qd/bid up to 5d/wk aa rash, avoid f/g/a Cont Tacrolimus 0.1% oint qd/bid aa rash on body Cont Mupirocin oint qd to open sores.  If not improving on follow up in 2 weeks,  may consider Dr. Laurence Ferrari starting pt on  MTX  There may be element of Stress; anxiety contributing to pts condition.  Atopic dermatitis (eczema) is a chronic, relapsing, pruritic condition that can significantly affect quality of life. It is often associated with allergic rhinitis and/or asthma and can require treatment with topical medications, phototherapy, or in severe cases a biologic medication called Dupixent in older children and adults.   triamcinolone ointment (KENALOG) 0.1 % - arms, trunk, legs  mupirocin ointment (BACTROBAN) 2 % - arms, trunk, legs  dupilumab (DUPIXENT) prefilled syringe 300 mg - arms, trunk, legs  Return in about 2 weeks (around 09/11/2020) for Atopic Derm.   I, Othelia Pulling, RMA, am acting as scribe for Sarina Ser, MD .  Documentation: I have reviewed the above documentation for accuracy and completeness, and I agree with the above.  Sarina Ser, MD

## 2020-09-05 ENCOUNTER — Encounter: Payer: Self-pay | Admitting: Dermatology

## 2020-09-11 ENCOUNTER — Other Ambulatory Visit: Payer: Self-pay

## 2020-09-11 ENCOUNTER — Ambulatory Visit (INDEPENDENT_AMBULATORY_CARE_PROVIDER_SITE_OTHER): Payer: Medicare Other | Admitting: Dermatology

## 2020-09-11 DIAGNOSIS — L2081 Atopic neurodermatitis: Secondary | ICD-10-CM

## 2020-09-11 MED ORDER — DUPILUMAB 300 MG/2ML ~~LOC~~ SOSY
300.0000 mg | PREFILLED_SYRINGE | Freq: Once | SUBCUTANEOUS | Status: AC
  Administered 2020-09-11: 300 mg via SUBCUTANEOUS

## 2020-09-11 MED ORDER — DUPIXENT 300 MG/2ML ~~LOC~~ SOSY: 300.0000 mg | PREFILLED_SYRINGE | SUBCUTANEOUS | 11 refills | Status: DC

## 2020-09-11 NOTE — Progress Notes (Signed)
   Follow-Up Visit   Subjective  Leah Huber is a 80 y.o. female who presents for the following: AD (2wk f/u Dupixent, TMC 0.1% oint, Tacrolimus 0.1% oint, Mupirocin oint, itching, maybe some improvement).  The following portions of the chart were reviewed this encounter and updated as appropriate:   Tobacco  Allergies  Meds  Problems  Med Hx  Surg Hx  Fam Hx     Review of Systems:  No other skin or systemic complaints except as noted in HPI or Assessment and Plan.  Objective  Well appearing patient in no apparent distress; mood and affect are within normal limits.  A full examination was performed including scalp, head, eyes, ears, nose, lips, neck, chest, axillae, abdomen, back, buttocks, bilateral upper extremities, bilateral lower extremities, hands, feet, fingers, toes, fingernails, and toenails. All findings within normal limits unless otherwise noted below.  Objective  arms, legs, trunk: Crusting and pink patches and purpura arms, legs, trunk   Assessment & Plan  Atopic neurodermatitis, severe with pruritus and excoriations arms, legs, trunk  Bx proven Improving Atopic dermatitis - Severe, on Dupixent (biologic medication).  Atopic dermatitis (eczema) is a chronic, relapsing, pruritic condition that can significantly affect quality of life. It is often associated with allergic rhinitis and/or asthma and can require treatment with topical medications, phototherapy, or in severe cases a biologic medication called Dupixent.     Dupixent 300mg /60ml sq injection to R ant thigh Lot [id number] 09/2022 This is 3rd Dupixent injection.  Pt on Dupixent 1 month.  Cont Dupixent 300mg /40ml sq injections q2wks Cont TMC 0.1% oint qd/bid up to 5d/wk aa rash, avoid f/g/a Cont Tacrolimus 0.1% oint qd/bid aa rash on body Cont Mupirocin oint qd to open sores. Restart Allegra 180mg  qam  Discussed if pt does not continue to improve will consider MTX, NBUVB  dupilumab (DUPIXENT)  prefilled syringe 300 mg - arms, legs, trunk  dupilumab (DUPIXENT) 300 MG/2ML prefilled syringe - arms, legs, trunk  Other Related Medications triamcinolone ointment (KENALOG) 0.1 % mupirocin ointment (BACTROBAN) 2 %  Return in about 2 weeks (around 09/25/2020) for Dupixent.  I, 3m, RMA, am acting as scribe for , MD .  Documentation: I have reviewed the above documentation for accuracy and completeness, and I agree with the above.  09/27/2020, MD

## 2020-09-12 ENCOUNTER — Encounter: Payer: Self-pay | Admitting: Dermatology

## 2020-09-16 DIAGNOSIS — H401131 Primary open-angle glaucoma, bilateral, mild stage: Secondary | ICD-10-CM | POA: Diagnosis not present

## 2020-09-17 ENCOUNTER — Other Ambulatory Visit: Payer: Self-pay

## 2020-09-17 ENCOUNTER — Ambulatory Visit (INDEPENDENT_AMBULATORY_CARE_PROVIDER_SITE_OTHER): Payer: Medicare Other

## 2020-09-17 DIAGNOSIS — E538 Deficiency of other specified B group vitamins: Secondary | ICD-10-CM

## 2020-09-17 MED ORDER — CYANOCOBALAMIN 1000 MCG/ML IJ SOLN
1000.0000 ug | Freq: Once | INTRAMUSCULAR | Status: AC
  Administered 2020-09-17: 1000 ug via INTRAMUSCULAR

## 2020-09-17 NOTE — Progress Notes (Signed)
Patient presented for B 12 injection to right deltoid, patient voiced no concerns nor showed any signs of distress during injection. 

## 2020-09-21 ENCOUNTER — Encounter: Payer: Self-pay | Admitting: Internal Medicine

## 2020-09-21 DIAGNOSIS — D649 Anemia, unspecified: Secondary | ICD-10-CM

## 2020-09-21 DIAGNOSIS — I10 Essential (primary) hypertension: Secondary | ICD-10-CM

## 2020-09-21 DIAGNOSIS — E538 Deficiency of other specified B group vitamins: Secondary | ICD-10-CM

## 2020-09-21 DIAGNOSIS — E7800 Pure hypercholesterolemia, unspecified: Secondary | ICD-10-CM

## 2020-09-23 DIAGNOSIS — E538 Deficiency of other specified B group vitamins: Secondary | ICD-10-CM | POA: Insufficient documentation

## 2020-09-23 NOTE — Telephone Encounter (Signed)
Orders placed for f/u labs.  

## 2020-09-23 NOTE — Telephone Encounter (Signed)
Please schedule f/u fasting lab appt.  Also needs a f/u appt with me scheduled.

## 2020-09-25 ENCOUNTER — Other Ambulatory Visit: Payer: Self-pay

## 2020-09-25 ENCOUNTER — Ambulatory Visit (INDEPENDENT_AMBULATORY_CARE_PROVIDER_SITE_OTHER): Payer: Medicare Other | Admitting: Dermatology

## 2020-09-25 ENCOUNTER — Ambulatory Visit (INDEPENDENT_AMBULATORY_CARE_PROVIDER_SITE_OTHER): Payer: Medicare Other

## 2020-09-25 DIAGNOSIS — L2081 Atopic neurodermatitis: Secondary | ICD-10-CM

## 2020-09-25 DIAGNOSIS — L209 Atopic dermatitis, unspecified: Secondary | ICD-10-CM

## 2020-09-25 MED ORDER — DUPILUMAB 300 MG/2ML ~~LOC~~ SOSY
300.0000 mg | PREFILLED_SYRINGE | Freq: Once | SUBCUTANEOUS | Status: AC
  Administered 2020-09-25: 300 mg via SUBCUTANEOUS

## 2020-09-25 NOTE — Telephone Encounter (Signed)
Called and scheduled patient

## 2020-09-25 NOTE — Progress Notes (Signed)
   Follow-Up Visit   Subjective  Leah Huber is a 80 y.o. female who presents for the following: Follow-up (Atopic Dermatitis follow up - 2 weeks. Improving on Dupixent. Itching is less now.).  The following portions of the chart were reviewed this encounter and updated as appropriate:   Tobacco  Allergies  Meds  Problems  Med Hx  Surg Hx  Fam Hx     Review of Systems:  No other skin or systemic complaints except as noted in HPI or Assessment and Plan.  Objective  Well appearing patient in no apparent distress; mood and affect are within normal limits.  A focused examination was performed including arms, back, legs. Relevant physical exam findings are noted in the Assessment and Plan.  Objective  Right Upper Arm - Anterior: Crusts and pink patches of back, legs and arms.   Assessment & Plan  Atopic neurodermatitis Right Upper Arm - Anterior Atopic neurodermatitis, severe with pruritus and excoriations  Seems to be improving on Dupixent injections.  Discussed NBUVB light treatments 2 times per week. Continue Dupixent injections every 2 weeks.  Atopic dermatitis - Severe, on Dupixent (biologic medication).  Atopic dermatitis (eczema) is a chronic, relapsing, pruritic condition that can significantly affect quality of life. It is often associated with allergic rhinitis and/or asthma and can require treatment with topical medications, phototherapy, or in severe cases a biologic medication called Dupixent.    Dupixent 300mg /22ml sq injection to left ant thigh Lot [id number] 09/2022 This is 4th Dupixent injection.     Cont Dupixent 300mg /102ml sq injections q2wks Cont TMC 0.1% oint qd/bid up to 5d/wk aa rash, avoid f/g/a Cont Tacrolimus 0.1% oint qd/bid aa rash on body Cont Mupirocin oint qd to open sores. Restart Allegra 180mg  qam   dupilumab (DUPIXENT) prefilled syringe 300 mg - Right Upper Arm - Anterior  Other Related Medications triamcinolone ointment (KENALOG) 0.1  % mupirocin ointment (BACTROBAN) 2 % dupilumab (DUPIXENT) 300 MG/2ML prefilled syringe  Return in about 2 weeks (around 10/09/2020).  I, Ashok Cordia, CMA, am acting as scribe for Sarina Ser, MD .  Documentation: I have reviewed the above documentation for accuracy and completeness, and I agree with the above.  Sarina Ser, MD

## 2020-09-26 DIAGNOSIS — I712 Thoracic aortic aneurysm, without rupture: Secondary | ICD-10-CM | POA: Diagnosis not present

## 2020-09-26 DIAGNOSIS — I10 Essential (primary) hypertension: Secondary | ICD-10-CM | POA: Diagnosis not present

## 2020-09-26 DIAGNOSIS — I6523 Occlusion and stenosis of bilateral carotid arteries: Secondary | ICD-10-CM | POA: Diagnosis not present

## 2020-09-26 DIAGNOSIS — I25118 Atherosclerotic heart disease of native coronary artery with other forms of angina pectoris: Secondary | ICD-10-CM | POA: Diagnosis not present

## 2020-09-26 DIAGNOSIS — E782 Mixed hyperlipidemia: Secondary | ICD-10-CM | POA: Diagnosis not present

## 2020-09-26 DIAGNOSIS — I480 Paroxysmal atrial fibrillation: Secondary | ICD-10-CM | POA: Diagnosis not present

## 2020-09-29 ENCOUNTER — Encounter: Payer: Self-pay | Admitting: Dermatology

## 2020-09-30 NOTE — Progress Notes (Signed)
Patient treated in the lightbox/NBUVB for  52 seconds.

## 2020-10-01 ENCOUNTER — Other Ambulatory Visit: Payer: Self-pay

## 2020-10-01 ENCOUNTER — Ambulatory Visit (INDEPENDENT_AMBULATORY_CARE_PROVIDER_SITE_OTHER): Payer: Medicare Other

## 2020-10-01 DIAGNOSIS — H04123 Dry eye syndrome of bilateral lacrimal glands: Secondary | ICD-10-CM | POA: Diagnosis not present

## 2020-10-01 DIAGNOSIS — L209 Atopic dermatitis, unspecified: Secondary | ICD-10-CM | POA: Diagnosis not present

## 2020-10-01 DIAGNOSIS — H401131 Primary open-angle glaucoma, bilateral, mild stage: Secondary | ICD-10-CM | POA: Diagnosis not present

## 2020-10-01 NOTE — Progress Notes (Signed)
Patient treated in the lightbox/NBUVB for 1 minute and 2 seconds.

## 2020-10-03 ENCOUNTER — Ambulatory Visit (INDEPENDENT_AMBULATORY_CARE_PROVIDER_SITE_OTHER): Payer: Medicare Other

## 2020-10-03 ENCOUNTER — Other Ambulatory Visit (INDEPENDENT_AMBULATORY_CARE_PROVIDER_SITE_OTHER): Payer: Medicare Other

## 2020-10-03 ENCOUNTER — Other Ambulatory Visit: Payer: Self-pay

## 2020-10-03 DIAGNOSIS — E538 Deficiency of other specified B group vitamins: Secondary | ICD-10-CM | POA: Diagnosis not present

## 2020-10-03 DIAGNOSIS — L209 Atopic dermatitis, unspecified: Secondary | ICD-10-CM | POA: Diagnosis not present

## 2020-10-03 DIAGNOSIS — I10 Essential (primary) hypertension: Secondary | ICD-10-CM

## 2020-10-03 DIAGNOSIS — E7800 Pure hypercholesterolemia, unspecified: Secondary | ICD-10-CM | POA: Diagnosis not present

## 2020-10-03 DIAGNOSIS — D649 Anemia, unspecified: Secondary | ICD-10-CM | POA: Diagnosis not present

## 2020-10-03 LAB — CBC WITH DIFFERENTIAL/PLATELET
Basophils Absolute: 0 10*3/uL (ref 0.0–0.1)
Basophils Relative: 0.7 % (ref 0.0–3.0)
Eosinophils Absolute: 0.2 10*3/uL (ref 0.0–0.7)
Eosinophils Relative: 2.3 % (ref 0.0–5.0)
HCT: 34.6 % — ABNORMAL LOW (ref 36.0–46.0)
Hemoglobin: 11.2 g/dL — ABNORMAL LOW (ref 12.0–15.0)
Lymphocytes Relative: 36.5 % (ref 12.0–46.0)
Lymphs Abs: 2.4 10*3/uL (ref 0.7–4.0)
MCHC: 32.4 g/dL (ref 30.0–36.0)
MCV: 104 fl — ABNORMAL HIGH (ref 78.0–100.0)
Monocytes Absolute: 0.5 10*3/uL (ref 0.1–1.0)
Monocytes Relative: 8 % (ref 3.0–12.0)
Neutro Abs: 3.5 10*3/uL (ref 1.4–7.7)
Neutrophils Relative %: 52.5 % (ref 43.0–77.0)
Platelets: 284 10*3/uL (ref 150.0–400.0)
RBC: 3.32 Mil/uL — ABNORMAL LOW (ref 3.87–5.11)
RDW: 14.1 % (ref 11.5–15.5)
WBC: 6.7 10*3/uL (ref 4.0–10.5)

## 2020-10-03 LAB — VITAMIN B12: Vitamin B-12: 496 pg/mL (ref 211–911)

## 2020-10-03 LAB — IBC + FERRITIN
Ferritin: 40.8 ng/mL (ref 10.0–291.0)
Iron: 91 ug/dL (ref 42–145)
Saturation Ratios: 20.8 % (ref 20.0–50.0)
Transferrin: 313 mg/dL (ref 212.0–360.0)

## 2020-10-03 LAB — BASIC METABOLIC PANEL
BUN: 30 mg/dL — ABNORMAL HIGH (ref 6–23)
CO2: 27 mEq/L (ref 19–32)
Calcium: 9.8 mg/dL (ref 8.4–10.5)
Chloride: 105 mEq/L (ref 96–112)
Creatinine, Ser: 1.39 mg/dL — ABNORMAL HIGH (ref 0.40–1.20)
GFR: 35.76 mL/min — ABNORMAL LOW (ref >60.00)
Glucose, Bld: 76 mg/dL (ref 70–99)
Potassium: 4.5 mEq/L (ref 3.5–5.1)
Sodium: 140 mEq/L (ref 135–145)

## 2020-10-03 LAB — HEPATIC FUNCTION PANEL
ALT: 11 U/L (ref 0–35)
AST: 21 U/L (ref 0–37)
Albumin: 4.4 g/dL (ref 3.5–5.2)
Alkaline Phosphatase: 48 U/L (ref 39–117)
Bilirubin, Direct: 0.1 mg/dL (ref 0.0–0.3)
Total Bilirubin: 0.5 mg/dL (ref 0.2–1.2)
Total Protein: 6.6 g/dL (ref 6.0–8.3)

## 2020-10-03 LAB — TSH: TSH: 2.92 u[IU]/mL (ref 0.35–4.50)

## 2020-10-03 LAB — LIPID PANEL
Cholesterol: 242 mg/dL — ABNORMAL HIGH (ref 0–200)
HDL: 88.3 mg/dL (ref >39.00)
LDL Cholesterol: 129 mg/dL — ABNORMAL HIGH (ref 0–99)
NonHDL: 153.36
Total CHOL/HDL Ratio: 3
Triglycerides: 120 mg/dL (ref 0.0–149.0)
VLDL: 24 mg/dL (ref 0.0–40.0)

## 2020-10-03 NOTE — Progress Notes (Signed)
Patient treated in the lightbox/NBUVB for 1 minutes and 12 seconds.

## 2020-10-07 ENCOUNTER — Encounter: Payer: Self-pay | Admitting: Internal Medicine

## 2020-10-07 ENCOUNTER — Ambulatory Visit (INDEPENDENT_AMBULATORY_CARE_PROVIDER_SITE_OTHER): Payer: Medicare Other

## 2020-10-07 ENCOUNTER — Other Ambulatory Visit: Payer: Self-pay

## 2020-10-07 ENCOUNTER — Ambulatory Visit (INDEPENDENT_AMBULATORY_CARE_PROVIDER_SITE_OTHER): Payer: Medicare Other | Admitting: Internal Medicine

## 2020-10-07 VITALS — BP 122/70 | HR 91 | Temp 97.4°F | Resp 16 | Ht 64.0 in | Wt 153.2 lb

## 2020-10-07 DIAGNOSIS — L209 Atopic dermatitis, unspecified: Secondary | ICD-10-CM | POA: Diagnosis not present

## 2020-10-07 DIAGNOSIS — I712 Thoracic aortic aneurysm, without rupture, unspecified: Secondary | ICD-10-CM

## 2020-10-07 DIAGNOSIS — R944 Abnormal results of kidney function studies: Secondary | ICD-10-CM

## 2020-10-07 DIAGNOSIS — I4891 Unspecified atrial fibrillation: Secondary | ICD-10-CM | POA: Diagnosis not present

## 2020-10-07 DIAGNOSIS — D649 Anemia, unspecified: Secondary | ICD-10-CM | POA: Diagnosis not present

## 2020-10-07 DIAGNOSIS — I10 Essential (primary) hypertension: Secondary | ICD-10-CM | POA: Diagnosis not present

## 2020-10-07 DIAGNOSIS — R945 Abnormal results of liver function studies: Secondary | ICD-10-CM | POA: Diagnosis not present

## 2020-10-07 DIAGNOSIS — K219 Gastro-esophageal reflux disease without esophagitis: Secondary | ICD-10-CM | POA: Diagnosis not present

## 2020-10-07 DIAGNOSIS — E7800 Pure hypercholesterolemia, unspecified: Secondary | ICD-10-CM | POA: Diagnosis not present

## 2020-10-07 DIAGNOSIS — E538 Deficiency of other specified B group vitamins: Secondary | ICD-10-CM | POA: Diagnosis not present

## 2020-10-07 DIAGNOSIS — N183 Chronic kidney disease, stage 3 unspecified: Secondary | ICD-10-CM | POA: Insufficient documentation

## 2020-10-07 DIAGNOSIS — R7989 Other specified abnormal findings of blood chemistry: Secondary | ICD-10-CM

## 2020-10-07 LAB — CBC WITH DIFFERENTIAL/PLATELET
Basophils Absolute: 0.1 10*3/uL (ref 0.0–0.1)
Basophils Relative: 1.2 % (ref 0.0–3.0)
Eosinophils Absolute: 0.1 10*3/uL (ref 0.0–0.7)
Eosinophils Relative: 1.9 % (ref 0.0–5.0)
HCT: 40.1 % (ref 36.0–46.0)
Hemoglobin: 13 g/dL (ref 12.0–15.0)
Lymphocytes Relative: 34.4 % (ref 12.0–46.0)
Lymphs Abs: 2.2 10*3/uL (ref 0.7–4.0)
MCHC: 32.5 g/dL (ref 30.0–36.0)
MCV: 103.3 fl — ABNORMAL HIGH (ref 78.0–100.0)
Monocytes Absolute: 0.6 10*3/uL (ref 0.1–1.0)
Monocytes Relative: 9.4 % (ref 3.0–12.0)
Neutro Abs: 3.4 10*3/uL (ref 1.4–7.7)
Neutrophils Relative %: 53.1 % (ref 43.0–77.0)
Platelets: 334 10*3/uL (ref 150.0–400.0)
RBC: 3.88 Mil/uL (ref 3.87–5.11)
RDW: 14 % (ref 11.5–15.5)
WBC: 6.3 10*3/uL (ref 4.0–10.5)

## 2020-10-07 LAB — URINALYSIS, ROUTINE W REFLEX MICROSCOPIC
Hgb urine dipstick: NEGATIVE
Ketones, ur: NEGATIVE
Nitrite: NEGATIVE
Specific Gravity, Urine: >=1.03 — AB (ref 1.000–1.030)
Total Protein, Urine: NEGATIVE
Urine Glucose: NEGATIVE
Urobilinogen, UA: 0.2 (ref 0.0–1.0)
pH: 5 (ref 5.0–8.0)

## 2020-10-07 LAB — BASIC METABOLIC PANEL
BUN: 30 mg/dL — ABNORMAL HIGH (ref 6–23)
CO2: 25 mEq/L (ref 19–32)
Calcium: 10.1 mg/dL (ref 8.4–10.5)
Chloride: 103 mEq/L (ref 96–112)
Creatinine, Ser: 1.45 mg/dL — ABNORMAL HIGH (ref 0.40–1.20)
GFR: 33.99 mL/min — ABNORMAL LOW (ref >60.00)
Glucose, Bld: 93 mg/dL (ref 70–99)
Potassium: 4.7 mEq/L (ref 3.5–5.1)
Sodium: 138 mEq/L (ref 135–145)

## 2020-10-07 NOTE — Progress Notes (Signed)
Patient treated in the lightbox/NBUVB for 1 minute and 22 seconds.

## 2020-10-07 NOTE — Progress Notes (Signed)
Patient ID: Leah Huber, female   DOB: [birth year], 80 y.o.   MRN: [id number]   Subjective:    Patient ID: Leah Huber, female    DOB: 20-Jun-1941, 80 y.o.   MRN: [id number]  HPI This visit occurred during the SARS-CoV-2 public health emergency.  Safety protocols were in place, including screening questions prior to the visit, additional usage of staff PPE, and extensive cleaning of exam room while observing appropriate contact time as indicated for disinfecting solutions.  Patient here for a scheduled follow up.  Follow up regarding her blood pressure, cholesterol, afib.  Having problems with increased rash recently.  Seeing dermatology.  Using TCC and bactroban.  Is better.  Having light therapy 2x/week.  Seeing Dr Nehemiah Massed for f/u of her afib.  On multaq now.  Just started one week ago.  Still notices intermittent fluttering.  Is better.  No chest pain or sob reported.  No nausea or vomiting.  Bowels moving.  Discussed labs.  Decreased hgb.  Denies blood in stool.  Decreased GFR.  Discussed.  Staying hydrated.    Past Medical History:  Diagnosis Date  . Abnormal liver function test 07/23/2019  . Anemia   . Aortic aneurysm ([facility])    "SMALL"  . Arthritis    neck to tailbone  . Bilateral carpal tunnel syndrome 12/08/2017  . Bronchitis   . Chronic cough    @ NIGHT/ SEASONAL ALLERGIES  . Clostridium difficile infection 2013-2014  . Colon polyp   . Concussion   . Coronary artery disease    Dr Nehemiah Massed cardiologist  . Depression   . Diverticulosis   . GERD (gastroesophageal reflux disease)   . Glaucoma   . Gout   . Hemorrhoids   . History of chicken pox   . History of colonic polyps   . History of shingles   . Hypercholesterolemia   . Hypertension    CONTROLLED ON MEDS  . Hypovolemia 12/10   acute renal failure  . IBS (irritable bowel syndrome)   . Neuromuscular disorder ([place])    occasionally tingling in hands/ NUMBNESS IN RIGHT FOOT s/p back surgery  . Osteoarthritis     bilateral knees  . Osteopenia   . Pneumonia    HX OF  . Sleep apnea    very mild   sleep study 8 yrs ago does not use CPAP  . Urinary incontinence    Past Surgical History:  Procedure Laterality Date  . ABDOMINAL HYSTERECTOMY  1979  . ANTERIOR CERVICAL DECOMP/DISCECTOMY FUSION N/A 11/28/2014   Procedure: CERVICAL FIVE-SIX  ANTERIOR CERVICAL DECOMPRESSION/DISCECTOMY FUSION 1 LEVEL;  Surgeon: Newman Pies, MD;  Location: [facility];  Service: Neurosurgery;  Laterality: N/A;  C56 anterior cervical decompression with fusion interbody prosthesis plating and bonegraft  . BACK SURGERY     X2/ L4,5 & S1/ Cervical C7?  . BLADDER SURGERY  1981   SUSPENSION  . BREAST REDUCTION SURGERY    . CARPAL TUNNEL RELEASE  2007  . CATARACT EXTRACTION W/PHACO Right 12/30/2016   Procedure: CATARACT EXTRACTION PHACO AND INTRAOCULAR LENS PLACEMENT ([facility])  Right toric lens;  Surgeon: Leandrew Koyanagi, MD;  Location: [facility];  Service: Ophthalmology;  Laterality: Right;  sleep apnea, does not use CPAP  . CATARACT EXTRACTION W/PHACO Left 01/20/2017   Procedure: CATARACT EXTRACTION PHACO AND INTRAOCULAR LENS PLACEMENT ([place])  Left Toric;  Surgeon: Leandrew Koyanagi, MD;  Location: [facility];  Service: Ophthalmology;  Laterality: Left;  toric lens  . CHOLECYSTECTOMY  1996  . COLONOSCOPY    . COLONOSCOPY WITH PROPOFOL N/A 05/22/2019   Procedure: COLONOSCOPY WITH PROPOFOL;  Surgeon: Tol[place], Benay Pike, MD;  Location: [facility];  Service: Gastroenterology;  Laterality: N/A;  . FOOT SURGERY  2005  . JOINT REPLACEMENT Bilateral    KNEE  . [place]  . REDUCTION MAMMAPLASTY Bilateral 2001  . [place] & 2004   left and right  . SPINAL CORD STIMULATOR IMPLANT  03/1997  . SPINAL CORD STIMULATOR REMOVAL  2007  . Tendon repair right shoulder    . TONSILLECTOMY    . TOTAL KNEE ARTHROPLASTY  10/2011 & 09/13   left and right   Family History  Problem  Relation Age of Onset  . Heart disease Mother   . Stroke Mother   . Hypertension Mother   . Hyperlipidemia Father   . Heart disease Father   . Heart disease Brother        2 brothers  . Alzheimer's disease Brother   . Heart disease Sister        pacemaker  . Breast cancer Daughter 20   Social History   Socioeconomic History  . Marital status: Widowed    Spouse name: Not on file  . Number of children: 2  . Years of education: Not on file  . Highest education level: Not on file  Occupational History  . Not on file  Tobacco Use  . Smoking status: Former Smoker    Packs/day: 1.00    Years: 20.00    Pack years: 20.00    Types: Cigarettes    Quit date: 09/07/1988    Years since quitting: 32.1  . Smokeless tobacco: Never Used  Vaping Use  . Vaping Use: Never used  Substance and Sexual Activity  . Alcohol use: Yes    Alcohol/week: 2.0 standard drinks    Types: 2 Glasses of wine per week    Comment: occasional  . Drug use: No  . Sexual activity: Never  Other Topics Concern  . Not on file  Social History Narrative   She is widowed, has two daughters.   Social Determinants of Health   Financial Resource Strain: Low Risk   . Difficulty of Paying Living Expenses: Not hard at all  Food Insecurity: No Food Insecurity  . Worried About Charity fundraiser in the Last Year: Never true  . Ran Out of Food in the Last Year: Never true  Transportation Needs: No Transportation Needs  . Lack of Transportation (Medical): No  . Lack of Transportation (Non-Medical): No  Physical Activity: Not on file  Stress: No Stress Concern Present  . Feeling of Stress : Not at all  Social Connections: Unknown  . Frequency of Communication with Friends and Family: More than three times a week  . Frequency of Social Gatherings with Friends and Family: More than three times a week  . Attends Religious Services: More than 4 times per year  . Active Member of Clubs or Organizations: Yes  . Attends  Archivist Meetings: More than 4 times per year  . Marital Status: Not on file    Outpatient Encounter Medications as of 10/07/2020  Medication Sig  . dronedarone (MULTAQ) 400 MG tablet Take 400 mg by mouth 2 (two) times daily.  Marland Kitchen apixaban (ELIQUIS) 5 MG TABS tablet Take 5 mg by mouth 2 (two) times daily.  Marland Kitchen DETROL LA 4 MG 24 hr capsule TAKE 1 CAPSULE  DAILY  . dupilumab (DUPIXENT) 300 MG/2ML prefilled syringe Inject 300 mg into the skin every 14 (fourteen) days. Starting at day 15 for maintenance.  . latanoprost (XALATAN) 0.005 % ophthalmic solution Place 1 drop into both eyes at bedtime.   Marland Kitchen losartan (COZAAR) 50 MG tablet TAKE 1 TABLET DAILY  . metoprolol succinate (TOPROL-XL) 25 MG 24 hr tablet Take 1 tablet by mouth daily.   . metoprolol succinate (TOPROL-XL) 50 MG 24 hr tablet Take 50 mg by mouth daily. Take with or immediately following a meal.   . mupirocin ointment (BACTROBAN) 2 % Apply to affected areas twice a day for 3 days, then once a day along with Triamcinolone.  . pantoprazole (PROTONIX) 40 MG tablet TAKE 1 TABLET TWICE A DAY  . tacrolimus (PROTOPIC) 0.1 % ointment Apply topically daily.  Marland Kitchen triamcinolone ointment (KENALOG) 0.1 % Apply once a day to affected areas along with Mupirocin.  . [DISCONTINUED] mupirocin ointment (BACTROBAN) 2 % Apply 1 application topically daily. To any scabbed areas  . [DISCONTINUED] mupirocin ointment (BACTROBAN) 2 % Apply 1 application topically daily. Bid to open sores on body  . [DISCONTINUED] predniSONE (DELTASONE) 20 MG tablet Take 2 tablets at one time on day one, then 1 tablet once a day for 3 days, then 1 tablet every other day until finished  . [DISCONTINUED] triamcinolone cream (KENALOG) 0.1 % Apply 1 application topically as directed. Monday, Wednesday and Friday. Avoid face, groin, underarms.  . [DISCONTINUED] triamcinolone ointment (KENALOG) 0.1 % Apply 1 application topically as directed. Qd to bid up to 5 days a week aa rash on  arm, legs and trunk prn flares, avoid f/g/a   No facility-administered encounter medications on file as of 10/07/2020.    Review of Systems  Constitutional: Negative for appetite change and unexpected weight change.  HENT: Negative for congestion and sinus pressure.   Respiratory: Negative for cough, chest tightness and shortness of breath.   Cardiovascular: Negative for chest pain and leg swelling.       Noticing occasional heart fluttering/palpitations.   Gastrointestinal: Negative for abdominal pain, diarrhea, nausea and vomiting.  Genitourinary: Negative for difficulty urinating and dysuria.  Musculoskeletal: Negative for joint swelling and myalgias.  Skin: Positive for rash. Negative for color change.  Neurological: Negative for dizziness, light-headedness and headaches.  Psychiatric/Behavioral: Negative for agitation and dysphoric mood.       Objective:    Physical Exam Vitals reviewed.  Constitutional:      General: She is not in acute distress.    Appearance: Normal appearance.  HENT:     Head: Normocephalic and atraumatic.     Right Ear: External ear normal.     Left Ear: External ear normal.     Mouth/Throat:     Mouth: Oropharynx is clear and moist.  Eyes:     General: No scleral icterus.       Right eye: No discharge.        Left eye: No discharge.     Conjunctiva/sclera: Conjunctivae normal.  Neck:     Thyroid: No thyromegaly.  Cardiovascular:     Rate and Rhythm: Normal rate and regular rhythm.  Pulmonary:     Effort: No respiratory distress.     Breath sounds: Normal breath sounds. No wheezing.  Abdominal:     General: Bowel sounds are normal.     Palpations: Abdomen is soft.     Tenderness: There is no abdominal tenderness.  Musculoskeletal:  General: No swelling, tenderness or edema.     Cervical back: Neck supple. No tenderness.  Lymphadenopathy:     Cervical: No cervical adenopathy.  Skin:    Findings: No erythema or rash.  Neurological:      Mental Status: She is alert.  Psychiatric:        Mood and Affect: Mood normal.        Behavior: Behavior normal.     BP 122/70   Pulse 91   Temp (!) 97.4 F (36.3 C) (Oral)   Resp 16   Ht 5\' 4"  (1.626 m)   Wt 153 lb 3.2 oz (69.5 kg)   LMP 08/15/1978   SpO2 99%   BMI 26.30 kg/m  Wt Readings from Last 3 Encounters:  10/07/20 153 lb 3.2 oz (69.5 kg)  05/06/20 155 lb (70.3 kg)  02/08/20 155 lb (70.3 kg)     Lab Results  Component Value Date   WBC 6.3 10/07/2020   HGB 13.0 10/07/2020   HCT 40.1 10/07/2020   PLT 334.0 10/07/2020   GLUCOSE 93 10/07/2020   CHOL 242 (H) 10/03/2020   TRIG 120.0 10/03/2020   HDL 88.30 10/03/2020   LDLDIRECT 147.1 05/30/2013   LDLCALC 129 (H) 10/03/2020   ALT 11 10/03/2020   AST 21 10/03/2020   NA 138 10/07/2020   K 4.7 10/07/2020   CL 103 10/07/2020   CREATININE 1.45 (H) 10/07/2020   BUN 30 (H) 10/07/2020   CO2 25 10/07/2020   TSH 2.92 10/03/2020   INR 1.0 10/16/2013   HGBA1C 5.5 07/18/2019    MM 3D SCREEN BREAST BILATERAL  Result Date: 07/11/2020 CLINICAL DATA:  Screening. EXAM: DIGITAL SCREENING BILATERAL MAMMOGRAM WITH TOMO AND CAD COMPARISON:  Previous exam(s). ACR Breast Density Category b: There are scattered areas of fibroglandular density. FINDINGS: There are no findings suspicious for malignancy. Images were processed with CAD. IMPRESSION: No mammographic evidence of malignancy. A result letter of this screening mammogram will be mailed directly to the patient. RECOMMENDATION: Screening mammogram in one year. (Code:SM-B-01Y) BI-RADS CATEGORY  1: Negative. Electronically Signed   By: Lajean Manes M.D.   On: 07/11/2020 16:26       Assessment & Plan:   Problem List Items Addressed This Visit    A-fib [facility]) - Primary    Followed by cardiology.  On eliquis.  Just started multaq.  Has helped some.  Follow.       Relevant Medications   dronedarone (MULTAQ) 400 MG tablet   Abnormal liver function test    Liver function  tests just checked and wnl.  Follow.        Anemia    Slight decrease hgb on recent lab.  Recheck cbc today.       Relevant Orders   CBC with Differential/Platelet (Completed)   Aortic aneurysm ([place])    Has been followed by cardiology.  Reports stable.       Relevant Medications   dronedarone (MULTAQ) 400 MG tablet   B12 deficiency    B12 level wnl.  Will stop B12 injections.  Start oral B12 1071mcg q day.       Decreased GFR    Decreased GFR.  Not taking antiinflammatories.  Started multaq one week ago.  Recheck metabolic panel.  Stay hydrated.  Check urinalysis.  If persistent decrease, check renal ultrasound.       Relevant Orders   Basic metabolic panel (Completed)   Urinalysis, Routine w reflex microscopic (Completed)   US  Renal   GERD (gastroesophageal reflux disease)    No upper symptoms reported.  On protonix.        Hypercholesterolemia    Was on crestor.  Cardiology advised to stop.  Confirmed with cardiology.  She is to remain off for now.  Low cholesterol diet and exercise.  Follow lipid panel and liver function tests.        Relevant Medications   dronedarone (MULTAQ) 400 MG tablet   Hypertension    Blood pressure is doing well. On losartan and metoprolol.  Follow pressures.  Follow metabolic panel.       Relevant Medications   dronedarone (MULTAQ) 400 MG tablet       Einar Pheasant, MD

## 2020-10-07 NOTE — Patient Instructions (Signed)
Start oral B12 1062mcg - take one per day.

## 2020-10-08 ENCOUNTER — Other Ambulatory Visit: Payer: Self-pay | Admitting: Internal Medicine

## 2020-10-08 DIAGNOSIS — R7989 Other specified abnormal findings of blood chemistry: Secondary | ICD-10-CM

## 2020-10-08 NOTE — Progress Notes (Signed)
Order placed for f/u lab.   

## 2020-10-09 ENCOUNTER — Ambulatory Visit (INDEPENDENT_AMBULATORY_CARE_PROVIDER_SITE_OTHER): Payer: Medicare Other

## 2020-10-09 ENCOUNTER — Other Ambulatory Visit: Payer: Self-pay

## 2020-10-09 ENCOUNTER — Ambulatory Visit: Payer: Medicare Other

## 2020-10-09 DIAGNOSIS — L209 Atopic dermatitis, unspecified: Secondary | ICD-10-CM

## 2020-10-09 MED ORDER — DUPILUMAB 300 MG/2ML ~~LOC~~ SOSY
300.0000 mg | PREFILLED_SYRINGE | Freq: Once | SUBCUTANEOUS | Status: AC
  Administered 2020-10-09: 300 mg via SUBCUTANEOUS

## 2020-10-09 NOTE — Progress Notes (Signed)
Patient here for two week Dupixent injection.   Dupixent 300mg /48mL injected into patient's right arm. Patient tolerated well.  LOT: [id number] EXP: 09/08/2019  Patient also here for phototherapy/lightbox treatment today. Patient treated for 1 minute and 32 seconds.

## 2020-10-13 ENCOUNTER — Encounter: Payer: Self-pay | Admitting: Internal Medicine

## 2020-10-13 NOTE — Assessment & Plan Note (Signed)
B12 level wnl.  Will stop B12 injections.  Start oral B12 1052mcg q day.

## 2020-10-13 NOTE — Assessment & Plan Note (Signed)
Liver function tests just checked and wnl.  Follow.   

## 2020-10-13 NOTE — Assessment & Plan Note (Signed)
Has been followed by cardiology.  Reports stable.

## 2020-10-13 NOTE — Assessment & Plan Note (Addendum)
Was on crestor.  Cardiology advised to stop.  Confirmed with cardiology.  She is to remain off for now.  Low cholesterol diet and exercise.  Follow lipid panel and liver function tests.

## 2020-10-13 NOTE — Assessment & Plan Note (Signed)
Decreased GFR.  Not taking antiinflammatories.  Started multaq one week ago.  Recheck metabolic panel.  Stay hydrated.  Check urinalysis.  If persistent decrease, check renal ultrasound.

## 2020-10-13 NOTE — Assessment & Plan Note (Signed)
Blood pressure is doing well. On losartan and metoprolol.  Follow pressures.  Follow metabolic panel.

## 2020-10-13 NOTE — Assessment & Plan Note (Signed)
Followed by cardiology.  On eliquis.  Just started multaq.  Has helped some.  Follow.

## 2020-10-13 NOTE — Assessment & Plan Note (Addendum)
Slight decrease hgb on recent lab.  Recheck cbc today.

## 2020-10-13 NOTE — Assessment & Plan Note (Signed)
No upper symptoms reported.  On protonix.   

## 2020-10-14 ENCOUNTER — Telehealth: Payer: Self-pay | Admitting: Internal Medicine

## 2020-10-14 ENCOUNTER — Ambulatory Visit: Payer: Medicare Other

## 2020-10-14 NOTE — Telephone Encounter (Signed)
lft vm for pt to call ofc to sch US renal. 

## 2020-10-16 ENCOUNTER — Encounter: Payer: Self-pay | Admitting: Internal Medicine

## 2020-10-16 ENCOUNTER — Other Ambulatory Visit: Payer: Self-pay | Admitting: Internal Medicine

## 2020-10-16 ENCOUNTER — Ambulatory Visit: Payer: Medicare Other

## 2020-10-16 DIAGNOSIS — R944 Abnormal results of kidney function studies: Secondary | ICD-10-CM

## 2020-10-16 DIAGNOSIS — M545 Low back pain, unspecified: Secondary | ICD-10-CM

## 2020-10-16 NOTE — Progress Notes (Signed)
Order placed for f/u labs.  

## 2020-10-16 NOTE — Telephone Encounter (Signed)
Please call pt and confirm what symptoms having now.  I would like to add some other blood tests to what is scheduled and see if she can come in this week for labs instead of waiting.  I will order labs.  Needs lab appt changed to this week if possible.  Also need specific symptoms.  Also if any acute change or problems, she needs to be seen acutely.

## 2020-10-17 NOTE — Telephone Encounter (Signed)
Pt says that at night when she has something between her knees, she has no pain. Minimal pain this morning. It hurts to walk. Some discomfort when sitting. No pain when she is laying down. Pain is right across lower back. No other symptoms noted except the back pain. She does not feel she needs to go to urgent care. Was able to sleep well last night. Using tylenol for pain. Patient does not want to move labs up. She is ok to wait until Monday.

## 2020-10-17 NOTE — Telephone Encounter (Signed)
The lab orders are in epic for labs to be done here on Monday 10/21/20.  I have place order for back xray as well.  Can this be done at the same time as her labs.  If so, please let her know that her xray can be done here at [facility] when she comes in for labs.

## 2020-10-21 ENCOUNTER — Ambulatory Visit: Payer: Medicare Other

## 2020-10-21 ENCOUNTER — Other Ambulatory Visit: Payer: Self-pay

## 2020-10-21 ENCOUNTER — Ambulatory Visit (INDEPENDENT_AMBULATORY_CARE_PROVIDER_SITE_OTHER): Payer: Medicare Other

## 2020-10-21 ENCOUNTER — Other Ambulatory Visit (INDEPENDENT_AMBULATORY_CARE_PROVIDER_SITE_OTHER): Payer: Medicare Other

## 2020-10-21 DIAGNOSIS — L209 Atopic dermatitis, unspecified: Secondary | ICD-10-CM

## 2020-10-21 DIAGNOSIS — R944 Abnormal results of kidney function studies: Secondary | ICD-10-CM

## 2020-10-21 DIAGNOSIS — M545 Low back pain, unspecified: Secondary | ICD-10-CM

## 2020-10-21 LAB — BASIC METABOLIC PANEL
BUN: 35 mg/dL — ABNORMAL HIGH (ref 6–23)
CO2: 24 mEq/L (ref 19–32)
Calcium: 10 mg/dL (ref 8.4–10.5)
Chloride: 102 mEq/L (ref 96–112)
Creatinine, Ser: 1.88 mg/dL — ABNORMAL HIGH (ref 0.40–1.20)
GFR: 24.88 mL/min — ABNORMAL LOW (ref >60.00)
Glucose, Bld: 95 mg/dL (ref 70–99)
Potassium: 5 mEq/L (ref 3.5–5.1)
Sodium: 137 mEq/L (ref 135–145)

## 2020-10-21 LAB — SEDIMENTATION RATE: Sed Rate: 11 mm/hr (ref 0–30)

## 2020-10-21 NOTE — Progress Notes (Signed)
Patient treated in the lightbox/NBUVB for 1 minute and 42 seconds.

## 2020-10-22 ENCOUNTER — Ambulatory Visit
Admission: RE | Admit: 2020-10-22 | Discharge: 2020-10-22 | Disposition: A | Discharge status: Home / Self Care | Payer: Medicare Other | Source: Ambulatory Visit | Attending: Internal Medicine | Admitting: Internal Medicine

## 2020-10-22 DIAGNOSIS — N281 Cyst of kidney, acquired: Secondary | ICD-10-CM | POA: Diagnosis not present

## 2020-10-22 DIAGNOSIS — R944 Abnormal results of kidney function studies: Secondary | ICD-10-CM | POA: Diagnosis not present

## 2020-10-23 ENCOUNTER — Ambulatory Visit (INDEPENDENT_AMBULATORY_CARE_PROVIDER_SITE_OTHER): Payer: Medicare Other

## 2020-10-23 ENCOUNTER — Other Ambulatory Visit: Payer: Self-pay

## 2020-10-23 ENCOUNTER — Encounter: Payer: Self-pay | Admitting: Dermatology

## 2020-10-23 ENCOUNTER — Ambulatory Visit: Payer: Medicare Other | Admitting: Dermatology

## 2020-10-23 ENCOUNTER — Ambulatory Visit (INDEPENDENT_AMBULATORY_CARE_PROVIDER_SITE_OTHER): Payer: Medicare Other | Admitting: Dermatology

## 2020-10-23 ENCOUNTER — Other Ambulatory Visit: Payer: Self-pay | Admitting: Internal Medicine

## 2020-10-23 DIAGNOSIS — L2089 Other atopic dermatitis: Secondary | ICD-10-CM

## 2020-10-23 DIAGNOSIS — L209 Atopic dermatitis, unspecified: Secondary | ICD-10-CM | POA: Diagnosis not present

## 2020-10-23 DIAGNOSIS — L280 Lichen simplex chronicus: Secondary | ICD-10-CM | POA: Diagnosis not present

## 2020-10-23 DIAGNOSIS — R944 Abnormal results of kidney function studies: Secondary | ICD-10-CM

## 2020-10-23 MED ORDER — DUPILUMAB 300 MG/2ML ~~LOC~~ SOSY
300.0000 mg | PREFILLED_SYRINGE | Freq: Once | SUBCUTANEOUS | Status: AC
Start: 2020-10-23 — End: 2020-10-23
  Administered 2020-10-23: 300 mg via SUBCUTANEOUS

## 2020-10-23 NOTE — Progress Notes (Signed)
Order placed for nephrology referral.   °

## 2020-10-23 NOTE — Progress Notes (Signed)
   Follow-Up Visit   Subjective  Leah Huber is a 80 y.o. female who presents for the following: Follow-up (Patient here today for 2 week Dupixent follow up. Today will be 6th injection. Patient has also had 3 weeks of phototherapy. ).  Patient taking Dupixent 300mg /17ml sq injections q2wks, TMC 0.1% oint qd/bid up to 5d/wk aa rash,  Tacrolimus 0.1% oint qd/bid aa rash on body, Mupirocin oint qd to open sores. Allegra 180mg  qam  Patient not getting any new spots.   The following portions of the chart were reviewed this encounter and updated as appropriate:   Tobacco  Allergies  Meds  Problems  Med Hx  Surg Hx  Fam Hx      Review of Systems:  No other skin or systemic complaints except as noted in HPI or Assessment and Plan.  Objective  Well appearing patient in no apparent distress; mood and affect are within normal limits.  A focused examination was performed including arms, legs, back. Relevant physical exam findings are noted in the Assessment and Plan.  Objective  Bilateral arms, legs, back: Scattered scaly pink papules, many excoriated at back   Objective  Bilateral arms, legs: Lichenified pink plaques    Assessment & Plan  Other atopic dermatitis Bilateral arms, legs, back  Chronic condition with expected duration over one year. Condition is bothersome to patient. Improving but not currently at goal. Still with persistent itch and rash.  Continue NVUVB twice weekly - has been using twice weekly for 3 weeks Continue tacrolimus to less thick areas of rash 1-2 times daily Continue mupirocin daily to open sores.  Continue Dupixent 300mg /20mL sq q2 wks. Patient tolerating well. No irritation of eyes or cold sores. Has been on for 10 weeks.  Dupixent 300mg /73mL injected today to upper left arm.   Lichen simplex chronicus Bilateral arms, legs  Secondary to rubbing/scratching. Reviewed that avoiding rubbing and scratching as much as possible is important for  resolution.  Discussed IL triamcinolone injections vs topical steroid with occlusion. Given numerous involved sites, recommend topical steroid with occlusion and recheck in 2 weeks. Could consider cryotherapy vs ILK vs prednisone taper in future.  Start TMC 0.1% ointment to the thick areas and cover with band aid daily. Recheck in 2 weeks.   Return in about 2 weeks (around 11/06/2020) for [place].  Graciella Belton, RMA, am acting as scribe for Forest Gleason, MD .  Documentation: I have reviewed the above documentation for accuracy and completeness, and I agree with the above.  Forest Gleason, MD

## 2020-10-23 NOTE — Progress Notes (Signed)
Patient treated in the lightbox/NBUVB for 1 minutes and 52 seconds.

## 2020-10-23 NOTE — Patient Instructions (Addendum)
May continue Tacrolimus 0.1% oint 1-2 times daily to less thick areas of rash on body.  Continue Mupirocin oint daily to open sores. Restart Allegra 180mg   In the am  Continue triamcinolone 0.1% ointment to thicker areas once daily and cover with band aid. Avoid applying to face, groin, and axilla. Use as directed. Risk of skin atrophy with long-term use reviewed.   Topical steroids (such as triamcinolone, fluocinolone, fluocinonide, mometasone, clobetasol, halobetasol, betamethasone, hydrocortisone) can cause thinning and lightening of the skin if they are used for too long in the same area. Your physician has selected the right strength medicine for your problem and area affected on the body. Please use your medication only as directed by your physician to prevent side effects.

## 2020-10-24 ENCOUNTER — Other Ambulatory Visit: Payer: Self-pay

## 2020-10-24 DIAGNOSIS — L209 Atopic dermatitis, unspecified: Secondary | ICD-10-CM

## 2020-10-24 DIAGNOSIS — I480 Paroxysmal atrial fibrillation: Secondary | ICD-10-CM | POA: Diagnosis not present

## 2020-10-24 NOTE — Progress Notes (Signed)
New order for NarrowBand UVB placed under Dr. Alfonso Patten.

## 2020-10-25 ENCOUNTER — Other Ambulatory Visit: Payer: Self-pay

## 2020-10-25 ENCOUNTER — Other Ambulatory Visit
Admission: RE | Admit: 2020-10-25 | Discharge: 2020-10-25 | Disposition: A | Discharge status: Home / Self Care | Payer: Medicare Other | Source: Ambulatory Visit | Attending: Internal Medicine | Admitting: Internal Medicine

## 2020-10-25 DIAGNOSIS — Z20822 Contact with and (suspected) exposure to covid-19: Secondary | ICD-10-CM | POA: Diagnosis not present

## 2020-10-25 DIAGNOSIS — Z01812 Encounter for preprocedural laboratory examination: Secondary | ICD-10-CM | POA: Diagnosis not present

## 2020-10-25 LAB — SARS CORONAVIRUS 2 (TAT 6-24 HRS): SARS Coronavirus 2: NEGATIVE

## 2020-10-25 NOTE — Progress Notes (Signed)
Due to schedule opening, called patient at request of MD to ask if she can come in at 6:30am Tuesday 2/22 for procedure. Patient stated that her papers already stated to arrive at 6:30.

## 2020-10-28 ENCOUNTER — Ambulatory Visit: Payer: Medicare Other

## 2020-10-28 ENCOUNTER — Encounter: Payer: Self-pay | Admitting: Registered Nurse

## 2020-10-28 ENCOUNTER — Other Ambulatory Visit: Payer: Self-pay | Admitting: Internal Medicine

## 2020-10-29 ENCOUNTER — Encounter: Payer: Self-pay | Admitting: Internal Medicine

## 2020-10-29 ENCOUNTER — Ambulatory Visit
Admission: RE | Admit: 2020-10-29 | Discharge: 2020-10-29 | Disposition: A | Discharge status: Home / Self Care | Payer: Medicare Other | Source: Ambulatory Visit | Attending: Internal Medicine | Admitting: Internal Medicine

## 2020-10-29 ENCOUNTER — Encounter
Admission: RE | Disposition: A | Discharge status: Home / Self Care | Payer: Self-pay | Source: Ambulatory Visit | Attending: Internal Medicine

## 2020-10-29 ENCOUNTER — Other Ambulatory Visit: Payer: Self-pay

## 2020-10-29 DIAGNOSIS — Z87891 Personal history of nicotine dependence: Secondary | ICD-10-CM | POA: Insufficient documentation

## 2020-10-29 DIAGNOSIS — Z823 Family history of stroke: Secondary | ICD-10-CM | POA: Diagnosis not present

## 2020-10-29 DIAGNOSIS — Z7901 Long term (current) use of anticoagulants: Secondary | ICD-10-CM | POA: Diagnosis not present

## 2020-10-29 DIAGNOSIS — Z882 Allergy status to sulfonamides status: Secondary | ICD-10-CM | POA: Diagnosis not present

## 2020-10-29 DIAGNOSIS — Z539 Procedure and treatment not carried out, unspecified reason: Secondary | ICD-10-CM | POA: Insufficient documentation

## 2020-10-29 DIAGNOSIS — I480 Paroxysmal atrial fibrillation: Secondary | ICD-10-CM | POA: Insufficient documentation

## 2020-10-29 DIAGNOSIS — Z8249 Family history of ischemic heart disease and other diseases of the circulatory system: Secondary | ICD-10-CM | POA: Diagnosis not present

## 2020-10-29 DIAGNOSIS — Z881 Allergy status to other antibiotic agents status: Secondary | ICD-10-CM | POA: Diagnosis not present

## 2020-10-29 DIAGNOSIS — Z888 Allergy status to other drugs, medicaments and biological substances status: Secondary | ICD-10-CM | POA: Diagnosis not present

## 2020-10-29 DIAGNOSIS — Z79899 Other long term (current) drug therapy: Secondary | ICD-10-CM | POA: Diagnosis not present

## 2020-10-29 HISTORY — PX: CARDIOVERSION: SHX1299

## 2020-10-29 SURGERY — CARDIOVERSION
Anesthesia: General

## 2020-10-29 NOTE — Anesthesia Preprocedure Evaluation (Deleted)
Anesthesia Evaluation  Patient identified by MRN, date of birth, ID band Patient awake    Reviewed: Allergy & Precautions, H&P , NPO status , Patient's Chart, lab work & pertinent test results  History of Anesthesia Complications Negative for: history of anesthetic complications  Airway        Dental   Pulmonary sleep apnea , neg COPD, former smoker,           Cardiovascular hypertension, (-) angina+ CAD  (-) Past MI and (-) Cardiac Stents + dysrhythmias Atrial Fibrillation      Neuro/Psych PSYCHIATRIC DISORDERS Depression Chronic pain negative neurological ROS     GI/Hepatic Neg liver ROS, GERD  ,  Endo/Other  negative endocrine ROS  Renal/GU negative Renal ROS  negative genitourinary   Musculoskeletal   Abdominal   Peds  Hematology negative hematology ROS (+)   Anesthesia Other Findings Past Medical History: 07/23/2019: Abnormal liver function test No date: Anemia No date: Aortic aneurysm (HCC)     Comment:  "SMALL" No date: Arthritis     Comment:  neck to tailbone 12/08/2017: Bilateral carpal tunnel syndrome No date: Bronchitis No date: Chronic cough     Comment:  @ NIGHT/ SEASONAL ALLERGIES 2013-2014: Clostridium difficile infection No date: Colon polyp No date: Concussion No date: Coronary artery disease     Comment:  Dr Nehemiah Massed cardiologist No date: Depression No date: Diverticulosis No date: GERD (gastroesophageal reflux disease) No date: Glaucoma No date: Gout No date: Hemorrhoids No date: History of chicken pox No date: History of colonic polyps No date: History of shingles No date: Hypercholesterolemia No date: Hypertension     Comment:  CONTROLLED ON MEDS 12/10: Hypovolemia     Comment:  acute renal failure No date: IBS (irritable bowel syndrome) No date: Neuromuscular disorder ([place])     Comment:  occasionally tingling in hands/ NUMBNESS IN RIGHT FOOT               s/p back  surgery No date: Osteoarthritis     Comment:  bilateral knees No date: Osteopenia No date: Pneumonia     Comment:  HX OF No date: Sleep apnea     Comment:  very mild   sleep study 8 yrs ago does not use CPAP No date: Urinary incontinence  Past Surgical History: 1979: ABDOMINAL HYSTERECTOMY 11/28/2014: ANTERIOR CERVICAL DECOMP/DISCECTOMY FUSION; N/A     Comment:  Procedure: CERVICAL FIVE-SIX  ANTERIOR CERVICAL               DECOMPRESSION/DISCECTOMY FUSION 1 LEVEL;  Surgeon:               Newman Pies, MD;  Location: [facility];  Service:               Neurosurgery;  Laterality: N/A;  C56 anterior cervical               decompression with fusion interbody prosthesis plating               and bonegraft No date: BACK SURGERY     Comment:  X2/ L4,5 & S1/ Cervical C7? 1981: BLADDER SURGERY     Comment:  SUSPENSION No date: BREAST REDUCTION SURGERY 2007: CARPAL TUNNEL RELEASE 12/30/2016: CATARACT EXTRACTION W/PHACO; Right     Comment:  Procedure: CATARACT EXTRACTION PHACO AND INTRAOCULAR               LENS PLACEMENT ([facility])  Right toric lens;  Surgeon:  Leandrew Koyanagi, MD;  Location: [place];               Service: Ophthalmology;  Laterality: Right;  sleep apnea,              does not use CPAP 01/20/2017: CATARACT EXTRACTION W/PHACO; Left     Comment:  Procedure: CATARACT EXTRACTION PHACO AND INTRAOCULAR               LENS PLACEMENT ([place])  Left Toric;  Surgeon: Leandrew Koyanagi, MD;  Location: [place];  Service:               Ophthalmology;  Laterality: Left;  toric lens 1996: CHOLECYSTECTOMY No date: COLONOSCOPY 05/22/2019: COLONOSCOPY WITH PROPOFOL; N/A     Comment:  Procedure: COLONOSCOPY WITH PROPOFOL;  Surgeon: [place],               Benay Pike, MD;  Location: [facility];  Service:               Gastroenterology;  Laterality: N/A; 2005: FOOT SURGERY No date: JOINT REPLACEMENT; Bilateral     Comment:  [place]: LAMINECTOMY 2001: REDUCTION MAMMAPLASTY; Bilateral 2000 & 2004: ROTATOR CUFF REPAIR     Comment:  left and right 03/1997: SPINAL CORD STIMULATOR IMPLANT 2007: SPINAL CORD STIMULATOR REMOVAL No date: Tendon repair right shoulder No date: TONSILLECTOMY 10/2011 & 09/13: TOTAL KNEE ARTHROPLASTY     Comment:  left and right  BMI    Body Mass Index: 25.75 kg/m      Reproductive/Obstetrics negative OB ROS                             Anesthesia Physical Anesthesia Plan  ASA: III  Anesthesia Plan: General   Post-op Pain Management:    Induction:   PONV Risk Score and Plan:   Airway Management Planned:   Additional Equipment:   Intra-op Plan:   Post-operative Plan:   Informed Consent: I have reviewed the patients History and Physical, chart, labs and discussed the procedure including the risks, benefits and alternatives for the proposed anesthesia with the patient or authorized representative who has indicated his/her understanding and acceptance.     Dental Advisory Given  Plan Discussed with: Anesthesiologist, CRNA and Surgeon  Anesthesia Plan Comments:         Anesthesia Quick Evaluation

## 2020-10-30 ENCOUNTER — Telehealth: Payer: Self-pay | Admitting: Internal Medicine

## 2020-10-30 ENCOUNTER — Ambulatory Visit: Payer: Medicare Other

## 2020-10-30 NOTE — Telephone Encounter (Signed)
Please call pt and confirm doing ok.  Per chart review is s/p cardioversion yesterday.  Per previous message, we have been following her kidney function.  Has she heard from nephrology regarding appt.

## 2020-10-30 NOTE — Telephone Encounter (Signed)
Patient did not have to have procedure done. When she went in to have it done her heart was is sinus rhythm. She sees nephrology next week.

## 2020-11-06 DIAGNOSIS — F341 Dysthymic disorder: Secondary | ICD-10-CM | POA: Diagnosis not present

## 2020-11-06 DIAGNOSIS — K219 Gastro-esophageal reflux disease without esophagitis: Secondary | ICD-10-CM | POA: Diagnosis not present

## 2020-11-06 DIAGNOSIS — I10 Essential (primary) hypertension: Secondary | ICD-10-CM | POA: Diagnosis not present

## 2020-11-06 DIAGNOSIS — I480 Paroxysmal atrial fibrillation: Secondary | ICD-10-CM | POA: Diagnosis not present

## 2020-11-06 DIAGNOSIS — N179 Acute kidney failure, unspecified: Secondary | ICD-10-CM | POA: Diagnosis not present

## 2020-11-06 DIAGNOSIS — R8290 Unspecified abnormal findings in urine: Secondary | ICD-10-CM | POA: Diagnosis not present

## 2020-11-07 ENCOUNTER — Ambulatory Visit (INDEPENDENT_AMBULATORY_CARE_PROVIDER_SITE_OTHER): Payer: Medicare Other | Admitting: Dermatology

## 2020-11-07 ENCOUNTER — Other Ambulatory Visit: Payer: Self-pay

## 2020-11-07 DIAGNOSIS — L209 Atopic dermatitis, unspecified: Secondary | ICD-10-CM | POA: Diagnosis not present

## 2020-11-07 DIAGNOSIS — L280 Lichen simplex chronicus: Secondary | ICD-10-CM

## 2020-11-07 MED ORDER — DUPILUMAB 300 MG/2ML ~~LOC~~ SOSY
300.0000 mg | PREFILLED_SYRINGE | Freq: Once | SUBCUTANEOUS | Status: AC
  Administered 2020-11-07: 300 mg via SUBCUTANEOUS

## 2020-11-07 NOTE — Progress Notes (Signed)
   Follow-Up Visit   Subjective  Leah Huber is a 80 y.o. female who presents for the following: 2 Week Follow-up (Patient here today for 2 week follow up on atopic dermatitis. ).  Patient currently taking dupixent shots every 14 days. Itch is better. Also taking Allegra, using triamcinolone and bandage to stubborn areas and tacrolimus topical.  The following portions of the chart were reviewed this encounter and updated as appropriate:  Tobacco  Allergies  Meds  Problems  Med Hx  Surg Hx  Fam Hx       Objective  Well appearing patient in no apparent distress; mood and affect are within normal limits.  A focused examination was performed including Bilateral arms, legs, back. Relevant physical exam findings are noted in the Assessment and Plan.  Objective  Bilateral arms, legs, back: Scaly pink papules coalescing to plaques  Objective  Bilateral arms, legs: Few lichenified papules  Assessment & Plan  Atopic dermatitis, unspecified type Bilateral arms, legs, back  Chronic condition with expected duration over one year. Condition is bothersome to patient. Improving but not currently at goal. Still with persistent rash.   Continue NVUVB twice weekly - has been using twice weekly for 5 weeks. Continue mupirocin as needed daily to open sores.  Continue Dupixent 300mg /79mL sq q2 wks. Patient tolerating well. No irritation of eyes or cold sores.  Can try stopping Allegra. Resume if pruritus recurs. Only use tacrolimus or triamcinolone to itchy areas.  Lichen simplex chronicus Bilateral arms, legs  Significantly improved.  Secondary to rubbing/scratching. Reviewed that avoiding rubbing and scratching as much as possible is important for resolution.   Stop triamcinolone and monitor.  Return in about 2 weeks (around 11/21/2020) for 2 week dupixent shot Nurse visit, 6 weeks atopic dermatitis follow up.  I, Ruthell Rummage, CMA, am acting as scribe for Forest Gleason,  MD.  Documentation: I have reviewed the above documentation for accuracy and completeness, and I agree with the above.  Forest Gleason, MD

## 2020-11-10 ENCOUNTER — Encounter: Payer: Self-pay | Admitting: Dermatology

## 2020-11-12 ENCOUNTER — Other Ambulatory Visit: Payer: Self-pay

## 2020-11-12 ENCOUNTER — Ambulatory Visit (INDEPENDENT_AMBULATORY_CARE_PROVIDER_SITE_OTHER): Payer: Medicare Other

## 2020-11-12 DIAGNOSIS — L209 Atopic dermatitis, unspecified: Secondary | ICD-10-CM

## 2020-11-13 NOTE — Progress Notes (Signed)
Patient treated in the lightbox/NBUVB for 2 minutes and 22 seconds.

## 2020-11-14 ENCOUNTER — Other Ambulatory Visit: Payer: Self-pay

## 2020-11-14 ENCOUNTER — Ambulatory Visit (INDEPENDENT_AMBULATORY_CARE_PROVIDER_SITE_OTHER): Payer: Medicare Other

## 2020-11-14 DIAGNOSIS — L209 Atopic dermatitis, unspecified: Secondary | ICD-10-CM | POA: Diagnosis not present

## 2020-11-14 NOTE — Progress Notes (Signed)
Patient treated in the lightbox/NBUVB for 2 minutes and 32 seconds.

## 2020-11-19 ENCOUNTER — Other Ambulatory Visit: Payer: Self-pay

## 2020-11-19 ENCOUNTER — Ambulatory Visit (INDEPENDENT_AMBULATORY_CARE_PROVIDER_SITE_OTHER): Payer: Medicare Other

## 2020-11-19 DIAGNOSIS — L209 Atopic dermatitis, unspecified: Secondary | ICD-10-CM

## 2020-11-20 NOTE — Progress Notes (Signed)
Patient treated in the lightbox/NBUVB for 2 minutes and 42 seconds.

## 2020-11-21 ENCOUNTER — Other Ambulatory Visit: Payer: Self-pay

## 2020-11-21 ENCOUNTER — Ambulatory Visit (INDEPENDENT_AMBULATORY_CARE_PROVIDER_SITE_OTHER): Payer: Medicare Other

## 2020-11-21 DIAGNOSIS — L209 Atopic dermatitis, unspecified: Secondary | ICD-10-CM | POA: Diagnosis not present

## 2020-11-21 MED ORDER — DUPILUMAB 300 MG/2ML ~~LOC~~ SOSY
300.0000 mg | PREFILLED_SYRINGE | Freq: Once | SUBCUTANEOUS | Status: AC
  Administered 2020-11-21: 300 mg via SUBCUTANEOUS

## 2020-11-21 NOTE — Progress Notes (Signed)
Patient treated in the lightbox/NBUVB for 2 minutes and 52 seconds.    Patient here for two week Dupixent injection.   Dupixent 300mg /58mL injected SQ into left upper arm. Patient tolerated well.   LOT: [id number]  EXP: 03/2023

## 2020-11-22 DIAGNOSIS — I480 Paroxysmal atrial fibrillation: Secondary | ICD-10-CM | POA: Diagnosis not present

## 2020-11-22 DIAGNOSIS — I2510 Atherosclerotic heart disease of native coronary artery without angina pectoris: Secondary | ICD-10-CM | POA: Diagnosis not present

## 2020-11-22 DIAGNOSIS — I10 Essential (primary) hypertension: Secondary | ICD-10-CM | POA: Diagnosis not present

## 2020-11-22 DIAGNOSIS — E782 Mixed hyperlipidemia: Secondary | ICD-10-CM | POA: Diagnosis not present

## 2020-11-26 ENCOUNTER — Other Ambulatory Visit: Payer: Self-pay

## 2020-11-26 ENCOUNTER — Ambulatory Visit (INDEPENDENT_AMBULATORY_CARE_PROVIDER_SITE_OTHER): Payer: Medicare Other

## 2020-11-26 DIAGNOSIS — L209 Atopic dermatitis, unspecified: Secondary | ICD-10-CM

## 2020-11-26 NOTE — Progress Notes (Signed)
Patient treated in the lightbox/NBUVB for 3 minutes and 02 seconds.

## 2020-11-28 ENCOUNTER — Other Ambulatory Visit: Payer: Self-pay

## 2020-11-28 ENCOUNTER — Ambulatory Visit (INDEPENDENT_AMBULATORY_CARE_PROVIDER_SITE_OTHER): Payer: Medicare Other

## 2020-11-28 DIAGNOSIS — L209 Atopic dermatitis, unspecified: Secondary | ICD-10-CM

## 2020-11-28 NOTE — Progress Notes (Signed)
Patient treated in the lightbox/NBUVB for 3 minutes and 12 seconds.

## 2020-11-29 ENCOUNTER — Telehealth: Payer: Self-pay | Admitting: Dermatology

## 2020-11-29 NOTE — Telephone Encounter (Signed)
Spoke with patient about persistent itch she is having. She did stop allegra after the last visit and has not restarted. She is also using mupirocin to the itchy areas.  Recommended restarting allegra and using triamcinolone twice a day as needed to itchy areas at arms with plan to recheck at her follow-up in a couple of weeks.  Also recommended OTC Gold Bond Rapid Relief Anti-Itch cream (pramoxine + menthol) up to 3 times per day to areas that are itchy.  Will continue dupixent and nbUVB.

## 2020-12-03 ENCOUNTER — Ambulatory Visit (INDEPENDENT_AMBULATORY_CARE_PROVIDER_SITE_OTHER): Payer: Medicare Other

## 2020-12-03 ENCOUNTER — Other Ambulatory Visit: Payer: Self-pay

## 2020-12-03 DIAGNOSIS — L209 Atopic dermatitis, unspecified: Secondary | ICD-10-CM

## 2020-12-03 NOTE — Progress Notes (Signed)
Patient treated in the lightbox/NBUVB for 3 minutes and 22 seconds.

## 2020-12-05 ENCOUNTER — Ambulatory Visit (INDEPENDENT_AMBULATORY_CARE_PROVIDER_SITE_OTHER): Payer: Medicare Other

## 2020-12-05 ENCOUNTER — Other Ambulatory Visit: Payer: Self-pay

## 2020-12-05 DIAGNOSIS — L209 Atopic dermatitis, unspecified: Secondary | ICD-10-CM | POA: Diagnosis not present

## 2020-12-05 MED ORDER — DUPILUMAB 300 MG/2ML ~~LOC~~ SOSY
300.0000 mg | PREFILLED_SYRINGE | Freq: Once | SUBCUTANEOUS | Status: AC
  Administered 2020-12-05: 300 mg via SUBCUTANEOUS

## 2020-12-05 NOTE — Progress Notes (Signed)
Patient here for lightbox treatment and Dupixent injection.  Dupixent 300mg /30mL injected into patient's right arm. Patient tolerated well.  LOT: [id number] EXP: 04/2023  Patient treated in the lightbox/NBUVB for 3 minutes and 32 seconds.

## 2020-12-10 ENCOUNTER — Ambulatory Visit: Payer: Medicare Other

## 2020-12-12 ENCOUNTER — Ambulatory Visit: Payer: Medicare Other

## 2020-12-17 ENCOUNTER — Ambulatory Visit (INDEPENDENT_AMBULATORY_CARE_PROVIDER_SITE_OTHER): Payer: Medicare Other

## 2020-12-17 ENCOUNTER — Other Ambulatory Visit: Payer: Self-pay

## 2020-12-17 DIAGNOSIS — L209 Atopic dermatitis, unspecified: Secondary | ICD-10-CM | POA: Diagnosis not present

## 2020-12-17 NOTE — Progress Notes (Signed)
Patient treated in the lightbox/NBUVB for 3 minutes and 42 seconds.

## 2020-12-18 DIAGNOSIS — N2581 Secondary hyperparathyroidism of renal origin: Secondary | ICD-10-CM | POA: Diagnosis not present

## 2020-12-18 DIAGNOSIS — N1832 Chronic kidney disease, stage 3b: Secondary | ICD-10-CM | POA: Diagnosis not present

## 2020-12-18 DIAGNOSIS — I10 Essential (primary) hypertension: Secondary | ICD-10-CM | POA: Diagnosis not present

## 2020-12-19 ENCOUNTER — Ambulatory Visit (INDEPENDENT_AMBULATORY_CARE_PROVIDER_SITE_OTHER): Payer: Medicare Other

## 2020-12-19 ENCOUNTER — Ambulatory Visit (INDEPENDENT_AMBULATORY_CARE_PROVIDER_SITE_OTHER): Payer: Medicare Other | Admitting: Dermatology

## 2020-12-19 ENCOUNTER — Other Ambulatory Visit: Payer: Self-pay

## 2020-12-19 DIAGNOSIS — L209 Atopic dermatitis, unspecified: Secondary | ICD-10-CM

## 2020-12-19 DIAGNOSIS — L719 Rosacea, unspecified: Secondary | ICD-10-CM | POA: Diagnosis not present

## 2020-12-19 MED ORDER — DUPILUMAB 300 MG/2ML ~~LOC~~ SOSY
300.0000 mg | PREFILLED_SYRINGE | Freq: Once | SUBCUTANEOUS | Status: AC
  Administered 2020-12-19: 300 mg via SUBCUTANEOUS

## 2020-12-19 NOTE — Patient Instructions (Addendum)
Tacrolimus 0.1 % ointment    Dupilumab (Dupixent) is a treatment given by injection for adults with moderate-to-severe atopic dermatitis. Goal is control of skin condition, not cure. It is given as 2 injections at the first dose followed by 1 injection ever 2 weeks thereafter.  Potential side effects include allergic reaction, herpes infections, injection site reactions and conjunctivitis (inflammation of the eyes).  The use of Dupixent requires long term medication management, including periodic office visits.  Rosacea is a chronic progressive skin condition usually affecting the face of adults, causing redness and/or acne bumps. It is treatable but not curable. It sometimes affects the eyes (ocular rosacea) as well. It may respond to topical and/or systemic medication and can flare with stress, sun exposure, alcohol, exercise and some foods.  Daily application of broad spectrum spf 30+ sunscreen to face is recommended to reduce flares.   If you have any questions or concerns for your doctor, please call our main line at [phone] and press option 4 to reach your doctor's medical assistant. If no one answers, please leave a voicemail as directed and we will return your call as soon as possible. Messages left after 4 pm will be answered the following business day.   You may also send [place] a message via [place]. We typically respond to MyChart messages within 1-2 business days.  For prescription refills, please ask your pharmacy to contact our office. Our fax number is [phone].  If you have an urgent issue when the clinic is closed that cannot wait until the next business day, you can page your doctor at the number below.    Please note that while we do our best to be available for urgent issues outside of office hours, we are not available 24/7.   If you have an urgent issue and are unable to reach [place], you may choose to seek medical care at your doctor's office, retail clinic, urgent care  center, or emergency room.  If you have a medical emergency, please immediately call 911 or go to the emergency department.  Pager Numbers  - Dr. Nehemiah Massed: [phone]  - Dr. Laurence Ferrari: [phone]  - Dr. Nicole Kindred: [phone]  In the event of inclement weather, please call our main line at [phone] for an update on the status of any delays or closures.  Dermatology Medication Tips: Please keep the boxes that topical medications come in in order to help keep track of the instructions about where and how to use these. Pharmacies typically print the medication instructions only on the boxes and not directly on the medication tubes.   If your medication is too expensive, please contact our office at [phone] option 4 or send [place] a message through [place].   We are unable to tell what your co-pay for medications will be in advance as this is different depending on your insurance coverage. However, we may be able to find a substitute medication at lower cost or fill out paperwork to get insurance to cover a needed medication.   If a prior authorization is required to get your medication covered by your insurance company, please allow [place] 1-2 business days to complete this process.  Drug prices often vary depending on where the prescription is filled and some pharmacies may offer cheaper prices.  The website www.goodrx.com contains coupons for medications through different pharmacies. The prices here do not account for what the cost may be with help from insurance (it may be cheaper with your insurance), but the website can  give you the price if you did not use any insurance.  - You can print the associated coupon and take it with your prescription to the pharmacy.  - You may also stop by our office during regular business hours and pick up a GoodRx coupon card.  - If you need your prescription sent electronically to a different pharmacy, notify our office through [facility] or by  phone at [phone] option 4.

## 2020-12-19 NOTE — Progress Notes (Signed)
   Follow-Up Visit   Subjective  Leah Huber is a 80 y.o. female who presents for the following: 6 week follow up (Patient is here today for follow up on atopic dermatitis. She would like to have face checked today. She has noticed a little redness since receiving light box treatment. ).  She has had some itch come back at the arms. She has not been using the tacrolimus or a topical steroid to this area.  She has been using goldbond rapid relief anti-itch with some relief.   The following portions of the chart were reviewed this encounter and updated as appropriate:  Tobacco  Allergies  Meds  Problems  Med Hx  Surg Hx  Fam Hx      Objective  Well appearing patient in no apparent distress; mood and affect are within normal limits.  A focused examination was performed including bilateral arms and face . Relevant physical exam findings are noted in the Assessment and Plan.  Objective  Bilateral arms, legs, back: Few excoriated scaly pink papules and plaques at arms   Objective  face: Mid face erythema with telangiectasias +/- scattered inflammatory papules.   Assessment & Plan  Atopic dermatitis, unspecified type Bilateral arms, legs, back  Chronic condition with duration or expected duration over one year. Condition is bothersome to patient. Not at goal.  Decrease NVUVB to once weekly - has been using twice weekly for 5 weeks. Continue mupirocin as needed daily to open sores.  Continue Dupixent 300mg /23mL sq q2 wks. Patient tolerating well. No irritation of eyes or cold sores. Dupixent 300 mg/13ml  given today in right arm. Patient tolerated well.   Restart Tacrolimus 0.1 % ointment apply to affected areas. If not clearing with this, will consider Opzelura.  Patient states she will give [place] a call if she needs refill.    Rosacea face  Rosacea is a chronic progressive skin condition usually affecting the face of adults, causing redness and/or acne bumps. It is  treatable but not curable. It sometimes affects the eyes (ocular rosacea) as well. It may respond to topical and/or systemic medication and can flare with stress, sun exposure, alcohol, exercise and some foods.  Daily application of broad spectrum spf 30+ sunscreen to face is recommended to reduce flares.  Denies grittiness or itchiness of eyes   Defer doxycycline as patient is on phototherapy  May consider Metronidazole or skin medicinals triple cream (depending on price for metronidazole) in future.  Given in office sample of prescription Zilxi foam to try - Apply daily Lot: [id number] exp: July 2022   Return in about 4 weeks (around 01/16/2021) for atopic derm follow up.  I, Ruthell Rummage, CMA, am acting as scribe for Forest Gleason, MD.  Documentation: I have reviewed the above documentation for accuracy and completeness, and I agree with the above.  Forest Gleason, MD

## 2020-12-20 ENCOUNTER — Encounter: Payer: Self-pay | Admitting: Dermatology

## 2020-12-23 ENCOUNTER — Other Ambulatory Visit: Payer: Self-pay

## 2020-12-23 MED ORDER — TRIAMCINOLONE ACETONIDE 0.1 % EX OINT: TOPICAL_OINTMENT | CUTANEOUS | 0 refills | Status: DC

## 2020-12-23 NOTE — Progress Notes (Signed)
Rx refill received from [facility] for [facility] 0.1% ointment 454g for a 90 days supply.

## 2020-12-26 ENCOUNTER — Other Ambulatory Visit: Payer: Self-pay

## 2020-12-26 ENCOUNTER — Ambulatory Visit (INDEPENDENT_AMBULATORY_CARE_PROVIDER_SITE_OTHER): Payer: Medicare Other

## 2020-12-26 DIAGNOSIS — L209 Atopic dermatitis, unspecified: Secondary | ICD-10-CM

## 2020-12-26 NOTE — Progress Notes (Signed)
Patient treated in the lightbox/NBUVB for 4 minutes and 2 seconds.

## 2020-12-30 NOTE — Progress Notes (Signed)
Patient treated in the lightbox/NBUVB for 3 minutes and 52 seconds.

## 2020-12-31 NOTE — Addendum Note (Signed)
Addended by: Harriett Sine on: 12/31/2020 03:36 PM   Modules accepted: Orders

## 2021-01-02 ENCOUNTER — Other Ambulatory Visit: Payer: Self-pay

## 2021-01-02 ENCOUNTER — Ambulatory Visit (INDEPENDENT_AMBULATORY_CARE_PROVIDER_SITE_OTHER): Payer: Medicare Other

## 2021-01-02 DIAGNOSIS — L209 Atopic dermatitis, unspecified: Secondary | ICD-10-CM | POA: Diagnosis not present

## 2021-01-02 MED ORDER — DUPILUMAB 300 MG/2ML ~~LOC~~ SOSY
300.0000 mg | PREFILLED_SYRINGE | Freq: Once | SUBCUTANEOUS | Status: AC
  Administered 2021-01-02: 300 mg via SUBCUTANEOUS

## 2021-01-02 NOTE — Addendum Note (Signed)
Addended by: Johnsie Kindred R on: 01/02/2021 02:17 PM   Modules accepted: Orders

## 2021-01-02 NOTE — Progress Notes (Signed)
Patient here for two week Dupixent injection and lightbox treatment.    Patient treated in the lightbox/NBUVB for 4 minutes and 12 seconds.   Dupixent 300mg /32mL injected into left upper arm. Patient tolerated well.  LOT: [id number] EXP: 05/2023

## 2021-01-06 ENCOUNTER — Ambulatory Visit (INDEPENDENT_AMBULATORY_CARE_PROVIDER_SITE_OTHER): Payer: Medicare Other | Admitting: Internal Medicine

## 2021-01-06 ENCOUNTER — Other Ambulatory Visit: Payer: Self-pay

## 2021-01-06 ENCOUNTER — Encounter: Payer: Self-pay | Admitting: Internal Medicine

## 2021-01-06 VITALS — BP 112/68 | HR 60 | Temp 96.7°F | Resp 16 | Ht 64.0 in | Wt 153.0 lb

## 2021-01-06 DIAGNOSIS — I4891 Unspecified atrial fibrillation: Secondary | ICD-10-CM | POA: Diagnosis not present

## 2021-01-06 DIAGNOSIS — Z0000 Encounter for general adult medical examination without abnormal findings: Secondary | ICD-10-CM

## 2021-01-06 DIAGNOSIS — D649 Anemia, unspecified: Secondary | ICD-10-CM

## 2021-01-06 DIAGNOSIS — K219 Gastro-esophageal reflux disease without esophagitis: Secondary | ICD-10-CM | POA: Diagnosis not present

## 2021-01-06 DIAGNOSIS — I10 Essential (primary) hypertension: Secondary | ICD-10-CM

## 2021-01-06 DIAGNOSIS — E7800 Pure hypercholesterolemia, unspecified: Secondary | ICD-10-CM

## 2021-01-06 DIAGNOSIS — F439 Reaction to severe stress, unspecified: Secondary | ICD-10-CM

## 2021-01-06 DIAGNOSIS — I712 Thoracic aortic aneurysm, without rupture, unspecified: Secondary | ICD-10-CM

## 2021-01-06 DIAGNOSIS — G4730 Sleep apnea, unspecified: Secondary | ICD-10-CM

## 2021-01-06 DIAGNOSIS — N1832 Chronic kidney disease, stage 3b: Secondary | ICD-10-CM

## 2021-01-06 LAB — BASIC METABOLIC PANEL WITH GFR
BUN: 40 mg/dL — ABNORMAL HIGH (ref 6–23)
CO2: 25 meq/L (ref 19–32)
Calcium: 9.5 mg/dL (ref 8.4–10.5)
Chloride: 102 meq/L (ref 96–112)
Creatinine, Ser: 1.54 mg/dL — ABNORMAL HIGH (ref 0.40–1.20)
GFR: 31.56 mL/min — ABNORMAL LOW
Glucose, Bld: 80 mg/dL (ref 70–99)
Potassium: 4.7 meq/L (ref 3.5–5.1)
Sodium: 138 meq/L (ref 135–145)

## 2021-01-06 LAB — HEPATIC FUNCTION PANEL
ALT: 12 U/L (ref 0–35)
AST: 21 U/L (ref 0–37)
Albumin: 4.5 g/dL (ref 3.5–5.2)
Alkaline Phosphatase: 61 U/L (ref 39–117)
Bilirubin, Direct: 0.1 mg/dL (ref 0.0–0.3)
Total Bilirubin: 0.8 mg/dL (ref 0.2–1.2)
Total Protein: 6.6 g/dL (ref 6.0–8.3)

## 2021-01-06 LAB — LIPID PANEL
Cholesterol: 262 mg/dL — ABNORMAL HIGH (ref 0–200)
HDL: 84.5 mg/dL
NonHDL: 177.35
Total CHOL/HDL Ratio: 3
Triglycerides: 239 mg/dL — ABNORMAL HIGH (ref 0.0–149.0)
VLDL: 47.8 mg/dL — ABNORMAL HIGH (ref 0.0–40.0)

## 2021-01-06 LAB — LDL CHOLESTEROL, DIRECT: Direct LDL: 145 mg/dL

## 2021-01-06 NOTE — Assessment & Plan Note (Signed)
Physical today 01/06/21.  Mammogram 07/11/20 - Birads I.  Colonoscopy 05/22/19 - tubular adenoma - hepatic flexure and internal hemorrhoids.  No f/u colonoscopy.

## 2021-01-06 NOTE — Progress Notes (Signed)
Patient ID: Leah Huber, female   DOB: 08-Feb-1941, 80 y.o.   MRN: [id number]   Subjective:    Patient ID: Leah Huber, female    DOB: 1941/07/02, 80 y.o.   MRN: [id number]  HPI This visit occurred during the SARS-CoV-2 public health emergency.  Safety protocols were in place, including screening questions prior to the visit, additional usage of staff PPE, and extensive cleaning of exam room while observing appropriate contact time as indicated for disinfecting solutions.  Patient with past history of hypertension, afib, CKD and hypercholesterolemia.  She comes in today to follow up on these issues as well as for a complete physical exam.  She reports she is doing relatively well.  Tries to stay active.  Has had problems recently with increased rash.  Seeing dermatology - Dr Laurence Ferrari. States undergoing light treatment.  Seeing cardiology for afib.  Taking metoprolol 25mg  in am and 50mg  in pm.  Taking 1/2 losartan now.  Also on multaq and eliquis.  No chest pain.  Breathing stable.  Cardiology discussing possible ablation.  No abdominal pain.  No nausea or vomiting.  Occasional diarrhea.  Not persistent.  Discussed labs.  Discussed restarting cholesterol medication.  Some dizziness - not persistent.  Will notify me if desires further w/up.   Past Medical History:  Diagnosis Date  . Abnormal liver function test 07/23/2019  . Anemia   . Aortic aneurysm (HCC)    "SMALL"  . Arthritis    neck to tailbone  . Bilateral carpal tunnel syndrome 12/08/2017  . Bronchitis   . Chronic cough    @ NIGHT/ SEASONAL ALLERGIES  . Clostridium difficile infection 2013-2014  . Colon polyp   . Concussion   . Coronary artery disease    Dr Nehemiah Massed cardiologist  . Depression   . Diverticulosis   . GERD (gastroesophageal reflux disease)   . Glaucoma   . Gout   . Hemorrhoids   . History of chicken pox   . History of colonic polyps   . History of shingles   . Hypercholesterolemia   . Hypertension     CONTROLLED ON MEDS  . Hypovolemia 12/10   acute renal failure  . IBS (irritable bowel syndrome)   . Neuromuscular disorder ([place])    occasionally tingling in hands/ NUMBNESS IN RIGHT FOOT s/p back surgery  . Osteoarthritis    bilateral knees  . Osteopenia   . Pneumonia    HX OF  . Sleep apnea    very mild   sleep study 8 yrs ago does not use CPAP  . Urinary incontinence    Past Surgical History:  Procedure Laterality Date  . ABDOMINAL HYSTERECTOMY  1979  . ANTERIOR CERVICAL DECOMP/DISCECTOMY FUSION N/A 11/28/2014   Procedure: CERVICAL FIVE-SIX  ANTERIOR CERVICAL DECOMPRESSION/DISCECTOMY FUSION 1 LEVEL;  Surgeon: Newman Pies, MD;  Location: [place] [facility];  Service: Neurosurgery;  Laterality: N/A;  C56 anterior cervical decompression with fusion interbody prosthesis plating and bonegraft  . BACK SURGERY     X2/ L4,5 & S1/ Cervical C7?  . BLADDER SURGERY  1981   SUSPENSION  . BREAST REDUCTION SURGERY    . CARDIOVERSION N/A 10/29/2020   Procedure: CARDIOVERSION;  Surgeon: Corey Skains, MD;  Location: [facility];  Service: Cardiovascular;  Laterality: N/A;  . CARPAL TUNNEL RELEASE  2007  . CATARACT EXTRACTION W/PHACO Right 12/30/2016   Procedure: CATARACT EXTRACTION PHACO AND INTRAOCULAR LENS PLACEMENT (IOC)  Right toric lens;  Surgeon: Leandrew Koyanagi, MD;  Location:  [place];  Service: Ophthalmology;  Laterality: Right;  sleep apnea, does not use CPAP  . CATARACT EXTRACTION W/PHACO Left 01/20/2017   Procedure: CATARACT EXTRACTION PHACO AND INTRAOCULAR LENS PLACEMENT ([place])  Left Toric;  Surgeon: Leandrew Koyanagi, MD;  Location: [place];  Service: Ophthalmology;  Laterality: Left;  toric lens  . CHOLECYSTECTOMY  1996  . COLONOSCOPY    . COLONOSCOPY WITH PROPOFOL N/A 05/22/2019   Procedure: COLONOSCOPY WITH PROPOFOL;  Surgeon: Tol[place], Benay Pike, MD;  Location: [facility];  Service: Gastroenterology;  Laterality: N/A;  . FOOT SURGERY  2005  . JOINT  REPLACEMENT Bilateral    KNEE  . [place]  . REDUCTION MAMMAPLASTY Bilateral 2001  . [place] & 2004   left and right  . SPINAL CORD STIMULATOR IMPLANT  03/1997  . SPINAL CORD STIMULATOR REMOVAL  2007  . Tendon repair right shoulder    . TONSILLECTOMY    . TOTAL KNEE ARTHROPLASTY  10/2011 & 09/13   left and right   Family History  Problem Relation Age of Onset  . Heart disease Mother   . Stroke Mother   . Hypertension Mother   . Hyperlipidemia Father   . Heart disease Father   . Heart disease Brother        2 brothers  . Alzheimer's disease Brother   . Heart disease Sister        pacemaker  . Breast cancer Daughter 80   Social History   Socioeconomic History  . Marital status: Widowed    Spouse name: Not on file  . Number of children: 2  . Years of education: Not on file  . Highest education level: Not on file  Occupational History  . Not on file  Tobacco Use  . Smoking status: Former Smoker    Packs/day: 1.00    Years: 20.00    Pack years: 20.00    Types: Cigarettes    Quit date: 09/07/1988    Years since quitting: 32.3  . Smokeless tobacco: Never Used  Vaping Use  . Vaping Use: Never used  Substance and Sexual Activity  . Alcohol use: Yes    Alcohol/week: 2.0 standard drinks    Types: 2 Glasses of wine per week    Comment: occasional  . Drug use: No  . Sexual activity: Never  Other Topics Concern  . Not on file  Social History Narrative   She is widowed, has two daughters.   Social Determinants of Health   Financial Resource Strain: Low Risk   . Difficulty of Paying Living Expenses: Not hard at all  Food Insecurity: No Food Insecurity  . Worried About Charity fundraiser in the Last Year: Never true  . Ran Out of Food in the Last Year: Never true  Transportation Needs: No Transportation Needs  . Lack of Transportation (Medical): No  . Lack of Transportation (Non-Medical): No  Physical Activity: Not on file  Stress:  No Stress Concern Present  . Feeling of Stress : Not at all  Social Connections: Unknown  . Frequency of Communication with Friends and Family: More than three times a week  . Frequency of Social Gatherings with Friends and Family: More than three times a week  . Attends Religious Services: More than 4 times per year  . Active Member of Clubs or Organizations: Yes  . Attends Archivist Meetings: More than 4 times per year  . Marital Status:  Not on file    Outpatient Encounter Medications as of 01/06/2021  Medication Sig  . apixaban (ELIQUIS) 5 MG TABS tablet Take 5 mg by mouth 2 (two) times daily.  . cyanocobalamin 1000 MCG tablet Take 1,000 mcg by mouth.  . DETROL LA 4 MG 24 hr capsule TAKE 1 CAPSULE DAILY  . dronedarone (MULTAQ) 400 MG tablet Take 400 mg by mouth 2 (two) times daily.  . dupilumab (DUPIXENT) 300 MG/2ML prefilled syringe Inject 300 mg into the skin every 14 (fourteen) days. Starting at day 15 for maintenance. (Patient taking differently: Inject 600 mg into the skin every 14 (fourteen) days. Starting at day 15 for maintenance.)  . latanoprost (XALATAN) 0.005 % ophthalmic solution Place 1 drop into both eyes at bedtime.   Marland Kitchen Lifitegrast (XIIDRA) 5 % SOLN Place 1 drop into both eyes daily.  Marland Kitchen losartan (COZAAR) 50 MG tablet TAKE 1 TABLET DAILY (Patient taking differently: Take 50 mg by mouth daily.)  . metoprolol succinate (TOPROL-XL) 25 MG 24 hr tablet Take 25 mg by mouth daily.  . metoprolol succinate (TOPROL-XL) 50 MG 24 hr tablet Take 50 mg by mouth at bedtime. Take with or immediately following a meal.  . mupirocin ointment (BACTROBAN) 2 % Apply to affected areas twice a day for 3 days, then once a day along with Triamcinolone. (Patient taking differently: Apply 1 application topically See admin instructions. Apply to affected areas twice a day for 3 days, then once a day along with Triamcinolone.)  . pantoprazole (PROTONIX) 40 MG tablet TAKE 1 TABLET TWICE A DAY  (Patient taking differently: Take 40 mg by mouth daily.)  . tacrolimus (PROTOPIC) 0.1 % ointment Apply topically daily. (Patient taking differently: Apply 1 application topically daily.)  . [DISCONTINUED] calcitRIOL (ROCALTROL) 0.25 MCG capsule Take 1 capsule by mouth every other day. (Patient not taking: Reported on 12/19/2020)  . [DISCONTINUED] triamcinolone ointment (KENALOG) 0.1 % Apply once a day to affected areas along with Mupirocin. (Patient not taking: Reported on 01/06/2021)  . [DISCONTINUED] triamcinolone ointment (KENALOG) 0.1 % Apply to itchy rash QD-BID PRN. Avoid face, groin, axilla.   No facility-administered encounter medications on file as of 01/06/2021.    Review of Systems  Constitutional: Negative for appetite change and unexpected weight change.  HENT: Negative for congestion, sinus pressure and sore throat.   Eyes: Negative for pain and visual disturbance.  Respiratory: Negative for cough and chest tightness.        Breathing stable.   Cardiovascular: Negative for chest pain and leg swelling.  Gastrointestinal: Negative for abdominal pain, nausea and vomiting.  Genitourinary: Negative for difficulty urinating and dysuria.  Musculoskeletal: Negative for joint swelling and myalgias.  Skin: Negative for color change.       Seeing dermatology for rash.   Neurological: Negative for dizziness, light-headedness and headaches.  Hematological: Negative for adenopathy. Does not bruise/bleed easily.  Psychiatric/Behavioral: Negative for agitation and dysphoric mood.       Objective:    Physical Exam Vitals reviewed.  Constitutional:      General: She is not in acute distress.    Appearance: Normal appearance. She is well-developed.  HENT:     Head: Normocephalic and atraumatic.     Right Ear: External ear normal.     Left Ear: External ear normal.  Eyes:     General: No scleral icterus.       Right eye: No discharge.        Left eye: No discharge.  Neck:  Thyroid:  No thyromegaly.  Cardiovascular:     Rate and Rhythm: Normal rate and regular rhythm.  Pulmonary:     Effort: No tachypnea, accessory muscle usage or respiratory distress.     Breath sounds: Normal breath sounds. No decreased breath sounds or wheezing.  Chest:  Breasts:     Right: No inverted nipple, mass, nipple discharge or tenderness (no axillary adenopathy).     Left: No inverted nipple, mass, nipple discharge or tenderness (no axilarry adenopathy).    Abdominal:     General: Bowel sounds are normal.     Palpations: Abdomen is soft.     Tenderness: There is no abdominal tenderness.  Musculoskeletal:        General: No tenderness.     Cervical back: Neck supple. No tenderness.  Lymphadenopathy:     Cervical: No cervical adenopathy.  Skin:    General: Skin is warm.     Findings: No rash.  Neurological:     Mental Status: She is alert and oriented to person, place, and time.  Psychiatric:        Mood and Affect: Mood normal.        Behavior: Behavior normal.     BP 112/68   Pulse 60   Temp (!) 96.7 F (35.9 C) (Temporal)   Resp 16   Ht 5\' 4"  (1.626 m)   Wt 153 lb (69.4 kg)   LMP 08/15/1978   SpO2 99%   BMI 26.26 kg/m  Wt Readings from Last 3 Encounters:  01/06/21 153 lb (69.4 kg)  10/29/20 150 lb (68 kg)  10/07/20 153 lb 3.2 oz (69.5 kg)     Lab Results  Component Value Date   WBC 6.3 10/07/2020   HGB 13.0 10/07/2020   HCT 40.1 10/07/2020   PLT 334.0 10/07/2020   GLUCOSE 80 01/06/2021   CHOL 262 (H) 01/06/2021   TRIG 239.0 (H) 01/06/2021   HDL 84.50 01/06/2021   LDLDIRECT 145.0 01/06/2021   LDLCALC 129 (H) 10/03/2020   ALT 12 01/06/2021   AST 21 01/06/2021   NA 138 01/06/2021   K 4.7 01/06/2021   CL 102 01/06/2021   CREATININE 1.54 (H) 01/06/2021   BUN 40 (H) 01/06/2021   CO2 25 01/06/2021   TSH 2.92 10/03/2020   INR 1.0 10/16/2013   HGBA1C 5.5 07/18/2019       Assessment & Plan:   Problem List Items Addressed This Visit    A-fib ([place]) -  Primary    Continue eliquis.  On metoprolol.  Dose being adjusted.  Continue f/u with cardiology.        Anemia    Follow cbc.       Relevant Medications   cyanocobalamin 1000 MCG tablet   Aortic aneurysm [facility])    Reports is seeing cardiology and they are following.        CKD (chronic kidney disease) stage 3, GFR 30-59 ml/min ([place])    Saw nephrology.  Avoid antiinflammatories.  Follow metabolic panel.       GERD (gastroesophageal reflux disease)    No upper symptoms reported.  On protonix.        Health care maintenance    Physical today 01/06/21.  Mammogram 07/11/20 - Birads I.  Colonoscopy 05/22/19 - tubular adenoma - hepatic flexure and internal hemorrhoids.  No f/u colonoscopy.        Hypercholesterolemia    Currently not on crestor.  She reports was told to stop by cardiology.  Low  cholesterol diet and exercise.  Follow lipid panel and liver function tests.  Discuss with cardiology regarding restarting cholesterol medication.  Follow.       Relevant Orders   Hepatic function panel (Completed)   Lipid panel (Completed)   Hypertension    Continue losartan and metoprolol.  Blood pressure doing well.  Follow pressures.  Follow metabolic panel.       Relevant Orders   Basic metabolic panel (Completed)   Sleep apnea    Continue cpap.       Stress    Overall appears to be handling things relatively well.  Discussed.  Follow.           Einar Pheasant, MD

## 2021-01-07 ENCOUNTER — Other Ambulatory Visit: Payer: Self-pay

## 2021-01-07 MED ORDER — ROSUVASTATIN CALCIUM 20 MG PO TABS: 20.0000 mg | ORAL_TABLET | Freq: Every day | ORAL | 1 refills | Status: DC

## 2021-01-09 ENCOUNTER — Ambulatory Visit (INDEPENDENT_AMBULATORY_CARE_PROVIDER_SITE_OTHER): Payer: Medicare Other

## 2021-01-09 DIAGNOSIS — L209 Atopic dermatitis, unspecified: Secondary | ICD-10-CM | POA: Diagnosis not present

## 2021-01-09 NOTE — Progress Notes (Signed)
Patient treated in the lightbox/NBUVB for 4 minutes and 22 seconds.

## 2021-01-10 ENCOUNTER — Other Ambulatory Visit: Payer: Self-pay

## 2021-01-10 MED ORDER — TACROLIMUS 0.1 % EX OINT: TOPICAL_OINTMENT | Freq: Two times a day (BID) | CUTANEOUS | 0 refills | Status: AC

## 2021-01-10 NOTE — Progress Notes (Signed)
[facility] requesting refills of Tacrolimus 0.1% ointment with three refills of a 90 day supply.

## 2021-01-12 ENCOUNTER — Encounter: Payer: Self-pay | Admitting: Internal Medicine

## 2021-01-12 NOTE — Assessment & Plan Note (Signed)
Continue losartan and metoprolol.  Blood pressure doing well.  Follow pressures.  Follow metabolic panel.

## 2021-01-12 NOTE — Assessment & Plan Note (Signed)
No upper symptoms reported.  On protonix.   

## 2021-01-12 NOTE — Assessment & Plan Note (Signed)
Overall appears to be handling things relatively well.  Discussed.  Follow.

## 2021-01-12 NOTE — Assessment & Plan Note (Signed)
Continue cpap.  

## 2021-01-12 NOTE — Assessment & Plan Note (Signed)
Saw nephrology.  Avoid antiinflammatories.  Follow metabolic panel.

## 2021-01-12 NOTE — Assessment & Plan Note (Addendum)
Currently not on crestor.  She reports was told to stop by cardiology.  Low cholesterol diet and exercise.  Follow lipid panel and liver function tests.  Discuss with cardiology regarding restarting cholesterol medication.  Follow.

## 2021-01-12 NOTE — Assessment & Plan Note (Signed)
Continue eliquis.  On metoprolol.  Dose being adjusted.  Continue f/u with cardiology.

## 2021-01-12 NOTE — Assessment & Plan Note (Signed)
Reports is seeing cardiology and they are following.

## 2021-01-12 NOTE — Assessment & Plan Note (Signed)
Follow cbc.  

## 2021-01-16 ENCOUNTER — Other Ambulatory Visit: Payer: Self-pay

## 2021-01-16 ENCOUNTER — Ambulatory Visit (INDEPENDENT_AMBULATORY_CARE_PROVIDER_SITE_OTHER): Payer: Medicare Other | Admitting: Dermatology

## 2021-01-16 DIAGNOSIS — L2089 Other atopic dermatitis: Secondary | ICD-10-CM | POA: Diagnosis not present

## 2021-01-16 MED ORDER — DUPILUMAB 300 MG/2ML ~~LOC~~ SOSY
300.0000 mg | PREFILLED_SYRINGE | Freq: Once | SUBCUTANEOUS | Status: AC
  Administered 2021-01-16: 300 mg via SUBCUTANEOUS

## 2021-01-16 NOTE — Progress Notes (Signed)
   Follow-Up Visit   Subjective  Leah Huber is a 80 y.o. female who presents for the following: Follow-up (Patient here today for 2 week Dupixent injection and dermatitis. Patient continues to have itching at arms and left leg but feels she is doing better overall. ).  Patient is also doing NVUVB once weekly and is using tacrolimus daily.   The following portions of the chart were reviewed this encounter and updated as appropriate:   Tobacco  Allergies  Meds  Problems  Med Hx  Surg Hx  Fam Hx      Review of Systems:  No other skin or systemic complaints except as noted in HPI or Assessment and Plan.  Objective  Well appearing patient in no apparent distress; mood and affect are within normal limits.  A focused examination was performed including arms, legs. Relevant physical exam findings are noted in the Assessment and Plan.  Objective  Right Forearm - Anterior: Few scattered scaly pink papules   Assessment & Plan  Other atopic dermatitis Right Forearm - Anterior  Chronic condition with duration or expected duration over one year. Condition is bothersome to patient. Not currently at goal.  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  Dupilumab (Dupixent) is a treatment given by injection for adults with moderate-to-severe atopic dermatitis. Goal is control of skin condition, not cure. It is given as 2 injections at the first dose followed by 1 injection ever 2 weeks thereafter.  Potential side effects include allergic reaction, herpes infections, injection site reactions and conjunctivitis (inflammation of the eyes).  The use of Dupixent requires long term medication management, including periodic office visits.  D/c tacrolimus  Start Opzelura 1.5% cream twice daily to itchy areas. If improving patient will let us know and we will send in rx. If not providing relief can send in clobetasol 0.05% foam twice daily for up to 2 weeks (can get at [place] with GoodRx coupon for around $35).   Continue Dupixent 300mg /68mL SQ Q2 weeks Dupixent 300mg /88mL injected to right upper arm today.  D/C NVUVB, restart as needed for itch.   Sample of Opzelura given to patient.  Lot # [id number] Exp Feb 2024   Return in about 2 weeks (around 01/30/2021) for [place].  Leah Huber, RMA, am acting as scribe for Leah Gleason, MD .  Documentation: I have reviewed the above documentation for accuracy and completeness, and I agree with the above.  Leah Gleason, MD

## 2021-01-16 NOTE — Patient Instructions (Addendum)
Dupilumab (Dupixent) is a treatment given by injection for adults with moderate-to-severe atopic dermatitis. Goal is control of skin condition, not cure. It is given as 2 injections at the first dose followed by 1 injection ever 2 weeks thereafter.  Potential side effects include allergic reaction, herpes infections, injection site reactions and conjunctivitis (inflammation of the eyes).  The use of Dupixent requires long term medication management, including periodic office visits.  Discontinue tacrolimus Start Opzelura 1.5% cream twice daily to itchy areas. If improving patient will let us know and we will send in rx. If not providing relief can send in clobetasol 0.05% foam twice daily for up to 2 weeks.  Recommend considering [place] with [facility] card for $35 co-pay. Discontinue NVUVB light treatment, can restart as needed for itch.    If you have any questions or concerns for your doctor, please call our main line at [phone] and press option 4 to reach your doctor's medical assistant. If no one answers, please leave a voicemail as directed and we will return your call as soon as possible. Messages left after 4 pm will be answered the following business day.   You may also send [place] a message via [place]. We typically respond to MyChart messages within 1-2 business days.  For prescription refills, please ask your pharmacy to contact our office. Our fax number is [phone].  If you have an urgent issue when the clinic is closed that cannot wait until the next business day, you can page your doctor at the number below.    Please note that while we do our best to be available for urgent issues outside of office hours, we are not available 24/7.   If you have an urgent issue and are unable to reach [place], you may choose to seek medical care at your doctor's office, retail clinic, urgent care center, or emergency room.  If you have a medical emergency, please immediately call 911 or  go to the emergency department.  Pager Numbers  - Dr. Nehemiah Massed: [phone]  - Dr. Laurence Ferrari: [phone]  - Dr. Nicole Kindred: [phone]  In the event of inclement weather, please call our main line at [phone] for an update on the status of any delays or closures.  Dermatology Medication Tips: Please keep the boxes that topical medications come in in order to help keep track of the instructions about where and how to use these. Pharmacies typically print the medication instructions only on the boxes and not directly on the medication tubes.   If your medication is too expensive, please contact our office at [phone] option 4 or send [place] a message through [place].   We are unable to tell what your co-pay for medications will be in advance as this is different depending on your insurance coverage. However, we may be able to find a substitute medication at lower cost or fill out paperwork to get insurance to cover a needed medication.   If a prior authorization is required to get your medication covered by your insurance company, please allow [place] 1-2 business days to complete this process.  Drug prices often vary depending on where the prescription is filled and some pharmacies may offer cheaper prices.  The website www.goodrx.com contains coupons for medications through different pharmacies. The prices here do not account for what the cost may be with help from insurance (it may be cheaper with your insurance), but the website can give you the price if you did not use any insurance.  -  You can print the associated coupon and take it with your prescription to the pharmacy.  - You may also stop by our office during regular business hours and pick up a GoodRx coupon card.  - If you need your prescription sent electronically to a different pharmacy, notify our office through [facility] or by phone at [phone] option 4.

## 2021-01-26 ENCOUNTER — Encounter: Payer: Self-pay | Admitting: Dermatology

## 2021-01-30 ENCOUNTER — Ambulatory Visit: Payer: Medicare Other

## 2021-02-04 ENCOUNTER — Other Ambulatory Visit: Payer: Self-pay

## 2021-02-04 ENCOUNTER — Ambulatory Visit (INDEPENDENT_AMBULATORY_CARE_PROVIDER_SITE_OTHER): Payer: Medicare Other

## 2021-02-04 DIAGNOSIS — L209 Atopic dermatitis, unspecified: Secondary | ICD-10-CM | POA: Diagnosis not present

## 2021-02-04 MED ORDER — DUPILUMAB 300 MG/2ML ~~LOC~~ SOSY
300.0000 mg | PREFILLED_SYRINGE | Freq: Once | SUBCUTANEOUS | Status: AC
  Administered 2021-02-04: 300 mg via SUBCUTANEOUS

## 2021-02-04 NOTE — Progress Notes (Signed)
Patient here for two week Dupixent injection.  Dupixent 300mg  injected into left upper arm. Patient tolerated well.  LOT: [id number] EXP: 07/2023

## 2021-02-14 ENCOUNTER — Telehealth: Payer: Self-pay | Admitting: *Deleted

## 2021-02-14 DIAGNOSIS — E7800 Pure hypercholesterolemia, unspecified: Secondary | ICD-10-CM

## 2021-02-14 NOTE — Telephone Encounter (Signed)
Order placed for f/u liver panel.  

## 2021-02-14 NOTE — Telephone Encounter (Signed)
Please place future orders for lab appt.  

## 2021-02-17 ENCOUNTER — Other Ambulatory Visit: Payer: Self-pay

## 2021-02-17 ENCOUNTER — Other Ambulatory Visit (INDEPENDENT_AMBULATORY_CARE_PROVIDER_SITE_OTHER): Payer: Medicare Other

## 2021-02-17 ENCOUNTER — Encounter: Payer: Self-pay | Admitting: Internal Medicine

## 2021-02-17 DIAGNOSIS — E7800 Pure hypercholesterolemia, unspecified: Secondary | ICD-10-CM

## 2021-02-17 LAB — HEPATIC FUNCTION PANEL
ALT: 12 U/L (ref 0–35)
AST: 22 U/L (ref 0–37)
Albumin: 4.5 g/dL (ref 3.5–5.2)
Alkaline Phosphatase: 51 U/L (ref 39–117)
Bilirubin, Direct: 0.1 mg/dL (ref 0.0–0.3)
Total Bilirubin: 0.6 mg/dL (ref 0.2–1.2)
Total Protein: 6.8 g/dL (ref 6.0–8.3)

## 2021-02-17 NOTE — Progress Notes (Unsigned)
Reason for Triage Call /in person:  Symptoms:Mild Dizziness upon standing  Pain Scale:none, location, 0, level of pain (1-10, 10 severe),  Vitals: BP _108/60_______ , Pulse _72_________,  02 Sats  ___97______, Respirations ____16_____, Temp ___96.8________,   Onset: X 3 weeks ago while taking Dronedarone 400 mg daily. Patient noticed dizziness upon standing, mentioned to son-in- law at dinner whom advised cardiologist and was advised to stop medication, since patient was in for labs and BP cuff at home broken she ask BP be taken. Since patient mentioned dizziness with  standing Orthostatics taken Lying BP 108/60 pulse 73. Sitting BP 106/80 pulse 62, Standing BP 90/60 pulse 64. Patient has HX of Afib and will see cardiology on Thursday 02/20/21. Patient has noted improvement in Dizziness since stopping medication last noted dizziness was 02/16/21, denies dizziness today.   Duration:X 3 weeks  Medications:Dronedarone 400 mg stopped 02/10/2021  Last seen for this problem: 11/22/20 with cardiology, has upcoming 02/20/21  Outcome: Spoke with PCP advised of symptoms and BP and all Medication DC , [place] to discharge from clinic unless patient wanted to be seen (patient declined visit) will follow up cardiology on 02/20/21 and will call office back any changes.

## 2021-02-17 NOTE — Progress Notes (Signed)
Please call and follow up with pt.  Confirm she is doing ok.  Has she checked her blood pressure since her visit here?  Any further dizziness?

## 2021-02-18 ENCOUNTER — Ambulatory Visit (INDEPENDENT_AMBULATORY_CARE_PROVIDER_SITE_OTHER): Payer: Medicare Other

## 2021-02-18 ENCOUNTER — Other Ambulatory Visit: Payer: Self-pay

## 2021-02-18 DIAGNOSIS — L209 Atopic dermatitis, unspecified: Secondary | ICD-10-CM | POA: Diagnosis not present

## 2021-02-18 MED ORDER — DUPILUMAB 300 MG/2ML ~~LOC~~ SOSY
300.0000 mg | PREFILLED_SYRINGE | Freq: Once | SUBCUTANEOUS | Status: AC
  Administered 2021-02-18: 300 mg via SUBCUTANEOUS

## 2021-02-18 NOTE — Progress Notes (Signed)
Patient is aware of below. 

## 2021-02-18 NOTE — Progress Notes (Signed)
Patient here for two week Dupixent injection.   Dupixent 300mg /39mL injected SQ into right arm. Patient tolerated well.   LOT: [id number] EXP: 07/2023

## 2021-02-18 NOTE — Progress Notes (Signed)
Patient has not checked her blood pressure since she left here. She had to order a new one and it should be in tomorrow. Patient says that she has had no dizziness since Sunday. She says she does not feel like her blood pressure is low because she feels really good today

## 2021-02-18 NOTE — Progress Notes (Signed)
Ok.  Thanks for the update.  Keep [place] posted.

## 2021-02-19 DIAGNOSIS — N1832 Chronic kidney disease, stage 3b: Secondary | ICD-10-CM | POA: Diagnosis not present

## 2021-02-19 DIAGNOSIS — I10 Essential (primary) hypertension: Secondary | ICD-10-CM | POA: Diagnosis not present

## 2021-02-19 DIAGNOSIS — N2581 Secondary hyperparathyroidism of renal origin: Secondary | ICD-10-CM | POA: Diagnosis not present

## 2021-02-19 DIAGNOSIS — D631 Anemia in chronic kidney disease: Secondary | ICD-10-CM | POA: Diagnosis not present

## 2021-02-19 DIAGNOSIS — R809 Proteinuria, unspecified: Secondary | ICD-10-CM | POA: Diagnosis not present

## 2021-02-20 DIAGNOSIS — I10 Essential (primary) hypertension: Secondary | ICD-10-CM | POA: Diagnosis not present

## 2021-02-20 DIAGNOSIS — I480 Paroxysmal atrial fibrillation: Secondary | ICD-10-CM | POA: Diagnosis not present

## 2021-02-20 DIAGNOSIS — E782 Mixed hyperlipidemia: Secondary | ICD-10-CM | POA: Diagnosis not present

## 2021-02-20 DIAGNOSIS — I2510 Atherosclerotic heart disease of native coronary artery without angina pectoris: Secondary | ICD-10-CM | POA: Diagnosis not present

## 2021-03-04 ENCOUNTER — Ambulatory Visit (INDEPENDENT_AMBULATORY_CARE_PROVIDER_SITE_OTHER): Payer: Medicare Other

## 2021-03-04 ENCOUNTER — Other Ambulatory Visit: Payer: Self-pay

## 2021-03-04 DIAGNOSIS — L209 Atopic dermatitis, unspecified: Secondary | ICD-10-CM | POA: Diagnosis not present

## 2021-03-04 MED ORDER — DUPILUMAB 300 MG/2ML ~~LOC~~ SOSY
300.0000 mg | PREFILLED_SYRINGE | Freq: Once | SUBCUTANEOUS | Status: AC
  Administered 2021-03-04: 300 mg via SUBCUTANEOUS

## 2021-03-04 NOTE — Progress Notes (Signed)
Patient here for two week Dupixent injection.  Dupixent 300mg  injected into the left arm. Patient tolerated well.  LOT: [id number] EXP: 07/09/2023

## 2021-03-16 DIAGNOSIS — Z20822 Contact with and (suspected) exposure to covid-19: Secondary | ICD-10-CM | POA: Diagnosis not present

## 2021-03-18 ENCOUNTER — Ambulatory Visit: Payer: Medicare Other

## 2021-03-20 ENCOUNTER — Ambulatory Visit (INDEPENDENT_AMBULATORY_CARE_PROVIDER_SITE_OTHER): Payer: Medicare Other

## 2021-03-20 ENCOUNTER — Other Ambulatory Visit: Payer: Self-pay

## 2021-03-20 DIAGNOSIS — L209 Atopic dermatitis, unspecified: Secondary | ICD-10-CM

## 2021-03-20 DIAGNOSIS — L2081 Atopic neurodermatitis: Secondary | ICD-10-CM

## 2021-03-20 MED ORDER — DUPILUMAB 300 MG/2ML ~~LOC~~ SOSY
300.0000 mg | PREFILLED_SYRINGE | Freq: Once | SUBCUTANEOUS | Status: AC
  Administered 2021-03-20: 300 mg via SUBCUTANEOUS

## 2021-03-20 NOTE — Progress Notes (Signed)
Injection given in right arm. Patient tolerated well with no reactions.

## 2021-04-03 ENCOUNTER — Other Ambulatory Visit: Payer: Self-pay

## 2021-04-03 ENCOUNTER — Ambulatory Visit (INDEPENDENT_AMBULATORY_CARE_PROVIDER_SITE_OTHER): Payer: Medicare Other

## 2021-04-03 DIAGNOSIS — L209 Atopic dermatitis, unspecified: Secondary | ICD-10-CM | POA: Diagnosis not present

## 2021-04-03 MED ORDER — DUPILUMAB 300 MG/2ML ~~LOC~~ SOSY
300.0000 mg | PREFILLED_SYRINGE | Freq: Once | SUBCUTANEOUS | Status: AC
  Administered 2021-04-03: 300 mg via SUBCUTANEOUS

## 2021-04-03 NOTE — Progress Notes (Signed)
Patient here for two week Dupixent injection.  Dupixent '300mg'$ /38m injected into left upper arm. Patient tolerated well.  LOT: [id number]: 09/2023

## 2021-04-17 ENCOUNTER — Other Ambulatory Visit: Payer: Self-pay

## 2021-04-17 ENCOUNTER — Ambulatory Visit (INDEPENDENT_AMBULATORY_CARE_PROVIDER_SITE_OTHER): Payer: Medicare Other

## 2021-04-17 DIAGNOSIS — L209 Atopic dermatitis, unspecified: Secondary | ICD-10-CM

## 2021-04-17 MED ORDER — DUPILUMAB 300 MG/2ML ~~LOC~~ SOSY
300.0000 mg | PREFILLED_SYRINGE | Freq: Once | SUBCUTANEOUS | Status: AC
  Administered 2021-04-17: 300 mg via SUBCUTANEOUS

## 2021-04-17 NOTE — Progress Notes (Signed)
Patient here for two week Dupixent injection.   Dupixent '300mg'$ /63m injected SQ into right arm. Patient tolerated well.   LOT:[id number]: 09/2023

## 2021-04-18 DIAGNOSIS — I480 Paroxysmal atrial fibrillation: Secondary | ICD-10-CM | POA: Diagnosis not present

## 2021-04-23 DIAGNOSIS — R0602 Shortness of breath: Secondary | ICD-10-CM | POA: Diagnosis not present

## 2021-04-23 DIAGNOSIS — I480 Paroxysmal atrial fibrillation: Secondary | ICD-10-CM | POA: Diagnosis not present

## 2021-04-24 ENCOUNTER — Telehealth: Payer: Self-pay

## 2021-04-24 ENCOUNTER — Telehealth: Payer: Self-pay | Admitting: Internal Medicine

## 2021-04-24 NOTE — Telephone Encounter (Signed)
See other note

## 2021-04-24 NOTE — Telephone Encounter (Signed)
Daughter Sharyn Lull called and states that Dr Nehemiah Massed in Cardiology saw pt yesterday. Pt is in Afib and he she is anemic. He wanted Dr Nicki Reaper to ask if she wants pt to get infusions or if there is anything else she needs? Please advise

## 2021-04-24 NOTE — Telephone Encounter (Signed)
PT called back wanting to speak to [place]

## 2021-04-24 NOTE — Telephone Encounter (Signed)
Spoke with pt . Pt stated her daughter would take her to ER for infusion. Provider requested I transfer call to her. Pt call was transferred to provider.

## 2021-04-24 NOTE — Telephone Encounter (Signed)
Please call Leah Huber.  I received a call from cardiology.  Labs - hgb 7.4.  please call and see how she  is doing.  Any bleeding?  Is she eating?  Any GI symptoms, etc?  When is f/u with nephrology?

## 2021-04-24 NOTE — Telephone Encounter (Signed)
Decreased eliquis. Stopped losartan and crestor. Heart ablasion scheduled for 9/14. F/u with nephrology 9/21. She is having SOB on exertion, some nausea and dizziness. No bleeding. Able to eat and drink ok. She has been drinking a lot of water to stay hydrated. Using cane to help with balance. Advised that she may need to go over to ED to be able to have iron infusion. She is wondering if there is any way to have this done to avoid the ED. Advised I would discuss with you and give her a call back and call Sharyn Lull as well.

## 2021-04-24 NOTE — Telephone Encounter (Signed)
I can work her in Architectural technologist.  See if she can come in at 7:00 tomorrow.

## 2021-04-25 ENCOUNTER — Telehealth: Payer: Self-pay | Admitting: *Deleted

## 2021-04-25 ENCOUNTER — Ambulatory Visit (INDEPENDENT_AMBULATORY_CARE_PROVIDER_SITE_OTHER): Payer: Medicare Other | Admitting: Internal Medicine

## 2021-04-25 ENCOUNTER — Other Ambulatory Visit: Payer: Self-pay

## 2021-04-25 VITALS — BP 122/80 | HR 88 | Temp 97.1°F | Resp 16 | Ht 64.0 in | Wt 152.6 lb

## 2021-04-25 DIAGNOSIS — R109 Unspecified abdominal pain: Secondary | ICD-10-CM | POA: Insufficient documentation

## 2021-04-25 DIAGNOSIS — Z860100 Personal history of colon polyps, unspecified: Secondary | ICD-10-CM

## 2021-04-25 DIAGNOSIS — D649 Anemia, unspecified: Secondary | ICD-10-CM | POA: Diagnosis not present

## 2021-04-25 DIAGNOSIS — I4891 Unspecified atrial fibrillation: Secondary | ICD-10-CM

## 2021-04-25 DIAGNOSIS — I712 Thoracic aortic aneurysm, without rupture, unspecified: Secondary | ICD-10-CM

## 2021-04-25 DIAGNOSIS — R0602 Shortness of breath: Secondary | ICD-10-CM

## 2021-04-25 DIAGNOSIS — I10 Essential (primary) hypertension: Secondary | ICD-10-CM | POA: Diagnosis not present

## 2021-04-25 DIAGNOSIS — G4730 Sleep apnea, unspecified: Secondary | ICD-10-CM

## 2021-04-25 DIAGNOSIS — F439 Reaction to severe stress, unspecified: Secondary | ICD-10-CM | POA: Diagnosis not present

## 2021-04-25 DIAGNOSIS — K219 Gastro-esophageal reflux disease without esophagitis: Secondary | ICD-10-CM

## 2021-04-25 DIAGNOSIS — E538 Deficiency of other specified B group vitamins: Secondary | ICD-10-CM

## 2021-04-25 DIAGNOSIS — N1832 Chronic kidney disease, stage 3b: Secondary | ICD-10-CM

## 2021-04-25 DIAGNOSIS — R1010 Upper abdominal pain, unspecified: Secondary | ICD-10-CM | POA: Diagnosis not present

## 2021-04-25 LAB — IBC + FERRITIN
Ferritin: 7.1 ng/mL — ABNORMAL LOW (ref 10.0–291.0)
Iron: 9 ug/dL — ABNORMAL LOW (ref 42–145)
Saturation Ratios: 1.6 % — ABNORMAL LOW (ref 20.0–50.0)
TIBC: 565.6 ug/dL — ABNORMAL HIGH (ref 250.0–450.0)
Transferrin: 404 mg/dL — ABNORMAL HIGH (ref 212.0–360.0)

## 2021-04-25 LAB — CBC WITH DIFFERENTIAL/PLATELET
Basophils Absolute: 0.1 10*3/uL (ref 0.0–0.1)
Basophils Relative: 0.7 % (ref 0.0–3.0)
Eosinophils Absolute: 0.1 10*3/uL (ref 0.0–0.7)
Eosinophils Relative: 0.7 % (ref 0.0–5.0)
HCT: 24.2 % — ABNORMAL LOW (ref 36.0–46.0)
Hemoglobin: 7.2 g/dL — CL (ref 12.0–15.0)
Lymphocytes Relative: 11.3 % — ABNORMAL LOW (ref 12.0–46.0)
Lymphs Abs: 1 10*3/uL (ref 0.7–4.0)
MCHC: 29.9 g/dL — ABNORMAL LOW (ref 30.0–36.0)
MCV: 92.4 fl (ref 78.0–100.0)
Monocytes Absolute: 0.6 10*3/uL (ref 0.1–1.0)
Monocytes Relative: 6.5 % (ref 3.0–12.0)
Neutro Abs: 7.3 10*3/uL (ref 1.4–7.7)
Neutrophils Relative %: 80.8 % — ABNORMAL HIGH (ref 43.0–77.0)
Platelets: 405 10*3/uL — ABNORMAL HIGH (ref 150.0–400.0)
RBC: 2.62 Mil/uL — ABNORMAL LOW (ref 3.87–5.11)
RDW: 15.5 % (ref 11.5–15.5)
WBC: 9.1 10*3/uL (ref 4.0–10.5)

## 2021-04-25 LAB — COMPREHENSIVE METABOLIC PANEL
ALT: 10 U/L (ref 0–35)
AST: 16 U/L (ref 0–37)
Albumin: 4.4 g/dL (ref 3.5–5.2)
Alkaline Phosphatase: 46 U/L (ref 39–117)
BUN: 30 mg/dL — ABNORMAL HIGH (ref 6–23)
CO2: 23 mEq/L (ref 19–32)
Calcium: 9.6 mg/dL (ref 8.4–10.5)
Chloride: 105 mEq/L (ref 96–112)
Creatinine, Ser: 1.45 mg/dL — ABNORMAL HIGH (ref 0.40–1.20)
GFR: 33.86 mL/min — ABNORMAL LOW (ref >60.00)
Glucose, Bld: 102 mg/dL — ABNORMAL HIGH (ref 70–99)
Potassium: 4 mEq/L (ref 3.5–5.1)
Sodium: 139 mEq/L (ref 135–145)
Total Bilirubin: 0.5 mg/dL (ref 0.2–1.2)
Total Protein: 7 g/dL (ref 6.0–8.3)

## 2021-04-25 LAB — LIPASE: Lipase: 30 U/L (ref 11.0–59.0)

## 2021-04-25 LAB — AMYLASE: Amylase: 45 U/L (ref 27–131)

## 2021-04-25 LAB — VITAMIN B12: Vitamin B-12: 924 pg/mL — ABNORMAL HIGH (ref 211–911)

## 2021-04-25 NOTE — Telephone Encounter (Signed)
CRITICAL VALUE STICKER  CRITICAL VALUE: Hgb: 7.2 (L)  RECEIVER (on-site recipient of call):  DATE & TIME NOTIFIED: 8/19 @ 10:35am  MESSENGER (representative from lab): Hope  MD NOTIFIED: Dr.Scott  TIME OF NOTIFICATION:10:50 am  RESPONSE:

## 2021-04-25 NOTE — Telephone Encounter (Signed)
Please call Ms Recio and let her know that her hgb is 7.2.  will forward to hematology.  Kidney function relatively stable.  Waiting for all lab results.

## 2021-04-25 NOTE — Telephone Encounter (Signed)
Per Dr. Jacinto Reap- Patient needs NP apt on 8/24 at 8:30 am with IV Venofer, no labs, dx: IDA. Referred by Dr. Nicki Reaper

## 2021-04-25 NOTE — Progress Notes (Signed)
Patient ID: Leah Huber, female   DOB: [birth year], 80 y.o.   MRN: [id number]   Subjective:    Patient ID: Leah Huber, female    DOB: [birth year], 80 y.o.   MRN: [id number]  HPI This visit occurred during the SARS-CoV-2 public health emergency.  Safety protocols were in place, including screening questions prior to the visit, additional usage of staff PPE, and extensive cleaning of exam room while observing appropriate contact time as indicated for disinfecting solutions.   Patient here for work in appt.  Work in for anemia.  Was seen by cardiology (Dr Nehemiah Massed) yesterday.  Reported sob. Also evaluated for f/u afib.  Planning for ablation - 05/21/21.  Evaluation yesterday revealed hgb 7.4.  kidney function stable.  She is accompanied by her daughter.  History obtained from both of them.  Reports some days better than others.  Reports intermittent dizziness and increased sob.  No syncope or near syncope.  No chest pain.  No increased cough or congestion.  Eating.  Some intermittent nausea.  Intermittent upper abdominal discomfort.  No pain today.    Past Medical History:  Diagnosis Date   Abnormal liver function test 07/23/2019   Anemia    Aortic aneurysm (HCC)    "SMALL"   Arthritis    neck to tailbone   Bilateral carpal tunnel syndrome 12/08/2017   Bronchitis    Chronic cough    @ NIGHT/ SEASONAL ALLERGIES   Clostridium difficile infection 2013-2014   Colon polyp    Concussion    Coronary artery disease    Dr Nehemiah Massed cardiologist   Depression    Diverticulosis    GERD (gastroesophageal reflux disease)    Glaucoma    Gout    Hemorrhoids    History of chicken pox    History of colonic polyps    History of shingles    Hypercholesterolemia    Hypertension    CONTROLLED ON MEDS   Hypovolemia 12/10   acute renal failure   IBS (irritable bowel syndrome)    Neuromuscular disorder (HCC)    occasionally tingling in hands/ NUMBNESS IN RIGHT FOOT s/p back surgery   Osteoarthritis     bilateral knees   Osteopenia    Pneumonia    HX OF   Sleep apnea    very mild   sleep study 8 yrs ago does not use CPAP   Urinary incontinence    Past Surgical History:  Procedure Laterality Date   ABDOMINAL HYSTERECTOMY  1979   ANTERIOR CERVICAL DECOMP/DISCECTOMY FUSION N/A 11/28/2014   Procedure: CERVICAL FIVE-SIX  ANTERIOR CERVICAL DECOMPRESSION/DISCECTOMY FUSION 1 LEVEL;  Surgeon: Newman Pies, MD;  Location: [facility];  Service: Neurosurgery;  Laterality: N/A;  C56 anterior cervical decompression with fusion interbody prosthesis plating and bonegraft   BACK SURGERY     X2/ L4,5 & S1/ Cervical C7?   BLADDER SURGERY  1981   SUSPENSION   BREAST REDUCTION SURGERY     CARDIOVERSION N/A 10/29/2020   Procedure: CARDIOVERSION;  Surgeon: Corey Skains, MD;  Location: [facility];  Service: Cardiovascular;  Laterality: N/A;   CARPAL TUNNEL RELEASE  2007   CATARACT EXTRACTION W/PHACO Right 12/30/2016   Procedure: CATARACT EXTRACTION PHACO AND INTRAOCULAR LENS PLACEMENT (IOC)  Right toric lens;  Surgeon: Leandrew Koyanagi, MD;  Location: [place];  Service: Ophthalmology;  Laterality: Right;  sleep apnea, does not use CPAP   CATARACT EXTRACTION W/PHACO Left 01/20/2017   Procedure: CATARACT EXTRACTION PHACO AND INTRAOCULAR  LENS PLACEMENT (IOC)  Left Toric;  Surgeon: Leandrew Koyanagi, MD;  Location: [place];  Service: Ophthalmology;  Laterality: Left;  toric lens   CHOLECYSTECTOMY  1996   COLONOSCOPY     COLONOSCOPY WITH PROPOFOL N/A 05/22/2019   Procedure: COLONOSCOPY WITH PROPOFOL;  Surgeon: [place], Benay Pike, MD;  Location: [facility];  Service: Gastroenterology;  Laterality: N/A;   FOOT SURGERY  2005   JOINT REPLACEMENT Bilateral    KNEE   [place] Bilateral 2001   ROTATOR CUFF REPAIR  2000 & 2004   left and right   SPINAL CORD STIMULATOR IMPLANT  03/1997   SPINAL CORD STIMULATOR REMOVAL  2007   Tendon  repair right shoulder     TONSILLECTOMY     TOTAL KNEE ARTHROPLASTY  10/2011 & 09/13   left and right   Family History  Problem Relation Age of Onset   Heart disease Mother    Stroke Mother    Hypertension Mother    Hyperlipidemia Father    Heart disease Father    Heart disease Brother        2 brothers   Alzheimer's disease Brother    Heart disease Sister        pacemaker   Breast cancer Daughter 59   Social History   Socioeconomic History   Marital status: Widowed    Spouse name: Not on file   Number of children: 2   Years of education: Not on file   Highest education level: Not on file  Occupational History   Not on file  Tobacco Use   Smoking status: Former    Packs/day: 1.00    Years: 20.00    Pack years: 20.00    Types: Cigarettes    Quit date: 09/07/1988    Years since quitting: 32.6   Smokeless tobacco: Never  Vaping Use   Vaping Use: Never used  Substance and Sexual Activity   Alcohol use: Yes    Alcohol/week: 2.0 standard drinks    Types: 2 Glasses of wine per week    Comment: occasional   Drug use: No   Sexual activity: Never  Other Topics Concern   Not on file  Social History Narrative   She is widowed, has two daughters.   Social Determinants of Health   Financial Resource Strain: Low Risk    Difficulty of Paying Living Expenses: Not hard at all  Food Insecurity: No Food Insecurity   Worried About Charity fundraiser in the Last Year: Never true   [place] in the Last Year: Never true  Transportation Needs: No Transportation Needs   Lack of Transportation (Medical): No   Lack of Transportation (Non-Medical): No  Physical Activity: Not on file  Stress: No Stress Concern Present   Feeling of Stress : Not at all  Social Connections: Unknown   Frequency of Communication with Friends and Family: More than three times a week   Frequency of Social Gatherings with Friends and Family: More than three times a week   Attends Religious  Services: More than 4 times per year   Active Member of [employer] or Organizations: Yes   Attends Music therapist: More than 4 times per year   Marital Status: Not on file     Review of Systems  Constitutional:  Negative for unexpected weight change.       Some fatigue.   HENT:  Negative for congestion  and sinus pressure.   Respiratory:  Negative for cough and chest tightness.        Some sob as outlined.   Cardiovascular:  Negative for chest pain and leg swelling.  Gastrointestinal:  Negative for diarrhea and vomiting.       Previously had some issues with constipation.  Intermittent nausea and upper abdominal discomfort as outlined.   Genitourinary:  Negative for difficulty urinating and dysuria.  Musculoskeletal:  Negative for joint swelling and myalgias.  Skin:  Negative for color change and rash.  Neurological:  Negative for headaches.       Intermittent dizziness as outlined.   Psychiatric/Behavioral:  Negative for agitation and dysphoric mood.       Objective:    Physical Exam Vitals reviewed.  Constitutional:      General: She is not in acute distress.    Appearance: Normal appearance.  HENT:     Head: Normocephalic and atraumatic.     Right Ear: External ear normal.     Left Ear: External ear normal.  Eyes:     General: No scleral icterus.       Right eye: No discharge.        Left eye: No discharge.     Conjunctiva/sclera: Conjunctivae normal.  Neck:     Thyroid: No thyromegaly.  Cardiovascular:     Rate and Rhythm: Normal rate and regular rhythm.  Pulmonary:     Effort: No respiratory distress.     Breath sounds: Normal breath sounds. No wheezing.  Abdominal:     General: Bowel sounds are normal.     Palpations: Abdomen is soft.     Tenderness: There is no abdominal tenderness.  Musculoskeletal:        General: No swelling or tenderness.     Cervical back: Neck supple. No tenderness.  Lymphadenopathy:     Cervical: No cervical adenopathy.   Skin:    Findings: No erythema or rash.  Neurological:     Mental Status: She is alert.  Psychiatric:        Mood and Affect: Mood normal.        Behavior: Behavior normal.    BP 122/80   Pulse 88   Temp (!) 97.1 F (36.2 C)   Resp 16   Wt 152 lb 9.6 oz (69.2 kg)   LMP 08/15/1978   SpO2 98%   BMI 26.19 kg/m  Wt Readings from Last 3 Encounters:  04/25/21 152 lb 9.6 oz (69.2 kg)  01/06/21 153 lb (69.4 kg)  10/29/20 150 lb (68 kg)    Outpatient Encounter Medications as of 04/25/2021  Medication Sig   metoprolol succinate (TOPROL-XL) 25 MG 24 hr tablet Take 25 mg by mouth daily.   cyanocobalamin 1000 MCG tablet Take 1,000 mcg by mouth.   DETROL LA 4 MG 24 hr capsule TAKE 1 CAPSULE DAILY   dupilumab (DUPIXENT) 300 MG/2ML prefilled syringe Inject 300 mg into the skin every 14 (fourteen) days. Starting at day 15 for maintenance. (Patient taking differently: Inject 600 mg into the skin every 14 (fourteen) days. Starting at day 15 for maintenance.)   ELIQUIS 2.5 MG TABS tablet Take 2.5 mg by mouth 2 (two) times daily.   latanoprost (XALATAN) 0.005 % ophthalmic solution Place 1 drop into both eyes at bedtime.    Lifitegrast (XIIDRA) 5 % SOLN Place 1 drop into both eyes daily.   metoprolol succinate (TOPROL-XL) 50 MG 24 hr tablet Take 50 mg by mouth at bedtime.  Take with or immediately following a meal.   pantoprazole (PROTONIX) 40 MG tablet TAKE 1 TABLET TWICE A DAY (Patient taking differently: Take 40 mg by mouth daily.)   [DISCONTINUED] apixaban (ELIQUIS) 5 MG TABS tablet Take 5 mg by mouth 2 (two) times daily.   [DISCONTINUED] dronedarone (MULTAQ) 400 MG tablet Take 400 mg by mouth 2 (two) times daily.   [DISCONTINUED] losartan (COZAAR) 50 MG tablet TAKE 1 TABLET DAILY (Patient taking differently: Take 50 mg by mouth daily.)   [DISCONTINUED] mupirocin ointment (BACTROBAN) 2 % Apply to affected areas twice a day for 3 days, then once a day along with Triamcinolone. (Patient taking  differently: Apply 1 application topically See admin instructions. Apply to affected areas twice a day for 3 days, then once a day along with Triamcinolone.)   [DISCONTINUED] rosuvastatin (CRESTOR) 20 MG tablet Take 1 tablet (20 mg total) by mouth daily.   [DISCONTINUED] tacrolimus (PROTOPIC) 0.1 % ointment Apply topically daily. (Patient taking differently: Apply 1 application topically daily.)   No facility-administered encounter medications on file as of 04/25/2021.     Lab Results  Component Value Date   WBC 9.1 04/25/2021   HGB 7.2 cL (LL) 04/25/2021   HCT 24.2 aL (L) 04/25/2021   PLT 405.0 (H) 04/25/2021   GLUCOSE 102 (H) 04/25/2021   CHOL 262 (H) 01/06/2021   TRIG 239.0 (H) 01/06/2021   HDL 84.50 01/06/2021   LDLDIRECT 145.0 01/06/2021   LDLCALC 129 (H) 10/03/2020   ALT 10 04/25/2021   AST 16 04/25/2021   NA 139 04/25/2021   K 4.0 04/25/2021   CL 105 04/25/2021   CREATININE 1.45 (H) 04/25/2021   BUN 30 (H) 04/25/2021   CO2 23 04/25/2021   TSH 2.92 10/03/2020   INR 1.0 10/16/2013   HGBA1C 5.5 07/18/2019       Assessment & Plan:   Problem List Items Addressed This Visit     A-fib ([place])    On metoprolol.  On eliquis - dose just adjusted.  Planning for ablation in September.        Relevant Medications   ELIQUIS 2.5 MG TABS tablet   Abdominal pain   Relevant Orders   Amylase (Completed)   Lipase (Completed)   Anemia - Primary    Significant decrease in hemoglobin on yesterday's lab check.  Hemoglobin 7.4.  No active bleeding.  Unclear etiology.  Had recent colonoscopy September 2020 as outlined.  She is eating.  No significant dietary changes.  She does have known chronic kidney disease.  Kidney function is relatively stable.  Is followed by nephrology.  Discussed with hematology.  Will obtain labs to check iron studies, B12 level, serum protein electrophoresis, haptoglobin, LDH, etc.  Hematology planning to work in next week.  Pending results may need iron infusion.   Discussed further work-up and evaluation.  We will start with lab work first and then further work-up pending.  Discussed possibility of obtaining CT scan.      Relevant Orders   CBC with Differential/Platelet (Completed)   Multiple Myeloma Panel (SPEP&IFE w/QIG)   Comprehensive metabolic panel (Completed)   Kappa/lambda light chains   Haptoglobin (Completed)   IBC + Ferritin (Completed)   Lactate dehydrogenase (Completed)   Reticulocytes (Completed)   Pathologist smear review   Aortic aneurysm ([place])    Followed by cardiology.       Relevant Medications   ELIQUIS 2.5 MG TABS tablet   B12 deficiency   Relevant Orders   Vitamin  B12 (Completed)   CKD (chronic kidney disease) stage 3, GFR 30-59 ml/min (HCC)    Chronic and stable Recheck metabolic panel today.  Avoid nephrotoxic meds Seeing nephrology.       GERD (gastroesophageal reflux disease)    No upper symptoms reported.  On protonix.        History of colonic polyps    Colonoscopy September 2020-1 tubular adenomatous polyp (hepatic flexure), diverticulosis and internal hemorrhoids.      Hypertension    Off losartan.  On metoprolol.  Blood pressures 758 systolic lying and 832 standing.  No dizziness today.  Continue metoprolol as she is doing.  Follow pressures.  Follow metabolic panel.  Recheck today.       Relevant Medications   ELIQUIS 2.5 MG TABS tablet   Sleep apnea    Continue cpap.       SOB (shortness of breath) on exertion    Feel like the shortness of breath is probably multifactorial.  We discussed that her decreased hemoglobin could be contributing.  Also with intermittent episodes of atrial fibrillation.  Breathing better today.  No chest pain.  Planning for ablation in September.      Stress    Increased stress related to her current medical issues.  Discussed.  Feels better today.  Follow.         Einar Pheasant, MD

## 2021-04-25 NOTE — Telephone Encounter (Signed)
Patient was seen this AM.

## 2021-04-25 NOTE — Telephone Encounter (Signed)
Patient is aware of below and also advised that hematology would be calling her with an appointment

## 2021-04-26 LAB — RETICULOCYTES
ABS Retic: 65500 cells/uL (ref 20000–80000)
Retic Ct Pct: 2.5 %

## 2021-04-26 LAB — LACTATE DEHYDROGENASE: LDH: 215 U/L (ref 120–250)

## 2021-04-27 ENCOUNTER — Encounter: Payer: Self-pay | Admitting: Internal Medicine

## 2021-04-27 DIAGNOSIS — R0602 Shortness of breath: Secondary | ICD-10-CM | POA: Insufficient documentation

## 2021-04-27 NOTE — Assessment & Plan Note (Signed)
On metoprolol.  On eliquis - dose just adjusted.  Planning for ablation in September.

## 2021-04-27 NOTE — Assessment & Plan Note (Signed)
Significant decrease in hemoglobin on yesterday's lab check.  Hemoglobin 7.4.  No active bleeding.  Unclear etiology.  Had recent colonoscopy September 2020 as outlined.  She is eating.  No significant dietary changes.  She does have known chronic kidney disease.  Kidney function is relatively stable.  Is followed by nephrology.  Discussed with hematology.  Will obtain labs to check iron studies, B12 level, serum protein electrophoresis, haptoglobin, LDH, etc.  Hematology planning to work in next week.  Pending results may need iron infusion.  Discussed further work-up and evaluation.  We will start with lab work first and then further work-up pending.  Discussed possibility of obtaining CT scan.

## 2021-04-27 NOTE — Assessment & Plan Note (Signed)
Off losartan.  On metoprolol.  Blood pressures [id number] systolic lying and [id number] standing.  No dizziness today.  Continue metoprolol as she is doing.  Follow pressures.  Follow metabolic panel.  Recheck today.

## 2021-04-27 NOTE — Assessment & Plan Note (Signed)
Continue cpap.  

## 2021-04-27 NOTE — Assessment & Plan Note (Signed)
Chronic and stable Recheck metabolic panel today.  Avoid nephrotoxic meds Seeing nephrology.

## 2021-04-27 NOTE — Assessment & Plan Note (Signed)
Feel like the shortness of breath is probably multifactorial.  We discussed that her decreased hemoglobin could be contributing.  Also with intermittent episodes of atrial fibrillation.  Breathing better today.  No chest pain.  Planning for ablation in September.

## 2021-04-27 NOTE — Assessment & Plan Note (Signed)
No upper symptoms reported.  On protonix.   

## 2021-04-27 NOTE — Assessment & Plan Note (Signed)
Followed by cardiology 

## 2021-04-27 NOTE — Assessment & Plan Note (Signed)
Increased stress related to her current medical issues.  Discussed.  Feels better today.  Follow.

## 2021-04-27 NOTE — Assessment & Plan Note (Signed)
Colonoscopy September 2020-1 tubular adenomatous polyp (hepatic flexure), diverticulosis and internal hemorrhoids. 

## 2021-04-28 ENCOUNTER — Encounter: Payer: Self-pay | Admitting: Internal Medicine

## 2021-04-28 LAB — PATHOLOGIST SMEAR REVIEW

## 2021-04-28 LAB — KAPPA/LAMBDA LIGHT CHAINS
Kappa free light chain: 22.7 mg/L — ABNORMAL HIGH (ref 3.3–19.4)
Kappa:Lambda Ratio: 1.73 — ABNORMAL HIGH (ref 0.26–1.65)
Lambda Free Lght Chn: 13.1 mg/L (ref 5.7–26.3)

## 2021-04-28 LAB — HAPTOGLOBIN: Haptoglobin: 217 mg/dL — ABNORMAL HIGH (ref 43–212)

## 2021-04-30 ENCOUNTER — Encounter: Payer: Self-pay | Admitting: Internal Medicine

## 2021-04-30 ENCOUNTER — Inpatient Hospital Stay: Payer: Medicare Other | Attending: Internal Medicine | Admitting: Internal Medicine

## 2021-04-30 ENCOUNTER — Inpatient Hospital Stay: Payer: Medicare Other

## 2021-04-30 ENCOUNTER — Other Ambulatory Visit: Payer: Self-pay

## 2021-04-30 VITALS — BP 122/84 | HR 78 | Temp 97.0°F | Resp 18

## 2021-04-30 VITALS — BP 116/67 | HR 83 | Temp 97.6°F | Resp 20

## 2021-04-30 DIAGNOSIS — N183 Chronic kidney disease, stage 3 unspecified: Secondary | ICD-10-CM | POA: Insufficient documentation

## 2021-04-30 DIAGNOSIS — I4891 Unspecified atrial fibrillation: Secondary | ICD-10-CM | POA: Diagnosis not present

## 2021-04-30 DIAGNOSIS — E611 Iron deficiency: Secondary | ICD-10-CM

## 2021-04-30 DIAGNOSIS — Z7901 Long term (current) use of anticoagulants: Secondary | ICD-10-CM | POA: Diagnosis not present

## 2021-04-30 DIAGNOSIS — Z87891 Personal history of nicotine dependence: Secondary | ICD-10-CM | POA: Diagnosis not present

## 2021-04-30 DIAGNOSIS — D649 Anemia, unspecified: Secondary | ICD-10-CM

## 2021-04-30 DIAGNOSIS — D509 Iron deficiency anemia, unspecified: Secondary | ICD-10-CM | POA: Insufficient documentation

## 2021-04-30 MED ORDER — SODIUM CHLORIDE 0.9 % IV SOLN: 200.0000 mg | Freq: Once | INTRAVENOUS | Status: DC

## 2021-04-30 MED ORDER — IRON SUCROSE 20 MG/ML IV SOLN
200.0000 mg | Freq: Once | INTRAVENOUS | Status: AC
  Administered 2021-04-30: 200 mg via INTRAVENOUS
  Filled 2021-04-30: qty 10

## 2021-04-30 MED ORDER — SODIUM CHLORIDE 0.9 % IV SOLN
Freq: Once | INTRAVENOUS | Status: AC
  Filled 2021-04-30: qty 250

## 2021-04-30 NOTE — Progress Notes (Signed)
[place] NOTE  Patient Care Team: Einar Pheasant, MD as PCP - General (Internal Medicine) Efrain Sella, MD as Consulting Physician (Gastroenterology) Cammie Sickle, MD as Consulting Physician (Internal Medicine)  CHIEF COMPLAINTS/PURPOSE OF CONSULTATION: ANEMIA  HEMATOLOGY HISTORY:  # AUG 2022:  SEVERE ANEMIA [hb-7; ]     # EGD-; colonoscopy- ; capsule-  # CKD stage III [Dr.Korrapti]; A.fib [on eliquis on 2.5 mg BID;  Dr.Kowalski]    HISTORY OF PRESENTING ILLNESS:  Leah Huber 80 y.o.  female has been referred to [place] for further evaluation/work-up for anemia.  Patient has a history of chronic mild anemia hemoglobin less than attributable to CKD.  However, given worsening shortness of breath/especially exertion/worsening fatigue-further work-up showed hemoglobin around 7-8.  Work-up with PCP showed-iron deficiency.   Blood in stools: None Change in bowel habits- diarrhea/constipation [Hx of IBS- "years"]; more recently constipation; intermittent upper abmodnla pain.  Blood in urine: None Difficulty swallowing: None Abnormal weight loss: None Iron supplementation:NONE Prior Blood transfusions: NONE Bariatric surgery: None EGD/Colonoscopy:2020- last colo-23m [[facility]; Dr.Toledo]; NO EGD.   Vaginal bleeding: None Review of Systems  Constitutional:  Positive for malaise/fatigue. Negative for chills, diaphoresis, fever and weight loss.  HENT:  Negative for nosebleeds and sore throat.   Eyes:  Negative for double vision.  Respiratory:  Positive for shortness of breath. Negative for cough, hemoptysis, sputum production and wheezing.   Cardiovascular:  Negative for chest pain, palpitations, orthopnea and leg swelling.  Gastrointestinal:  Positive for abdominal pain, constipation, diarrhea and nausea. Negative for blood in stool, heartburn, melena and vomiting.  Genitourinary:  Negative for dysuria, frequency and urgency.  Musculoskeletal:   Positive for back pain and joint pain.  Skin: Negative.  Negative for itching and rash.  Neurological:  Positive for dizziness. Negative for tingling, focal weakness, weakness and headaches.  Endo/Heme/Allergies:  Does not bruise/bleed easily.  Psychiatric/Behavioral:  Negative for depression. The patient is not nervous/anxious and does not have insomnia.    MEDICAL HISTORY:  Past Medical History:  Diagnosis Date   Abnormal liver function test 07/23/2019   Anemia    Aortic aneurysm (HCC)    "SMALL"   Arthritis    neck to tailbone   Bilateral carpal tunnel syndrome 12/08/2017   Bronchitis    Chronic cough    @ NIGHT/ SEASONAL ALLERGIES   Clostridium difficile infection 2013-2014   Colon polyp    Concussion    Coronary artery disease    Dr KNehemiah Massedcardiologist   Depression    Diverticulosis    GERD (gastroesophageal reflux disease)    Glaucoma    Gout    Hemorrhoids    History of chicken pox    History of colonic polyps    History of shingles    Hypercholesterolemia    Hypertension    CONTROLLED ON MEDS   Hypovolemia 12/10   acute renal failure   IBS (irritable bowel syndrome)    Neuromuscular disorder (HCC)    occasionally tingling in hands/ NUMBNESS IN RIGHT FOOT s/p back surgery   Osteoarthritis    bilateral knees   Osteopenia    Pneumonia    HX OF   Sleep apnea    very mild   sleep study 8 yrs ago does not use CPAP   Urinary incontinence     SURGICAL HISTORY: Past Surgical History:  Procedure Laterality Date   ABDOMINAL HYSTERECTOMY  1979   ANTERIOR CERVICAL DECOMP/DISCECTOMY FUSION N/A 11/28/2014   Procedure:  CERVICAL FIVE-SIX  ANTERIOR CERVICAL DECOMPRESSION/DISCECTOMY FUSION 1 LEVEL;  Surgeon: Newman Pies, MD;  Location: [place] [facility];  Service: Neurosurgery;  Laterality: N/A;  C56 anterior cervical decompression with fusion interbody prosthesis plating and bonegraft   BACK SURGERY     X2/ L4,5 & S1/ Cervical C7?   BLADDER SURGERY  1981   SUSPENSION    BREAST REDUCTION SURGERY     CARDIOVERSION N/A 10/29/2020   Procedure: CARDIOVERSION;  Surgeon: Corey Skains, MD;  Location: [facility];  Service: Cardiovascular;  Laterality: N/A;   CARPAL TUNNEL RELEASE  2007   CATARACT EXTRACTION W/PHACO Right 12/30/2016   Procedure: CATARACT EXTRACTION PHACO AND INTRAOCULAR LENS PLACEMENT (I[facility])  Right toric lens;  Surgeon: Leandrew Koyanagi, MD;  Location: [place];  Service: Ophthalmology;  Laterality: Right;  sleep apnea, does not use CPAP   CATARACT EXTRACTION W/PHACO Left 01/20/2017   Procedure: CATARACT EXTRACTION PHACO AND INTRAOCULAR LENS PLACEMENT ([place])  Left Toric;  Surgeon: Leandrew Koyanagi, MD;  Location: [place];  Service: Ophthalmology;  Laterality: Left;  toric lens   CHOLECYSTECTOMY  1996   COLONOSCOPY     COLONOSCOPY WITH PROPOFOL N/A 05/22/2019   Procedure: COLONOSCOPY WITH PROPOFOL;  Surgeon: Toledo, Benay Pike, MD;  Location: [facility];  Service: Gastroenterology;  Laterality: N/A;   FOOT SURGERY  2005   JOINT REPLACEMENT Bilateral    KNEE   [place] Bilateral 2001   ROTATOR CUFF REPAIR  2000 & 2004   left and right   SPINAL CORD STIMULATOR IMPLANT  03/1997   SPINAL CORD STIMULATOR REMOVAL  2007   Tendon repair right shoulder     TONSILLECTOMY     TOTAL KNEE ARTHROPLASTY  10/2011 & 09/13   left and right    SOCIAL HISTORY: Social History   Socioeconomic History   Marital status: Widowed    Spouse name: Not on file   Number of children: 2   Years of education: Not on file   Highest education level: Not on file  Occupational History   Not on file  Tobacco Use   Smoking status: Former    Packs/day: 1.00    Years: 20.00    Pack years: 20.00    Types: Cigarettes    Quit date: 09/07/1988    Years since quitting: 32.7   Smokeless tobacco: Never  Vaping Use   Vaping Use: Never used  Substance and Sexual Activity   Alcohol use: Yes    Alcohol/week:  2.0 standard drinks    Types: 2 Glasses of wine per week    Comment: occasional   Drug use: No   Sexual activity: Never  Other Topics Concern   Not on file  Social History Narrative   She is widowed, has two daughters. [place]; quit smoking- in 1990s; social alcohol.    Social Determinants of Health   Financial Resource Strain: Not on file  Food Insecurity: Not on file  Transportation Needs: Not on file  Physical Activity: Not on file  Stress: Not on file  Social Connections: Not on file  Intimate Partner Violence: Not on file    FAMILY HISTORY: Family History  Problem Relation Age of Onset   Heart disease Mother    Stroke Mother    Hypertension Mother    Hyperlipidemia Father    Heart disease Father    Heart disease Brother        2 brothers   Alzheimer's disease Brother  Heart disease Sister        pacemaker   Breast cancer Daughter 14    ALLERGIES:  is allergic to clindamycin/lincomycin, dronedarone, bactrim [sulfamethoxazole-trimethoprim], morphine, morphine and related, and sulfa antibiotics.  MEDICATIONS:  Current Outpatient Medications  Medication Sig Dispense Refill   cyanocobalamin 1000 MCG tablet Take 1,000 mcg by mouth.     DETROL LA 4 MG 24 hr capsule TAKE 1 CAPSULE DAILY 90 capsule 3   dupilumab (DUPIXENT) 300 MG/2ML prefilled syringe Inject 300 mg into the skin every 14 (fourteen) days. Starting at day 15 for maintenance. (Patient taking differently: Inject 600 mg into the skin every 14 (fourteen) days. Starting at day 15 for maintenance.) 4 mL 11   ELIQUIS 2.5 MG TABS tablet Take 2.5 mg by mouth 2 (two) times daily.     latanoprost (XALATAN) 0.005 % ophthalmic solution Place 1 drop into both eyes at bedtime.      Lifitegrast (XIIDRA) 5 % SOLN Place 1 drop into both eyes daily.     metoprolol succinate (TOPROL-XL) 25 MG 24 hr tablet Take 25 mg by mouth daily.     metoprolol succinate (TOPROL-XL) 50 MG 24 hr tablet Take 50 mg by mouth at bedtime. Take  with or immediately following a meal.     pantoprazole (PROTONIX) 40 MG tablet TAKE 1 TABLET TWICE A DAY (Patient taking differently: Take 40 mg by mouth 2 (two) times daily.) 180 tablet 3   No current facility-administered medications for this visit.      PHYSICAL EXAMINATION:   Vitals:   04/30/21 0845  BP: 116/67  Pulse: 83  Resp: 20  Temp: 97.6 F (36.4 C)   There were no vitals filed for this visit.  Physical Exam Vitals and nursing note reviewed.  HENT:     Head: Normocephalic and atraumatic.     Mouth/Throat:     Pharynx: Oropharynx is clear.  Eyes:     Extraocular Movements: Extraocular movements intact.     Pupils: Pupils are equal, round, and reactive to light.  Cardiovascular:     Rate and Rhythm: Normal rate and regular rhythm.  Pulmonary:     Comments: Decreased breath sounds bilaterally.  Abdominal:     Palpations: Abdomen is soft.  Musculoskeletal:        General: Normal range of motion.     Cervical back: Normal range of motion.  Skin:    General: Skin is warm.  Neurological:     General: No focal deficit present.     Mental Status: She is alert and oriented to person, place, and time.  Psychiatric:        Behavior: Behavior normal.        Judgment: Judgment normal.   LABORATORY DATA:  I have reviewed the data as listed Lab Results  Component Value Date   WBC 4.9 05/16/2021   HGB 9.0 (L) 05/16/2021   HCT 29.8 (L) 05/16/2021   MCV 96.8 05/16/2021   PLT 354 05/16/2021   Recent Labs    10/03/20 0922 10/07/20 1018 10/21/20 1049 01/06/21 1409 02/17/21 1111 04/25/21 0759  NA 140   < > 137 138  --  139  K 4.5   < > 5.0 4.7  --  4.0  CL 105   < > 102 102  --  105  CO2 27   < > 24 25  --  23  GLUCOSE 76   < > 95 80  --  102*  BUN 30*   < >  35* 40*  --  30*  CREATININE 1.39*   < > 1.88* 1.54*  --  1.45*  CALCIUM 9.8   < > 10.0 9.5  --  9.6  PROT 6.6  --   --  6.6 6.8 6.7  7.0  ALBUMIN 4.4  --   --  4.5 4.5 4.4  AST 21  --   --  '21 22  16  '$ ALT 11  --   --  '12 12 10  '$ ALKPHOS 48  --   --  61 51 46  BILITOT 0.5  --   --  0.8 0.6 0.5  BILIDIR 0.1  --   --  0.1 0.1  --    < > = values in this interval not displayed.     No results found.  Iron deficiency # Anemia-hb- 7.2;  iron deficiency; symptomatic anemia.  # Patient is symptomatic from worsening fatigue-recommend IV iron infusions. Discussed the potential acute infusion reactions with IV iron; which are quite rare.  Patient understands the risk; will proceed with infusions.  #Etiology: Question CKD-III versus question GI loss.  Refer to Dr. Alice Reichert.  Etiology is unclear.  # A.fib on Eliquis.   Thank you, Dr.Scott for allowing me to participate in the care of your pleasant patient. Please do not hesitate to contact me with questions or concerns in the interim.  # DISPOSITION: # referral to Dr.Toleda re: anemia # venofer today; venofer 8/26 # next week- twice weekly- venofer x 2 doses # follow up on sep 9th- MD; labs- cbc/LDH; Possible venofer-Dr.B     All questions were answered. The patient knows to call the clinic with any problems, questions or concerns.      Cammie Sickle, MD 06/04/2021 9:58 AM

## 2021-04-30 NOTE — Assessment & Plan Note (Addendum)
#   Anemia-hb- 7.2;  iron deficiency; symptomatic anemia.  # Patient is symptomatic from worsening fatigue-recommend IV iron infusions. Discussed the potential acute infusion reactions with IV iron; which are quite rare.  Patient understands the risk; will proceed with infusions.  #Etiology: Question CKD-III versus question GI loss.  Refer to Dr. Alice Reichert.  Etiology is unclear.  # A.fib on Eliquis.   Thank you, Dr.Scott for allowing me to participate in the care of your pleasant patient. Please do not hesitate to contact me with questions or concerns in the interim.  # DISPOSITION: # referral to Dr.Toleda re: anemia # venofer today; venofer 8/26 # next week- twice weekly- venofer x 2 doses # follow up on sep 9th- MD; labs- cbc/LDH; Possible venofer-Dr.B

## 2021-04-30 NOTE — Patient Instructions (Signed)

## 2021-05-01 ENCOUNTER — Ambulatory Visit (INDEPENDENT_AMBULATORY_CARE_PROVIDER_SITE_OTHER): Payer: Medicare Other

## 2021-05-01 DIAGNOSIS — L209 Atopic dermatitis, unspecified: Secondary | ICD-10-CM

## 2021-05-01 LAB — MULTIPLE MYELOMA PANEL, SERUM
Albumin SerPl Elph-Mcnc: 4 g/dL (ref 2.9–4.4)
Albumin/Glob SerPl: 1.5 (ref 0.7–1.7)
Alpha 1: 0.3 g/dL (ref 0.0–0.4)
Alpha2 Glob SerPl Elph-Mcnc: 0.9 g/dL (ref 0.4–1.0)
B-Globulin SerPl Elph-Mcnc: 1 g/dL (ref 0.7–1.3)
Gamma Glob SerPl Elph-Mcnc: 0.5 g/dL (ref 0.4–1.8)
Globulin, Total: 2.7 g/dL (ref 2.2–3.9)
IgA/Immunoglobulin A, Serum: 127 mg/dL (ref 64–422)
IgG (Immunoglobin G), Serum: 590 mg/dL (ref 586–1602)
IgM (Immunoglobulin M), Srm: 42 mg/dL (ref 26–217)
Total Protein: 6.7 g/dL (ref 6.0–8.5)

## 2021-05-01 MED ORDER — DUPILUMAB 300 MG/2ML ~~LOC~~ SOSY
300.0000 mg | PREFILLED_SYRINGE | Freq: Once | SUBCUTANEOUS | Status: AC
  Administered 2021-05-01: 300 mg via SUBCUTANEOUS

## 2021-05-01 NOTE — Telephone Encounter (Signed)
Patient saw hematology 8/24. Has another iron infusion tomorrow. Will continue to follow with Dr Rogue Bussing

## 2021-05-01 NOTE — Progress Notes (Signed)
Patient here for two week Dupixent injection.   Dupixent '300mg'$ /65m injected SQ into left arm. Patient tolerated well.   LOT: [id number]: 10/2023

## 2021-05-02 ENCOUNTER — Inpatient Hospital Stay: Payer: Medicare Other

## 2021-05-02 VITALS — BP 114/79 | HR 73 | Temp 96.2°F | Resp 18

## 2021-05-02 DIAGNOSIS — D509 Iron deficiency anemia, unspecified: Secondary | ICD-10-CM | POA: Diagnosis not present

## 2021-05-02 DIAGNOSIS — E611 Iron deficiency: Secondary | ICD-10-CM

## 2021-05-02 DIAGNOSIS — N183 Chronic kidney disease, stage 3 unspecified: Secondary | ICD-10-CM | POA: Diagnosis not present

## 2021-05-02 MED ORDER — SODIUM CHLORIDE 0.9 % IV SOLN
Freq: Once | INTRAVENOUS | Status: AC
  Filled 2021-05-02: qty 250

## 2021-05-02 MED ORDER — IRON SUCROSE 20 MG/ML IV SOLN
200.0000 mg | Freq: Once | INTRAVENOUS | Status: AC
  Administered 2021-05-02: 200 mg via INTRAVENOUS
  Filled 2021-05-02: qty 10

## 2021-05-02 MED ORDER — SODIUM CHLORIDE 0.9 % IV SOLN: 200.0000 mg | Freq: Once | INTRAVENOUS | Status: DC

## 2021-05-02 NOTE — Patient Instructions (Signed)

## 2021-05-02 NOTE — Progress Notes (Signed)
Pt tolerated venofer infusion well today with no problems or complaints. Pt left infusion suite stable and ambulatory with her cane.

## 2021-05-07 ENCOUNTER — Inpatient Hospital Stay: Payer: Medicare Other

## 2021-05-07 ENCOUNTER — Ambulatory Visit: Payer: Medicare Other

## 2021-05-07 VITALS — BP 135/85 | HR 72 | Temp 95.8°F | Resp 75

## 2021-05-07 DIAGNOSIS — E611 Iron deficiency: Secondary | ICD-10-CM

## 2021-05-07 DIAGNOSIS — N183 Chronic kidney disease, stage 3 unspecified: Secondary | ICD-10-CM | POA: Diagnosis not present

## 2021-05-07 DIAGNOSIS — D509 Iron deficiency anemia, unspecified: Secondary | ICD-10-CM | POA: Diagnosis not present

## 2021-05-07 MED ORDER — SODIUM CHLORIDE 0.9 % IV SOLN: 200.0000 mg | Freq: Once | INTRAVENOUS | Status: DC

## 2021-05-07 MED ORDER — SODIUM CHLORIDE 0.9 % IV SOLN
Freq: Once | INTRAVENOUS | Status: AC
Start: 2021-05-07 — End: 2021-05-07
  Filled 2021-05-07: qty 250

## 2021-05-07 MED ORDER — IRON SUCROSE 20 MG/ML IV SOLN
200.0000 mg | Freq: Once | INTRAVENOUS | Status: AC
  Administered 2021-05-07: 200 mg via INTRAVENOUS
  Filled 2021-05-07: qty 10

## 2021-05-07 NOTE — Patient Instructions (Signed)

## 2021-05-08 ENCOUNTER — Encounter: Payer: Self-pay | Admitting: Internal Medicine

## 2021-05-08 ENCOUNTER — Other Ambulatory Visit: Payer: Self-pay | Admitting: *Deleted

## 2021-05-08 DIAGNOSIS — E611 Iron deficiency: Secondary | ICD-10-CM

## 2021-05-08 NOTE — Telephone Encounter (Signed)
yes

## 2021-05-08 NOTE — Telephone Encounter (Signed)
Ok to complete handicap form for her?

## 2021-05-09 ENCOUNTER — Inpatient Hospital Stay: Payer: Medicare Other | Attending: Internal Medicine

## 2021-05-09 ENCOUNTER — Ambulatory Visit: Payer: Medicare Other | Admitting: Internal Medicine

## 2021-05-09 VITALS — BP 125/75 | HR 91 | Temp 98.4°F | Resp 18

## 2021-05-09 DIAGNOSIS — E611 Iron deficiency: Secondary | ICD-10-CM

## 2021-05-09 DIAGNOSIS — D509 Iron deficiency anemia, unspecified: Secondary | ICD-10-CM | POA: Insufficient documentation

## 2021-05-09 DIAGNOSIS — Z79899 Other long term (current) drug therapy: Secondary | ICD-10-CM | POA: Diagnosis not present

## 2021-05-09 DIAGNOSIS — N183 Chronic kidney disease, stage 3 unspecified: Secondary | ICD-10-CM | POA: Insufficient documentation

## 2021-05-09 MED ORDER — IRON SUCROSE 20 MG/ML IV SOLN
200.0000 mg | Freq: Once | INTRAVENOUS | Status: AC
  Administered 2021-05-09: 200 mg via INTRAVENOUS
  Filled 2021-05-09: qty 10

## 2021-05-09 MED ORDER — SODIUM CHLORIDE 0.9 % IV SOLN: 200.0000 mg | Freq: Once | INTRAVENOUS | Status: DC

## 2021-05-09 MED ORDER — SODIUM CHLORIDE 0.9 % IV SOLN
Freq: Once | INTRAVENOUS | Status: AC
  Filled 2021-05-09: qty 250

## 2021-05-13 NOTE — Telephone Encounter (Signed)
Patient calling back in and informed

## 2021-05-15 ENCOUNTER — Other Ambulatory Visit: Payer: Self-pay

## 2021-05-15 ENCOUNTER — Ambulatory Visit (INDEPENDENT_AMBULATORY_CARE_PROVIDER_SITE_OTHER): Payer: Medicare Other

## 2021-05-15 DIAGNOSIS — L209 Atopic dermatitis, unspecified: Secondary | ICD-10-CM | POA: Diagnosis not present

## 2021-05-15 MED ORDER — DUPILUMAB 300 MG/2ML ~~LOC~~ SOSY
300.0000 mg | PREFILLED_SYRINGE | Freq: Once | SUBCUTANEOUS | Status: AC
  Administered 2021-05-15: 300 mg via SUBCUTANEOUS

## 2021-05-15 NOTE — Progress Notes (Signed)
Patient here for two week Dupixent injection. Dupixent '300mg'$ /107m injected into right upper arm. Patient tolerated well.  LOT: [id number]: 09/2022

## 2021-05-16 ENCOUNTER — Inpatient Hospital Stay (HOSPITAL_BASED_OUTPATIENT_CLINIC_OR_DEPARTMENT_OTHER): Payer: Medicare Other | Admitting: Internal Medicine

## 2021-05-16 ENCOUNTER — Inpatient Hospital Stay: Payer: Medicare Other

## 2021-05-16 VITALS — BP 153/81 | HR 79

## 2021-05-16 DIAGNOSIS — E611 Iron deficiency: Secondary | ICD-10-CM | POA: Diagnosis not present

## 2021-05-16 DIAGNOSIS — Z79899 Other long term (current) drug therapy: Secondary | ICD-10-CM | POA: Diagnosis not present

## 2021-05-16 DIAGNOSIS — N183 Chronic kidney disease, stage 3 unspecified: Secondary | ICD-10-CM | POA: Diagnosis not present

## 2021-05-16 DIAGNOSIS — D509 Iron deficiency anemia, unspecified: Secondary | ICD-10-CM | POA: Diagnosis not present

## 2021-05-16 LAB — CBC WITH DIFFERENTIAL/PLATELET
Abs Immature Granulocytes: 0.01 10*3/uL (ref 0.00–0.07)
Basophils Absolute: 0.1 10*3/uL (ref 0.0–0.1)
Basophils Relative: 1 %
Eosinophils Absolute: 0.1 10*3/uL (ref 0.0–0.5)
Eosinophils Relative: 2 %
HCT: 29.8 % — ABNORMAL LOW (ref 36.0–46.0)
Hemoglobin: 9 g/dL — ABNORMAL LOW (ref 12.0–15.0)
Immature Granulocytes: 0 %
Lymphocytes Relative: 39 %
Lymphs Abs: 1.9 10*3/uL (ref 0.7–4.0)
MCH: 29.2 pg (ref 26.0–34.0)
MCHC: 30.2 g/dL (ref 30.0–36.0)
MCV: 96.8 fL (ref 80.0–100.0)
Monocytes Absolute: 0.4 10*3/uL (ref 0.1–1.0)
Monocytes Relative: 9 %
Neutro Abs: 2.4 10*3/uL (ref 1.7–7.7)
Neutrophils Relative %: 49 %
Platelets: 354 10*3/uL (ref 150–400)
RBC: 3.08 MIL/uL — ABNORMAL LOW (ref 3.87–5.11)
RDW: 23.1 % — ABNORMAL HIGH (ref 11.5–15.5)
WBC: 4.9 10*3/uL (ref 4.0–10.5)
nRBC: 0 % (ref 0.0–0.2)

## 2021-05-16 LAB — LACTATE DEHYDROGENASE: LDH: 171 U/L (ref 98–192)

## 2021-05-16 MED ORDER — IRON SUCROSE 20 MG/ML IV SOLN
200.0000 mg | Freq: Once | INTRAVENOUS | Status: AC
Start: 2021-05-16 — End: 2021-05-16
  Administered 2021-05-16: 200 mg via INTRAVENOUS
  Filled 2021-05-16: qty 10

## 2021-05-16 MED ORDER — SODIUM CHLORIDE 0.9 % IV SOLN: 200.0000 mg | Freq: Once | INTRAVENOUS | Status: DC

## 2021-05-16 MED ORDER — SODIUM CHLORIDE 0.9 % IV SOLN
Freq: Once | INTRAVENOUS | Status: AC
  Filled 2021-05-16: qty 250

## 2021-05-16 NOTE — Progress Notes (Signed)
[place] NOTE  Patient Care Team: Einar Pheasant, MD as PCP - General (Internal Medicine) Efrain Sella, MD as Consulting Physician (Gastroenterology) Cammie Sickle, MD as Consulting Physician (Internal Medicine)  CHIEF COMPLAINTS/PURPOSE OF CONSULTATION: ANEMIA  HEMATOLOGY HISTORY:  # AUG 2022:  SEVERE ANEMIA [hb-7; ]  EGD/Colonoscopy:2020- last colo-74m [[facility] Dr.Toledo]; NO EGD.   # CKD stage III [Dr.Korrapti]; A.fib [on eliquis on 2.5 mg BID;  Dr.Kowalski]    HISTORY OF PRESENTING ILLNESS: Accompanied by daughter.  Ambulating with a cane. NErie Noe89y.o.  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  Patient s/p IV iron infusions x4-noted to have slight improvement of energy levels.  She is currently awaiting GI evaluation in the next couple of weeks.  Review of Systems  Constitutional:  Positive for malaise/fatigue. Negative for chills, diaphoresis, fever and weight loss.  HENT:  Negative for nosebleeds and sore throat.   Eyes:  Negative for double vision.  Respiratory:  Negative for cough, hemoptysis, sputum production and wheezing.   Cardiovascular:  Negative for chest pain, palpitations, orthopnea and leg swelling.  Gastrointestinal:  Positive for constipation. Negative for blood in stool, heartburn, melena and vomiting.  Genitourinary:  Negative for dysuria, frequency and urgency.  Musculoskeletal:  Positive for back pain and joint pain.  Skin: Negative.  Negative for itching and rash.  Neurological:  Negative for tingling, focal weakness, weakness and headaches.  Endo/Heme/Allergies:  Does not bruise/bleed easily.  Psychiatric/Behavioral:  Negative for depression. The patient is not nervous/anxious and does not have insomnia.    MEDICAL HISTORY:  Past Medical History:  Diagnosis Date   Abnormal liver function test 07/23/2019   Anemia    Aortic aneurysm (HCC)    "SMALL"   Arthritis    neck  to tailbone   Bilateral carpal tunnel syndrome 12/08/2017   Bronchitis    Chronic cough    @ NIGHT/ SEASONAL ALLERGIES   Clostridium difficile infection 2013-2014   Colon polyp    Concussion    Coronary artery disease    Dr KNehemiah Massedcardiologist   Depression    Diverticulosis    GERD (gastroesophageal reflux disease)    Glaucoma    Gout    Hemorrhoids    History of chicken pox    History of colonic polyps    History of shingles    Hypercholesterolemia    Hypertension    CONTROLLED ON MEDS   Hypovolemia 12/10   acute renal failure   IBS (irritable bowel syndrome)    Neuromuscular disorder (HCC)    occasionally tingling in hands/ NUMBNESS IN RIGHT FOOT s/p back surgery   Osteoarthritis    bilateral knees   Osteopenia    Pneumonia    HX OF   Sleep apnea    very mild   sleep study 8 yrs ago does not use CPAP   Urinary incontinence     SURGICAL HISTORY: Past Surgical History:  Procedure Laterality Date   ABDOMINAL HYSTERECTOMY  1979   ANTERIOR CERVICAL DECOMP/DISCECTOMY FUSION N/A 11/28/2014   Procedure: CERVICAL FIVE-SIX  ANTERIOR CERVICAL DECOMPRESSION/DISCECTOMY FUSION 1 LEVEL;  Surgeon: JNewman Pies MD;  [facility];  Service: Neurosurgery;  Laterality: N/A;  C56 anterior cervical decompression with fusion interbody prosthesis plating and bonegraft   BACK SURGERY     X2/ L4,5 & S1/ Cervical C7?   [place]  CARDIOVERSION N/A 10/29/2020   Procedure: CARDIOVERSION;  Surgeon: Corey Skains, MD;  Location: [facility];  Service: Cardiovascular;  Laterality: N/A;   CARPAL TUNNEL RELEASE  2007   CATARACT EXTRACTION W/PHACO Right 12/30/2016   Procedure: CATARACT EXTRACTION PHACO AND INTRAOCULAR LENS PLACEMENT ([place])  Right toric lens;  Surgeon: Leandrew Koyanagi, MD;  Location: [place];  Service: Ophthalmology;  Laterality: Right;  sleep apnea, does not use CPAP   CATARACT EXTRACTION W/PHACO  Left 01/20/2017   Procedure: CATARACT EXTRACTION PHACO AND INTRAOCULAR LENS PLACEMENT ([place])  Left Toric;  Surgeon: Leandrew Koyanagi, MD;  Location: [place];  Service: Ophthalmology;  Laterality: Left;  toric lens   CHOLECYSTECTOMY  1996   COLONOSCOPY     COLONOSCOPY WITH PROPOFOL N/A 05/22/2019   Procedure: COLONOSCOPY WITH PROPOFOL;  Surgeon: Toledo, Benay Pike, MD;  Location: [facility];  Service: Gastroenterology;  Laterality: N/A;   FOOT SURGERY  2005   JOINT REPLACEMENT Bilateral    KNEE   [place] Bilateral 2001   ROTATOR CUFF REPAIR  2000 & 2004   left and right   SPINAL CORD STIMULATOR IMPLANT  03/1997   SPINAL CORD STIMULATOR REMOVAL  2007   Tendon repair right shoulder     TONSILLECTOMY     TOTAL KNEE ARTHROPLASTY  10/2011 & 09/13   left and right    SOCIAL HISTORY: Social History   Socioeconomic History   Marital status: Widowed    Spouse name: Not on file   Number of children: 2   Years of education: Not on file   Highest education level: Not on file  Occupational History   Not on file  Tobacco Use   Smoking status: Former    Packs/day: 1.00    Years: 20.00    Pack years: 20.00    Types: Cigarettes    Quit date: 09/07/1988    Years since quitting: 32.7   Smokeless tobacco: Never  Vaping Use   Vaping Use: Never used  Substance and Sexual Activity   Alcohol use: Yes    Alcohol/week: 2.0 standard drinks    Types: 2 Glasses of wine per week    Comment: occasional   Drug use: No   Sexual activity: Never  Other Topics Concern   Not on file  Social History Narrative   She is widowed, has two daughters. [place]; quit smoking- in 1990s; social alcohol.    Social Determinants of Health   Financial Resource Strain: Not on file  Food Insecurity: Not on file  Transportation Needs: Not on file  Physical Activity: Not on file  Stress: Not on file  Social Connections: Not on file  Intimate Partner  Violence: Not on file    FAMILY HISTORY: Family History  Problem Relation Age of Onset   Heart disease Mother    Stroke Mother    Hypertension Mother    Hyperlipidemia Father    Heart disease Father    Heart disease Brother        2 brothers   Alzheimer's disease Brother    Heart disease Sister        pacemaker   Breast cancer Daughter 57    ALLERGIES:  is allergic to clindamycin/lincomycin, dronedarone, bactrim [sulfamethoxazole-trimethoprim], morphine, morphine and related, and sulfa antibiotics.  MEDICATIONS:  Current Outpatient Medications  Medication Sig Dispense Refill   cyanocobalamin 1000 MCG tablet Take 1,000 mcg by mouth.     DETROL LA 4 MG  24 hr capsule TAKE 1 CAPSULE DAILY 90 capsule 3   dupilumab (DUPIXENT) 300 MG/2ML prefilled syringe Inject 300 mg into the skin every 14 (fourteen) days. Starting at day 15 for maintenance. (Patient taking differently: Inject 600 mg into the skin every 14 (fourteen) days. Starting at day 15 for maintenance.) 4 mL 11   ELIQUIS 2.5 MG TABS tablet Take 2.5 mg by mouth 2 (two) times daily.     latanoprost (XALATAN) 0.005 % ophthalmic solution Place 1 drop into both eyes at bedtime.      Lifitegrast (XIIDRA) 5 % SOLN Place 1 drop into both eyes daily.     metoprolol succinate (TOPROL-XL) 25 MG 24 hr tablet Take 25 mg by mouth daily.     metoprolol succinate (TOPROL-XL) 50 MG 24 hr tablet Take 50 mg by mouth at bedtime. Take with or immediately following a meal.     pantoprazole (PROTONIX) 40 MG tablet TAKE 1 TABLET TWICE A DAY (Patient taking differently: Take 40 mg by mouth 2 (two) times daily.) 180 tablet 3   No current facility-administered medications for this visit.      PHYSICAL EXAMINATION:   Vitals:   05/16/21 1354  BP: (!) 138/99  Pulse: 90  Resp: 18  Temp: (!) 95.4 F (35.2 C)  SpO2: 100%   Filed Weights   05/16/21 1354  Weight: 151 lb 6.4 oz (68.7 kg)    Physical Exam Vitals and nursing note reviewed.   HENT:     Head: Normocephalic and atraumatic.     Mouth/Throat:     Pharynx: Oropharynx is clear.  Eyes:     Extraocular Movements: Extraocular movements intact.     Pupils: Pupils are equal, round, and reactive to light.  Cardiovascular:     Rate and Rhythm: Normal rate and regular rhythm.  Pulmonary:     Comments: Decreased breath sounds bilaterally.  Abdominal:     Palpations: Abdomen is soft.  Musculoskeletal:        General: Normal range of motion.     Cervical back: Normal range of motion.  Skin:    General: Skin is warm.  Neurological:     General: No focal deficit present.     Mental Status: She is alert and oriented to person, place, and time.  Psychiatric:        Behavior: Behavior normal.        Judgment: Judgment normal.   LABORATORY DATA:  I have reviewed the data as listed Lab Results  Component Value Date   WBC 4.9 05/16/2021   HGB 9.0 (L) 05/16/2021   HCT 29.8 (L) 05/16/2021   MCV 96.8 05/16/2021   PLT 354 05/16/2021   Recent Labs    10/03/20 0922 10/07/20 1018 10/21/20 1049 01/06/21 1409 02/17/21 1111 04/25/21 0759  NA 140   < > 137 138  --  139  K 4.5   < > 5.0 4.7  --  4.0  CL 105   < > 102 102  --  105  CO2 27   < > 24 25  --  23  GLUCOSE 76   < > 95 80  --  102*  BUN 30*   < > 35* 40*  --  30*  CREATININE 1.39*   < > 1.88* 1.54*  --  1.45*  CALCIUM 9.8   < > 10.0 9.5  --  9.6  PROT 6.6  --   --  6.6 6.8 6.7  7.0  ALBUMIN 4.4  --   --  4.5 4.5 4.4  AST 21  --   --  '21 22 16  '$ ALT 11  --   --  '12 12 10  '$ ALKPHOS 48  --   --  61 51 46  BILITOT 0.5  --   --  0.8 0.6 0.5  BILIDIR 0.1  --   --  0.1 0.1  --    < > = values in this interval not displayed.     No results found.  Iron deficiency #Anemia-iron deficiency; s/p IV venofer weekly x4.  Hemoglobin improved from 7-9.4.  Proceed with Venofer today.  #Etiology of iron deficiency:  awaiting GI evaluation- in appx 2 weeks.   # COnstipation: recommend miralax.Marland Kitchen   #A. fib  [Dr.Thomas; [facility] ]-on Eliquis 2.5 mg twice daily; no obvious evidence of bleeding.  Awaiting ablation next week.  # DISPOSITION: # venofer today;  # follow up in 1 month-  MD; labs- cbc/bmp Possible venofer-Dr.B     All questions were answered. The patient knows to call the clinic with any problems, questions or concerns.      Cammie Sickle, MD 05/16/2021 7:29 PM

## 2021-05-16 NOTE — Assessment & Plan Note (Addendum)
#  Anemia-iron deficiency; s/p IV venofer weekly x4.  Hemoglobin improved from 7-9.4.  Proceed with Venofer today.  #Etiology of iron deficiency:  awaiting GI evaluation- in appx 2 weeks.   # COnstipation: recommend miralax.Leah Huber   #A. fib [Dr.Thomas; [facility] ]-on Eliquis 2.5 mg twice daily; no obvious evidence of bleeding.  Awaiting ablation next week.  # DISPOSITION: # venofer today;  # follow up in 1 month-  MD; labs- cbc/bmp Possible venofer-Dr.B

## 2021-05-16 NOTE — Progress Notes (Signed)
Pt c/o constipation. Endorses OTC dulcolax use x2. Pt also states she has difficulty falling back asleep in the middle of night.

## 2021-05-20 DIAGNOSIS — Z20822 Contact with and (suspected) exposure to covid-19: Secondary | ICD-10-CM | POA: Diagnosis not present

## 2021-05-20 DIAGNOSIS — Z8679 Personal history of other diseases of the circulatory system: Secondary | ICD-10-CM | POA: Diagnosis not present

## 2021-05-20 DIAGNOSIS — Z01810 Encounter for preprocedural cardiovascular examination: Secondary | ICD-10-CM | POA: Diagnosis not present

## 2021-05-20 DIAGNOSIS — Z7901 Long term (current) use of anticoagulants: Secondary | ICD-10-CM | POA: Diagnosis not present

## 2021-05-20 DIAGNOSIS — I480 Paroxysmal atrial fibrillation: Secondary | ICD-10-CM | POA: Diagnosis not present

## 2021-05-20 DIAGNOSIS — R002 Palpitations: Secondary | ICD-10-CM | POA: Diagnosis not present

## 2021-05-20 DIAGNOSIS — E782 Mixed hyperlipidemia: Secondary | ICD-10-CM | POA: Diagnosis not present

## 2021-05-21 DIAGNOSIS — E785 Hyperlipidemia, unspecified: Secondary | ICD-10-CM | POA: Diagnosis not present

## 2021-05-21 DIAGNOSIS — I2510 Atherosclerotic heart disease of native coronary artery without angina pectoris: Secondary | ICD-10-CM | POA: Diagnosis not present

## 2021-05-21 DIAGNOSIS — Z79899 Other long term (current) drug therapy: Secondary | ICD-10-CM | POA: Diagnosis not present

## 2021-05-21 DIAGNOSIS — I480 Paroxysmal atrial fibrillation: Secondary | ICD-10-CM | POA: Diagnosis not present

## 2021-05-21 DIAGNOSIS — I10 Essential (primary) hypertension: Secondary | ICD-10-CM | POA: Diagnosis not present

## 2021-05-22 DIAGNOSIS — I10 Essential (primary) hypertension: Secondary | ICD-10-CM | POA: Diagnosis not present

## 2021-05-22 DIAGNOSIS — E785 Hyperlipidemia, unspecified: Secondary | ICD-10-CM | POA: Diagnosis not present

## 2021-05-22 DIAGNOSIS — I2510 Atherosclerotic heart disease of native coronary artery without angina pectoris: Secondary | ICD-10-CM | POA: Diagnosis not present

## 2021-05-22 DIAGNOSIS — Z79899 Other long term (current) drug therapy: Secondary | ICD-10-CM | POA: Diagnosis not present

## 2021-05-22 DIAGNOSIS — I480 Paroxysmal atrial fibrillation: Secondary | ICD-10-CM | POA: Diagnosis not present

## 2021-05-23 ENCOUNTER — Ambulatory Visit: Payer: Medicare Other | Admitting: Internal Medicine

## 2021-05-23 DIAGNOSIS — I2510 Atherosclerotic heart disease of native coronary artery without angina pectoris: Secondary | ICD-10-CM | POA: Diagnosis not present

## 2021-05-23 DIAGNOSIS — I10 Essential (primary) hypertension: Secondary | ICD-10-CM | POA: Diagnosis not present

## 2021-05-23 DIAGNOSIS — I480 Paroxysmal atrial fibrillation: Secondary | ICD-10-CM | POA: Diagnosis not present

## 2021-05-23 DIAGNOSIS — Z7901 Long term (current) use of anticoagulants: Secondary | ICD-10-CM | POA: Diagnosis not present

## 2021-05-23 DIAGNOSIS — Z79899 Other long term (current) drug therapy: Secondary | ICD-10-CM | POA: Diagnosis not present

## 2021-05-23 DIAGNOSIS — E785 Hyperlipidemia, unspecified: Secondary | ICD-10-CM | POA: Diagnosis not present

## 2021-05-23 DIAGNOSIS — Z9889 Other specified postprocedural states: Secondary | ICD-10-CM | POA: Diagnosis not present

## 2021-05-23 DIAGNOSIS — Z8679 Personal history of other diseases of the circulatory system: Secondary | ICD-10-CM | POA: Diagnosis not present

## 2021-05-26 ENCOUNTER — Telehealth: Payer: Self-pay | Admitting: Internal Medicine

## 2021-05-26 NOTE — Telephone Encounter (Signed)
Patient called and has not had a bowel movement since the 05/19/2021. Patient requested to speak with a nurse. Patient was transferred to [facility] at Access Nurse.

## 2021-05-26 NOTE — Telephone Encounter (Signed)
Please notify her that I was out of the office.  She can try an enema and if no results repeat x 1.  After bowel movement, continue miralax daily.  Let us know if persistent problems.

## 2021-05-26 NOTE — Telephone Encounter (Signed)
Access nurse instructed patient to go be evalauated today here in our office or UC. Patient stated she can not be evalauted today before she has to leave for her sisters funeral. Patient stated she may go to UC in [place] when she gets there. Access nurse al;so provided homecare advise due to situtation.

## 2021-05-27 NOTE — Telephone Encounter (Signed)
LMTCB

## 2021-05-28 NOTE — Telephone Encounter (Signed)
Patient calling back in and informed of the below. Patient verbalized understanding

## 2021-05-28 NOTE — Telephone Encounter (Signed)
Noted  

## 2021-06-03 ENCOUNTER — Other Ambulatory Visit: Payer: Self-pay | Admitting: Internal Medicine

## 2021-06-03 DIAGNOSIS — Z1231 Encounter for screening mammogram for malignant neoplasm of breast: Secondary | ICD-10-CM

## 2021-06-04 ENCOUNTER — Encounter: Payer: Self-pay | Admitting: Internal Medicine

## 2021-06-11 DIAGNOSIS — E7800 Pure hypercholesterolemia, unspecified: Secondary | ICD-10-CM | POA: Diagnosis not present

## 2021-06-11 DIAGNOSIS — E538 Deficiency of other specified B group vitamins: Secondary | ICD-10-CM | POA: Diagnosis not present

## 2021-06-11 DIAGNOSIS — I10 Essential (primary) hypertension: Secondary | ICD-10-CM | POA: Diagnosis not present

## 2021-06-11 DIAGNOSIS — N2581 Secondary hyperparathyroidism of renal origin: Secondary | ICD-10-CM | POA: Diagnosis not present

## 2021-06-11 DIAGNOSIS — R809 Proteinuria, unspecified: Secondary | ICD-10-CM | POA: Diagnosis not present

## 2021-06-11 DIAGNOSIS — N281 Cyst of kidney, acquired: Secondary | ICD-10-CM | POA: Diagnosis not present

## 2021-06-11 DIAGNOSIS — D631 Anemia in chronic kidney disease: Secondary | ICD-10-CM | POA: Diagnosis not present

## 2021-06-11 DIAGNOSIS — N1832 Chronic kidney disease, stage 3b: Secondary | ICD-10-CM | POA: Diagnosis not present

## 2021-06-13 ENCOUNTER — Other Ambulatory Visit (HOSPITAL_BASED_OUTPATIENT_CLINIC_OR_DEPARTMENT_OTHER): Payer: Self-pay | Admitting: Gastroenterology

## 2021-06-13 ENCOUNTER — Other Ambulatory Visit: Payer: Self-pay | Admitting: *Deleted

## 2021-06-13 ENCOUNTER — Other Ambulatory Visit: Payer: Self-pay | Admitting: Gastroenterology

## 2021-06-13 DIAGNOSIS — Z86010 Personal history of colonic polyps: Secondary | ICD-10-CM | POA: Diagnosis not present

## 2021-06-13 DIAGNOSIS — E611 Iron deficiency: Secondary | ICD-10-CM

## 2021-06-13 DIAGNOSIS — Z8679 Personal history of other diseases of the circulatory system: Secondary | ICD-10-CM | POA: Diagnosis not present

## 2021-06-13 DIAGNOSIS — R1310 Dysphagia, unspecified: Secondary | ICD-10-CM | POA: Diagnosis not present

## 2021-06-13 DIAGNOSIS — D509 Iron deficiency anemia, unspecified: Secondary | ICD-10-CM | POA: Diagnosis not present

## 2021-06-13 DIAGNOSIS — Z9889 Other specified postprocedural states: Secondary | ICD-10-CM | POA: Diagnosis not present

## 2021-06-13 DIAGNOSIS — R0989 Other specified symptoms and signs involving the circulatory and respiratory systems: Secondary | ICD-10-CM | POA: Diagnosis not present

## 2021-06-13 DIAGNOSIS — Z7901 Long term (current) use of anticoagulants: Secondary | ICD-10-CM | POA: Diagnosis not present

## 2021-06-17 DIAGNOSIS — H401131 Primary open-angle glaucoma, bilateral, mild stage: Secondary | ICD-10-CM | POA: Diagnosis not present

## 2021-06-18 ENCOUNTER — Other Ambulatory Visit: Payer: Self-pay

## 2021-06-18 ENCOUNTER — Inpatient Hospital Stay: Payer: Medicare Other | Attending: Internal Medicine

## 2021-06-18 ENCOUNTER — Encounter: Payer: Self-pay | Admitting: Internal Medicine

## 2021-06-18 ENCOUNTER — Inpatient Hospital Stay (HOSPITAL_BASED_OUTPATIENT_CLINIC_OR_DEPARTMENT_OTHER): Payer: Medicare Other | Admitting: Internal Medicine

## 2021-06-18 ENCOUNTER — Inpatient Hospital Stay: Payer: Medicare Other

## 2021-06-18 VITALS — BP 145/78 | HR 64 | Temp 98.5°F | Resp 16 | Wt 149.4 lb

## 2021-06-18 VITALS — BP 170/75 | HR 57 | Temp 97.0°F | Resp 17

## 2021-06-18 DIAGNOSIS — E611 Iron deficiency: Secondary | ICD-10-CM | POA: Diagnosis not present

## 2021-06-18 DIAGNOSIS — D509 Iron deficiency anemia, unspecified: Secondary | ICD-10-CM | POA: Diagnosis not present

## 2021-06-18 LAB — CBC WITH DIFFERENTIAL/PLATELET
Abs Immature Granulocytes: 0.01 10*3/uL (ref 0.00–0.07)
Basophils Absolute: 0 10*3/uL (ref 0.0–0.1)
Basophils Relative: 1 %
Eosinophils Absolute: 0.1 10*3/uL (ref 0.0–0.5)
Eosinophils Relative: 3 %
HCT: 37.4 % (ref 36.0–46.0)
Hemoglobin: 11.6 g/dL — ABNORMAL LOW (ref 12.0–15.0)
Immature Granulocytes: 0 %
Lymphocytes Relative: 43 %
Lymphs Abs: 2.2 10*3/uL (ref 0.7–4.0)
MCH: 30.4 pg (ref 26.0–34.0)
MCHC: 31 g/dL (ref 30.0–36.0)
MCV: 98.2 fL (ref 80.0–100.0)
Monocytes Absolute: 0.4 10*3/uL (ref 0.1–1.0)
Monocytes Relative: 8 %
Neutro Abs: 2.4 10*3/uL (ref 1.7–7.7)
Neutrophils Relative %: 45 %
Platelets: 305 10*3/uL (ref 150–400)
RBC: 3.81 MIL/uL — ABNORMAL LOW (ref 3.87–5.11)
RDW: 21.3 % — ABNORMAL HIGH (ref 11.5–15.5)
WBC: 5.2 10*3/uL (ref 4.0–10.5)
nRBC: 0 % (ref 0.0–0.2)

## 2021-06-18 LAB — BASIC METABOLIC PANEL
Anion gap: 11 (ref 5–15)
BUN: 23 mg/dL (ref 8–23)
CO2: 25 mmol/L (ref 22–32)
Calcium: 9.2 mg/dL (ref 8.9–10.3)
Chloride: 101 mmol/L (ref 98–111)
Creatinine, Ser: 1.22 mg/dL — ABNORMAL HIGH (ref 0.44–1.00)
GFR, Estimated: 45 mL/min — ABNORMAL LOW (ref >60)
Glucose, Bld: 95 mg/dL (ref 70–99)
Potassium: 4.1 mmol/L (ref 3.5–5.1)
Sodium: 137 mmol/L (ref 135–145)

## 2021-06-18 MED ORDER — SODIUM CHLORIDE 0.9 % IV SOLN
Freq: Once | INTRAVENOUS | Status: AC
  Filled 2021-06-18: qty 250

## 2021-06-18 MED ORDER — SODIUM CHLORIDE 0.9 % IV SOLN: 200.0000 mg | Freq: Once | INTRAVENOUS | Status: DC

## 2021-06-18 MED ORDER — IRON SUCROSE 20 MG/ML IV SOLN
200.0000 mg | Freq: Once | INTRAVENOUS | Status: AC
  Administered 2021-06-18: 200 mg via INTRAVENOUS
  Filled 2021-06-18: qty 10

## 2021-06-18 NOTE — Progress Notes (Signed)
Patient here for follow up she feels tired occasionally.

## 2021-06-18 NOTE — Assessment & Plan Note (Signed)
#  Anemia-iron deficiency; s/p IV venofer weekly x4.  Hemoglobin improved from 7 to 11.  Proceed with Venofer today.  #Etiology of iron deficiency: [Oct 2022] s/p [facility] evaluation; awaiting barium esophagogram.  # COnstipation: recommend miralax.Leah Huber   #A. fib [Dr.Thomas; [facility] ]-on Eliquis 2.5 mg twice daily- s/p ablation [[facility]-Oct 2022]  # DISPOSITION: # venofer today;  # follow up in 3 month-  MD; labs- cbc/bmp/iron studies/ferritin- Possible venofer-Dr.B

## 2021-06-18 NOTE — Progress Notes (Signed)
1520: Pt tolerated infusion well. B/P elevated. Pt denies any symptoms. No s/s of distress noted. Per Dr. Rogue Bussing okay to discharge home, pt to monitor b/p at home and call PCP if b/p remains elevated. Per verbalizes understanding. Pt stable at discharge.

## 2021-06-18 NOTE — Progress Notes (Signed)
[place] NOTE  Patient Care Team: Einar Pheasant, MD as PCP - General (Internal Medicine) Efrain Sella, MD as Consulting Physician (Gastroenterology) Cammie Sickle, MD as Consulting Physician (Internal Medicine)  CHIEF COMPLAINTS/PURPOSE OF CONSULTATION: ANEMIA  HEMATOLOGY HISTORY:  # AUG 2022:  SEVERE ANEMIA [hb-7; ]  EGD/Colonoscopy:2020- last colo-92mm [facility]; Dr.Toledo]; NO EGD.   # CKD stage III [Dr.Korrapti]; A.fib [on eliquis on 2.5 mg BID;  Dr.Kowalski]    HISTORY OF PRESENTING ILLNESS: Alone. Ambulating with a cane.  Leah Huber 80 y.o.  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   In the interim patient underwent cardiac ablation at [facility].  Tolerated procedure well.  Patient s/p IV iron infusions x5 -noted to have  improvement of energy levels.  She is currently s/p GI evaluation .   Review of Systems  Constitutional:  Positive for malaise/fatigue. Negative for chills, diaphoresis, fever and weight loss.  HENT:  Negative for nosebleeds and sore throat.   Eyes:  Negative for double vision.  Respiratory:  Negative for cough, hemoptysis, sputum production and wheezing.   Cardiovascular:  Negative for chest pain, palpitations, orthopnea and leg swelling.  Gastrointestinal:  Positive for constipation. Negative for blood in stool, heartburn, melena and vomiting.  Genitourinary:  Negative for dysuria, frequency and urgency.  Musculoskeletal:  Positive for back pain and joint pain.  Skin: Negative.  Negative for itching and rash.  Neurological:  Negative for tingling, focal weakness, weakness and headaches.  Endo/Heme/Allergies:  Does not bruise/bleed easily.  Psychiatric/Behavioral:  Negative for depression. The patient is not nervous/anxious and does not have insomnia.    MEDICAL HISTORY:  Past Medical History:  Diagnosis Date   Abnormal liver function test 07/23/2019   Anemia    Aortic aneurysm  (HCC)    "SMALL"   Arthritis    neck to tailbone   Bilateral carpal tunnel syndrome 12/08/2017   Bronchitis    Chronic cough    @ NIGHT/ SEASONAL ALLERGIES   Clostridium difficile infection 2013-2014   Colon polyp    Concussion    Coronary artery disease    Dr Nehemiah Massed cardiologist   Depression    Diverticulosis    GERD (gastroesophageal reflux disease)    Glaucoma    Gout    Hemorrhoids    History of chicken pox    History of colonic polyps    History of shingles    Hypercholesterolemia    Hypertension    CONTROLLED ON MEDS   Hypovolemia 12/10   acute renal failure   IBS (irritable bowel syndrome)    Neuromuscular disorder (HCC)    occasionally tingling in hands/ NUMBNESS IN RIGHT FOOT s/p back surgery   Osteoarthritis    bilateral knees   Osteopenia    Pneumonia    HX OF   Sleep apnea    very mild   sleep study 8 yrs ago does not use CPAP   Urinary incontinence     SURGICAL HISTORY: Past Surgical History:  Procedure Laterality Date   ABDOMINAL HYSTERECTOMY  1979   ANTERIOR CERVICAL DECOMP/DISCECTOMY FUSION N/A 11/28/2014   Procedure: CERVICAL FIVE-SIX  ANTERIOR CERVICAL DECOMPRESSION/DISCECTOMY FUSION 1 LEVEL;  Surgeon: Newman Pies, MD;  Location: [facility];  Service: Neurosurgery;  Laterality: N/A;  C56 anterior cervical decompression with fusion interbody prosthesis plating and bonegraft   BACK SURGERY     X2/ L4,5 & S1/ Cervical C7?   [place]  SUSPENSION   BREAST REDUCTION SURGERY     CARDIOVERSION N/A 10/29/2020   Procedure: CARDIOVERSION;  Surgeon: Corey Skains, MD;  Location: [facility];  Service: Cardiovascular;  Laterality: N/A;   CARPAL TUNNEL RELEASE  2007   CATARACT EXTRACTION W/PHACO Right 12/30/2016   Procedure: CATARACT EXTRACTION PHACO AND INTRAOCULAR LENS PLACEMENT (IOC)  Right toric lens;  Surgeon: Leandrew Koyanagi, MD;  Location: [place];  Service: Ophthalmology;  Laterality: Right;  sleep apnea, does not  use CPAP   CATARACT EXTRACTION W/PHACO Left 01/20/2017   Procedure: CATARACT EXTRACTION PHACO AND INTRAOCULAR LENS PLACEMENT ([place])  Left Toric;  Surgeon: Leandrew Koyanagi, MD;  Location: [place];  Service: Ophthalmology;  Laterality: Left;  toric lens   CHOLECYSTECTOMY  1996   COLONOSCOPY     COLONOSCOPY WITH PROPOFOL N/A 05/22/2019   Procedure: COLONOSCOPY WITH PROPOFOL;  Surgeon: Toledo, Benay Pike, MD;  Location: [facility];  Service: Gastroenterology;  Laterality: N/A;   FOOT SURGERY  2005   JOINT REPLACEMENT Bilateral    KNEE   [place] Bilateral 2001   ROTATOR CUFF REPAIR  2000 & 2004   left and right   SPINAL CORD STIMULATOR IMPLANT  03/1997   SPINAL CORD STIMULATOR REMOVAL  2007   Tendon repair right shoulder     TONSILLECTOMY     TOTAL KNEE ARTHROPLASTY  10/2011 & 09/13   left and right    SOCIAL HISTORY: Social History   Socioeconomic History   Marital status: Widowed    Spouse name: Not on file   Number of children: 2   Years of education: Not on file   Highest education level: Not on file  Occupational History   Not on file  Tobacco Use   Smoking status: Former    Packs/day: 1.00    Years: 20.00    Pack years: 20.00    Types: Cigarettes    Quit date: 09/07/1988    Years since quitting: 32.8   Smokeless tobacco: Never  Vaping Use   Vaping Use: Never used  Substance and Sexual Activity   Alcohol use: Yes    Alcohol/week: 2.0 standard drinks    Types: 2 Glasses of wine per week    Comment: occasional   Drug use: No   Sexual activity: Never  Other Topics Concern   Not on file  Social History Narrative   She is widowed, has two daughters. [place]; quit smoking- in 1990s; social alcohol.    Social Determinants of Health   Financial Resource Strain: Not on file  Food Insecurity: Not on file  Transportation Needs: Not on file  Physical Activity: Not on file  Stress: Not on file  Social  Connections: Not on file  Intimate Partner Violence: Not on file    FAMILY HISTORY: Family History  Problem Relation Age of Onset   Heart disease Mother    Stroke Mother    Hypertension Mother    Hyperlipidemia Father    Heart disease Father    Heart disease Brother        2 brothers   Alzheimer's disease Brother    Heart disease Sister        pacemaker   Breast cancer Daughter 33    ALLERGIES:  is allergic to clindamycin/lincomycin, dronedarone, bactrim [sulfamethoxazole-trimethoprim], morphine, morphine and related, and sulfa antibiotics.  MEDICATIONS:  Current Outpatient Medications  Medication Sig Dispense Refill   cyanocobalamin 1000 MCG tablet Take 1,000 mcg  by mouth.     DETROL LA 4 MG 24 hr capsule TAKE 1 CAPSULE DAILY 90 capsule 3   dupilumab (DUPIXENT) 300 MG/2ML prefilled syringe Inject 300 mg into the skin every 14 (fourteen) days. Starting at day 15 for maintenance. (Patient taking differently: Inject 600 mg into the skin every 14 (fourteen) days. Starting at day 15 for maintenance.) 4 mL 11   ELIQUIS 2.5 MG TABS tablet Take 2.5 mg by mouth 2 (two) times daily.     latanoprost (XALATAN) 0.005 % ophthalmic solution Place 1 drop into both eyes at bedtime.      Lifitegrast (XIIDRA) 5 % SOLN Place 1 drop into both eyes daily.     metoprolol succinate (TOPROL-XL) 25 MG 24 hr tablet Take 25 mg by mouth daily.     metoprolol succinate (TOPROL-XL) 50 MG 24 hr tablet Take 50 mg by mouth at bedtime. Take with or immediately following a meal.     pantoprazole (PROTONIX) 40 MG tablet TAKE 1 TABLET TWICE A DAY (Patient taking differently: Take 40 mg by mouth 2 (two) times daily.) 180 tablet 3   No current facility-administered medications for this visit.   Facility-Administered Medications Ordered in Other Visits  Medication Dose Route Frequency Provider Last Rate Last Admin   iron sucrose (VENOFER) injection 200 mg  200 mg Intravenous Once Charlaine Dalton R, MD           PHYSICAL EXAMINATION:   Vitals:   06/18/21 1350  BP: (!) 145/78  Pulse: 64  Resp: 16  Temp: 98.5 F (36.9 C)   Filed Weights   06/18/21 1350  Weight: 149 lb 6.4 oz (67.8 kg)    Physical Exam Vitals and nursing note reviewed.  HENT:     Head: Normocephalic and atraumatic.     Mouth/Throat:     Pharynx: Oropharynx is clear.  Eyes:     Extraocular Movements: Extraocular movements intact.     Pupils: Pupils are equal, round, and reactive to light.  Cardiovascular:     Rate and Rhythm: Normal rate and regular rhythm.  Pulmonary:     Comments: Decreased breath sounds bilaterally.  Abdominal:     Palpations: Abdomen is soft.  Musculoskeletal:        General: Normal range of motion.     Cervical back: Normal range of motion.  Skin:    General: Skin is warm.  Neurological:     General: No focal deficit present.     Mental Status: She is alert and oriented to person, place, and time.  Psychiatric:        Behavior: Behavior normal.        Judgment: Judgment normal.   LABORATORY DATA:  I have reviewed the data as listed Lab Results  Component Value Date   WBC 5.2 06/18/2021   HGB 11.6 (L) 06/18/2021   HCT 37.4 06/18/2021   MCV 98.2 06/18/2021   PLT 305 06/18/2021   Recent Labs    10/03/20 0922 10/07/20 1018 01/06/21 1409 02/17/21 1111 04/25/21 0759 06/18/21 1319  NA 140   < > 138  --  139 137  K 4.5   < > 4.7  --  4.0 4.1  CL 105   < > 102  --  105 101  CO2 27   < > 25  --  23 25  GLUCOSE 76   < > 80  --  102* 95  BUN 30*   < > 40*  --  30*  23  CREATININE 1.39*   < > 1.54*  --  1.45* 1.22*  CALCIUM 9.8   < > 9.5  --  9.6 9.2  GFRNONAA  --   --   --   --   --  45*  PROT 6.6  --  6.6 6.8 6.7  7.0  --   ALBUMIN 4.4  --  4.5 4.5 4.4  --   AST 21  --  21 22 16   --   ALT 11  --  12 12 10   --   ALKPHOS 48  --  61 51 46  --   BILITOT 0.5  --  0.8 0.6 0.5  --   BILIDIR 0.1  --  0.1 0.1  --   --    < > = values in this interval not displayed.      No results found.  No problem-specific Assessment & Plan notes found for this encounter.     All questions were answered. The patient knows to call the clinic with any problems, questions or concerns.      Cammie Sickle, MD 06/18/2021 2:52 PM

## 2021-06-18 NOTE — Patient Instructions (Signed)

## 2021-06-19 ENCOUNTER — Encounter: Payer: Self-pay | Admitting: Internal Medicine

## 2021-06-19 LAB — GLIADIN ANTIBODIES, SERUM
Antigliadin Abs, IgA: 2 units (ref 0–19)
Gliadin IgG: 2 units (ref 0–19)

## 2021-06-19 NOTE — Telephone Encounter (Signed)
I am ok with her taking melatonin and have her start with 3mg  q hs.

## 2021-06-20 LAB — RETICULIN ANTIBODIES, IGA W TITER: Reticulin Ab, IgA: NEGATIVE titer (ref <2.5)

## 2021-06-21 LAB — TISSUE TRANSGLUTAMINASE, IGA: Tissue Transglutaminase Ab, IgA: <2 U/mL (ref 0–3)

## 2021-06-23 ENCOUNTER — Other Ambulatory Visit: Payer: Self-pay

## 2021-06-23 ENCOUNTER — Ambulatory Visit
Admission: RE | Admit: 2021-06-23 | Discharge: 2021-06-23 | Disposition: A | Discharge status: Home / Self Care | Payer: Medicare Other | Source: Ambulatory Visit | Attending: Gastroenterology | Admitting: Gastroenterology

## 2021-06-23 ENCOUNTER — Other Ambulatory Visit: Payer: Self-pay | Admitting: Gastroenterology

## 2021-06-23 DIAGNOSIS — R0989 Other specified symptoms and signs involving the circulatory and respiratory systems: Secondary | ICD-10-CM

## 2021-06-23 DIAGNOSIS — R1310 Dysphagia, unspecified: Secondary | ICD-10-CM

## 2021-06-23 DIAGNOSIS — D509 Iron deficiency anemia, unspecified: Secondary | ICD-10-CM

## 2021-06-23 DIAGNOSIS — K224 Dyskinesia of esophagus: Secondary | ICD-10-CM | POA: Diagnosis not present

## 2021-07-07 ENCOUNTER — Ambulatory Visit: Payer: Medicare Other

## 2021-07-16 ENCOUNTER — Ambulatory Visit (INDEPENDENT_AMBULATORY_CARE_PROVIDER_SITE_OTHER): Payer: Medicare Other | Admitting: Dermatology

## 2021-07-16 ENCOUNTER — Other Ambulatory Visit: Payer: Self-pay

## 2021-07-16 DIAGNOSIS — L309 Dermatitis, unspecified: Secondary | ICD-10-CM | POA: Diagnosis not present

## 2021-07-16 NOTE — Progress Notes (Signed)
   Follow-Up Visit   Subjective  Leah Huber is a 80 y.o. female who presents for the following: Follow-up (Patient here today for atopic dermatitis follow up. Patient's last Dupixent injection was 05/15/21. Patient has been dealing with other health issues. She is not having any itching and is not using any topicals. ).   The following portions of the chart were reviewed this encounter and updated as appropriate:   Tobacco  Allergies  Meds  Problems  Med Hx  Surg Hx  Fam Hx      Review of Systems:  No other skin or systemic complaints except as noted in HPI or Assessment and Plan.  Objective  Well appearing patient in no apparent distress; mood and affect are within normal limits.  A focused examination was performed including arms. Relevant physical exam findings are noted in the Assessment and Plan.  arms Clear    Assessment & Plan  Dermatitis arms  Patient has been off of dupixent and nbUVB, staying clear  Agree with staying off of treatment and monitor for recurrence. Call if recurs and restart dupixent.   Return if symptoms worsen or fail to improve.  Graciella Belton, RMA, am acting as scribe for Forest Gleason, MD .   Documentation: I have reviewed the above documentation for accuracy and completeness, and I agree with the above.  Forest Gleason, MD

## 2021-07-16 NOTE — Patient Instructions (Signed)

## 2021-07-18 DIAGNOSIS — I480 Paroxysmal atrial fibrillation: Secondary | ICD-10-CM | POA: Diagnosis not present

## 2021-07-22 ENCOUNTER — Encounter: Payer: Self-pay | Admitting: Dermatology

## 2021-08-11 ENCOUNTER — Other Ambulatory Visit: Payer: Self-pay

## 2021-08-11 ENCOUNTER — Telehealth: Payer: Self-pay | Admitting: Internal Medicine

## 2021-08-11 ENCOUNTER — Ambulatory Visit (INDEPENDENT_AMBULATORY_CARE_PROVIDER_SITE_OTHER): Payer: Medicare Other | Admitting: Internal Medicine

## 2021-08-11 ENCOUNTER — Encounter: Payer: Self-pay | Admitting: Internal Medicine

## 2021-08-11 DIAGNOSIS — I4891 Unspecified atrial fibrillation: Secondary | ICD-10-CM | POA: Diagnosis not present

## 2021-08-11 DIAGNOSIS — Z86010 Personal history of colonic polyps: Secondary | ICD-10-CM | POA: Diagnosis not present

## 2021-08-11 DIAGNOSIS — D692 Other nonthrombocytopenic purpura: Secondary | ICD-10-CM | POA: Diagnosis not present

## 2021-08-11 DIAGNOSIS — R1010 Upper abdominal pain, unspecified: Secondary | ICD-10-CM

## 2021-08-11 DIAGNOSIS — N1832 Chronic kidney disease, stage 3b: Secondary | ICD-10-CM | POA: Diagnosis not present

## 2021-08-11 DIAGNOSIS — D649 Anemia, unspecified: Secondary | ICD-10-CM | POA: Diagnosis not present

## 2021-08-11 DIAGNOSIS — R051 Acute cough: Secondary | ICD-10-CM

## 2021-08-11 DIAGNOSIS — I712 Thoracic aortic aneurysm, without rupture, unspecified: Secondary | ICD-10-CM | POA: Diagnosis not present

## 2021-08-11 DIAGNOSIS — Z23 Encounter for immunization: Secondary | ICD-10-CM

## 2021-08-11 DIAGNOSIS — G479 Sleep disorder, unspecified: Secondary | ICD-10-CM

## 2021-08-11 DIAGNOSIS — I10 Essential (primary) hypertension: Secondary | ICD-10-CM | POA: Diagnosis not present

## 2021-08-11 DIAGNOSIS — E7800 Pure hypercholesterolemia, unspecified: Secondary | ICD-10-CM | POA: Diagnosis not present

## 2021-08-11 DIAGNOSIS — F439 Reaction to severe stress, unspecified: Secondary | ICD-10-CM | POA: Diagnosis not present

## 2021-08-11 DIAGNOSIS — G4730 Sleep apnea, unspecified: Secondary | ICD-10-CM

## 2021-08-11 DIAGNOSIS — K219 Gastro-esophageal reflux disease without esophagitis: Secondary | ICD-10-CM | POA: Diagnosis not present

## 2021-08-11 DIAGNOSIS — R0989 Other specified symptoms and signs involving the circulatory and respiratory systems: Secondary | ICD-10-CM

## 2021-08-11 LAB — LIPID PANEL
Cholesterol: 245 mg/dL — ABNORMAL HIGH (ref 0–200)
HDL: 90.1 mg/dL (ref >39.00)
LDL Cholesterol: 126 mg/dL — ABNORMAL HIGH (ref 0–99)
NonHDL: 155.26
Total CHOL/HDL Ratio: 3
Triglycerides: 147 mg/dL (ref 0.0–149.0)
VLDL: 29.4 mg/dL (ref 0.0–40.0)

## 2021-08-11 LAB — BASIC METABOLIC PANEL
BUN: 16 mg/dL (ref 6–23)
CO2: 28 mEq/L (ref 19–32)
Calcium: 9.7 mg/dL (ref 8.4–10.5)
Chloride: 102 mEq/L (ref 96–112)
Creatinine, Ser: 1.19 mg/dL (ref 0.40–1.20)
GFR: 42.83 mL/min — ABNORMAL LOW (ref >60.00)
Glucose, Bld: 81 mg/dL (ref 70–99)
Potassium: 4.5 mEq/L (ref 3.5–5.1)
Sodium: 139 mEq/L (ref 135–145)

## 2021-08-11 LAB — HEPATIC FUNCTION PANEL
ALT: 14 U/L (ref 0–35)
AST: 18 U/L (ref 0–37)
Albumin: 4.4 g/dL (ref 3.5–5.2)
Alkaline Phosphatase: 50 U/L (ref 39–117)
Bilirubin, Direct: 0.1 mg/dL (ref 0.0–0.3)
Total Bilirubin: 0.5 mg/dL (ref 0.2–1.2)
Total Protein: 6.6 g/dL (ref 6.0–8.3)

## 2021-08-11 MED ORDER — PANTOPRAZOLE SODIUM 40 MG PO TBEC: DELAYED_RELEASE_TABLET | ORAL | 0 refills | Status: DC

## 2021-08-11 NOTE — Telephone Encounter (Signed)
Patient informed will be in tomorrow.

## 2021-08-11 NOTE — Telephone Encounter (Signed)
Yes. Given exposure to covid.  Also, she needs to let us know if any problems or new symptoms.

## 2021-08-11 NOTE — Telephone Encounter (Signed)
Patient saw Dr Nicki Reaper this morning. She just found out that her niece tested positive for covid. Patient was around niece the Sunday after Thanksgiving. Does she need to be tested, patient only has a little cough that she had before Thanksgiving. Patient has only had the first 2 covid vaccines.

## 2021-08-11 NOTE — Telephone Encounter (Signed)
Called patient put on schedule for testing tomorrow. She will call If any questions. Put order in for covid only. Wanted to make sure that was what you requested

## 2021-08-11 NOTE — Progress Notes (Signed)
Patient ID: Leah Huber, female   DOB: [birth year], 80 y.o.   MRN: [id number]   Subjective:    Patient ID: Leah Huber, female    DOB: 1941-02-19, 80 y.o.   MRN: [id number]  This visit occurred during the SARS-CoV-2 public health emergency.  Safety protocols were in place, including screening questions prior to the visit, additional usage of staff PPE, and extensive cleaning of exam room while observing appropriate contact time as indicated for disinfecting solutions.   Patient here for a scheduled follow up.   Chief Complaint  Patient presents with   Insomnia   Sore Throat    Feels like there is knot in throat. Has been seen by GI and has appointment with ENT    .   HPI Has been having persistent problems with a sensation of a lump in her throat (globus sensation).  Saw GI. S/p barium swallow - revealed moderate esophageal dysmotility and no evidence of a narrowing/stricture in her esophagus.  Tablet passed normally into the stomach without delay. Did not feel EGD warranted.  Did recommend ENT referral.  She does request referral to ENT given persistence.  She is eating.  No chest pain.  S/p ablation.  Feels from a cardiac standpoint - stable.  No increased chest congestion.  No sob reported.  Seeing Dr Rogue Bussing for f/u anemia.  Receiving IV venofer.  Also seeing Dr Lanora Manis.  Off losartan.  Blood pressure is a little elevated. Reports some intermittent RLQ pain.  Reports is better after a bowel movement.  No pain currently.  Also reports problems sleeping.  Has known sleep apnea.  Is claustrophobic.  Unable to tolerate mask. Discussed reevaluation of sleep apnea and discussed other treatment options.  Discussed melatonin to help with sleep.     Past Medical History:  Diagnosis Date   Abnormal liver function test 07/23/2019   Anemia    Aortic aneurysm (HCC)    "SMALL"   Arthritis    neck to tailbone   Bilateral carpal tunnel syndrome 12/08/2017   Bronchitis    Chronic cough    @  NIGHT/ SEASONAL ALLERGIES   Clostridium difficile infection 2013-2014   Colon polyp    Concussion    Coronary artery disease    Dr Nehemiah Massed cardiologist   Depression    Diverticulosis    GERD (gastroesophageal reflux disease)    Glaucoma    Gout    Hemorrhoids    History of chicken pox    History of colonic polyps    History of shingles    Hypercholesterolemia    Hypertension    CONTROLLED ON MEDS   Hypovolemia 12/10   acute renal failure   IBS (irritable bowel syndrome)    Neuromuscular disorder (HCC)    occasionally tingling in hands/ NUMBNESS IN RIGHT FOOT s/p back surgery   Osteoarthritis    bilateral knees   Osteopenia    Pneumonia    HX OF   Sleep apnea    very mild   sleep study 8 yrs ago does not use CPAP   Urinary incontinence    Past Surgical History:  Procedure Laterality Date   ABDOMINAL HYSTERECTOMY  1979   ANTERIOR CERVICAL DECOMP/DISCECTOMY FUSION N/A 11/28/2014   Procedure: CERVICAL FIVE-SIX  ANTERIOR CERVICAL DECOMPRESSION/DISCECTOMY FUSION 1 LEVEL;  Surgeon: Newman Pies, MD;  Location: [facility];  Service: Neurosurgery;  Laterality: N/A;  C56 anterior cervical decompression with fusion interbody prosthesis plating and bonegraft   BACK SURGERY  X2/ L4,5 & S1/ Cervical C7?   BLADDER SURGERY  1981   SUSPENSION   BREAST REDUCTION SURGERY     CARDIOVERSION N/A 10/29/2020   Procedure: CARDIOVERSION;  Surgeon: Corey Skains, MD;  Location: [facility];  Service: Cardiovascular;  Laterality: N/A;   CARPAL TUNNEL RELEASE  2007   CATARACT EXTRACTION W/PHACO Right 12/30/2016   Procedure: CATARACT EXTRACTION PHACO AND INTRAOCULAR LENS PLACEMENT ([facility])  Right toric lens;  Surgeon: Leandrew Koyanagi, MD;  Location: [facility];  Service: Ophthalmology;  Laterality: Right;  sleep apnea, does not use CPAP   CATARACT EXTRACTION W/PHACO Left 01/20/2017   Procedure: CATARACT EXTRACTION PHACO AND INTRAOCULAR LENS PLACEMENT ([place])  Left Toric;  Surgeon:  Leandrew Koyanagi, MD;  Location: [place];  Service: Ophthalmology;  Laterality: Left;  toric lens   CHOLECYSTECTOMY  1996   COLONOSCOPY     COLONOSCOPY WITH PROPOFOL N/A 05/22/2019   Procedure: COLONOSCOPY WITH PROPOFOL;  Surgeon: Tol[place], Benay Pike, MD;  Location: [facility];  Service: Gastroenterology;  Laterality: N/A;   FOOT SURGERY  2005   JOINT REPLACEMENT Bilateral    KNEE   Coram Bilateral 2001   ROTATOR CUFF REPAIR  2000 & 2004   left and right   SPINAL CORD STIMULATOR IMPLANT  03/1997   SPINAL CORD STIMULATOR REMOVAL  2007   Tendon repair right shoulder     TONSILLECTOMY     TOTAL KNEE ARTHROPLASTY  10/2011 & 09/13   left and right   Family History  Problem Relation Age of Onset   Heart disease Mother    Stroke Mother    Hypertension Mother    Hyperlipidemia Father    Heart disease Father    Heart disease Brother        2 brothers   Alzheimer's disease Brother    Heart disease Sister        pacemaker   Breast cancer Daughter 61   Social History   Socioeconomic History   Marital status: Widowed    Spouse name: Not on file   Number of children: 2   Years of education: Not on file   Highest education level: Not on file  Occupational History   Not on file  Tobacco Use   Smoking status: Former    Packs/day: 1.00    Years: 20.00    Pack years: 20.00    Types: Cigarettes    Quit date: 09/07/1988    Years since quitting: 32.9   Smokeless tobacco: Never  Vaping Use   Vaping Use: Never used  Substance and Sexual Activity   Alcohol use: Yes    Alcohol/week: 2.0 standard drinks    Types: 2 Glasses of wine per week    Comment: occasional   Drug use: No   Sexual activity: Never  Other Topics Concern   Not on file  Social History Narrative   She is widowed, has two daughters. [place]; quit smoking- in 1990s; social alcohol.    Social Determinants of Health   Financial Resource Strain: Not on file   Food Insecurity: Not on file  Transportation Needs: Not on file  Physical Activity: Not on file  Stress: Not on file  Social Connections: Not on file     Review of Systems  Constitutional:  Negative for appetite change and unexpected weight change.  HENT:  Negative for congestion and sinus pressure.        Globus sensation - throat.  Respiratory:  Negative for chest tightness and shortness of breath.   Cardiovascular:  Negative for chest pain, palpitations and leg swelling.  Gastrointestinal:  Negative for diarrhea, nausea and vomiting.       Intermittent RLQ pain as outlined.   Genitourinary:  Negative for difficulty urinating and dysuria.  Musculoskeletal:  Negative for joint swelling and myalgias.  Skin:  Negative for color change and rash.  Neurological:  Negative for dizziness, light-headedness and headaches.  Psychiatric/Behavioral:  Negative for agitation and dysphoric mood.       Objective:     BP 140/72   Pulse 64   Temp 98.6 F (37 C) (Temporal)   Ht 5\' 4"  (1.626 m)   Wt 147 lb (66.7 kg)   LMP 08/15/1978   SpO2 99%   BMI 25.23 kg/m  Wt Readings from Last 3 Encounters:  08/11/21 147 lb (66.7 kg)  06/18/21 149 lb 6.4 oz (67.8 kg)  05/16/21 151 lb 6.4 oz (68.7 kg)    Physical Exam Vitals reviewed.  Constitutional:      General: She is not in acute distress.    Appearance: Normal appearance.  HENT:     Head: Normocephalic and atraumatic.     Right Ear: External ear normal.     Left Ear: External ear normal.  Eyes:     General: No scleral icterus.       Right eye: No discharge.        Left eye: No discharge.     Conjunctiva/sclera: Conjunctivae normal.  Neck:     Thyroid: No thyromegaly.  Cardiovascular:     Rate and Rhythm: Normal rate and regular rhythm.  Pulmonary:     Effort: No respiratory distress.     Breath sounds: Normal breath sounds. No wheezing.  Abdominal:     General: Bowel sounds are normal.     Palpations: Abdomen is soft.      Tenderness: There is no abdominal tenderness.  Musculoskeletal:        General: No swelling or tenderness.     Cervical back: Neck supple. No tenderness.  Lymphadenopathy:     Cervical: No cervical adenopathy.  Skin:    Findings: No erythema or rash.  Neurological:     Mental Status: She is alert.  Psychiatric:        Mood and Affect: Mood normal.        Behavior: Behavior normal.     Outpatient Encounter Medications as of 08/11/2021  Medication Sig   amiodarone (PACERONE) 200 MG tablet daily.   cyanocobalamin 1000 MCG tablet Take 1,000 mcg by mouth.   DETROL LA 4 MG 24 hr capsule TAKE 1 CAPSULE DAILY   ELIQUIS 2.5 MG TABS tablet Take 2.5 mg by mouth 2 (two) times daily.   latanoprost (XALATAN) 0.005 % ophthalmic solution Place 1 drop into both eyes at bedtime.    Lifitegrast (XIIDRA) 5 % SOLN Place 1 drop into both eyes daily.   metoprolol succinate (TOPROL-XL) 25 MG 24 hr tablet Take 25 mg by mouth in the morning and at bedtime.   [DISCONTINUED] pantoprazole (PROTONIX) 40 MG tablet TAKE 1 TABLET TWICE A DAY (Patient taking differently: Take 40 mg by mouth 2 (two) times daily.)   calcitRIOL (ROCALTROL) 0.25 MCG capsule Take by mouth.   pantoprazole (PROTONIX) 40 MG tablet Take one tablet qod   [DISCONTINUED] dupilumab (DUPIXENT) 300 MG/2ML prefilled syringe Inject 300 mg into the skin every 14 (fourteen) days. Starting at day 15 for maintenance. (  Patient not taking: Reported on 08/11/2021)   [DISCONTINUED] metoprolol succinate (TOPROL-XL) 50 MG 24 hr tablet Take 50 mg by mouth at bedtime. Take with or immediately following a meal.   No facility-administered encounter medications on file as of 08/11/2021.     Lab Results  Component Value Date   WBC 5.2 06/18/2021   HGB 11.6 (L) 06/18/2021   HCT 37.4 06/18/2021   PLT 305 06/18/2021   GLUCOSE 81 08/11/2021   CHOL 245 (H) 08/11/2021   TRIG 147.0 08/11/2021   HDL 90.10 08/11/2021   LDLDIRECT 145.0 01/06/2021   LDLCALC 126 (H)  08/11/2021   ALT 14 08/11/2021   AST 18 08/11/2021   NA 139 08/11/2021   K 4.5 08/11/2021   CL 102 08/11/2021   CREATININE 1.19 08/11/2021   BUN 16 08/11/2021   CO2 28 08/11/2021   TSH 2.92 10/03/2020   INR 1.0 10/16/2013   HGBA1C 5.5 07/18/2019    DG ESOPHAGUS W DOUBLE CM (HD)  Result Date: 06/23/2021 CLINICAL DATA:  Pill dysphagia, globus sensation EXAM: ESOPHOGRAM / BARIUM SWALLOW / BARIUM TABLET STUDY TECHNIQUE: Combined double contrast and single contrast examination performed using effervescent crystals, thick barium liquid, and thin barium liquid. The patient was observed with fluoroscopy swallowing a 13 mm barium sulphate tablet. FLUOROSCOPY TIME:  Fluoroscopy Time:  1:36 COMPARISON:  None. FINDINGS: Normal oropharyngeal phase of swallow. No evidence of penetration or aspiration. Moderate esophageal dysmotility with tertiary contractions observed throughout. Normal contour and caliber of the esophagus. No evidence of mass or stricture. No evidence of gastroesophageal reflux. Normal partial double contrast appearance of the stomach and proximal small bowel. A swallowed 13 mm barium tablet passes readily through the gastroesophageal junction. IMPRESSION: 1. Moderate esophageal dysmotility, which can present with symptoms of pill dysphagia. 2.  Otherwise normal barium fluoroscopy. Electronically Signed   By: Delanna Ahmadi M.D.   On: 06/23/2021 10:23       Assessment & Plan:   Problem List Items Addressed This Visit     A-fib ([place])    On metoprolol.  On eliquis - dose just adjusted.  S/P ablation in September.        Abdominal pain    RLQ pain - minimal intermittent pain.  Better after bowel movement.  Discussed keeping bowels moving.  Discussed further w/up. Wants to monitor.        Anemia    Seeing Dr Rogue Bussing.  IV venofer.       Aortic aneurysm ([place])    Followed by cardiology.       CKD (chronic kidney disease) stage 3, GFR 30-59 ml/min (HCC)    Chronic and  stable Recheck metabolic panel today.  Avoid nephrotoxic meds Seeing nephrology.       GERD (gastroesophageal reflux disease)    No upper symptoms reported.  On protonix.        Relevant Medications   pantoprazole (PROTONIX) 40 MG tablet   Globus sensation    Globus sensation as outlined.  W/up as outlined.  Has seen GI.  EGD not recommended at this time.  Did recommend ENT evaluation.  She is agreeable.        Relevant Orders   Ambulatory referral to ENT   History of colonic polyps    Colonoscopy September 2020-1 tubular adenomatous polyp (hepatic flexure), diverticulosis and internal hemorrhoids.      Hypercholesterolemia    Previously on crestor.  She reports was told to stop by cardiology.  Low cholesterol diet and  exercise.  Follow lipid panel and liver function tests.        Relevant Orders   Hepatic function panel (Completed)   Lipid panel (Completed)   Basic metabolic panel (Completed)   Hypertension    Off losartan.  On metoprolol.  Blood pressures as outlined.  Continue metoprolol as she is doing.  Follow pressures.  Follow metabolic panel.       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  On eliquis.       Sleep apnea    Not using cpap.  States is claustrophobic.  Discussed reevaluation and other treatment options.  Will notify me if agreeable.       Sleep difficulties    Discussed.  Increase melatonin to 5-6mg  q hs.  Take regularly.  Follow.  Discussed sleep apnea and reevaluation.  Discussed treatment options.  Will notify me if desires further evaluation.        Stress    Overall appears to be handling things well.  Follow.       Other Visit Diagnoses     Need for immunization against influenza       Relevant Orders   Flu Vaccine QUAD High Dose(Fluad) (Completed)       I spent 45 minutes with the patient and more than 50% of the time was spent in consultation regarding the above.  Time spent discussing current concerns and  symptoms.  Time also spent discussing further evaluation and treatment.   Einar Pheasant, MD

## 2021-08-11 NOTE — Telephone Encounter (Signed)
I am not sure how extensive her exposure, but if she would like to come in for PCR test - covid - ok to order and schedule.  If she does not want to have checked here, do recommend at least doing a home test.

## 2021-08-12 ENCOUNTER — Encounter: Payer: Self-pay | Admitting: Internal Medicine

## 2021-08-12 ENCOUNTER — Ambulatory Visit: Payer: Medicare Other

## 2021-08-12 ENCOUNTER — Other Ambulatory Visit: Payer: Medicare Other

## 2021-08-12 DIAGNOSIS — R051 Acute cough: Secondary | ICD-10-CM

## 2021-08-12 DIAGNOSIS — G479 Sleep disorder, unspecified: Secondary | ICD-10-CM | POA: Insufficient documentation

## 2021-08-12 DIAGNOSIS — R0989 Other specified symptoms and signs involving the circulatory and respiratory systems: Secondary | ICD-10-CM | POA: Insufficient documentation

## 2021-08-12 DIAGNOSIS — D692 Other nonthrombocytopenic purpura: Secondary | ICD-10-CM | POA: Insufficient documentation

## 2021-08-12 NOTE — Assessment & Plan Note (Signed)
On metoprolol.  On eliquis - dose just adjusted.  S/P ablation in September.

## 2021-08-12 NOTE — Assessment & Plan Note (Signed)
History of afib.  On eliquis.   

## 2021-08-12 NOTE — Assessment & Plan Note (Signed)
Discussed.  Increase melatonin to 5-6mg  q hs.  Take regularly.  Follow.  Discussed sleep apnea and reevaluation.  Discussed treatment options.  Will notify me if desires further evaluation.

## 2021-08-12 NOTE — Assessment & Plan Note (Signed)
Off losartan.  On metoprolol.  Blood pressures as outlined.  Continue metoprolol as she is doing.  Follow pressures.  Follow metabolic panel.

## 2021-08-12 NOTE — Assessment & Plan Note (Signed)
Overall appears to be handling things well.  Follow.  ?

## 2021-08-12 NOTE — Assessment & Plan Note (Signed)
No upper symptoms reported.  On protonix.   

## 2021-08-12 NOTE — Assessment & Plan Note (Signed)
Not using cpap.  States is claustrophobic.  Discussed reevaluation and other treatment options.  Will notify me if agreeable.

## 2021-08-12 NOTE — Assessment & Plan Note (Signed)
Chronic and stable Recheck metabolic panel today.  Avoid nephrotoxic meds Seeing nephrology.

## 2021-08-12 NOTE — Assessment & Plan Note (Signed)
Colonoscopy September 2020-1 tubular adenomatous polyp (hepatic flexure), diverticulosis and internal hemorrhoids. 

## 2021-08-12 NOTE — Assessment & Plan Note (Signed)
Previously on crestor.  She reports was told to stop by cardiology.  Low cholesterol diet and exercise.  Follow lipid panel and liver function tests.

## 2021-08-12 NOTE — Assessment & Plan Note (Signed)
Seeing Dr Rogue Bussing.  IV venofer.

## 2021-08-12 NOTE — Assessment & Plan Note (Signed)
Followed by cardiology 

## 2021-08-12 NOTE — Assessment & Plan Note (Signed)
RLQ pain - minimal intermittent pain.  Better after bowel movement.  Discussed keeping bowels moving.  Discussed further w/up. Wants to monitor.

## 2021-08-12 NOTE — Assessment & Plan Note (Signed)
Globus sensation as outlined.  W/up as outlined.  Has seen GI.  EGD not recommended at this time.  Did recommend ENT evaluation.  She is agreeable.

## 2021-08-13 LAB — NOVEL CORONAVIRUS, NAA: SARS-CoV-2, NAA: DETECTED — AB

## 2021-08-13 MED ORDER — ROSUVASTATIN CALCIUM 10 MG PO TABS
10.0000 mg | ORAL_TABLET | Freq: Every day | ORAL | 3 refills | Status: DC
Start: 2021-08-13 — End: 2022-05-01

## 2021-08-13 NOTE — Addendum Note (Signed)
Addended by: Nanci Pina on: 08/13/2021 03:41 PM   Modules accepted: Orders

## 2021-08-14 DIAGNOSIS — Z20822 Contact with and (suspected) exposure to covid-19: Secondary | ICD-10-CM | POA: Diagnosis not present

## 2021-08-25 ENCOUNTER — Ambulatory Visit (INDEPENDENT_AMBULATORY_CARE_PROVIDER_SITE_OTHER): Payer: Medicare Other

## 2021-08-25 VITALS — Ht 64.0 in | Wt 147.0 lb

## 2021-08-25 DIAGNOSIS — Z0000 Encounter for general adult medical examination without abnormal findings: Secondary | ICD-10-CM

## 2021-08-25 NOTE — Patient Instructions (Addendum)
Ms. Leah Huber , Thank you for taking time to come for your Medicare Wellness Visit. I appreciate your ongoing commitment to your health goals. Please review the following plan we discussed and let me know if I can assist you in the future.   These are the goals we discussed:  Goals       Patient Stated     Maintain Healthy Lifestyle (pt-stated)      Stay active Healthy diet        This is a list of the screening recommended for you and due dates:  Health Maintenance  Topic Date Due   Mammogram  07/10/2021   COVID-19 Vaccine (3 - Pfizer risk series) 09/10/2021*   Zoster (Shingles) Vaccine (1 of 2) 11/23/2021*   Tetanus Vaccine  08/25/2022*   Colon Cancer Screening  05/21/2024   Pneumonia Vaccine  Completed   Flu Shot  Completed   DEXA scan (bone density measurement)  Completed   HPV Vaccine  Aged Out  *Topic was postponed. The date shown is not the original due date.   Advanced directives: on file  Conditions/risks identified: none new  Follow up in one year for your annual wellness visit    Preventive Care 65 Years and Older, Female Preventive care refers to lifestyle choices and visits with your health care provider that can promote health and wellness. What does preventive care include? A yearly physical exam. This is also called an annual well check. Dental exams once or twice a year. Routine eye exams. Ask your health care provider how often you should have your eyes checked. Personal lifestyle choices, including: Daily care of your teeth and gums. Regular physical activity. Eating a healthy diet. Avoiding tobacco and drug use. Limiting alcohol use. Practicing safe sex. Taking low-dose aspirin every day. Taking vitamin and mineral supplements as recommended by your health care provider. What happens during an annual well check? The services and screenings done by your health care provider during your annual well check will depend on your age, overall health,  lifestyle risk factors, and family history of disease. Counseling  Your health care provider may ask you questions about your: Alcohol use. Tobacco use. Drug use. Emotional well-being. Home and relationship well-being. Sexual activity. Eating habits. History of falls. Memory and ability to understand (cognition). Work and work Statistician. Reproductive health. Screening  You may have the following tests or measurements: Height, weight, and BMI. Blood pressure. Lipid and cholesterol levels. These may be checked every 5 years, or more frequently if you are over 97 years old. Skin check. Lung cancer screening. You may have this screening every year starting at age 67 if you have a 30-pack-year history of smoking and currently smoke or have quit within the past 15 years. Fecal occult blood test (FOBT) of the stool. You may have this test every year starting at age 35. Flexible sigmoidoscopy or colonoscopy. You may have a sigmoidoscopy every 5 years or a colonoscopy every 10 years starting at age 48. Hepatitis C blood test. Hepatitis B blood test. Sexually transmitted disease (STD) testing. Diabetes screening. This is done by checking your blood sugar (glucose) after you have not eaten for a while (fasting). You may have this done every 1-3 years. Bone density scan. This is done to screen for osteoporosis. You may have this done starting at age 24. Mammogram. This may be done every 1-2 years. Talk to your health care provider about how often you should have regular mammograms. Talk with your health  care provider about your test results, treatment options, and if necessary, the need for more tests. Vaccines  Your health care provider may recommend certain vaccines, such as: Influenza vaccine. This is recommended every year. Tetanus, diphtheria, and acellular pertussis (Tdap, Td) vaccine. You may need a Td booster every 10 years. Zoster vaccine. You may need this after age  68. Pneumococcal 13-valent conjugate (PCV13) vaccine. One dose is recommended after age 24. Pneumococcal polysaccharide (PPSV23) vaccine. One dose is recommended after age 46. Talk to your health care provider about which screenings and vaccines you need and how often you need them. This information is not intended to replace advice given to you by your health care provider. Make sure you discuss any questions you have with your health care provider. Document Released: 09/20/2015 Document Revised: 05/13/2016 Document Reviewed: 06/25/2015 Elsevier Interactive Patient Education  2017 [place] Prevention in the Home Falls can cause injuries. They can happen to people of all ages. There are many things you can do to make your home safe and to help prevent falls. What can I do on the outside of my home? Regularly fix the edges of walkways and driveways and fix any cracks. Remove anything that might make you trip as you walk through a door, such as a raised step or threshold. Trim any bushes or trees on the path to your home. Use bright outdoor lighting. Clear any walking paths of anything that might make someone trip, such as rocks or tools. Regularly check to see if handrails are loose or broken. Make sure that both sides of any steps have handrails. Any raised decks and porches should have guardrails on the edges. Have any leaves, snow, or ice cleared regularly. Use sand or salt on walking paths during winter. Clean up any spills in your garage right away. This includes oil or grease spills. What can I do in the bathroom? Use night lights. Install grab bars by the toilet and in the tub and shower. Do not use towel bars as grab bars. Use non-skid mats or decals in the tub or shower. If you need to sit down in the shower, use a plastic, non-slip stool. Keep the floor dry. Clean up any water that spills on the floor as soon as it happens. Remove soap buildup in the tub or shower  regularly. Attach bath mats securely with double-sided non-slip rug tape. Do not have throw rugs and other things on the floor that can make you trip. What can I do in the bedroom? Use night lights. Make sure that you have a light by your bed that is easy to reach. Do not use any sheets or blankets that are too big for your bed. They should not hang down onto the floor. Have a firm chair that has side arms. You can use this for support while you get dressed. Do not have throw rugs and other things on the floor that can make you trip. What can I do in the kitchen? Clean up any spills right away. Avoid walking on wet floors. Keep items that you use a lot in easy-to-reach places. If you need to reach something above you, use a strong step stool that has a grab bar. Keep electrical cords out of the way. Do not use floor polish or wax that makes floors slippery. If you must use wax, use non-skid floor wax. Do not have throw rugs and other things on the floor that can make you trip. What can  I do with my stairs? Do not leave any items on the stairs. Make sure that there are handrails on both sides of the stairs and use them. Fix handrails that are broken or loose. Make sure that handrails are as long as the stairways. Check any carpeting to make sure that it is firmly attached to the stairs. Fix any carpet that is loose or worn. Avoid having throw rugs at the top or bottom of the stairs. If you do have throw rugs, attach them to the floor with carpet tape. Make sure that you have a light switch at the top of the stairs and the bottom of the stairs. If you do not have them, ask someone to add them for you. What else can I do to help prevent falls? Wear shoes that: Do not have high heels. Have rubber bottoms. Are comfortable and fit you well. Are closed at the toe. Do not wear sandals. If you use a stepladder: Make sure that it is fully opened. Do not climb a closed stepladder. Make sure that  both sides of the stepladder are locked into place. Ask someone to hold it for you, if possible. Clearly mark and make sure that you can see: Any grab bars or handrails. First and last steps. Where the edge of each step is. Use tools that help you move around (mobility aids) if they are needed. These include: Canes. Walkers. Scooters. Crutches. Turn on the lights when you go into a dark area. Replace any light bulbs as soon as they burn out. Set up your furniture so you have a clear path. Avoid moving your furniture around. If any of your floors are uneven, fix them. If there are any pets around you, be aware of where they are. Review your medicines with your doctor. Some medicines can make you feel dizzy. This can increase your chance of falling. Ask your doctor what other things that you can do to help prevent falls. This information is not intended to replace advice given to you by your health care provider. Make sure you discuss any questions you have with your health care provider. Document Released: 06/20/2009 Document Revised: 01/30/2016 Document Reviewed: 09/28/2014 Elsevier Interactive Patient Education  2017 [employer].

## 2021-08-25 NOTE — Progress Notes (Signed)
Subjective:   Leah Huber is a 80 y.o. female who presents for Medicare Annual (Subsequent) preventive examination.  Review of Systems    No ROS.  Medicare Wellness Virtual Visit.  Visual/audio telehealth visit, UTA vital signs.   See social history for additional risk factors.   Cardiac Risk Factors include: advanced age (>28men, >18 women)     Objective:    Today's Vitals   08/25/21 1540  Weight: 147 lb (66.7 kg)  Height: 5\' 4"  (1.626 m)   Body mass index is 25.23 kg/m.  Advanced Directives 08/25/2021 06/18/2021 05/16/2021 04/30/2021 05/06/2020 05/22/2019 05/04/2019  Does Patient Have a Medical Advance Directive? Yes Yes No No Yes Yes Yes  Type of Paramedic of [place];Living will [place];Living will - - [place];Living will Living will [place];Living will  Does patient want to make changes to medical advance directive? No - Patient declined No - Patient declined - - No - Patient declined - No - Patient declined  Copy of Media in Chart? Yes - validated most recent copy scanned in chart (See row information) No - copy requested - - Yes - validated most recent copy scanned in chart (See row information) - No - copy requested  Would patient like information on creating a medical advance directive? - - No - Patient declined No - Patient declined - - -    Current Medications (verified) Outpatient Encounter Medications as of 08/25/2021  Medication Sig   calcitRIOL (ROCALTROL) 0.25 MCG capsule Take by mouth.   cyanocobalamin 1000 MCG tablet Take 1,000 mcg by mouth.   DETROL LA 4 MG 24 hr capsule TAKE 1 CAPSULE DAILY   ELIQUIS 2.5 MG TABS tablet Take 2.5 mg by mouth 2 (two) times daily.   latanoprost (XALATAN) 0.005 % ophthalmic solution Place 1 drop into both eyes at bedtime.    Lifitegrast (XIIDRA) 5 % SOLN Place 1 drop into both eyes daily.   metoprolol succinate  (TOPROL-XL) 25 MG 24 hr tablet Take 25 mg by mouth in the morning and at bedtime.   pantoprazole (PROTONIX) 40 MG tablet Take one tablet qod   rosuvastatin (CRESTOR) 10 MG tablet Take 1 tablet (10 mg total) by mouth daily.   [DISCONTINUED] amiodarone (PACERONE) 200 MG tablet daily.   No facility-administered encounter medications on file as of 08/25/2021.    Allergies (verified) Clindamycin/lincomycin, Dronedarone, Bactrim [sulfamethoxazole-trimethoprim], Morphine, Morphine and related, and Sulfa antibiotics   History: Past Medical History:  Diagnosis Date   Abnormal liver function test 07/23/2019   Anemia    Aortic aneurysm (HCC)    "SMALL"   Arthritis    neck to tailbone   Bilateral carpal tunnel syndrome 12/08/2017   Bronchitis    Chronic cough    @ NIGHT/ SEASONAL ALLERGIES   Clostridium difficile infection 2013-2014   Colon polyp    Concussion    Coronary artery disease    Dr Nehemiah Massed cardiologist   Depression    Diverticulosis    GERD (gastroesophageal reflux disease)    Glaucoma    Gout    Hemorrhoids    History of chicken pox    History of colonic polyps    History of shingles    Hypercholesterolemia    Hypertension    CONTROLLED ON MEDS   Hypovolemia 12/10   acute renal failure   IBS (irritable bowel syndrome)    Neuromuscular disorder ([place])    occasionally tingling  in hands/ NUMBNESS IN RIGHT FOOT s/p back surgery   Osteoarthritis    bilateral knees   Osteopenia    Pneumonia    HX OF   Sleep apnea    very mild   sleep study 8 yrs ago does not use CPAP   Urinary incontinence    Past Surgical History:  Procedure Laterality Date   ABDOMINAL HYSTERECTOMY  1979   ANTERIOR CERVICAL DECOMP/DISCECTOMY FUSION N/A 11/28/2014   Procedure: CERVICAL FIVE-SIX  ANTERIOR CERVICAL DECOMPRESSION/DISCECTOMY FUSION 1 LEVEL;  Surgeon: Newman Pies, MD;  Location: [facility];  Service: Neurosurgery;  Laterality: N/A;  C56 anterior cervical decompression with fusion  interbody prosthesis plating and bonegraft   BACK SURGERY     X2/ L4,5 & S1/ Cervical C7?   BLADDER SURGERY  1981   SUSPENSION   BREAST REDUCTION SURGERY     CARDIOVERSION N/A 10/29/2020   Procedure: CARDIOVERSION;  Surgeon: Corey Skains, MD;  Location: [facility];  Service: Cardiovascular;  Laterality: N/A;   CARPAL TUNNEL RELEASE  2007   CATARACT EXTRACTION W/PHACO Right 12/30/2016   Procedure: CATARACT EXTRACTION PHACO AND INTRAOCULAR LENS PLACEMENT (IOC)  Right toric lens;  Surgeon: Leandrew Koyanagi, MD;  Location: [place] [facility];  Service: Ophthalmology;  Laterality: Right;  sleep apnea, does not use CPAP   CATARACT EXTRACTION W/PHACO Left 01/20/2017   Procedure: CATARACT EXTRACTION PHACO AND INTRAOCULAR LENS PLACEMENT ([place])  Left Toric;  Surgeon: Leandrew Koyanagi, MD;  Location: [facility];  Service: Ophthalmology;  Laterality: Left;  toric lens   CHOLECYSTECTOMY  1996   COLONOSCOPY     COLONOSCOPY WITH PROPOFOL N/A 05/22/2019   Procedure: COLONOSCOPY WITH PROPOFOL;  Surgeon: Tol[place], Benay Pike, MD;  Location: [facility];  Service: Gastroenterology;  Laterality: N/A;   FOOT SURGERY  2005   JOINT REPLACEMENT Bilateral    KNEE   [place] Bilateral 2001   ROTATOR CUFF REPAIR  2000 & 2004   left and right   SPINAL CORD STIMULATOR IMPLANT  03/1997   SPINAL CORD STIMULATOR REMOVAL  2007   Tendon repair right shoulder     TONSILLECTOMY     TOTAL KNEE ARTHROPLASTY  10/2011 & 09/13   left and right   Family History  Problem Relation Age of Onset   Heart disease Mother    Stroke Mother    Hypertension Mother    Hyperlipidemia Father    Heart disease Father    Heart disease Brother        2 brothers   Alzheimer's disease Brother    Heart disease Sister        pacemaker   Breast cancer Daughter 37   Social History   Socioeconomic History   Marital status: Widowed    Spouse name: Not on file   Number of children:  2   Years of education: Not on file   Highest education level: Not on file  Occupational History   Not on file  Tobacco Use   Smoking status: Former    Packs/day: 1.00    Years: 20.00    Pack years: 20.00    Types: Cigarettes    Quit date: 09/07/1988    Years since quitting: 32.9   Smokeless tobacco: Never  Vaping Use   Vaping Use: Never used  Substance and Sexual Activity   Alcohol use: Yes    Alcohol/week: 2.0 standard drinks    Types: 2 Glasses of wine per week  Comment: occasional   Drug use: No   Sexual activity: Never  Other Topics Concern   Not on file  Social History Narrative   She is widowed, has two daughters. [place]; quit smoking- in 1990s; social alcohol.    Social Determinants of Health   Financial Resource Strain: Low Risk    Difficulty of Paying Living Expenses: Not hard at all  Food Insecurity: No Food Insecurity   Worried About Charity fundraiser in the Last Year: Never true   [place] in the Last Year: Never true  Transportation Needs: No Transportation Needs   Lack of Transportation (Medical): No   Lack of Transportation (Non-Medical): No  Physical Activity: Sufficiently Active   Days of Exercise per Week: 5 days   Minutes of Exercise per Session: 30 min  Stress: No Stress Concern Present   Feeling of Stress : Not at all  Social Connections: Unknown   Frequency of Communication with Friends and Family: More than three times a week   Frequency of Social Gatherings with Friends and Family: More than three times a week   Attends Religious Services: More than 4 times per year   Active Member of [employer] or Organizations: Yes   Attends Music therapist: More than 4 times per year   Marital Status: Not on file    Tobacco Counseling Counseling given: Not Answered   Clinical Intake:  Pre-visit preparation completed: Yes        Diabetes: No  How often do you need to have someone help you when you read instructions,  pamphlets, or other written materials from your doctor or pharmacy?: 1 - Never  Interpreter Needed?: No      Activities of Daily Living In your present state of health, do you have any difficulty performing the following activities: 08/25/2021 08/11/2021  Hearing? N N  Vision? N N  Difficulty concentrating or making decisions? N N  Walking or climbing stairs? N N  Dressing or bathing? N N  Doing errands, shopping? N N  Preparing Food and eating ? N -  Using the Toilet? N -  In the past six months, have you accidently leaked urine? N -  Do you have problems with loss of bowel control? N -  Managing your Medications? N -  Managing your Finances? N -  Housekeeping or managing your Housekeeping? N -  Some recent data might be hidden    Patient Care Team: Einar Pheasant, MD as PCP - General (Internal Medicine) Efrain Sella, MD as Consulting Physician (Gastroenterology) Cammie Sickle, MD as Consulting Physician (Internal Medicine)  Indicate any recent Medical Services you may have received from other than [facility] providers in the past year (date may be approximate).     Assessment:   This is a routine wellness examination for [facility].  Virtual Visit via Telephone Note  I connected with  Leah Huber on 08/25/21 at  3:30 PM EST by telephone and verified that I am speaking with the correct person using two identifiers.  Persons participating in the virtual visit: patient/Nurse Health Advisor   I discussed the limitations, risks, security and privacy concerns of performing an evaluation and management service by telephone and the availability of in person appointments. The patient expressed understanding and agreed to proceed.  Interactive audio and video telecommunications were attempted between this nurse and patient, however failed, due to patient having technical difficulties OR patient did not have access to video capability.  We continued and completed visit with  audio only.  Some vital signs may be absent or patient reported.   Hearing/Vision screen Hearing Screening - Comments:: Patient is able to hear conversational tones without difficulty. No issues reported.  Vision Screening - Comments:: Wears corrective lenses  Cataract extraction, bilateral  Glaucoma; drops in use  They have seen their ophthalmologist in the last 6 months.  Dietary issues and exercise activities discussed: Current Exercise Habits: Home exercise routine, Time (Minutes): 20, Frequency (Times/Week): 4, Weekly Exercise (Minutes/Week): 80, Intensity: Mild Regular diet Good water intake   Goals Addressed               This Visit's Progress     Patient Stated     Maintain Healthy Lifestyle (pt-stated)        Stay active Healthy diet       Depression Screen PHQ 2/9 Scores 08/25/2021 08/11/2021 05/06/2020 05/04/2019 10/26/2017 07/27/2016 11/08/2015  PHQ - 2 Score 0 0 0 0 0 0 0    Fall Risk Fall Risk  08/25/2021 08/11/2021 05/06/2020 02/08/2020 05/04/2019  Falls in the past year? 0 0 0 0 0  Comment - - - - -  Number falls in past yr: 0 0 0 - -  Comment - - - - -  Injury with Fall? - 0 - - -  Follow up Falls evaluation completed - Falls evaluation completed Falls evaluation completed -  Comment - - - - -    FALL RISK PREVENTION PERTAINING TO THE HOME: Home free of loose throw rugs in walkways, pet beds, electrical cords, etc? Yes  Adequate lighting in your home to reduce risk of falls? Yes   ASSISTIVE DEVICES UTILIZED TO PREVENT FALLS: Life alert? No  Use of a cane, walker or w/c? Yes , cane Grab bars in the bathroom? Yes  Shower chair or bench in shower? No Comfort chair height toilet? Yes   TIMED UP AND GO: Was the test performed? No .   Cognitive Function: Patient is alert and oriented x3.  Manages her own finances and medications.  Enjoys being social and reading.  MMSE - Mini Mental State Exam 09/18/2016  Orientation to time 5  Orientation to Place 5   Registration 3  Attention/ Calculation 5  Recall 3  Language- name 2 objects 2  Language- repeat 1  Language- follow 3 step command 3  Language- read & follow direction 1  Write a sentence 1  Copy design 1  Total score 30     6CIT Screen 05/04/2019 10/26/2017  What Year? 0 points 0 points  What month? 0 points 0 points  What time? 0 points 0 points  Count back from 20 0 points 0 points  Months in reverse 0 points 0 points  Repeat phrase 0 points 0 points  Total Score 0 0    Immunizations Immunization History  Administered Date(s) Administered   Fluad Quad(high Dose 65+) 05/30/2019, 06/04/2020, 08/11/2021   Influenza Split 06/29/2014   Influenza, High Dose Seasonal PF 07/27/2016, 06/27/2018   Influenza,inj,Quad PF,6+ Mos 06/18/2015   Influenza-Unspecified 06/18/2015, 07/27/2016, 05/22/2017   PFIZER(Purple Top)SARS-COV-2 Vaccination 09/12/2019, 10/03/2019   Pneumococcal Conjugate-13 07/30/2014   Pneumococcal Polysaccharide-23 03/18/2017   TDAP status: Due, Education has been provided regarding the importance of this vaccine. Advised may receive this vaccine at local pharmacy or Health Dept. Aware to provide a copy of the vaccination record if obtained from local pharmacy or Health Dept. Verbalized acceptance and understanding.  Shingrix Completed?:  No.    Education has been provided regarding the importance of this vaccine. Patient has been advised to call insurance company to determine out of pocket expense if they have not yet received this vaccine. Advised may also receive vaccine at local pharmacy or Health Dept. Verbalized acceptance and understanding.  Screening Tests Health Maintenance  Topic Date Due   MAMMOGRAM  07/10/2021   COVID-19 Vaccine (3 - Pfizer risk series) 09/10/2021 (Originally 10/31/2019)   Zoster Vaccines- Shingrix (1 of 2) 11/23/2021 (Originally 12/03/1959)   TETANUS/TDAP  08/25/2022 (Originally 12/03/1959)   COLONOSCOPY (Pts 45-59yrs Insurance coverage  will need to be confirmed)  05/21/2024   Pneumonia Vaccine 83+ Years old  Completed   INFLUENZA VACCINE  Completed   DEXA SCAN  Completed   HPV VACCINES  Aged Out   Health Maintenance Health Maintenance Due  Topic Date Due   MAMMOGRAM  07/10/2021   Hepatitis C Screening: does not qualify  Vision Screening: Recommended annual ophthalmology exams for early detection of glaucoma and other disorders of the eye.  Dental Screening: Recommended annual dental exams for proper oral hygiene  Community Resource Referral / Chronic Care Management: CRR required this visit?  No   CCM required this visit?  No      Plan:   Keep all routine maintenance appointments.   I have personally reviewed and noted the following in the patients chart:   Medical and social history Use of alcohol, tobacco or illicit drugs  Current medications and supplements including opioid prescriptions. Not taking opioid.  Functional ability and status Nutritional status Physical activity Advanced directives List of other physicians Hospitalizations, surgeries, and ER visits in previous 12 months Vitals Screenings to include cognitive, depression, and falls Referrals and appointments  In addition, I have reviewed and discussed with patient certain preventive protocols, quality metrics, and best practice recommendations. A written personalized care plan for preventive services as well as general preventive health recommendations were provided to patient.     Varney Biles, LPN   [id number]

## 2021-09-09 DIAGNOSIS — H6122 Impacted cerumen, left ear: Secondary | ICD-10-CM | POA: Diagnosis not present

## 2021-09-09 DIAGNOSIS — R1310 Dysphagia, unspecified: Secondary | ICD-10-CM | POA: Diagnosis not present

## 2021-09-09 DIAGNOSIS — K219 Gastro-esophageal reflux disease without esophagitis: Secondary | ICD-10-CM | POA: Diagnosis not present

## 2021-09-10 ENCOUNTER — Other Ambulatory Visit: Payer: Self-pay | Admitting: *Deleted

## 2021-09-10 DIAGNOSIS — E611 Iron deficiency: Secondary | ICD-10-CM

## 2021-09-16 ENCOUNTER — Inpatient Hospital Stay: Payer: Medicare Other

## 2021-09-16 ENCOUNTER — Inpatient Hospital Stay: Payer: Medicare Other | Attending: Internal Medicine

## 2021-09-16 ENCOUNTER — Other Ambulatory Visit: Payer: Self-pay

## 2021-09-16 ENCOUNTER — Inpatient Hospital Stay (HOSPITAL_BASED_OUTPATIENT_CLINIC_OR_DEPARTMENT_OTHER): Payer: Medicare Other | Admitting: Internal Medicine

## 2021-09-16 ENCOUNTER — Encounter: Payer: Self-pay | Admitting: Internal Medicine

## 2021-09-16 DIAGNOSIS — E611 Iron deficiency: Secondary | ICD-10-CM | POA: Diagnosis not present

## 2021-09-16 DIAGNOSIS — I4891 Unspecified atrial fibrillation: Secondary | ICD-10-CM | POA: Diagnosis not present

## 2021-09-16 DIAGNOSIS — D509 Iron deficiency anemia, unspecified: Secondary | ICD-10-CM | POA: Insufficient documentation

## 2021-09-16 DIAGNOSIS — Z87891 Personal history of nicotine dependence: Secondary | ICD-10-CM | POA: Diagnosis not present

## 2021-09-16 DIAGNOSIS — Z7901 Long term (current) use of anticoagulants: Secondary | ICD-10-CM | POA: Diagnosis not present

## 2021-09-16 LAB — CBC WITH DIFFERENTIAL/PLATELET
Abs Immature Granulocytes: 0.03 10*3/uL (ref 0.00–0.07)
Basophils Absolute: 0.1 10*3/uL (ref 0.0–0.1)
Basophils Relative: 1 %
Eosinophils Absolute: 0.1 10*3/uL (ref 0.0–0.5)
Eosinophils Relative: 2 %
HCT: 36.7 % (ref 36.0–46.0)
Hemoglobin: 12.1 g/dL (ref 12.0–15.0)
Immature Granulocytes: 0 %
Lymphocytes Relative: 36 %
Lymphs Abs: 2.5 10*3/uL (ref 0.7–4.0)
MCH: 34 pg (ref 26.0–34.0)
MCHC: 33 g/dL (ref 30.0–36.0)
MCV: 103.1 fL — ABNORMAL HIGH (ref 80.0–100.0)
Monocytes Absolute: 0.6 10*3/uL (ref 0.1–1.0)
Monocytes Relative: 9 %
Neutro Abs: 3.6 10*3/uL (ref 1.7–7.7)
Neutrophils Relative %: 52 %
Platelets: 287 10*3/uL (ref 150–400)
RBC: 3.56 MIL/uL — ABNORMAL LOW (ref 3.87–5.11)
RDW: 15.8 % — ABNORMAL HIGH (ref 11.5–15.5)
WBC: 6.9 10*3/uL (ref 4.0–10.5)
nRBC: 0 % (ref 0.0–0.2)

## 2021-09-16 LAB — BASIC METABOLIC PANEL
Anion gap: 10 (ref 5–15)
BUN: 25 mg/dL — ABNORMAL HIGH (ref 8–23)
CO2: 25 mmol/L (ref 22–32)
Calcium: 9 mg/dL (ref 8.9–10.3)
Chloride: 101 mmol/L (ref 98–111)
Creatinine, Ser: 1.14 mg/dL — ABNORMAL HIGH (ref 0.44–1.00)
GFR, Estimated: 48 mL/min — ABNORMAL LOW (ref >60)
Glucose, Bld: 87 mg/dL (ref 70–99)
Potassium: 4.1 mmol/L (ref 3.5–5.1)
Sodium: 136 mmol/L (ref 135–145)

## 2021-09-16 LAB — IRON AND TIBC
Iron: 117 ug/dL (ref 28–170)
Saturation Ratios: 26 % (ref 10.4–31.8)
TIBC: 444 ug/dL (ref 250–450)
UIBC: 327 ug/dL

## 2021-09-16 LAB — FERRITIN: Ferritin: 63 ng/mL (ref 11–307)

## 2021-09-16 NOTE — Progress Notes (Signed)
Tested covid + 08/13/2021.

## 2021-09-16 NOTE — Progress Notes (Signed)
[place] NOTE  Patient Care Team: Einar Pheasant, MD as PCP - General (Internal Medicine) Efrain Sella, MD as Consulting Physician (Gastroenterology) Cammie Sickle, MD as Consulting Physician (Internal Medicine)  CHIEF COMPLAINTS/PURPOSE OF CONSULTATION: ANEMIA  HEMATOLOGY HISTORY:  # AUG 2022:  SEVERE ANEMIA [hb-7; ]  EGD/Colonoscopy:2020- last colo-[facility]; Dr.Toledo]; Esophagogram- 2022- NEG for stricture. ? capsule  # CKD stage III [Dr.Korrapti]; A.fib [on eliquis on 2.5 mg BID;  Dr.Kowalski]    HISTORY OF PRESENTING ILLNESS: Alone. Ambulating with a cane.  Leah Huber 81 y.o.  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   Patient admits to improved energy levels.  She is not on oral iron.  Denies any blood in stools or black-colored stools.   Review of Systems  Constitutional:  Positive for malaise/fatigue. Negative for chills, diaphoresis, fever and weight loss.  HENT:  Negative for nosebleeds and sore throat.   Eyes:  Negative for double vision.  Respiratory:  Negative for cough, hemoptysis, sputum production and wheezing.   Cardiovascular:  Negative for chest pain, palpitations, orthopnea and leg swelling.  Gastrointestinal:  Positive for constipation. Negative for blood in stool, heartburn, melena and vomiting.  Genitourinary:  Negative for dysuria, frequency and urgency.  Musculoskeletal:  Positive for back pain and joint pain.  Skin: Negative.  Negative for itching and rash.  Neurological:  Negative for tingling, focal weakness, weakness and headaches.  Endo/Heme/Allergies:  Does not bruise/bleed easily.  Psychiatric/Behavioral:  Negative for depression. The patient is not nervous/anxious and does not have insomnia.    MEDICAL HISTORY:  Past Medical History:  Diagnosis Date   Abnormal liver function test 07/23/2019   Anemia    Aortic aneurysm (HCC)    "SMALL"   Arthritis    neck to  tailbone   Bilateral carpal tunnel syndrome 12/08/2017   Bronchitis    Chronic cough    @ NIGHT/ SEASONAL ALLERGIES   Clostridium difficile infection 2013-2014   Colon polyp    Concussion    Coronary artery disease    Dr Nehemiah Massed cardiologist   Depression    Diverticulosis    GERD (gastroesophageal reflux disease)    Glaucoma    Gout    Hemorrhoids    History of chicken pox    History of colonic polyps    History of shingles    Hypercholesterolemia    Hypertension    CONTROLLED ON MEDS   Hypovolemia 12/10   acute renal failure   IBS (irritable bowel syndrome)    Neuromuscular disorder (HCC)    occasionally tingling in hands/ NUMBNESS IN RIGHT FOOT s/p back surgery   Osteoarthritis    bilateral knees   Osteopenia    Pneumonia    HX OF   Sleep apnea    very mild   sleep study 8 yrs ago does not use CPAP   Urinary incontinence     SURGICAL HISTORY: Past Surgical History:  Procedure Laterality Date   ABDOMINAL HYSTERECTOMY  1979   ANTERIOR CERVICAL DECOMP/DISCECTOMY FUSION N/A 11/28/2014   Procedure: CERVICAL FIVE-SIX  ANTERIOR CERVICAL DECOMPRESSION/DISCECTOMY FUSION 1 LEVEL;  Surgeon: Newman Pies, MD;  Location: [place] [facility];  Service: Neurosurgery;  Laterality: N/A;  C56 anterior cervical decompression with fusion interbody prosthesis plating and bonegraft   BACK SURGERY     X2/ L4,5 & S1/ Cervical C7?   [place]  CARDIOVERSION N/A 10/29/2020   Procedure: CARDIOVERSION;  Surgeon: Corey Skains, MD;  Location: [facility];  Service: Cardiovascular;  Laterality: N/A;   CARPAL TUNNEL RELEASE  2007   CATARACT EXTRACTION W/PHACO Right 12/30/2016   Procedure: CATARACT EXTRACTION PHACO AND INTRAOCULAR LENS PLACEMENT ([place])  Right toric lens;  Surgeon: Leandrew Koyanagi, MD;  Location: [place];  Service: Ophthalmology;  Laterality: Right;  sleep apnea, does not use CPAP   CATARACT EXTRACTION W/PHACO Left  01/20/2017   Procedure: CATARACT EXTRACTION PHACO AND INTRAOCULAR LENS PLACEMENT ([place])  Left Toric;  Surgeon: Leandrew Koyanagi, MD;  Location: [place];  Service: Ophthalmology;  Laterality: Left;  toric lens   CHOLECYSTECTOMY  1996   COLONOSCOPY     COLONOSCOPY WITH PROPOFOL N/A 05/22/2019   Procedure: COLONOSCOPY WITH PROPOFOL;  Surgeon: Toledo, Benay Pike, MD;  Location: [facility];  Service: Gastroenterology;  Laterality: N/A;   FOOT SURGERY  2005   JOINT REPLACEMENT Bilateral    KNEE   [place] Bilateral 2001   ROTATOR CUFF REPAIR  2000 & 2004   left and right   SPINAL CORD STIMULATOR IMPLANT  03/1997   SPINAL CORD STIMULATOR REMOVAL  2007   Tendon repair right shoulder     TONSILLECTOMY     TOTAL KNEE ARTHROPLASTY  10/2011 & 09/13   left and right    SOCIAL HISTORY: Social History   Socioeconomic History   Marital status: Widowed    Spouse name: Not on file   Number of children: 2   Years of education: Not on file   Highest education level: Not on file  Occupational History   Not on file  Tobacco Use   Smoking status: Former    Packs/day: 1.00    Years: 20.00    Pack years: 20.00    Types: Cigarettes    Quit date: 09/07/1988    Years since quitting: 33.0   Smokeless tobacco: Never  Vaping Use   Vaping Use: Never used  Substance and Sexual Activity   Alcohol use: Yes    Alcohol/week: 2.0 standard drinks    Types: 2 Glasses of wine per week    Comment: occasional   Drug use: No   Sexual activity: Never  Other Topics Concern   Not on file  Social History Narrative   She is widowed, has two daughters. [place]; quit smoking- in 1990s; social alcohol.    Social Determinants of Health   Financial Resource Strain: Low Risk    Difficulty of Paying Living Expenses: Not hard at all  Food Insecurity: No Food Insecurity   Worried About Charity fundraiser in the Last Year: Never true   [place] in the  Last Year: Never true  Transportation Needs: No Transportation Needs   Lack of Transportation (Medical): No   Lack of Transportation (Non-Medical): No  Physical Activity: Sufficiently Active   Days of Exercise per Week: 5 days   Minutes of Exercise per Session: 30 min  Stress: No Stress Concern Present   Feeling of Stress : Not at all  Social Connections: Unknown   Frequency of Communication with Friends and Family: More than three times a week   Frequency of Social Gatherings with Friends and Family: More than three times a week   Attends Religious Services: More than 4 times per year   Active Member of [employer] or Organizations: Yes   Attends Archivist Meetings: More  than 4 times per year   Marital Status: Not on file  Intimate Partner Violence: Not At Risk   Fear of Current or Ex-Partner: No   Emotionally Abused: No   Physically Abused: No   Sexually Abused: No    FAMILY HISTORY: Family History  Problem Relation Age of Onset   Heart disease Mother    Stroke Mother    Hypertension Mother    Hyperlipidemia Father    Heart disease Father    Heart disease Brother        2 brothers   Alzheimer's disease Brother    Heart disease Sister        pacemaker   Breast cancer Daughter 27    ALLERGIES:  is allergic to clindamycin/lincomycin, dronedarone, bactrim [sulfamethoxazole-trimethoprim], morphine, morphine and related, and sulfa antibiotics.  MEDICATIONS:  Current Outpatient Medications  Medication Sig Dispense Refill   calcitRIOL (ROCALTROL) 0.25 MCG capsule Take by mouth.     cyanocobalamin 1000 MCG tablet Take 1,000 mcg by mouth.     DETROL LA 4 MG 24 hr capsule TAKE 1 CAPSULE DAILY 90 capsule 3   ELIQUIS 2.5 MG TABS tablet Take 2.5 mg by mouth 2 (two) times daily.     latanoprost (XALATAN) 0.005 % ophthalmic solution Place 1 drop into both eyes at bedtime.      Lifitegrast (XIIDRA) 5 % SOLN Place 1 drop into both eyes daily.     metoprolol succinate  (TOPROL-XL) 25 MG 24 hr tablet Take 25 mg by mouth in the morning and at bedtime.     pantoprazole (PROTONIX) 40 MG tablet Take one tablet qod (Patient taking differently: 40 mg daily. Take one tablet qod) 45 tablet 0   rosuvastatin (CRESTOR) 10 MG tablet Take 1 tablet (10 mg total) by mouth daily. 90 tablet 3   No current facility-administered medications for this visit.      PHYSICAL EXAMINATION:   Vitals:   09/16/21 1304  BP: (!) 136/120  Pulse: 72  Temp: 97.7 F (36.5 C)  SpO2: 99%   Filed Weights   09/16/21 1304  Weight: 146 lb (66.2 kg)    Physical Exam Vitals and nursing note reviewed.  HENT:     Head: Normocephalic and atraumatic.     Mouth/Throat:     Pharynx: Oropharynx is clear.  Eyes:     Extraocular Movements: Extraocular movements intact.     Pupils: Pupils are equal, round, and reactive to light.  Cardiovascular:     Rate and Rhythm: Normal rate and regular rhythm.  Pulmonary:     Comments: Decreased breath sounds bilaterally.  Abdominal:     Palpations: Abdomen is soft.  Musculoskeletal:        General: Normal range of motion.     Cervical back: Normal range of motion.  Skin:    General: Skin is warm.  Neurological:     General: No focal deficit present.     Mental Status: She is alert and oriented to person, place, and time.  Psychiatric:        Behavior: Behavior normal.        Judgment: Judgment normal.   LABORATORY DATA:  I have reviewed the data as listed Lab Results  Component Value Date   WBC 6.9 09/16/2021   HGB 12.1 09/16/2021   HCT 36.7 09/16/2021   MCV 103.1 (H) 09/16/2021   PLT 287 09/16/2021   Recent Labs    01/06/21 1409 02/17/21 1111 04/25/21 0759 06/18/21 1319 08/11/21 0272  09/16/21 1245  NA 138  --  139 137 139 136  K 4.7  --  4.0 4.1 4.5 4.1  CL 102  --  105 101 102 101  CO2 25  --  23 25 28 25   GLUCOSE 80  --  102* 95 81 87  BUN 40*  --  30* 23 16 25*  CREATININE 1.54*  --  1.45* 1.22* 1.19 1.14*  CALCIUM  9.5  --  9.6 9.2 9.7 9.0  GFRNONAA  --   --   --  45*  --  48*  PROT 6.6 6.8 6.7   7.0  --  6.6  --   ALBUMIN 4.5 4.5 4.4  --  4.4  --   AST 21 22 16   --  18  --   ALT 12 12 10   --  14  --   ALKPHOS 61 51 46  --  50  --   BILITOT 0.8 0.6 0.5  --  0.5  --   BILIDIR 0.1 0.1  --   --  0.1  --      No results found.  Iron deficiency #Anemia-iron deficiency; s/p IV venofer weekly x4.  Hemoglobin improved from 7 to 12. HOLD Venofer today. Recommend PO iron/gentle iron OTC.  # Etiology of iron deficiency: [Oct 2022] s/p [facility] evaluation; s/p  barium esophagogram.consider capsule  #A. fib [Dr.Thomas; [facility] ]-on Eliquis 2.5 mg twice daily- s/p ablation [[facility]-Oct 2022]-STABLE  # DISPOSITION: # HOLD venofer today;  # follow up in 4 month-  MD; labs- cbc/bmp/iron studies/ferritin- Possible venofer-Dr.B     All questions were answered. The patient knows to call the clinic with any problems, questions or concerns.      Cammie Sickle, MD 09/16/2021 1:24 PM

## 2021-09-16 NOTE — Assessment & Plan Note (Addendum)
#  Anemia-iron deficiency; s/p IV venofer weekly x4.  Hemoglobin improved from 7 to 12. HOLD Venofer today. Recommend PO iron/gentle iron OTC.  # Etiology of iron deficiency: [Oct 2022] s/p [facility] evaluation; s/p  barium esophagogram.consider capsule  #A. fib [Dr.Thomas; [facility] ]-on Eliquis 2.5 mg twice daily- s/p ablation [[facility] 20[facility]]-STABLE  # DISPOSITION: # HOLD venofer today;  # follow up in 4 month-  MD; labs- cbc/bmp/iron studies/ferritin- Possible venofer-Dr.B

## 2021-09-17 DIAGNOSIS — N1832 Chronic kidney disease, stage 3b: Secondary | ICD-10-CM | POA: Diagnosis not present

## 2021-09-17 DIAGNOSIS — D631 Anemia in chronic kidney disease: Secondary | ICD-10-CM | POA: Diagnosis not present

## 2021-09-17 DIAGNOSIS — E7800 Pure hypercholesterolemia, unspecified: Secondary | ICD-10-CM | POA: Diagnosis not present

## 2021-09-17 DIAGNOSIS — I10 Essential (primary) hypertension: Secondary | ICD-10-CM | POA: Diagnosis not present

## 2021-09-17 DIAGNOSIS — R809 Proteinuria, unspecified: Secondary | ICD-10-CM | POA: Diagnosis not present

## 2021-09-17 DIAGNOSIS — U071 COVID-19: Secondary | ICD-10-CM | POA: Diagnosis not present

## 2021-09-17 DIAGNOSIS — E538 Deficiency of other specified B group vitamins: Secondary | ICD-10-CM | POA: Diagnosis not present

## 2021-09-17 DIAGNOSIS — N281 Cyst of kidney, acquired: Secondary | ICD-10-CM | POA: Diagnosis not present

## 2021-09-17 DIAGNOSIS — N2581 Secondary hyperparathyroidism of renal origin: Secondary | ICD-10-CM | POA: Diagnosis not present

## 2021-09-18 ENCOUNTER — Other Ambulatory Visit: Payer: Self-pay | Admitting: Internal Medicine

## 2021-09-25 DIAGNOSIS — I25118 Atherosclerotic heart disease of native coronary artery with other forms of angina pectoris: Secondary | ICD-10-CM | POA: Diagnosis not present

## 2021-09-25 DIAGNOSIS — E782 Mixed hyperlipidemia: Secondary | ICD-10-CM | POA: Diagnosis not present

## 2021-09-25 DIAGNOSIS — Z79899 Other long term (current) drug therapy: Secondary | ICD-10-CM | POA: Diagnosis not present

## 2021-09-25 DIAGNOSIS — I10 Essential (primary) hypertension: Secondary | ICD-10-CM | POA: Diagnosis not present

## 2021-09-25 DIAGNOSIS — I480 Paroxysmal atrial fibrillation: Secondary | ICD-10-CM | POA: Diagnosis not present

## 2021-09-25 DIAGNOSIS — I7121 Aneurysm of the ascending aorta, without rupture: Secondary | ICD-10-CM | POA: Diagnosis not present

## 2021-09-25 DIAGNOSIS — I2510 Atherosclerotic heart disease of native coronary artery without angina pectoris: Secondary | ICD-10-CM | POA: Diagnosis not present

## 2021-09-25 DIAGNOSIS — I6523 Occlusion and stenosis of bilateral carotid arteries: Secondary | ICD-10-CM | POA: Diagnosis not present

## 2021-09-29 DIAGNOSIS — R682 Dry mouth, unspecified: Secondary | ICD-10-CM | POA: Diagnosis not present

## 2021-09-29 DIAGNOSIS — Z7901 Long term (current) use of anticoagulants: Secondary | ICD-10-CM | POA: Diagnosis not present

## 2021-09-29 DIAGNOSIS — Z86010 Personal history of colonic polyps: Secondary | ICD-10-CM | POA: Diagnosis not present

## 2021-09-29 DIAGNOSIS — Z9889 Other specified postprocedural states: Secondary | ICD-10-CM | POA: Diagnosis not present

## 2021-09-29 DIAGNOSIS — R0989 Other specified symptoms and signs involving the circulatory and respiratory systems: Secondary | ICD-10-CM | POA: Diagnosis not present

## 2021-09-29 DIAGNOSIS — D509 Iron deficiency anemia, unspecified: Secondary | ICD-10-CM | POA: Diagnosis not present

## 2021-09-29 DIAGNOSIS — R1310 Dysphagia, unspecified: Secondary | ICD-10-CM | POA: Diagnosis not present

## 2021-09-29 DIAGNOSIS — Z8679 Personal history of other diseases of the circulatory system: Secondary | ICD-10-CM | POA: Diagnosis not present

## 2021-10-21 ENCOUNTER — Other Ambulatory Visit: Payer: Self-pay | Admitting: Internal Medicine

## 2021-10-22 DIAGNOSIS — R1310 Dysphagia, unspecified: Secondary | ICD-10-CM | POA: Diagnosis not present

## 2021-10-27 DIAGNOSIS — Z20822 Contact with and (suspected) exposure to covid-19: Secondary | ICD-10-CM | POA: Diagnosis not present

## 2021-11-03 ENCOUNTER — Ambulatory Visit: Payer: Medicare Other | Admitting: Internal Medicine

## 2021-11-03 DIAGNOSIS — Z20822 Contact with and (suspected) exposure to covid-19: Secondary | ICD-10-CM | POA: Diagnosis not present

## 2021-11-13 DIAGNOSIS — Z20828 Contact with and (suspected) exposure to other viral communicable diseases: Secondary | ICD-10-CM | POA: Diagnosis not present

## 2021-11-17 ENCOUNTER — Ambulatory Visit: Payer: Medicare Other | Admitting: Internal Medicine

## 2021-11-18 ENCOUNTER — Other Ambulatory Visit: Payer: Self-pay

## 2021-11-18 ENCOUNTER — Ambulatory Visit (INDEPENDENT_AMBULATORY_CARE_PROVIDER_SITE_OTHER): Payer: Medicare Other | Admitting: Internal Medicine

## 2021-11-18 DIAGNOSIS — F439 Reaction to severe stress, unspecified: Secondary | ICD-10-CM

## 2021-11-18 DIAGNOSIS — Z86010 Personal history of colonic polyps: Secondary | ICD-10-CM | POA: Diagnosis not present

## 2021-11-18 DIAGNOSIS — I10 Essential (primary) hypertension: Secondary | ICD-10-CM | POA: Diagnosis not present

## 2021-11-18 DIAGNOSIS — E538 Deficiency of other specified B group vitamins: Secondary | ICD-10-CM

## 2021-11-18 DIAGNOSIS — I712 Thoracic aortic aneurysm, without rupture, unspecified: Secondary | ICD-10-CM | POA: Diagnosis not present

## 2021-11-18 DIAGNOSIS — E7800 Pure hypercholesterolemia, unspecified: Secondary | ICD-10-CM | POA: Diagnosis not present

## 2021-11-18 DIAGNOSIS — D692 Other nonthrombocytopenic purpura: Secondary | ICD-10-CM | POA: Diagnosis not present

## 2021-11-18 DIAGNOSIS — N1832 Chronic kidney disease, stage 3b: Secondary | ICD-10-CM | POA: Diagnosis not present

## 2021-11-18 DIAGNOSIS — K4090 Unilateral inguinal hernia, without obstruction or gangrene, not specified as recurrent: Secondary | ICD-10-CM

## 2021-11-18 DIAGNOSIS — D649 Anemia, unspecified: Secondary | ICD-10-CM | POA: Diagnosis not present

## 2021-11-18 DIAGNOSIS — I4891 Unspecified atrial fibrillation: Secondary | ICD-10-CM

## 2021-11-18 DIAGNOSIS — K219 Gastro-esophageal reflux disease without esophagitis: Secondary | ICD-10-CM | POA: Diagnosis not present

## 2021-11-18 NOTE — Progress Notes (Signed)
Patient ID: Leah Huber, female   DOB: September 02, 1941, 81 y.o.   MRN: [id number] ? ? ?Subjective:  ? ? Patient ID: Leah Huber, female    DOB: 1940-11-03, 81 y.o.   MRN: [id number] ? ?This visit occurred during the SARS-CoV-2 public health emergency.  Safety protocols were in place, including screening questions prior to the visit, additional usage of staff PPE, and extensive cleaning of exam room while observing appropriate contact time as indicated for disinfecting solutions.  ? ?Patient here for a scheduled follow up.  ? ?Chief Complaint  ?Patient presents with  ? Follow-up  ?  89mof/u - Pt reports having knot in groin/pubic area since having heart ablation. Pt states she is concerned about it.  ? .  ? ?HPI ?Here to follow up regarding afib, hypertension, anemia and cholesterol.  She reports noticing a "knot" in her groin.  Wants checked.  No pain.  Eating.  No chest pain or sob reported.  Feels from a heart standpoint - stable.  Saw GI 09/29/21. Protonix was decreased to '20mg'$  per day.  Appears to be doing well on this medication.  Back on losartan '25mg'$  q day.  Taking metoprolol.  Hgb improved - last check 12.  Followed by hematology.   ? ? ?Past Medical History:  ?Diagnosis Date  ? Abnormal liver function test 07/23/2019  ? Anemia   ? Aortic aneurysm ([place]   ? "SMALL"  ? Arthritis   ? neck to tailbone  ? Bilateral carpal tunnel syndrome 12/08/2017  ? Bronchitis   ? Chronic cough   ? @ NIGHT/ SEASONAL ALLERGIES  ? Clostridium difficile infection 2013-2014  ? Colon polyp   ? Concussion   ? Coronary artery disease   ? Dr KNehemiah Massedcardiologist  ? Depression   ? Diverticulosis   ? GERD (gastroesophageal reflux disease)   ? Glaucoma   ? Gout   ? Hemorrhoids   ? History of chicken pox   ? History of colonic polyps   ? History of shingles   ? Hypercholesterolemia   ? Hypertension   ? CONTROLLED ON MEDS  ? Hypovolemia 12/10  ? acute renal failure  ? IBS (irritable bowel syndrome)   ? Neuromuscular disorder ([place]   ?  occasionally tingling in hands/ NUMBNESS IN RIGHT FOOT s/p back surgery  ? Osteoarthritis   ? bilateral knees  ? Osteopenia   ? Pneumonia   ? HX OF  ? Sleep apnea   ? very mild   sleep study 8 yrs ago does not use CPAP  ? Urinary incontinence   ? ?Past Surgical History:  ?Procedure Laterality Date  ? ABDOMINAL HYSTERECTOMY  1979  ? ANTERIOR CERVICAL DECOMP/DISCECTOMY FUSION N/A 11/28/2014  ? Procedure: CERVICAL FIVE-SIX  ANTERIOR CERVICAL DECOMPRESSION/DISCECTOMY FUSION 1 LEVEL;  Surgeon: JNewman Pies MD;  [facility];  Service: Neurosurgery;  Laterality: N/A;  C56 anterior cervical decompression with fusion interbody prosthesis plating and bonegraft  ? BACK SURGERY    ? X2/ L4,5 & S1/ Cervical C7?  ? BLADDER SURGERY  1981  ? SUSPENSION  ? BREAST REDUCTION SURGERY    ? CARDIOVERSION N/A 10/29/2020  ? Procedure: CARDIOVERSION;  Surgeon: KCorey Skains MD;  Location: [facility];  Service: Cardiovascular;  Laterality: N/A;  ? CARPAL TUNNEL RELEASE  2007  ? CATARACT EXTRACTION W/PHACO Right 12/30/2016  ? Procedure: CATARACT EXTRACTION PHACO AND INTRAOCULAR LENS PLACEMENT ([facility])  Right toric lens;  Surgeon: CLeandrew Koyanagi MD;  [facility];  Service: Ophthalmology;  Laterality: Right;  sleep apnea, does not use CPAP  ? CATARACT EXTRACTION W/PHACO Left 01/20/2017  ? Procedure: CATARACT EXTRACTION PHACO AND INTRAOCULAR LENS PLACEMENT ([place])  Left Toric;  Surgeon: Leandrew Koyanagi, MD;  Location: [place] [facility];  Service: Ophthalmology;  Laterality: Left;  toric lens  ? CHOLECYSTECTOMY  1996  ? COLONOSCOPY    ? COLONOSCOPY WITH PROPOFOL N/A 05/22/2019  ? Procedure: COLONOSCOPY WITH PROPOFOL;  Surgeon: Toledo, Benay Pike, MD;  Location: [facility];  Service: Gastroenterology;  Laterality: N/A;  ? FOOT SURGERY  2005  ? JOINT REPLACEMENT Bilateral   ? KNEE  ? [place]  ? REDUCTION MAMMAPLASTY Bilateral 2001  ? [place] & 2004  ? left and right  ?  SPINAL CORD STIMULATOR IMPLANT  03/1997  ? SPINAL CORD STIMULATOR REMOVAL  2007  ? Tendon repair right shoulder    ? TONSILLECTOMY    ? TOTAL KNEE ARTHROPLASTY  10/2011 & 09/13  ? left and right  ? ?Family History  ?Problem Relation Age of Onset  ? Heart disease Mother   ? Stroke Mother   ? Hypertension Mother   ? Hyperlipidemia Father   ? Heart disease Father   ? Heart disease Brother   ?     2 brothers  ? Alzheimer's disease Brother   ? Heart disease Sister   ?     pacemaker  ? Breast cancer Daughter 43  ? ?Social History  ? ?Socioeconomic History  ? Marital status: Widowed  ?  Spouse name: Not on file  ? Number of children: 2  ? Years of education: Not on file  ? Highest education level: Not on file  ?Occupational History  ? Not on file  ?Tobacco Use  ? Smoking status: Former  ?  Packs/day: 1.00  ?  Years: 20.00  ?  Pack years: 20.00  ?  Types: Cigarettes  ?  Quit date: 09/07/1988  ?  Years since quitting: 33.2  ? Smokeless tobacco: Never  ?Vaping Use  ? Vaping Use: Never used  ?Substance and Sexual Activity  ? Alcohol use: Yes  ?  Alcohol/week: 2.0 standard drinks  ?  Types: 2 Glasses of wine per week  ?  Comment: occasional  ? Drug use: No  ? Sexual activity: Never  ?Other Topics Concern  ? Not on file  ?Social History Narrative  ? She is widowed, has two daughters. [place]; quit smoking- in 1990s; social alcohol.   ? ?Social Determinants of Health  ? ?Financial Resource Strain: Low Risk   ? Difficulty of Paying Living Expenses: Not hard at all  ?Food Insecurity: No Food Insecurity  ? Worried About Charity fundraiser in the Last Year: Never true  ? Ran Out of Food in the Last Year: Never true  ?Transportation Needs: No Transportation Needs  ? Lack of Transportation (Medical): No  ? Lack of Transportation (Non-Medical): No  ?Physical Activity: Sufficiently Active  ? Days of Exercise per Week: 5 days  ? Minutes of Exercise per Session: 30 min  ?Stress: No Stress Concern Present  ? Feeling of Stress : Not at all   ?Social Connections: Unknown  ? Frequency of Communication with Friends and Family: More than three times a week  ? Frequency of Social Gatherings with Friends and Family: More than three times a week  ? Attends Religious Services: More than 4 times per year  ? Active Member of  Clubs or Organizations: Yes  ? Attends Archivist Meetings: More than 4 times per year  ? Marital Status: Not on file  ? ? ? ?Review of Systems  ?Constitutional:  Negative for appetite change and unexpected weight change.  ?HENT:  Negative for congestion and sinus pressure.   ?Respiratory:  Negative for cough, chest tightness and shortness of breath.   ?Cardiovascular:  Negative for chest pain, palpitations and leg swelling.  ?Gastrointestinal:  Negative for abdominal pain, diarrhea, nausea and vomiting.  ?Genitourinary:  Negative for difficulty urinating and dysuria.  ?Musculoskeletal:  Negative for joint swelling and myalgias.  ?Skin:  Negative for color change and rash.  ?Neurological:  Negative for dizziness, light-headedness and headaches.  ?Psychiatric/Behavioral:  Negative for agitation and dysphoric mood.   ? ?   ?Objective:  ?  ? ?BP 132/82 (BP Location: Left Arm, Patient Position: Sitting, Cuff Size: Small)   Pulse 78   Temp 98.1 ?F (36.7 ?C) (Oral)   Resp 13   Ht '5\' 4"'$  (1.626 m)   Wt 149 lb (67.6 kg)   LMP 08/15/1978   SpO2 99%   BMI 25.58 kg/m?  ?Wt Readings from Last 3 Encounters:  ?11/18/21 149 lb (67.6 kg)  ?09/16/21 146 lb (66.2 kg)  ?08/25/21 147 lb (66.7 kg)  ? ? ?Physical Exam ?Vitals reviewed.  ?Constitutional:   ?   General: She is not in acute distress. ?   Appearance: Normal appearance.  ?HENT:  ?   Head: Normocephalic and atraumatic.  ?   Right Ear: External ear normal.  ?   Left Ear: External ear normal.  ?Eyes:  ?   General: No scleral icterus.    ?   Right eye: No discharge.     ?   Left eye: No discharge.  ?   Conjunctiva/sclera: Conjunctivae normal.  ?Neck:  ?   Thyroid: No thyromegaly.   ?Cardiovascular:  ?   Rate and Rhythm: Normal rate.  ?   Comments: Rated controlled.  ?Pulmonary:  ?   Effort: No respiratory distress.  ?   Breath sounds: Normal breath sounds. No wheezing.  ?Abdominal:  ?   General: Bowel soun

## 2021-11-21 ENCOUNTER — Other Ambulatory Visit: Payer: Self-pay

## 2021-11-21 ENCOUNTER — Ambulatory Visit
Admission: RE | Admit: 2021-11-21 | Discharge: 2021-11-21 | Disposition: A | Discharge status: Home / Self Care | Payer: Medicare Other | Source: Ambulatory Visit | Attending: Internal Medicine | Admitting: Internal Medicine

## 2021-11-21 DIAGNOSIS — Z1231 Encounter for screening mammogram for malignant neoplasm of breast: Secondary | ICD-10-CM | POA: Diagnosis not present

## 2021-11-23 ENCOUNTER — Encounter: Payer: Self-pay | Admitting: Internal Medicine

## 2021-11-23 DIAGNOSIS — K4090 Unilateral inguinal hernia, without obstruction or gangrene, not specified as recurrent: Secondary | ICD-10-CM | POA: Insufficient documentation

## 2021-11-23 NOTE — Assessment & Plan Note (Signed)
Appears to be c/w hernia.  No pain.  Easily reduced. Wants to monitor. Notify me if pain or problems.  Bowels moving.  ?

## 2021-11-23 NOTE — Assessment & Plan Note (Signed)
On crestor.  Low cholesterol diet and exercise.  Follow lipid panel and liver function tests.   

## 2021-11-23 NOTE — Assessment & Plan Note (Signed)
History of afib.  On eliquis.   

## 2021-11-23 NOTE — Assessment & Plan Note (Signed)
Stable ?Follow metabolic panel.   ?Avoid nephrotoxic meds ?Seeing nephrology.  ?

## 2021-11-23 NOTE — Assessment & Plan Note (Signed)
On metoprolol.  On eliquis - dose just adjusted.  S/P ablation in September.   ?

## 2021-11-23 NOTE — Assessment & Plan Note (Signed)
On metoprolol.  Back on losartan.  Discussed kidney protection with losartan.  Follow pressures.  Follow metabolic panel.  ?

## 2021-11-23 NOTE — Assessment & Plan Note (Signed)
Followed by cardiology 

## 2021-11-23 NOTE — Assessment & Plan Note (Signed)
Overall appears to be handling things well.  Follow.  ?

## 2021-11-23 NOTE — Assessment & Plan Note (Signed)
Follow B12 level.  

## 2021-11-23 NOTE — Assessment & Plan Note (Signed)
Colonoscopy September 2020-1 tubular adenomatous polyp (hepatic flexure), diverticulosis and internal hemorrhoids. 

## 2021-11-23 NOTE — Assessment & Plan Note (Signed)
Being followed by Dr Rogue Bussing.  IV venofer.  Last check - hgb 12.  Follow.  ?

## 2021-11-23 NOTE — Assessment & Plan Note (Signed)
On protonix '20mg'$  q day now.  No upper symptoms reported.  Follow.  ?

## 2021-12-11 DIAGNOSIS — Z20822 Contact with and (suspected) exposure to covid-19: Secondary | ICD-10-CM | POA: Diagnosis not present

## 2021-12-17 DIAGNOSIS — H401131 Primary open-angle glaucoma, bilateral, mild stage: Secondary | ICD-10-CM | POA: Diagnosis not present

## 2021-12-18 DIAGNOSIS — H43813 Vitreous degeneration, bilateral: Secondary | ICD-10-CM | POA: Diagnosis not present

## 2022-01-06 ENCOUNTER — Telehealth: Payer: Self-pay

## 2022-01-06 ENCOUNTER — Telehealth: Payer: Self-pay | Admitting: Internal Medicine

## 2022-01-06 NOTE — Telephone Encounter (Signed)
Access Nurse, [place], called to say patient needs to have an appointment with [place] within the next 3-4 hours.  Caryl Comes said that the patient's daughter, Selinda Eon, is with the patient and her number is [phone]. ? ?I saw that Einar Pheasant, MD, does not have an available slot for today and I went to check with my supervisor regarding next steps. ?

## 2022-01-06 NOTE — Telephone Encounter (Signed)
Patient's daughter called asking to speak to a nurse. She stated that her mother's BP was 90/50 and she was having shoulder pain. I sent her to Access Nurse.  ?

## 2022-01-06 NOTE — Telephone Encounter (Signed)
Patient feeling weak getting dressed feeling warm BP 87/47 at 11:30 did [facility] say not in afib she reports feeling fine , she has labs for kidney Dr. Marylene Buerger and see the kidney Dr.Wed. patient did have some bilateral shoulder pain but this has subsided. Patient BP did spike for few seconds  to 159/79, daughter who is RN says [facility] ECG completely normal refused ED . I advised shoulder pain returns ED is recommended because [facility] does not pickup all abnormal rhythms also advised hold BP meds until hears from provider. ?

## 2022-01-06 NOTE — Telephone Encounter (Signed)
Advised any new symptoms to be evaluated at ED to monitor BP and hold losartan if BP starts running high to call office for dosage of Losartan, rescheduled patient for Monday 2. Advised shoulder pain returns to seek medical attention. ?

## 2022-01-06 NOTE — Telephone Encounter (Signed)
Error

## 2022-01-06 NOTE — Telephone Encounter (Signed)
Given her history, I agree with need for evaluation.  She sees cardiology as well.  Recommend going ahead and being seen to confirm nothing acute going on.  ?

## 2022-01-06 NOTE — Telephone Encounter (Signed)
Didn't know the Access Nurse line was pick up by Onalee Hua. Pick up line and spoke with [place] from Print production planner... Herminie advised that Pt needed to be seen within 3-4 hours... Advised [place] that there are no available appts for today... Advised [place] that pt needs to go to ED Or [facility]... Pt stated that she will let them know... Supervisor later stated that call was suppose to go to head nurse Juliann Pulse... Didn't know that call was suppose to go to Candy Kitchen... Supervisor stated that Juliann Pulse will call pt back...  ?

## 2022-01-08 DIAGNOSIS — N281 Cyst of kidney, acquired: Secondary | ICD-10-CM | POA: Diagnosis not present

## 2022-01-08 DIAGNOSIS — Z20822 Contact with and (suspected) exposure to covid-19: Secondary | ICD-10-CM | POA: Diagnosis not present

## 2022-01-08 DIAGNOSIS — N2581 Secondary hyperparathyroidism of renal origin: Secondary | ICD-10-CM | POA: Diagnosis not present

## 2022-01-08 DIAGNOSIS — R809 Proteinuria, unspecified: Secondary | ICD-10-CM | POA: Diagnosis not present

## 2022-01-10 DIAGNOSIS — Z20822 Contact with and (suspected) exposure to covid-19: Secondary | ICD-10-CM | POA: Diagnosis not present

## 2022-01-11 DIAGNOSIS — Z20822 Contact with and (suspected) exposure to covid-19: Secondary | ICD-10-CM | POA: Diagnosis not present

## 2022-01-12 ENCOUNTER — Ambulatory Visit (INDEPENDENT_AMBULATORY_CARE_PROVIDER_SITE_OTHER): Payer: Medicare Other | Admitting: Internal Medicine

## 2022-01-12 ENCOUNTER — Encounter: Payer: Self-pay | Admitting: Internal Medicine

## 2022-01-12 VITALS — BP 134/80 | HR 74 | Temp 98.7°F | Resp 14 | Ht 64.0 in | Wt 147.0 lb

## 2022-01-12 DIAGNOSIS — I10 Essential (primary) hypertension: Secondary | ICD-10-CM

## 2022-01-12 DIAGNOSIS — N1832 Chronic kidney disease, stage 3b: Secondary | ICD-10-CM | POA: Diagnosis not present

## 2022-01-12 DIAGNOSIS — E7800 Pure hypercholesterolemia, unspecified: Secondary | ICD-10-CM

## 2022-01-12 DIAGNOSIS — I712 Thoracic aortic aneurysm, without rupture, unspecified: Secondary | ICD-10-CM | POA: Diagnosis not present

## 2022-01-12 DIAGNOSIS — N281 Cyst of kidney, acquired: Secondary | ICD-10-CM | POA: Diagnosis not present

## 2022-01-12 DIAGNOSIS — K219 Gastro-esophageal reflux disease without esophagitis: Secondary | ICD-10-CM | POA: Diagnosis not present

## 2022-01-12 DIAGNOSIS — D649 Anemia, unspecified: Secondary | ICD-10-CM

## 2022-01-12 DIAGNOSIS — D692 Other nonthrombocytopenic purpura: Secondary | ICD-10-CM

## 2022-01-12 DIAGNOSIS — N2581 Secondary hyperparathyroidism of renal origin: Secondary | ICD-10-CM | POA: Diagnosis not present

## 2022-01-12 DIAGNOSIS — R809 Proteinuria, unspecified: Secondary | ICD-10-CM | POA: Diagnosis not present

## 2022-01-12 DIAGNOSIS — I4891 Unspecified atrial fibrillation: Secondary | ICD-10-CM

## 2022-01-12 NOTE — Progress Notes (Signed)
Patient ID: Leah Huber, female   DOB: 01-23-1941, 81 y.o.   MRN: [id number] ? ? ?Subjective:  ? ? Patient ID: Leah Huber, female    DOB: October 23, 1940, 81 y.o.   MRN: [id number] ? ?This visit occurred during the SARS-CoV-2 public health emergency.  Safety protocols were in place, including screening questions prior to the visit, additional usage of staff PPE, and extensive cleaning of exam room while observing appropriate contact time as indicated for disinfecting solutions.  ? ?Patient here for work in appt.  ? ?Chief Complaint  ?Patient presents with  ? Follow-up  ?  Follow up after hypotension episode Friday  ? .  ? ?HPI ?Work in appt.  Reports last week, was feeling fine.  Went to use the bathroom (urinate) and returned to her recliner.  While sitting, reports she started feeling "hot".  No fever.  Very slight headache.  No dizziness or light headedness.  Reports today that blood pressure was 160/44 (and increased to 190/45).  Per phone message, reported blood pressure had dropped initially.  Pt reports her diastolic was low, but systolic elevated.  No chest pain.  Did note some discomfort across her upper back - under shoulder blades.  Took tylenol - relieved pain.  Eating.  No nausea or vomiting.  No abdominal pain.  Bowels stable.  Feels at her baseline now.  Had been instructed to hold losartan - given concern initially over low blood pressure.  Blood pressure - 140s this am.  States she has felt ok for the last few days.   ? ? ?Past Medical History:  ?Diagnosis Date  ? Abnormal liver function test 07/23/2019  ? Anemia   ? Aortic aneurysm ([place])   ? "SMALL"  ? Arthritis   ? neck to tailbone  ? Bilateral carpal tunnel syndrome 12/08/2017  ? Bronchitis   ? Chronic cough   ? @ NIGHT/ SEASONAL ALLERGIES  ? Clostridium difficile infection 2013-2014  ? Colon polyp   ? Concussion   ? Coronary artery disease   ? Dr Nehemiah Massed cardiologist  ? Depression   ? Diverticulosis   ? GERD (gastroesophageal reflux disease)   ?  Glaucoma   ? Gout   ? Hemorrhoids   ? History of chicken pox   ? History of colonic polyps   ? History of shingles   ? Hypercholesterolemia   ? Hypertension   ? CONTROLLED ON MEDS  ? Hypovolemia 12/10  ? acute renal failure  ? IBS (irritable bowel syndrome)   ? Neuromuscular disorder ([place])   ? occasionally tingling in hands/ NUMBNESS IN RIGHT FOOT s/p back surgery  ? Osteoarthritis   ? bilateral knees  ? Osteopenia   ? Pneumonia   ? HX OF  ? Sleep apnea   ? very mild   sleep study 8 yrs ago does not use CPAP  ? Urinary incontinence   ? ?Past Surgical History:  ?Procedure Laterality Date  ? ABDOMINAL HYSTERECTOMY  1979  ? ANTERIOR CERVICAL DECOMP/DISCECTOMY FUSION N/A 11/28/2014  ? Procedure: CERVICAL FIVE-SIX  ANTERIOR CERVICAL DECOMPRESSION/DISCECTOMY FUSION 1 LEVEL;  Surgeon: Newman Pies, MD;  Location: [facility];  Service: Neurosurgery;  Laterality: N/A;  C56 anterior cervical decompression with fusion interbody prosthesis plating and bonegraft  ? BACK SURGERY    ? X2/ L4,5 & S1/ Cervical C7?  ? BLADDER SURGERY  1981  ? SUSPENSION  ? BREAST REDUCTION SURGERY    ? CARDIOVERSION N/A 10/29/2020  ? Procedure: CARDIOVERSION;  Surgeon:  Corey Skains, MD;  Location: [facility];  Service: Cardiovascular;  Laterality: N/A;  ? CARPAL TUNNEL RELEASE  2007  ? CATARACT EXTRACTION W/PHACO Right 12/30/2016  ? Procedure: CATARACT EXTRACTION PHACO AND INTRAOCULAR LENS PLACEMENT (IOC)  Right toric lens;  Surgeon: Leandrew Koyanagi, MD;  Location: [facility];  Service: Ophthalmology;  Laterality: Right;  sleep apnea, does not use CPAP  ? CATARACT EXTRACTION W/PHACO Left 01/20/2017  ? Procedure: CATARACT EXTRACTION PHACO AND INTRAOCULAR LENS PLACEMENT ([place])  Left Toric;  Surgeon: Leandrew Koyanagi, MD;  Location: [facility];  Service: Ophthalmology;  Laterality: Left;  toric lens  ? CHOLECYSTECTOMY  1996  ? COLONOSCOPY    ? COLONOSCOPY WITH PROPOFOL N/A 05/22/2019  ? Procedure: COLONOSCOPY WITH PROPOFOL;   Surgeon: [place], Benay Pike, MD;  Location: [facility];  Service: Gastroenterology;  Laterality: N/A;  ? FOOT SURGERY  2005  ? JOINT REPLACEMENT Bilateral   ? KNEE  ? [place]  ? REDUCTION MAMMAPLASTY Bilateral 2001  ? [place] & 2004  ? left and right  ? SPINAL CORD STIMULATOR IMPLANT  03/1997  ? SPINAL CORD STIMULATOR REMOVAL  2007  ? Tendon repair right shoulder    ? TONSILLECTOMY    ? TOTAL KNEE ARTHROPLASTY  10/2011 & 09/13  ? left and right  ? ?Family History  ?Problem Relation Age of Onset  ? Heart disease Mother   ? Stroke Mother   ? Hypertension Mother   ? Hyperlipidemia Father   ? Heart disease Father   ? Heart disease Brother   ?     2 brothers  ? Alzheimer's disease Brother   ? Heart disease Sister   ?     pacemaker  ? Breast cancer Daughter 84  ? ?Social History  ? ?Socioeconomic History  ? Marital status: Widowed  ?  Spouse name: Not on file  ? Number of children: 2  ? Years of education: Not on file  ? Highest education level: Not on file  ?Occupational History  ? Not on file  ?Tobacco Use  ? Smoking status: Former  ?  Packs/day: 1.00  ?  Years: 20.00  ?  Pack years: 20.00  ?  Types: Cigarettes  ?  Quit date: 09/07/1988  ?  Years since quitting: 33.3  ? Smokeless tobacco: Never  ?Vaping Use  ? Vaping Use: Never used  ?Substance and Sexual Activity  ? Alcohol use: Yes  ?  Alcohol/week: 2.0 standard drinks  ?  Types: 2 Glasses of wine per week  ?  Comment: occasional  ? Drug use: No  ? Sexual activity: Never  ?Other Topics Concern  ? Not on file  ?Social History Narrative  ? She is widowed, has two daughters. [place]; quit smoking- in 1990s; social alcohol.   ? ?Social Determinants of Health  ? ?Financial Resource Strain: Low Risk   ? Difficulty of Paying Living Expenses: Not hard at all  ?Food Insecurity: No Food Insecurity  ? Worried About Charity fundraiser in the Last Year: Never true  ? Ran Out of Food in the Last Year: Never true  ?Transportation Needs: No  Transportation Needs  ? Lack of Transportation (Medical): No  ? Lack of Transportation (Non-Medical): No  ?Physical Activity: Sufficiently Active  ? Days of Exercise per Week: 5 days  ? Minutes of Exercise per Session: 30 min  ?Stress: No Stress Concern Present  ? Feeling of Stress : Not at  all  ?Social Connections: Unknown  ? Frequency of Communication with Friends and Family: More than three times a week  ? Frequency of Social Gatherings with Friends and Family: More than three times a week  ? Attends Religious Services: More than 4 times per year  ? Active Member of Clubs or Organizations: Yes  ? Attends Archivist Meetings: More than 4 times per year  ? Marital Status: Not on file  ? ? ? ?Review of Systems  ?Constitutional:  Negative for appetite change and unexpected weight change.  ?HENT:  Negative for congestion and sinus pressure.   ?Respiratory:  Negative for cough, chest tightness and shortness of breath.   ?Cardiovascular:  Negative for chest pain, palpitations and leg swelling.  ?Gastrointestinal:  Negative for abdominal pain, diarrhea, nausea and vomiting.  ?Genitourinary:  Negative for difficulty urinating and dysuria.  ?Musculoskeletal:  Negative for joint swelling and myalgias.  ?Skin:  Negative for color change and rash.  ?Neurological:  Negative for dizziness, light-headedness and headaches.  ?Psychiatric/Behavioral:  Negative for agitation and dysphoric mood.   ? ?   ?Objective:  ?  ? ?BP 134/80 (BP Location: Left Arm, Patient Position: Sitting, Cuff Size: Small)   Pulse 74   Temp 98.7 ?F (37.1 ?C) (Temporal)   Resp 14   Ht '5\' 4"'$  (1.626 m)   Wt 147 lb (66.7 kg)   LMP 08/15/1978   SpO2 99%   BMI 25.23 kg/m?  ?Wt Readings from Last 3 Encounters:  ?01/12/22 147 lb (66.7 kg)  ?11/18/21 149 lb (67.6 kg)  ?09/16/21 146 lb (66.2 kg)  ? ? ?Physical Exam ?Vitals reviewed.  ?Constitutional:   ?   General: She is not in acute distress. ?   Appearance: Normal appearance.  ?HENT:  ?   Head:  Normocephalic and atraumatic.  ?   Right Ear: External ear normal.  ?   Left Ear: External ear normal.  ?Eyes:  ?   General: No scleral icterus.    ?   Right eye: No discharge.     ?   Left eye: No discharge.  ?   Co

## 2022-01-12 NOTE — Patient Instructions (Signed)
Restart losartan '25mg'$  - one per day.   ?

## 2022-01-13 ENCOUNTER — Encounter: Payer: Self-pay | Admitting: Internal Medicine

## 2022-01-13 ENCOUNTER — Inpatient Hospital Stay: Payer: Medicare Other

## 2022-01-13 ENCOUNTER — Inpatient Hospital Stay (HOSPITAL_BASED_OUTPATIENT_CLINIC_OR_DEPARTMENT_OTHER): Payer: Medicare Other | Admitting: Internal Medicine

## 2022-01-13 ENCOUNTER — Inpatient Hospital Stay: Payer: Medicare Other | Attending: Internal Medicine

## 2022-01-13 VITALS — BP 142/79 | HR 70 | Temp 97.0°F | Resp 17

## 2022-01-13 DIAGNOSIS — E611 Iron deficiency: Secondary | ICD-10-CM

## 2022-01-13 DIAGNOSIS — D509 Iron deficiency anemia, unspecified: Secondary | ICD-10-CM | POA: Diagnosis not present

## 2022-01-13 DIAGNOSIS — N183 Chronic kidney disease, stage 3 unspecified: Secondary | ICD-10-CM | POA: Diagnosis not present

## 2022-01-13 DIAGNOSIS — M8580 Other specified disorders of bone density and structure, unspecified site: Secondary | ICD-10-CM | POA: Insufficient documentation

## 2022-01-13 LAB — CBC WITH DIFFERENTIAL/PLATELET
Abs Immature Granulocytes: 0.02 10*3/uL (ref 0.00–0.07)
Basophils Absolute: 0.1 10*3/uL (ref 0.0–0.1)
Basophils Relative: 1 %
Eosinophils Absolute: 0.1 10*3/uL (ref 0.0–0.5)
Eosinophils Relative: 2 %
HCT: 34 % — ABNORMAL LOW (ref 36.0–46.0)
Hemoglobin: 11.3 g/dL — ABNORMAL LOW (ref 12.0–15.0)
Immature Granulocytes: 0 %
Lymphocytes Relative: 39 %
Lymphs Abs: 2.5 10*3/uL (ref 0.7–4.0)
MCH: 35.2 pg — ABNORMAL HIGH (ref 26.0–34.0)
MCHC: 33.2 g/dL (ref 30.0–36.0)
MCV: 105.9 fL — ABNORMAL HIGH (ref 80.0–100.0)
Monocytes Absolute: 0.5 10*3/uL (ref 0.1–1.0)
Monocytes Relative: 8 %
Neutro Abs: 3.3 10*3/uL (ref 1.7–7.7)
Neutrophils Relative %: 50 %
Platelets: 258 10*3/uL (ref 150–400)
RBC: 3.21 MIL/uL — ABNORMAL LOW (ref 3.87–5.11)
RDW: 13.1 % (ref 11.5–15.5)
WBC: 6.5 10*3/uL (ref 4.0–10.5)
nRBC: 0 % (ref 0.0–0.2)

## 2022-01-13 LAB — FERRITIN: Ferritin: 39 ng/mL (ref 11–307)

## 2022-01-13 LAB — BASIC METABOLIC PANEL
Anion gap: 8 (ref 5–15)
BUN: 23 mg/dL (ref 8–23)
CO2: 23 mmol/L (ref 22–32)
Calcium: 9.1 mg/dL (ref 8.9–10.3)
Chloride: 104 mmol/L (ref 98–111)
Creatinine, Ser: 1.18 mg/dL — ABNORMAL HIGH (ref 0.44–1.00)
GFR, Estimated: 46 mL/min — ABNORMAL LOW (ref >60)
Glucose, Bld: 92 mg/dL (ref 70–99)
Potassium: 4.2 mmol/L (ref 3.5–5.1)
Sodium: 135 mmol/L (ref 135–145)

## 2022-01-13 LAB — IRON AND TIBC
Iron: 89 ug/dL (ref 28–170)
Saturation Ratios: 22 % (ref 10.4–31.8)
TIBC: 403 ug/dL (ref 250–450)
UIBC: 314 ug/dL

## 2022-01-13 MED ORDER — IRON SUCROSE 20 MG/ML IV SOLN
200.0000 mg | Freq: Once | INTRAVENOUS | Status: AC
  Administered 2022-01-13: 200 mg via INTRAVENOUS
  Filled 2022-01-13: qty 10

## 2022-01-13 MED ORDER — SODIUM CHLORIDE 0.9 % IV SOLN: 200.0000 mg | Freq: Once | INTRAVENOUS | Status: DC

## 2022-01-13 MED ORDER — SODIUM CHLORIDE 0.9 % IV SOLN
Freq: Once | INTRAVENOUS | Status: AC
  Filled 2022-01-13: qty 250

## 2022-01-13 NOTE — Assessment & Plan Note (Signed)
Being followed by Dr Rogue Bussing.  IV venofer.  Has f/u tomorrow. On oral iron.  Is constipating.  Plans to discuss tomorrow.  Discussed oral options.   ?

## 2022-01-13 NOTE — Assessment & Plan Note (Signed)
Has been followed by cardiology.  Symptoms as outlined.  Back to her normal baseline currently.  EKG as outlined.  Discussed f/u with cardiology.  Add back losartan for better blood pressure control.  Appt made to see Dr Nehemiah Massed this week.  ?

## 2022-01-13 NOTE — Assessment & Plan Note (Signed)
Is scheduled for regular follow up this week with nephrology.  Avoid nephrotoxins.  Add back losartan '25mg'$  q day.  Follow pressures.  Follow metabolic panel.  Labs just checked through nephrology.  ?

## 2022-01-13 NOTE — Assessment & Plan Note (Signed)
On metoprolol.  On eliquis - dose just adjusted.  S/P ablation in September. Overall appears to be stable.    

## 2022-01-13 NOTE — Assessment & Plan Note (Signed)
History of afib.  On eliquis.   

## 2022-01-13 NOTE — Progress Notes (Signed)
Patient denies new problems/concerns today.   °

## 2022-01-13 NOTE — Progress Notes (Signed)
[facility] ?CONSULT NOTE ? ?Patient Care Team: ?Einar Pheasant, MD as PCP - General (Internal Medicine) ?Alice Reichert, Benay Pike, MD as Consulting Physician (Gastroenterology) ?Cammie Sickle, MD as Consulting Physician (Internal Medicine) ? ?CHIEF COMPLAINTS/PURPOSE OF CONSULTATION: ANEMIA ? ?HEMATOLOGY HISTORY: ? ?# AUG 2022:  SEVERE ANEMIA [hb-7; ]  EGD/Colonoscopy:2020- last colo-42m [[facility] Dr.[place]]; Esophagogram- 2022- NEG for stricture. ? capsule ? ?# CKD stage III [Dr.Korrapti]; A.fib [on eliquis on 2.5 mg BID;  Dr.Kowalski] ? ? ? ?HISTORY OF PRESENTING ILLNESS: Alone. Ambulating independently. ? ?Leah YEAGER89y.o.  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  ? ?Patient had constipation needing enema after taking gentle iron.  She is currently off any oral  iron pills.  ? ?Patient admits to improved energy levels.  She is not on oral iron.  Denies any blood in stools or black-colored stools. ? ? ?Review of Systems  ?Constitutional:  Positive for malaise/fatigue. Negative for chills, diaphoresis, fever and weight loss.  ?HENT:  Negative for nosebleeds and sore throat.   ?Eyes:  Negative for double vision.  ?Respiratory:  Negative for cough, hemoptysis, sputum production and wheezing.   ?Cardiovascular:  Negative for chest pain, palpitations, orthopnea and leg swelling.  ?Gastrointestinal:  Positive for constipation. Negative for blood in stool, heartburn, melena and vomiting.  ?Genitourinary:  Negative for dysuria, frequency and urgency.  ?Musculoskeletal:  Positive for back pain and joint pain.  ?Skin: Negative.  Negative for itching and rash.  ?Neurological:  Negative for tingling, focal weakness, weakness and headaches.  ?Endo/Heme/Allergies:  Does not bruise/bleed easily.  ?Psychiatric/Behavioral:  Negative for depression. The patient is not nervous/anxious and does not have insomnia.   ? ?MEDICAL HISTORY:  ?Past Medical History:  ?Diagnosis Date  ??  Abnormal liver function test 07/23/2019  ?? Anemia   ?? [place]   ? "SMALL"  ?? Arthritis   ? neck to tailbone  ?? Bilateral carpal tunnel syndrome 12/08/2017  ?? Bronchitis   ?? Chronic cough   ? @ NIGHT/ SEASONAL ALLERGIES  ?? Clostridium difficile infection 2013-2014  ?? Colon polyp   ?? Concussion   ?? Coronary artery disease   ? Dr KNehemiah Massedcardiologist  ?? Depression   ?? Diverticulosis   ?? GERD (gastroesophageal reflux disease)   ?? Glaucoma   ?? Gout   ?? Hemorrhoids   ?? History of chicken pox   ?? History of colonic polyps   ?? History of shingles   ?? Hypercholesterolemia   ?? Hypertension   ? CONTROLLED ON MEDS  ?? Hypovolemia 12/10  ? acute renal failure  ?? IBS (irritable bowel syndrome)   ?? [place]rom[place]   ? occasionally tingling in hands/ NUMBNESS IN RIGHT FOOT s/p back surgery  ?? Osteoarthritis   ? bilateral knees  ?? Osteopenia   ?? Pneumonia   ? HX OF  ?? Sleep apnea   ? very mild   sleep study 8 yrs ago does not use CPAP  ?? Urinary incontinence   ? ? ?SURGICAL HISTORY: ?Past Surgical History:  ?Procedure Laterality Date  ?? ABDOMINAL HYSTERECTOMY  1979  ?? ANTERIOR CERVICAL DECOMP/DISCECTOMY FUSION N/A 11/28/2014  ? Procedure: CERVICAL FIVE-SIX  ANTERIOR CERVICAL DECOMPRESSION/DISCECTOMY FUSION 1 LEVEL;  Surgeon: JNewman Pies MD;  [facility];  Service: Neurosurgery;  Laterality: N/A;  C56 anterior cervical decompression with fusion interbody prosthesis plating and bonegraft  ?? BACK SURGERY    ? X2/ L4,5 & S1/  Cervical C7?  ?? BLADDER SURGERY  1981  ? SUSPENSION  ?? BREAST REDUCTION SURGERY    ?? CARDIOVERSION N/A 10/29/2020  ? Procedure: CARDIOVERSION;  Surgeon: Corey Skains, MD;  Location: [facility];  Service: Cardiovascular;  Laterality: N/A;  ?? CARPAL TUNNEL RELEASE  2007  ?? CATARACT EXTRACTION W/PHACO Right 12/30/2016  ? Procedure: CATARACT EXTRACTION PHACO AND INTRAOCULAR LENS PLACEMENT ([facility])  Right toric lens;  Surgeon: Leandrew Koyanagi,  MD;  Location: [facility];  Service: Ophthalmology;  Laterality: Right;  sleep apnea, does not use CPAP  ?? CATARACT EXTRACTION W/PHACO Left 01/20/2017  ? Procedure: CATARACT EXTRACTION PHACO AND INTRAOCULAR LENS PLACEMENT ([place])  Left Toric;  Surgeon: Leandrew Koyanagi, MD;  Location: [facility];  Service: Ophthalmology;  Laterality: Left;  toric lens  ?? CHOLECYSTECTOMY  1996  ?? COLONOSCOPY    ?? COLONOSCOPY WITH PROPOFOL N/A 05/22/2019  ? Procedure: COLONOSCOPY WITH PROPOFOL;  Surgeon: Toledo, Benay Pike, MD;  Location: [facility];  Service: Gastroenterology;  Laterality: N/A;  ?? FOOT SURGERY  2005  ?? JOINT REPLACEMENT Bilateral   ? KNEE  ?? [place]  ?? REDUCTION MAMMAPLASTY Bilateral 2001  ?? ROTATOR CUFF REPAIR  2000 & 2004  ? left and right  ?? SPINAL CORD STIMULATOR IMPLANT  03/1997  ?? SPINAL CORD STIMULATOR REMOVAL  2007  ?? Tendon repair right shoulder    ?? TONSILLECTOMY    ?? TOTAL KNEE ARTHROPLASTY  10/2011 & 09/13  ? left and right  ? ? ?SOCIAL HISTORY: ?Social History  ? ?Socioeconomic History  ?? Marital status: Widowed  ?  Spouse name: Not on file  ?? Number of children: 2  ?? Years of education: Not on file  ?? Highest education level: Not on file  ?Occupational History  ?? Not on file  ?Tobacco Use  ?? Smoking status: Former  ?  Packs/day: 1.00  ?  Years: 20.00  ?  Pack years: 20.00  ?  Types: Cigarettes  ?  Quit date: 09/07/1988  ?  Years since quitting: 33.3  ?? Smokeless tobacco: Never  ?Vaping Use  ?? Vaping Use: Never used  ?Substance and Sexual Activity  ?? Alcohol use: Yes  ?  Alcohol/week: 2.0 standard drinks  ?  Types: 2 Glasses of wine per week  ?  Comment: occasional  ?? Drug use: No  ?? Sexual activity: Never  ?Other Topics Concern  ?? Not on file  ?Social History Narrative  ? She is widowed, has two daughters. [place]; quit smoking- in 1990s; social alcohol.   ? ?Social Determinants of Health  ? ?Financial Resource Strain: Low Risk   ?? Difficulty  of Paying Living Expenses: Not hard at all  ?Food Insecurity: No Food Insecurity  ?? Worried About Charity fundraiser in the Last Year: Never true  ?? Ran Out of Food in the Last Year: Never true  ?Transportation Needs: No Transportation Needs  ?? Lack of Transportation (Medical): No  ?? Lack of Transportation (Non-Medical): No  ?Physical Activity: Sufficiently Active  ?? Days of Exercise per Week: 5 days  ?? Minutes of Exercise per Session: 30 min  ?Stress: No Stress Concern Present  ?? Feeling of Stress : Not at all  ?Social Connections: Unknown  ?? Frequency of Communication with Friends and Family: More than three times a week  ?? Frequency of Social Gatherings with Friends and Family: More than three times a week  ?? Attends Religious Services: More than 4  times per year  ?? Active Member of Clubs or Organizations: Yes  ?? Attends Archivist Meetings: More than 4 times per year  ?? Marital Status: Not on file  ?Intimate Partner Violence: Not At Risk  ?? Fear of Current or Ex-Partner: No  ?? Emotionally Abused: No  ?? Physically Abused: No  ?? Sexually Abused: No  ? ? ?FAMILY HISTORY: ?Family History  ?Problem Relation Age of Onset  ?? Heart disease Mother   ?? Stroke Mother   ?? Hypertension Mother   ?? Hyperlipidemia Father   ?? Heart disease Father   ?? Heart disease Brother   ?     2 brothers  ?? Alzheimer's disease Brother   ?? Heart disease Sister   ?     pacemaker  ?? Breast cancer Daughter 98  ? ? ?ALLERGIES:  is allergic to clindamycin/lincomycin, dronedarone, bactrim [sulfamethoxazole-trimethoprim], morphine, morphine and related, and sulfa antibiotics. ? ?MEDICATIONS:  ?Current Outpatient Medications  ?Medication Sig Dispense Refill  ?? calcitRIOL (ROCALTROL) 0.25 MCG capsule Take by mouth.    ?? cyanocobalamin 1000 MCG tablet Take 1,000 mcg by mouth.    ?? DETROL LA 4 MG 24 hr capsule TAKE 1 CAPSULE DAILY 90 capsule 3  ?? ELIQUIS 2.5 MG TABS tablet Take 2.5 mg by mouth 2 (two) times daily.     ?? latanoprost (XALATAN) 0.005 % ophthalmic solution Place 1 drop into both eyes at bedtime.     ?? Lifitegrast (XIIDRA) 5 % SOLN Place 1 drop into both eyes daily.    ?? losartan (COZAAR) 25 MG tab

## 2022-01-13 NOTE — Assessment & Plan Note (Signed)
On metoprolol.  Off losartan for the last several days.  Blood pressures as outlined.  Blood pressure today 138/82.  Feels back to baseline.  Given persistent slight increased in blood pressure, will restart losartan.   Follow pressures.  Follow metabolic panel.  Unclear etiology of recent acute episode.  Denied nausea, vomiting or diarrhea.  No abdominal pain or chest pain.  Did have pain - back/below shoulder blades.  No pain with exertion.  Breathing stable.  EKG - SR with no acute ischemic changes.  Given previous symptoms and history, will have cardiology evaluate - question of need for f/u ECHO/scanning.   ?

## 2022-01-13 NOTE — Assessment & Plan Note (Addendum)
#  Anemia-iron deficiency; s/p IV venofer weekly x4.  Hemoglobin improved from 7 to 12.  Intolerance PO gentle iron/ constipation.  Proceed with iron infusion today. ? ?# Macrocytosis- ? unclear etiology- B12 Sep 2022- 924; on b12 pills; hold off bone marrow biopsy. ? ?# Etiology of iron deficiency: [Oct 2022] s/p [facility] evaluation; s/p  barium esophagogram.consider capsule ? ?#A. fib [Dr.Thomas; [facility] ]-on Eliquis 2.5 mg twice daily- s/p ablation [[facility]-Oct 2022]-STABLE ? ?# CKD- Stage III- GFR 46- STABLE [Dr.korrapati] ? ?# DISPOSITION: ?# venofer today;  ?# follow up in 4 month-  MD; labs- cbc/bmp/iron studies/ferritin; B12 level- Possible venofer-Dr.B ?

## 2022-01-13 NOTE — Assessment & Plan Note (Signed)
On protonix '20mg'$  q day now.  No upper symptoms reported.  Follow.  ?

## 2022-01-13 NOTE — Assessment & Plan Note (Signed)
On crestor.  Low cholesterol diet and exercise.  Follow lipid panel and liver function tests.   

## 2022-01-14 DIAGNOSIS — E7800 Pure hypercholesterolemia, unspecified: Secondary | ICD-10-CM | POA: Diagnosis not present

## 2022-01-14 DIAGNOSIS — R809 Proteinuria, unspecified: Secondary | ICD-10-CM | POA: Diagnosis not present

## 2022-01-14 DIAGNOSIS — U071 COVID-19: Secondary | ICD-10-CM | POA: Diagnosis not present

## 2022-01-14 DIAGNOSIS — N281 Cyst of kidney, acquired: Secondary | ICD-10-CM | POA: Diagnosis not present

## 2022-01-14 DIAGNOSIS — N1832 Chronic kidney disease, stage 3b: Secondary | ICD-10-CM | POA: Diagnosis not present

## 2022-01-14 DIAGNOSIS — D631 Anemia in chronic kidney disease: Secondary | ICD-10-CM | POA: Diagnosis not present

## 2022-01-14 DIAGNOSIS — N2581 Secondary hyperparathyroidism of renal origin: Secondary | ICD-10-CM | POA: Diagnosis not present

## 2022-01-14 DIAGNOSIS — I10 Essential (primary) hypertension: Secondary | ICD-10-CM | POA: Diagnosis not present

## 2022-01-15 ENCOUNTER — Telehealth: Payer: Self-pay

## 2022-01-15 DIAGNOSIS — Z20822 Contact with and (suspected) exposure to covid-19: Secondary | ICD-10-CM | POA: Diagnosis not present

## 2022-01-15 NOTE — Telephone Encounter (Signed)
Leah Huber called to say she is scheduled to come in for labs on 01/20/2022, but she thought Dr. Einar Pheasant said she didn't need to keep this appointment.  Leah Huber would like to verify.  Please call. ?

## 2022-01-16 ENCOUNTER — Encounter: Payer: Self-pay | Admitting: Internal Medicine

## 2022-01-16 DIAGNOSIS — E782 Mixed hyperlipidemia: Secondary | ICD-10-CM | POA: Diagnosis not present

## 2022-01-16 DIAGNOSIS — I6523 Occlusion and stenosis of bilateral carotid arteries: Secondary | ICD-10-CM | POA: Diagnosis not present

## 2022-01-16 DIAGNOSIS — I10 Essential (primary) hypertension: Secondary | ICD-10-CM | POA: Diagnosis not present

## 2022-01-16 DIAGNOSIS — I480 Paroxysmal atrial fibrillation: Secondary | ICD-10-CM | POA: Diagnosis not present

## 2022-01-16 DIAGNOSIS — I2510 Atherosclerotic heart disease of native coronary artery without angina pectoris: Secondary | ICD-10-CM | POA: Diagnosis not present

## 2022-01-16 NOTE — Telephone Encounter (Signed)
I reviewed her outside labs.  Nephrology is following her kidney function and hematology is following her blood counts.  If she wants to hold on lab, I am ok with this.  We will need to check her cholesterol at some point in the future.  ?

## 2022-01-20 ENCOUNTER — Other Ambulatory Visit: Payer: Medicare Other

## 2022-01-22 ENCOUNTER — Ambulatory Visit (INDEPENDENT_AMBULATORY_CARE_PROVIDER_SITE_OTHER): Payer: Medicare Other | Admitting: Internal Medicine

## 2022-01-22 ENCOUNTER — Encounter: Payer: Self-pay | Admitting: Internal Medicine

## 2022-01-22 VITALS — BP 122/68 | HR 81 | Temp 97.4°F | Ht 63.9 in | Wt 147.0 lb

## 2022-01-22 DIAGNOSIS — I4891 Unspecified atrial fibrillation: Secondary | ICD-10-CM

## 2022-01-22 DIAGNOSIS — E7800 Pure hypercholesterolemia, unspecified: Secondary | ICD-10-CM | POA: Diagnosis not present

## 2022-01-22 DIAGNOSIS — Z86010 Personal history of colonic polyps: Secondary | ICD-10-CM

## 2022-01-22 DIAGNOSIS — N1832 Chronic kidney disease, stage 3b: Secondary | ICD-10-CM

## 2022-01-22 DIAGNOSIS — Z0000 Encounter for general adult medical examination without abnormal findings: Secondary | ICD-10-CM | POA: Diagnosis not present

## 2022-01-22 DIAGNOSIS — I10 Essential (primary) hypertension: Secondary | ICD-10-CM

## 2022-01-22 DIAGNOSIS — D692 Other nonthrombocytopenic purpura: Secondary | ICD-10-CM | POA: Diagnosis not present

## 2022-01-22 DIAGNOSIS — K219 Gastro-esophageal reflux disease without esophagitis: Secondary | ICD-10-CM

## 2022-01-22 DIAGNOSIS — M5489 Other dorsalgia: Secondary | ICD-10-CM

## 2022-01-22 DIAGNOSIS — D649 Anemia, unspecified: Secondary | ICD-10-CM | POA: Diagnosis not present

## 2022-01-22 DIAGNOSIS — I712 Thoracic aortic aneurysm, without rupture, unspecified: Secondary | ICD-10-CM | POA: Diagnosis not present

## 2022-01-22 DIAGNOSIS — F439 Reaction to severe stress, unspecified: Secondary | ICD-10-CM | POA: Diagnosis not present

## 2022-01-22 NOTE — Progress Notes (Signed)
Patient ID: Leah Huber, female   DOB: [birth year], 81 y.o.   MRN: [id number]   Subjective:    Patient ID: Leah Huber, female    DOB: 12-Sep-1940, 81 y.o.   MRN: [id number]   Patient here for her physical exam.   Chief Complaint  Patient presents with   Annual Exam   .   HPI Recently evaluated by cardiology 01/16/22 - back discomfort (center back).  Did not feel cardiac in origin.  Recommended no further cardiac w/up.  She describes pain - under shoulder blade.  Was questioning if crestor could be contributing.  Discussed stopping temporarily to see if contributing.  No chest pain.  Breathing stable.  No acid reflux.  No abdominal pain.  Bowels moving.  Noticed tremor - neck/shoulders.     Past Medical History:  Diagnosis Date   Abnormal liver function test 07/23/2019   Anemia    Aortic aneurysm (HCC)    "SMALL"   Arthritis    neck to tailbone   Bilateral carpal tunnel syndrome 12/08/2017   Bronchitis    Chronic cough    @ NIGHT/ SEASONAL ALLERGIES   Clostridium difficile infection 2013-2014   Colon polyp    Concussion    Coronary artery disease    Dr Nehemiah Massed cardiologist   Depression    Diverticulosis    GERD (gastroesophageal reflux disease)    Glaucoma    Gout    Hemorrhoids    History of chicken pox    History of colonic polyps    History of shingles    Hypercholesterolemia    Hypertension    CONTROLLED ON MEDS   Hypovolemia 12/10   acute renal failure   IBS (irritable bowel syndrome)    Neuromuscular disorder (HCC)    occasionally tingling in hands/ NUMBNESS IN RIGHT FOOT s/p back surgery   Osteoarthritis    bilateral knees   Osteopenia    Pneumonia    HX OF   Sleep apnea    very mild   sleep study 8 yrs ago does not use CPAP   Urinary incontinence    Past Surgical History:  Procedure Laterality Date   ABDOMINAL HYSTERECTOMY  1979   ANTERIOR CERVICAL DECOMP/DISCECTOMY FUSION N/A 11/28/2014   Procedure: CERVICAL FIVE-SIX  ANTERIOR CERVICAL  DECOMPRESSION/DISCECTOMY FUSION 1 LEVEL;  Surgeon: Newman Pies, MD;  Location: [facility];  Service: Neurosurgery;  Laterality: N/A;  C56 anterior cervical decompression with fusion interbody prosthesis plating and bonegraft   BACK SURGERY     X2/ L4,5 & S1/ Cervical C7?   BLADDER SURGERY  1981   SUSPENSION   BREAST REDUCTION SURGERY     CARDIOVERSION N/A 10/29/2020   Procedure: CARDIOVERSION;  Surgeon: Corey Skains, MD;  Location: [facility];  Service: Cardiovascular;  Laterality: N/A;   CARPAL TUNNEL RELEASE  2007   CATARACT EXTRACTION W/PHACO Right 12/30/2016   Procedure: CATARACT EXTRACTION PHACO AND INTRAOCULAR LENS PLACEMENT ([facility])  Right toric lens;  Surgeon: Leandrew Koyanagi, MD;  Location: [place];  Service: Ophthalmology;  Laterality: Right;  sleep apnea, does not use CPAP   CATARACT EXTRACTION W/PHACO Left 01/20/2017   Procedure: CATARACT EXTRACTION PHACO AND INTRAOCULAR LENS PLACEMENT ([place])  Left Toric;  Surgeon: Leandrew Koyanagi, MD;  Location: [place];  Service: Ophthalmology;  Laterality: Left;  toric lens   CHOLECYSTECTOMY  1996   COLONOSCOPY     COLONOSCOPY WITH PROPOFOL N/A 05/22/2019   Procedure: COLONOSCOPY WITH PROPOFOL;  Surgeon: Waverly, Le [place]  K, MD;  Location: [facility];  Service: Gastroenterology;  Laterality: N/A;   FOOT SURGERY  2005   JOINT REPLACEMENT Bilateral    KNEE   [place] Bilateral 2001   ROTATOR CUFF REPAIR  2000 & 2004   left and right   SPINAL CORD STIMULATOR IMPLANT  03/1997   SPINAL CORD STIMULATOR REMOVAL  2007   Tendon repair right shoulder     TONSILLECTOMY     TOTAL KNEE ARTHROPLASTY  10/2011 & 09/13   left and right   Family History  Problem Relation Age of Onset   Heart disease Mother    Stroke Mother    Hypertension Mother    Hyperlipidemia Father    Heart disease Father    Heart disease Brother        2 brothers   Alzheimer's disease Brother     Heart disease Sister        pacemaker   Breast cancer Daughter 58   Social History   Socioeconomic History   Marital status: Widowed    Spouse name: Not on file   Number of children: 2   Years of education: Not on file   Highest education level: Not on file  Occupational History   Not on file  Tobacco Use   Smoking status: Former    Packs/day: 1.00    Years: 20.00    Pack years: 20.00    Types: Cigarettes    Quit date: 09/07/1988    Years since quitting: 33.4   Smokeless tobacco: Never  Vaping Use   Vaping Use: Never used  Substance and Sexual Activity   Alcohol use: Yes    Alcohol/week: 2.0 standard drinks    Types: 2 Glasses of wine per week    Comment: occasional   Drug use: No   Sexual activity: Never  Other Topics Concern   Not on file  Social History Narrative   She is widowed, has two daughters. [place]; quit smoking- in 1990s; social alcohol.    Social Determinants of Health   Financial Resource Strain: Low Risk    Difficulty of Paying Living Expenses: Not hard at all  Food Insecurity: No Food Insecurity   Worried About Charity fundraiser in the Last Year: Never true   [place] in the Last Year: Never true  Transportation Needs: No Transportation Needs   Lack of Transportation (Medical): No   Lack of Transportation (Non-Medical): No  Physical Activity: Sufficiently Active   Days of Exercise per Week: 5 days   Minutes of Exercise per Session: 30 min  Stress: No Stress Concern Present   Feeling of Stress : Not at all  Social Connections: Unknown   Frequency of Communication with Friends and Family: More than three times a week   Frequency of Social Gatherings with Friends and Family: More than three times a week   Attends Religious Services: More than 4 times per year   Active Member of [employer] or Organizations: Yes   Attends Music therapist: More than 4 times per year   Marital Status: Not on file     Review of Systems   Constitutional:  Negative for appetite change and unexpected weight change.  HENT:  Negative for congestion, sinus pressure and sore throat.   Eyes:  Negative for pain and visual disturbance.  Respiratory:  Negative for cough, chest tightness and shortness of breath.   Cardiovascular:  Negative for  chest pain, palpitations and leg swelling.  Gastrointestinal:  Negative for abdominal pain, diarrhea, nausea and vomiting.  Genitourinary:  Negative for difficulty urinating and dysuria.  Musculoskeletal:  Positive for back pain. Negative for joint swelling and myalgias.  Skin:  Negative for color change and rash.  Neurological:  Negative for dizziness, light-headedness and headaches.  Hematological:  Negative for adenopathy. Does not bruise/bleed easily.  Psychiatric/Behavioral:  Negative for agitation and dysphoric mood.       Objective:     BP 122/68 (BP Location: Left Arm, Patient Position: Sitting, Cuff Size: Normal)   Pulse 81   Temp (!) 97.4 F (36.3 C) (Oral)   Ht 5' 3.9" (1.623 m)   Wt 147 lb (66.7 kg)   LMP 08/15/1978   SpO2 98%   BMI 25.31 kg/m  Wt Readings from Last 3 Encounters:  01/22/22 147 lb (66.7 kg)  01/13/22 149 lb 3.2 oz (67.7 kg)  01/12/22 147 lb (66.7 kg)    Physical Exam Vitals reviewed.  Constitutional:      General: She is not in acute distress.    Appearance: Normal appearance. She is well-developed.  HENT:     Head: Normocephalic and atraumatic.     Right Ear: External ear normal.     Left Ear: External ear normal.  Eyes:     General: No scleral icterus.       Right eye: No discharge.        Left eye: No discharge.     Conjunctiva/sclera: Conjunctivae normal.  Neck:     Thyroid: No thyromegaly.  Cardiovascular:     Rate and Rhythm: Normal rate and regular rhythm.  Pulmonary:     Effort: No tachypnea, accessory muscle usage or respiratory distress.     Breath sounds: Normal breath sounds. No decreased breath sounds or wheezing.  Chest:   Breasts:    Right: No inverted nipple, mass, nipple discharge or tenderness (no axillary adenopathy).     Left: No inverted nipple, mass, nipple discharge or tenderness (no axilarry adenopathy).  Abdominal:     General: Bowel sounds are normal.     Palpations: Abdomen is soft.     Tenderness: There is no abdominal tenderness.  Musculoskeletal:        General: No swelling or tenderness.     Cervical back: Neck supple.  Lymphadenopathy:     Cervical: No cervical adenopathy.  Skin:    Findings: No erythema or rash.  Neurological:     Mental Status: She is alert and oriented to person, place, and time.  Psychiatric:        Mood and Affect: Mood normal.        Behavior: Behavior normal.     Outpatient Encounter Medications as of 01/22/2022  Medication Sig   cyanocobalamin 1000 MCG tablet Take 1,000 mcg by mouth.   DETROL LA 4 MG 24 hr capsule TAKE 1 CAPSULE DAILY   ELIQUIS 2.5 MG TABS tablet Take 2.5 mg by mouth 2 (two) times daily.   latanoprost (XALATAN) 0.005 % ophthalmic solution Place 1 drop into both eyes at bedtime.    Lifitegrast (XIIDRA) 5 % SOLN Place 1 drop into both eyes daily.   losartan (COZAAR) 25 MG tablet Take 1 tablet by mouth daily.   metoprolol succinate (TOPROL-XL) 25 MG 24 hr tablet Take 25 mg by mouth in the morning and at bedtime.   pantoprazole (PROTONIX) 20 MG tablet One tablet qod   rosuvastatin (CRESTOR) 10 MG tablet Take  1 tablet (10 mg total) by mouth daily.   [DISCONTINUED] pantoprazole (PROTONIX) 40 MG tablet Take one tablet qod (Patient taking differently: 20 mg daily. Take one tablet qod)   [DISCONTINUED] calcitRIOL (ROCALTROL) 0.25 MCG capsule Take by mouth.   No facility-administered encounter medications on file as of 01/22/2022.     Lab Results  Component Value Date   WBC 6.5 01/13/2022   HGB 11.3 (L) 01/13/2022   HCT 34.0 (L) 01/13/2022   PLT 258 01/13/2022   GLUCOSE 92 01/13/2022   CHOL 245 (H) 08/11/2021   TRIG 147.0 08/11/2021   HDL  90.10 08/11/2021   LDLDIRECT 145.0 01/06/2021   LDLCALC 126 (H) 08/11/2021   ALT 14 08/11/2021   AST 18 08/11/2021   NA 135 01/13/2022   K 4.2 01/13/2022   CL 104 01/13/2022   CREATININE 1.18 (H) 01/13/2022   BUN 23 01/13/2022   CO2 23 01/13/2022   TSH 2.92 10/03/2020   INR 1.0 10/16/2013   HGBA1C 5.5 07/18/2019    MM 3D SCREEN BREAST BILATERAL  Result Date: 11/22/2021 CLINICAL DATA:  Screening. EXAM: DIGITAL SCREENING BILATERAL MAMMOGRAM WITH TOMOSYNTHESIS AND CAD TECHNIQUE: Bilateral screening digital craniocaudal and mediolateral oblique mammograms were obtained. Bilateral screening digital breast tomosynthesis was performed. The images were evaluated with computer-aided detection. COMPARISON:  Previous exam(s). ACR Breast Density Category b: There are scattered areas of fibroglandular density. FINDINGS: There are no findings suspicious for malignancy. IMPRESSION: No mammographic evidence of malignancy. A result letter of this screening mammogram will be mailed directly to the patient. RECOMMENDATION: Screening mammogram in one year. (Code:SM-B-01Y) BI-RADS CATEGORY  1: Negative. Electronically Signed   By: Ammie Ferrier M.D.   On: 11/22/2021 09:44       Assessment & Plan:   Problem List Items Addressed This Visit     A-fib ([place])    On metoprolol.  On eliquis - dose just adjusted.  S/P ablation in September. Overall appears to be stable.          Anemia    Being followed by Dr Rogue Bussing.  IV venofer prn.  Follow [facility].        Aortic aneurysm [facility])    Has been followed by cardiology.  Just evaluated.        Back pain    Back pain  - pain under shoulder blades.  Saw cardiology. No further w/up warranted.  She is questioning whether or not crestor contributing.  Will stop crestor temporarily.  Call with update over the next 2 weeks.  Desires to start with this first and further work up pending response.         CKD (chronic kidney disease) stage 3, GFR 30-59 ml/min  (HCC)    Avoid nephrotoxins.  On losartan. Follow pressures.  Follow metabolic panel.        GERD (gastroesophageal reflux disease)    On protonix '20mg'$  qod now.  Symptoms controlled.  Follow.        Relevant Medications   pantoprazole (PROTONIX) 20 MG tablet   Health care maintenance    Physical today 01/22/22.  Mammogram 11/21/21 - Birads I.  Colonoscopy 05/22/19.         History of colonic polyps    Colonoscopy September 2020-1 tubular adenomatous polyp (hepatic flexure), diverticulosis and internal hemorrhoids.       Hypercholesterolemia    On crestor.  Low cholesterol diet and exercise.  Follow lipid panel and liver function tests.  Hypertension    Continue on losartan and metoprolol.  Blood pressure doing well.  Same medication regimen.  Follow.         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  On eliquis.        Stress    Overall appears to be handling things well.  Follow.        Other Visit Diagnoses     Routine general medical examination at a health care facility    -  Primary        Einar Pheasant, MD

## 2022-01-22 NOTE — Assessment & Plan Note (Signed)
Physical today 01/22/22.  Mammogram 11/21/21 - Birads I.  Colonoscopy 05/22/19.

## 2022-01-22 NOTE — Patient Instructions (Signed)
Hold the crestor

## 2022-02-02 ENCOUNTER — Encounter: Payer: Self-pay | Admitting: Internal Medicine

## 2022-02-02 DIAGNOSIS — M549 Dorsalgia, unspecified: Secondary | ICD-10-CM | POA: Insufficient documentation

## 2022-02-02 MED ORDER — PANTOPRAZOLE SODIUM 20 MG PO TBEC: DELAYED_RELEASE_TABLET | ORAL | 0 refills | Status: DC

## 2022-02-02 NOTE — Assessment & Plan Note (Signed)
On metoprolol.  On eliquis - dose just adjusted.  S/P ablation in September. Overall appears to be stable.    

## 2022-02-02 NOTE — Assessment & Plan Note (Signed)
On protonix 20mg qod now.  Symptoms controlled.  Follow.  

## 2022-02-02 NOTE — Assessment & Plan Note (Signed)
Overall appears to be handling things well.  Follow.  ?

## 2022-02-02 NOTE — Assessment & Plan Note (Signed)
History of afib.  On eliquis.   

## 2022-02-02 NOTE — Assessment & Plan Note (Signed)
Colonoscopy September 2020-1 tubular adenomatous polyp (hepatic flexure), diverticulosis and internal hemorrhoids. 

## 2022-02-02 NOTE — Assessment & Plan Note (Signed)
Continue on losartan and metoprolol.  Blood pressure doing well.  Same medication regimen.  Follow.   

## 2022-02-02 NOTE — Assessment & Plan Note (Signed)
Has been followed by cardiology.  Just evaluated.

## 2022-02-02 NOTE — Assessment & Plan Note (Signed)
Avoid nephrotoxins.  On losartan. Follow pressures.  Follow metabolic panel.

## 2022-02-02 NOTE — Assessment & Plan Note (Signed)
On crestor.  Low cholesterol diet and exercise.  Follow lipid panel and liver function tests.   

## 2022-02-02 NOTE — Assessment & Plan Note (Signed)
Back pain  - pain under shoulder blades.  Saw cardiology. No further w/up warranted.  She is questioning whether or not crestor contributing.  Will stop crestor temporarily.  Call with update over the next 2 weeks.  Desires to start with this first and further work up pending response.

## 2022-02-02 NOTE — Assessment & Plan Note (Signed)
Being followed by Dr Brahmanday.  IV venofer prn.  Follow [facility].  

## 2022-03-03 ENCOUNTER — Encounter: Payer: Self-pay | Admitting: Internal Medicine

## 2022-03-04 ENCOUNTER — Telehealth: Payer: Self-pay | Admitting: Internal Medicine

## 2022-03-04 NOTE — Telephone Encounter (Signed)
Lm to let patient know that her appointment on 01/22/2021 was a physical. Tricare covered their part, but Medicare does not cover a physical with her provider ,only a Medicare AWV is covered.

## 2022-03-30 DIAGNOSIS — Z86010 Personal history of colonic polyps: Secondary | ICD-10-CM | POA: Diagnosis not present

## 2022-03-30 DIAGNOSIS — D509 Iron deficiency anemia, unspecified: Secondary | ICD-10-CM | POA: Diagnosis not present

## 2022-03-30 DIAGNOSIS — K224 Dyskinesia of esophagus: Secondary | ICD-10-CM | POA: Diagnosis not present

## 2022-03-30 DIAGNOSIS — R0989 Other specified symptoms and signs involving the circulatory and respiratory systems: Secondary | ICD-10-CM | POA: Diagnosis not present

## 2022-04-21 ENCOUNTER — Other Ambulatory Visit: Payer: Medicare Other

## 2022-04-21 IMAGING — DX DG LUMBAR SPINE 2-3V
3 series · 3 of 3 positions shown · non-contrast
Comparison: None

CLINICAL DATA: Low back pain without sciatica, no history of
trauma/fall

EXAM:
LUMBAR SPINE - 2-3 VIEW

[lumbar spine ap]
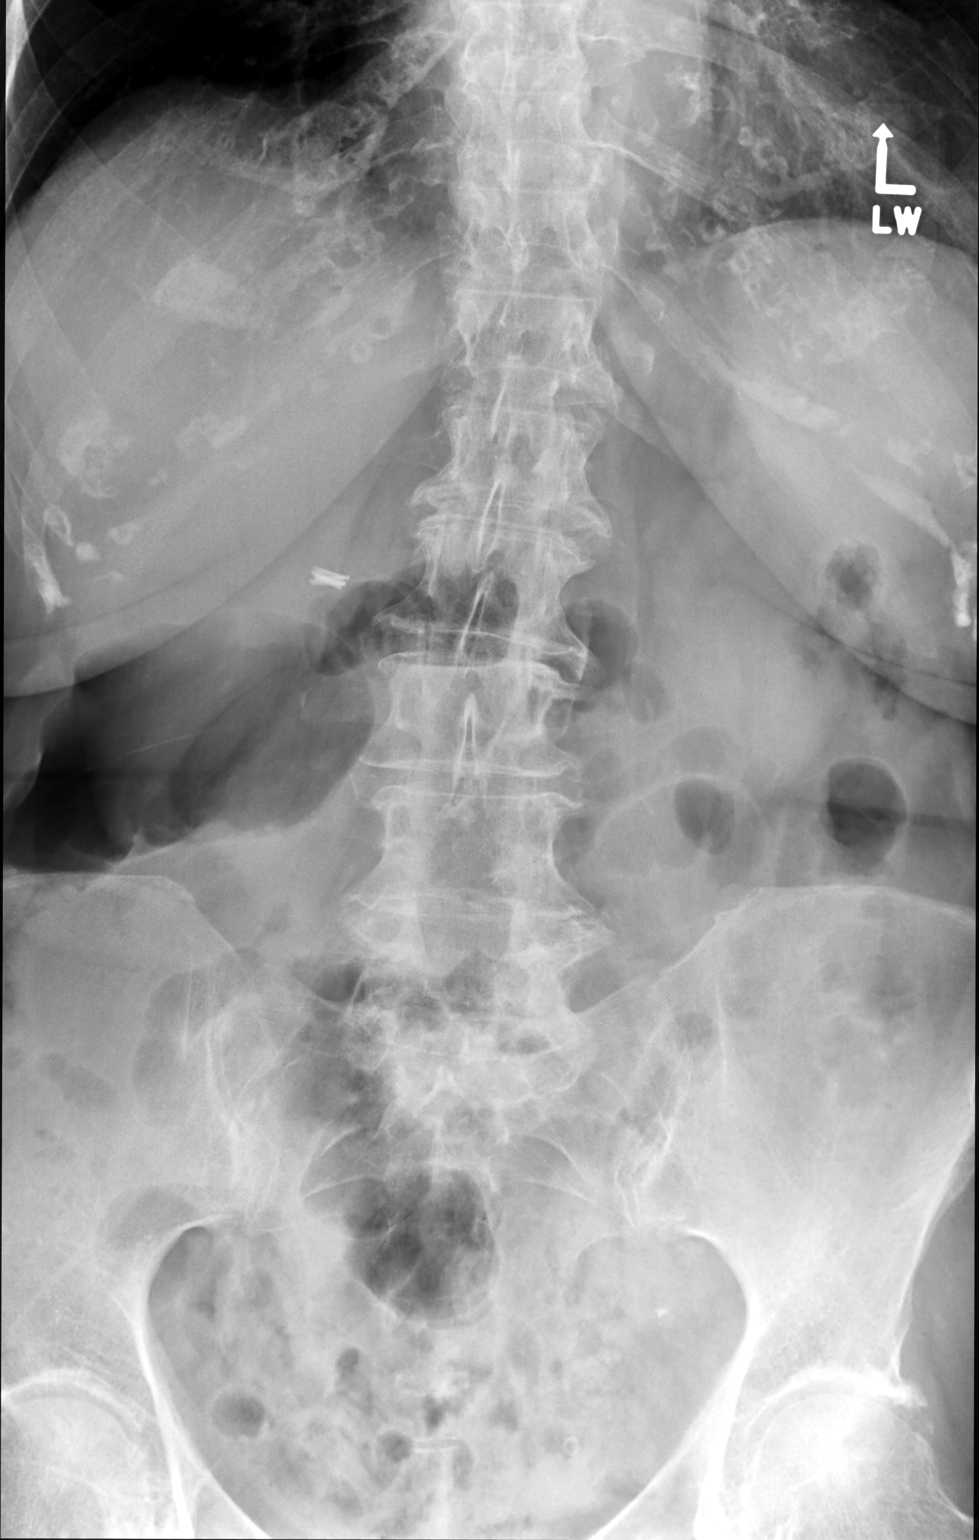

[lumbar spine lat]
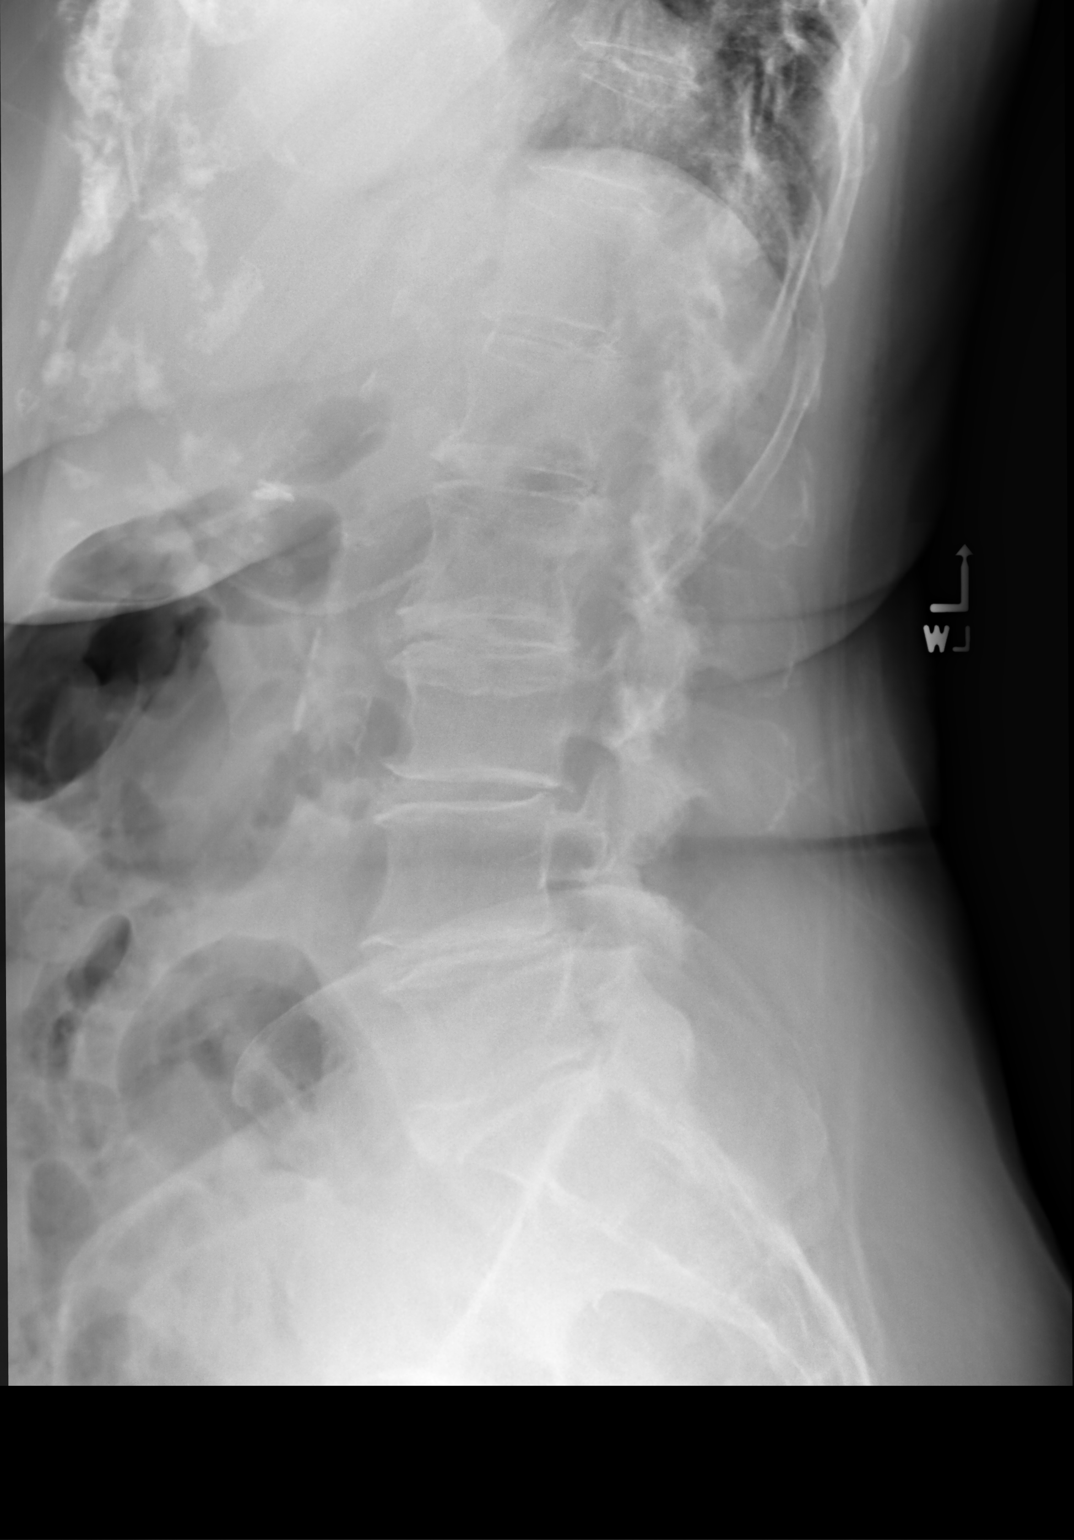

[lumbar spot lat]
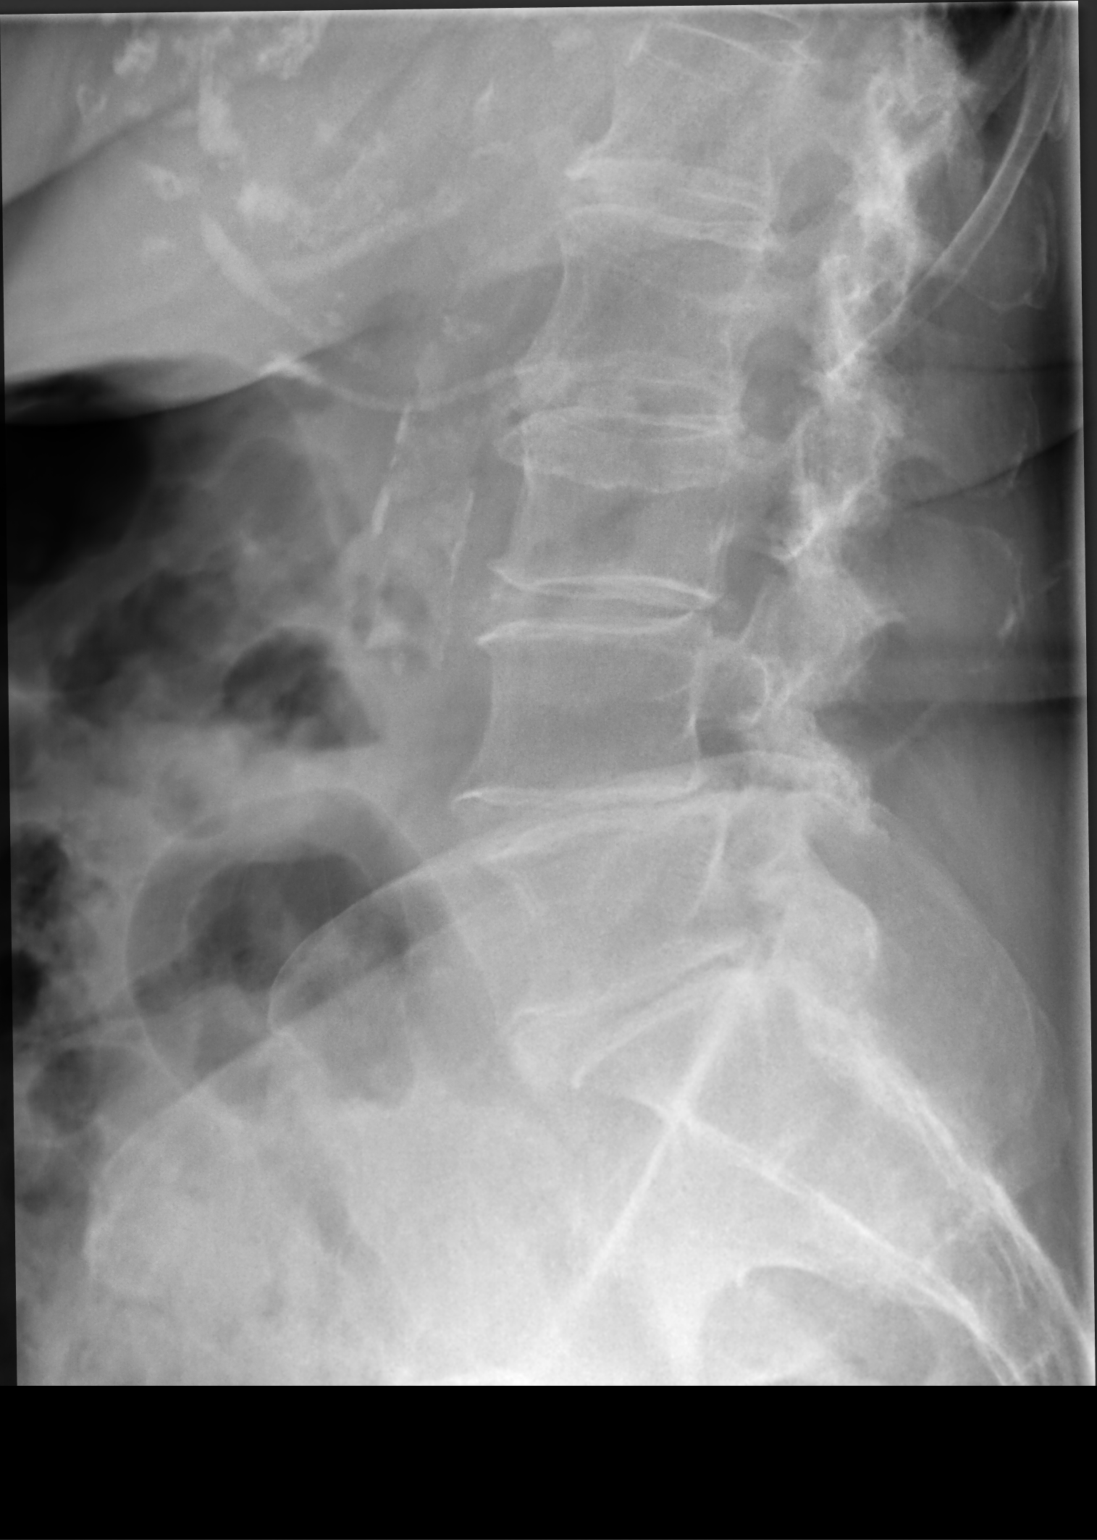

[3 of 3 positions shown; findings below may reference images not displayed]

FINDINGS: Osseous demineralization.

Five non-rib-bearing lumbar vertebra.

Biconvex thoracolumbar scoliosis.

Multilevel disc space narrowing and endplate spur formation.

Facet degenerative changes lower lumbar spine.

Prior laminectomies of L4 and L5.

No fracture, subluxation, or bone destruction.

SI joints preserved.

Atherosclerotic calcifications aorta.
IMPRESSION: Degenerative disc and facet disease changes lumbar spine.

Prior laminectomies of L4 and L5.

No acute abnormalities.

Aortic Atherosclerosis ([id number]).

## 2022-04-24 ENCOUNTER — Ambulatory Visit: Payer: Medicare Other | Admitting: Internal Medicine

## 2022-04-28 ENCOUNTER — Other Ambulatory Visit (INDEPENDENT_AMBULATORY_CARE_PROVIDER_SITE_OTHER): Payer: Medicare Other

## 2022-04-28 DIAGNOSIS — E538 Deficiency of other specified B group vitamins: Secondary | ICD-10-CM

## 2022-04-28 DIAGNOSIS — E7800 Pure hypercholesterolemia, unspecified: Secondary | ICD-10-CM

## 2022-04-28 DIAGNOSIS — I10 Essential (primary) hypertension: Secondary | ICD-10-CM

## 2022-04-28 LAB — VITAMIN B12: Vitamin B-12: 1017 pg/mL — ABNORMAL HIGH (ref 211–911)

## 2022-04-28 LAB — HEPATIC FUNCTION PANEL
ALT: 10 U/L (ref 0–35)
AST: 19 U/L (ref 0–37)
Albumin: 4.6 g/dL (ref 3.5–5.2)
Alkaline Phosphatase: 53 U/L (ref 39–117)
Bilirubin, Direct: 0.1 mg/dL (ref 0.0–0.3)
Total Bilirubin: 0.6 mg/dL (ref 0.2–1.2)
Total Protein: 6.8 g/dL (ref 6.0–8.3)

## 2022-04-28 LAB — BASIC METABOLIC PANEL
BUN: 29 mg/dL — ABNORMAL HIGH (ref 6–23)
CO2: 23 mEq/L (ref 19–32)
Calcium: 9.7 mg/dL (ref 8.4–10.5)
Chloride: 103 mEq/L (ref 96–112)
Creatinine, Ser: 1.47 mg/dL — ABNORMAL HIGH (ref 0.40–1.20)
GFR: 33.07 mL/min — ABNORMAL LOW (ref >60.00)
Glucose, Bld: 91 mg/dL (ref 70–99)
Potassium: 5.1 mEq/L (ref 3.5–5.1)
Sodium: 137 mEq/L (ref 135–145)

## 2022-04-28 LAB — LIPID PANEL
Cholesterol: 299 mg/dL — ABNORMAL HIGH (ref 0–200)
HDL: 102.8 mg/dL (ref >39.00)
LDL Cholesterol: 175 mg/dL — ABNORMAL HIGH (ref 0–99)
NonHDL: 196.47
Total CHOL/HDL Ratio: 3
Triglycerides: 109 mg/dL (ref 0.0–149.0)
VLDL: 21.8 mg/dL (ref 0.0–40.0)

## 2022-04-29 ENCOUNTER — Telehealth: Payer: Self-pay

## 2022-04-29 NOTE — Telephone Encounter (Signed)
See result note.  

## 2022-04-29 NOTE — Telephone Encounter (Signed)
LMTCB for lab results.  

## 2022-04-29 NOTE — Telephone Encounter (Signed)
Pt returning call

## 2022-05-01 ENCOUNTER — Encounter: Payer: Self-pay | Admitting: Internal Medicine

## 2022-05-01 ENCOUNTER — Ambulatory Visit (INDEPENDENT_AMBULATORY_CARE_PROVIDER_SITE_OTHER): Payer: Medicare Other | Admitting: Internal Medicine

## 2022-05-01 ENCOUNTER — Telehealth: Payer: Self-pay

## 2022-05-01 ENCOUNTER — Other Ambulatory Visit: Payer: Self-pay

## 2022-05-01 DIAGNOSIS — Z86010 Personal history of colonic polyps: Secondary | ICD-10-CM

## 2022-05-01 DIAGNOSIS — K219 Gastro-esophageal reflux disease without esophagitis: Secondary | ICD-10-CM

## 2022-05-01 DIAGNOSIS — E7800 Pure hypercholesterolemia, unspecified: Secondary | ICD-10-CM

## 2022-05-01 DIAGNOSIS — I712 Thoracic aortic aneurysm, without rupture, unspecified: Secondary | ICD-10-CM

## 2022-05-01 DIAGNOSIS — I4891 Unspecified atrial fibrillation: Secondary | ICD-10-CM

## 2022-05-01 DIAGNOSIS — D692 Other nonthrombocytopenic purpura: Secondary | ICD-10-CM | POA: Diagnosis not present

## 2022-05-01 DIAGNOSIS — N1832 Chronic kidney disease, stage 3b: Secondary | ICD-10-CM

## 2022-05-01 DIAGNOSIS — I10 Essential (primary) hypertension: Secondary | ICD-10-CM | POA: Diagnosis not present

## 2022-05-01 DIAGNOSIS — D649 Anemia, unspecified: Secondary | ICD-10-CM | POA: Diagnosis not present

## 2022-05-01 MED ORDER — ROSUVASTATIN CALCIUM 10 MG PO TABS: 10.0000 mg | ORAL_TABLET | Freq: Every day | ORAL | 1 refills | Status: DC

## 2022-05-01 NOTE — Telephone Encounter (Signed)
LMTCB for  Did she have problems with the cholesterol medication?  Would she be willing to restart.  If so, would like to restart crestor '10mg'$  q day.

## 2022-05-01 NOTE — Telephone Encounter (Signed)
Sent prescription of Crestor 10 mg to [facility].

## 2022-05-01 NOTE — Telephone Encounter (Signed)
-----   Message from Einar Pheasant, MD sent at 04/29/2022 10:33 PM EDT ----- Reviewed note.  Did she have problems with the cholesterol medication?  Would she be willing to restart.  If so, would like to restart crestor '10mg'$  q day.

## 2022-05-01 NOTE — Progress Notes (Signed)
Patient ID: Leah Huber, female   DOB: 1940-12-02, 81 y.o.   MRN: [id number]   Subjective:    Patient ID: Leah Huber, female    DOB: 1941-02-05, 81 y.o.   MRN: [id number]   Patient here for  Chief Complaint  Patient presents with   Follow-up    Would like to discuss her labs   .   HPI Here to follow up regarding her cholesterol and her blood pressure.  Reports she is doing relatively well.  No chest pain or sob reported.  No abdominal pain or bowel issues reported.  Discussed recent labs.  Discussed cholesterol.  Agreeable to restart crestor.    Past Medical History:  Diagnosis Date   Abnormal liver function test 07/23/2019   Anemia    Aortic aneurysm (HCC)    "SMALL"   Arthritis    neck to tailbone   Bilateral carpal tunnel syndrome 12/08/2017   Bronchitis    Chronic cough    @ NIGHT/ SEASONAL ALLERGIES   Clostridium difficile infection 2013-2014   Colon polyp    Concussion    Coronary artery disease    Dr Nehemiah Massed cardiologist   Depression    Diverticulosis    GERD (gastroesophageal reflux disease)    Glaucoma    Gout    Hemorrhoids    History of chicken pox    History of colonic polyps    History of shingles    Hypercholesterolemia    Hypertension    CONTROLLED ON MEDS   Hypovolemia 12/10   acute renal failure   IBS (irritable bowel syndrome)    Neuromuscular disorder (HCC)    occasionally tingling in hands/ NUMBNESS IN RIGHT FOOT s/p back surgery   Osteoarthritis    bilateral knees   Osteopenia    Pneumonia    HX OF   Sleep apnea    very mild   sleep study 8 yrs ago does not use CPAP   Urinary incontinence    Past Surgical History:  Procedure Laterality Date   ABDOMINAL HYSTERECTOMY  1979   ANTERIOR CERVICAL DECOMP/DISCECTOMY FUSION N/A 11/28/2014   Procedure: CERVICAL FIVE-SIX  ANTERIOR CERVICAL DECOMPRESSION/DISCECTOMY FUSION 1 LEVEL;  Surgeon: Newman Pies, MD;  Location: [facility];  Service: Neurosurgery;  Laterality: N/A;  C56  anterior cervical decompression with fusion interbody prosthesis plating and bonegraft   BACK SURGERY     X2/ L4,5 & S1/ Cervical C7?   BLADDER SURGERY  1981   SUSPENSION   BREAST REDUCTION SURGERY     CARDIOVERSION N/A 10/29/2020   Procedure: CARDIOVERSION;  Surgeon: Corey Skains, MD;  Location: [facility];  Service: Cardiovascular;  Laterality: N/A;   CARPAL TUNNEL RELEASE  2007   CATARACT EXTRACTION W/PHACO Right 12/30/2016   Procedure: CATARACT EXTRACTION PHACO AND INTRAOCULAR LENS PLACEMENT (IOC)  Right toric lens;  Surgeon: Leandrew Koyanagi, MD;  Location: [place];  Service: Ophthalmology;  Laterality: Right;  sleep apnea, does not use CPAP   CATARACT EXTRACTION W/PHACO Left 01/20/2017   Procedure: CATARACT EXTRACTION PHACO AND INTRAOCULAR LENS PLACEMENT ([place])  Left Toric;  Surgeon: Leandrew Koyanagi, MD;  Location: [place];  Service: Ophthalmology;  Laterality: Left;  toric lens   CHOLECYSTECTOMY  1996   COLONOSCOPY     COLONOSCOPY WITH PROPOFOL N/A 05/22/2019   Procedure: COLONOSCOPY WITH PROPOFOL;  Surgeon: Toledo, Benay Pike, MD;  Location: [facility];  Service: Gastroenterology;  Laterality: N/A;   FOOT SURGERY  2005   JOINT REPLACEMENT  Bilateral    [place] Bilateral 2001   ROTATOR CUFF REPAIR  2000 & 2004   left and right   SPINAL CORD STIMULATOR IMPLANT  03/1997   SPINAL CORD STIMULATOR REMOVAL  2007   Tendon repair right shoulder     TONSILLECTOMY     TOTAL KNEE ARTHROPLASTY  10/2011 & 09/13   left and right   Family History  Problem Relation Age of Onset   Heart disease Mother    Stroke Mother    Hypertension Mother    Hyperlipidemia Father    Heart disease Father    Heart disease Brother        2 brothers   Alzheimer's disease Brother    Heart disease Sister        pacemaker   Breast cancer Daughter 22   Social History   Socioeconomic History   Marital status: Widowed     Spouse name: Not on file   Number of children: 2   Years of education: Not on file   Highest education level: Not on file  Occupational History   Not on file  Tobacco Use   Smoking status: Former    Packs/day: 1.00    Years: 20.00    Total pack years: 20.00    Types: Cigarettes    Quit date: 09/07/1988    Years since quitting: 33.6   Smokeless tobacco: Never  Vaping Use   Vaping Use: Never used  Substance and Sexual Activity   Alcohol use: Yes    Alcohol/week: 2.0 standard drinks of alcohol    Types: 2 Glasses of wine per week    Comment: occasional   Drug use: No   Sexual activity: Never  Other Topics Concern   Not on file  Social History Narrative   She is widowed, has two daughters. [place]; quit smoking- in 1990s; social alcohol.    Social Determinants of Health   Financial Resource Strain: Low Risk  (08/25/2021)   Overall Financial Resource Strain (CARDIA)    Difficulty of Paying Living Expenses: Not hard at all  Food Insecurity: No Food Insecurity (08/25/2021)   Hunger Vital Sign    Worried About Running Out of Food in the Last Year: Never true    Ran Out of Food in the Last Year: Never true  Transportation Needs: No Transportation Needs (08/25/2021)   PRAPARE - Hydrologist (Medical): No    Lack of Transportation (Non-Medical): No  Physical Activity: Sufficiently Active (08/25/2021)   Exercise Vital Sign    Days of Exercise per Week: 5 days    Minutes of Exercise per Session: 30 min  Stress: No Stress Concern Present (08/25/2021)   [place]    Feeling of Stress : Not at all  Social Connections: Unknown (08/25/2021)   Social Connection and Isolation Panel [NHANES]    Frequency of Communication with Friends and Family: More than three times a week    Frequency of Social Gatherings with Friends and Family: More than three times a week    Attends Religious Services:  More than 4 times per year    Active Member of [employer] or Organizations: Yes    Attends Music therapist: More than 4 times per year    Marital Status: Not on file     Review of Systems  Constitutional:  Negative for appetite change and  unexpected weight change.  HENT:  Negative for congestion and sinus pressure.   Respiratory:  Negative for cough, chest tightness and shortness of breath.   Cardiovascular:  Negative for chest pain, palpitations and leg swelling.  Gastrointestinal:  Negative for abdominal pain, diarrhea, nausea and vomiting.  Genitourinary:  Negative for difficulty urinating and dysuria.  Musculoskeletal:  Negative for joint swelling and myalgias.  Skin:  Negative for color change and rash.  Neurological:  Negative for dizziness, light-headedness and headaches.  Psychiatric/Behavioral:  Negative for agitation and dysphoric mood.        Objective:     BP 112/74 (BP Location: Left Arm, Patient Position: Sitting, Cuff Size: Normal)   Pulse 94   Temp (!) 96.9 F (36.1 C) (Oral)   Resp 16   Ht 5' 3.9" (1.623 m)   Wt 140 lb 6.4 oz (63.7 kg)   LMP 08/15/1978   SpO2 99%   BMI 24.18 kg/m  Wt Readings from Last 3 Encounters:  05/01/22 140 lb 6.4 oz (63.7 kg)  01/22/22 147 lb (66.7 kg)  01/13/22 149 lb 3.2 oz (67.7 kg)    Physical Exam Vitals reviewed.  Constitutional:      General: She is not in acute distress.    Appearance: Normal appearance.  HENT:     Head: Normocephalic and atraumatic.     Right Ear: External ear normal.     Left Ear: External ear normal.  Eyes:     General: No scleral icterus.       Right eye: No discharge.        Left eye: No discharge.     Conjunctiva/sclera: Conjunctivae normal.  Neck:     Thyroid: No thyromegaly.  Cardiovascular:     Rate and Rhythm: Normal rate and regular rhythm.  Pulmonary:     Effort: No respiratory distress.     Breath sounds: Normal breath sounds. No wheezing.  Abdominal:     General:  Bowel sounds are normal.     Palpations: Abdomen is soft.     Tenderness: There is no abdominal tenderness.  Musculoskeletal:        General: No swelling or tenderness.     Cervical back: Neck supple. No tenderness.  Lymphadenopathy:     Cervical: No cervical adenopathy.  Skin:    Findings: No erythema or rash.  Neurological:     Mental Status: She is alert.  Psychiatric:        Mood and Affect: Mood normal.        Behavior: Behavior normal.      Outpatient Encounter Medications as of 05/01/2022  Medication Sig   cyanocobalamin 1000 MCG tablet Take 1,000 mcg by mouth.   DETROL LA 4 MG 24 hr capsule TAKE 1 CAPSULE DAILY   ELIQUIS 2.5 MG TABS tablet Take 2.5 mg by mouth 2 (two) times daily.   latanoprost (XALATAN) 0.005 % ophthalmic solution Place 1 drop into both eyes at bedtime.    Lifitegrast (XIIDRA) 5 % SOLN Place 1 drop into both eyes daily.   losartan (COZAAR) 25 MG tablet Take 1 tablet by mouth daily.   metoprolol succinate (TOPROL-XL) 25 MG 24 hr tablet Take 25 mg by mouth in the morning and at bedtime.   pantoprazole (PROTONIX) 20 MG tablet One tablet qod   [DISCONTINUED] rosuvastatin (CRESTOR) 10 MG tablet Take 1 tablet (10 mg total) by mouth daily.   [DISCONTINUED] rosuvastatin (CRESTOR) 10 MG tablet Take 1 tablet (10 mg total) by mouth  daily.   No facility-administered encounter medications on file as of 05/01/2022.     Lab Results  Component Value Date   WBC 6.5 01/13/2022   HGB 11.3 (L) 01/13/2022   HCT 34.0 (L) 01/13/2022   PLT 258 01/13/2022   GLUCOSE 91 04/28/2022   CHOL 299 (H) 04/28/2022   TRIG 109.0 04/28/2022   HDL 102.80 04/28/2022   LDLDIRECT 145.0 01/06/2021   LDLCALC 175 (H) 04/28/2022   ALT 10 04/28/2022   AST 19 04/28/2022   NA 137 04/28/2022   K 5.1 04/28/2022   CL 103 04/28/2022   CREATININE 1.47 (H) 04/28/2022   BUN 29 (H) 04/28/2022   CO2 23 04/28/2022   TSH 2.92 10/03/2020   INR 1.0 10/16/2013   HGBA1C 5.5 07/18/2019    MM 3D  SCREEN BREAST BILATERAL  Result Date: 11/22/2021 CLINICAL DATA:  Screening. EXAM: DIGITAL SCREENING BILATERAL MAMMOGRAM WITH TOMOSYNTHESIS AND CAD TECHNIQUE: Bilateral screening digital craniocaudal and mediolateral oblique mammograms were obtained. Bilateral screening digital breast tomosynthesis was performed. The images were evaluated with computer-aided detection. COMPARISON:  Previous exam(s). ACR Breast Density Category b: There are scattered areas of fibroglandular density. FINDINGS: There are no findings suspicious for malignancy. IMPRESSION: No mammographic evidence of malignancy. A result letter of this screening mammogram will be mailed directly to the patient. RECOMMENDATION: Screening mammogram in one year. (Code:SM-B-01Y) BI-RADS CATEGORY  1: Negative. Electronically Signed   By: Ammie Ferrier M.D.   On: 11/22/2021 09:44       Assessment & Plan:   Problem List Items Addressed This Visit     A-fib ([place])    On metoprolol.  On eliquis - dose just adjusted.  S/P ablation in September. Overall appears to be stable.         Anemia    Being followed by Dr Rogue Bussing.  IV venofer prn.  Follow cbc.       Aortic aneurysm [facility])    Has been followed by cardiology.       CKD (chronic kidney disease) stage 3, GFR 30-59 ml/min (HCC)    Avoid nephrotoxins.  On losartan. Follow pressures.  Follow metabolic panel.  Recent checked decreased - when compared to previous checks.  Followed by nephrology.        GERD (gastroesophageal reflux disease)    On protonix '20mg'$  qod now.  Symptoms controlled.  Follow.       History of colonic polyps    Colonoscopy September 2020-1 tubular adenomatous polyp (hepatic flexure), diverticulosis and internal hemorrhoids.      Hypercholesterolemia    On crestor.  Low cholesterol diet and exercise.  Follow lipid panel and liver function tests.        Relevant Orders   Hepatic function panel   Lipid panel   TSH   Hypertension    Continue on  losartan and metoprolol.  Blood pressure doing well.  Same medication regimen.  Follow.        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  On eliquis.         Einar Pheasant, MD

## 2022-05-01 NOTE — Telephone Encounter (Signed)
Patient states she is fine with taking the crestor, '10mg'$  per day.  *Patient states her preferred pharmacy is [facility]

## 2022-05-04 ENCOUNTER — Encounter: Payer: Self-pay | Admitting: *Deleted

## 2022-05-11 ENCOUNTER — Encounter: Payer: Self-pay | Admitting: Internal Medicine

## 2022-05-11 NOTE — Assessment & Plan Note (Signed)
On protonix '20mg'$  qod now.  Symptoms controlled.  Follow.

## 2022-05-11 NOTE — Assessment & Plan Note (Addendum)
Avoid nephrotoxins.  On losartan. Follow pressures.  Follow metabolic panel.  Recent checked decreased - when compared to previous checks.  Followed by nephrology.

## 2022-05-11 NOTE — Assessment & Plan Note (Signed)
On metoprolol.  On eliquis - dose just adjusted.  S/P ablation in September. Overall appears to be stable.

## 2022-05-11 NOTE — Assessment & Plan Note (Signed)
Continue on losartan and metoprolol.  Blood pressure doing well.  Same medication regimen.  Follow.   

## 2022-05-11 NOTE — Assessment & Plan Note (Signed)
Colonoscopy September 2020-1 tubular adenomatous polyp (hepatic flexure), diverticulosis and internal hemorrhoids. 

## 2022-05-11 NOTE — Assessment & Plan Note (Signed)
Has been followed by cardiology.  

## 2022-05-11 NOTE — Assessment & Plan Note (Signed)
History of afib.  On eliquis.   

## 2022-05-11 NOTE — Assessment & Plan Note (Signed)
On crestor.  Low cholesterol diet and exercise.  Follow lipid panel and liver function tests.   

## 2022-05-11 NOTE — Assessment & Plan Note (Signed)
Being followed by Dr Brahmanday.  IV venofer prn.  Follow [facility].  

## 2022-05-14 MED FILL — Iron Sucrose Inj 20 MG/ML (Fe Equiv): INTRAVENOUS | Qty: 10 | Status: AC

## 2022-05-15 ENCOUNTER — Inpatient Hospital Stay: Payer: Medicare Other

## 2022-05-15 ENCOUNTER — Inpatient Hospital Stay: Payer: Medicare Other | Admitting: Internal Medicine

## 2022-05-27 ENCOUNTER — Other Ambulatory Visit: Payer: Medicare Other

## 2022-05-27 ENCOUNTER — Ambulatory Visit: Payer: Medicare Other

## 2022-05-27 ENCOUNTER — Ambulatory Visit: Payer: Medicare Other | Admitting: Internal Medicine

## 2022-06-05 MED FILL — Iron Sucrose Inj 20 MG/ML (Fe Equiv): INTRAVENOUS | Qty: 10 | Status: AC

## 2022-06-08 ENCOUNTER — Encounter: Payer: Self-pay | Admitting: Medical Oncology

## 2022-06-08 ENCOUNTER — Inpatient Hospital Stay: Payer: Medicare Other | Attending: Internal Medicine

## 2022-06-08 ENCOUNTER — Inpatient Hospital Stay (HOSPITAL_BASED_OUTPATIENT_CLINIC_OR_DEPARTMENT_OTHER): Payer: Medicare Other | Admitting: Medical Oncology

## 2022-06-08 ENCOUNTER — Inpatient Hospital Stay: Payer: Medicare Other

## 2022-06-08 VITALS — BP 119/71 | HR 68 | Temp 97.6°F | Ht 63.9 in | Wt 142.0 lb

## 2022-06-08 DIAGNOSIS — D539 Nutritional anemia, unspecified: Secondary | ICD-10-CM | POA: Diagnosis not present

## 2022-06-08 DIAGNOSIS — D509 Iron deficiency anemia, unspecified: Secondary | ICD-10-CM | POA: Diagnosis not present

## 2022-06-08 DIAGNOSIS — E611 Iron deficiency: Secondary | ICD-10-CM

## 2022-06-08 DIAGNOSIS — N183 Chronic kidney disease, stage 3 unspecified: Secondary | ICD-10-CM | POA: Insufficient documentation

## 2022-06-08 DIAGNOSIS — Z79899 Other long term (current) drug therapy: Secondary | ICD-10-CM | POA: Insufficient documentation

## 2022-06-08 LAB — CBC WITH DIFFERENTIAL/PLATELET
Abs Immature Granulocytes: 0.03 10*3/uL (ref 0.00–0.07)
Basophils Absolute: 0.1 10*3/uL (ref 0.0–0.1)
Basophils Relative: 1 %
Eosinophils Absolute: 0.1 10*3/uL (ref 0.0–0.5)
Eosinophils Relative: 2 %
HCT: 34.3 % — ABNORMAL LOW (ref 36.0–46.0)
Hemoglobin: 11.4 g/dL — ABNORMAL LOW (ref 12.0–15.0)
Immature Granulocytes: 1 %
Lymphocytes Relative: 36 %
Lymphs Abs: 2 10*3/uL (ref 0.7–4.0)
MCH: 34.9 pg — ABNORMAL HIGH (ref 26.0–34.0)
MCHC: 33.2 g/dL (ref 30.0–36.0)
MCV: 104.9 fL — ABNORMAL HIGH (ref 80.0–100.0)
Monocytes Absolute: 0.5 10*3/uL (ref 0.1–1.0)
Monocytes Relative: 9 %
Neutro Abs: 2.9 10*3/uL (ref 1.7–7.7)
Neutrophils Relative %: 51 %
Platelets: 258 10*3/uL (ref 150–400)
RBC: 3.27 MIL/uL — ABNORMAL LOW (ref 3.87–5.11)
RDW: 13.3 % (ref 11.5–15.5)
WBC: 5.6 10*3/uL (ref 4.0–10.5)
nRBC: 0 % (ref 0.0–0.2)

## 2022-06-08 LAB — BASIC METABOLIC PANEL
Anion gap: 5 (ref 5–15)
BUN: 29 mg/dL — ABNORMAL HIGH (ref 8–23)
CO2: 25 mmol/L (ref 22–32)
Calcium: 9.5 mg/dL (ref 8.9–10.3)
Chloride: 109 mmol/L (ref 98–111)
Creatinine, Ser: 1.39 mg/dL — ABNORMAL HIGH (ref 0.44–1.00)
GFR, Estimated: 38 mL/min — ABNORMAL LOW (ref >60)
Glucose, Bld: 105 mg/dL — ABNORMAL HIGH (ref 70–99)
Potassium: 4.8 mmol/L (ref 3.5–5.1)
Sodium: 139 mmol/L (ref 135–145)

## 2022-06-08 LAB — IRON AND TIBC
Iron: 90 ug/dL (ref 28–170)
Saturation Ratios: 23 % (ref 10.4–31.8)
TIBC: 391 ug/dL (ref 250–450)
UIBC: 301 ug/dL

## 2022-06-08 LAB — FERRITIN: Ferritin: 66 ng/mL (ref 11–307)

## 2022-06-08 LAB — VITAMIN B12: Vitamin B-12: 1458 pg/mL — ABNORMAL HIGH (ref 180–914)

## 2022-06-08 NOTE — Progress Notes (Signed)
[facility] NOTE  Patient Care Team: Einar Pheasant, MD as PCP - General (Internal Medicine) Efrain Sella, MD as Consulting Physician (Gastroenterology) Cammie Sickle, MD as Consulting Physician (Internal Medicine)  CHIEF COMPLAINTS/PURPOSE OF CONSULTATION: ANEMIA  HEMATOLOGY HISTORY:  # AUG 2022:  SEVERE ANEMIA [hb-7; ]  EGD/Colonoscopy:2020- last colo-36m [[facility] Dr.Toledo]; Esophagogram- 2022- NEG for stricture. ? capsule  # CKD stage III [Dr.Korrapti]; A.fib [on eliquis on 2.5 mg BID;  Dr.Kowalski]    HISTORY OF PRESENTING ILLNESS: Alone. Ambulating independently.  NErie [id number].  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   In the past she trailed gentle iron but had significant constipation secondary even to gentle iron so she was switched to IV Venofer. Her last infusion was on 01/13/2022. She did well with this treatment. She reports for follow up for her anemia today. Today she reports that chronic fatigue is stable- she believes this is secondary to her A.Fib which is managed by cardiology. No known bleeding episodes on her Eliquis.    Review of Systems  Constitutional:  Positive for malaise/fatigue. Negative for chills, diaphoresis, fever and weight loss.  HENT:  Negative for nosebleeds and sore throat.   Eyes:  Negative for double vision.  Respiratory:  Negative for cough, hemoptysis, sputum production and wheezing.   Cardiovascular:  Negative for chest pain, palpitations, orthopnea and leg swelling.  Gastrointestinal:  Negative for blood in stool, constipation, heartburn, melena and vomiting.  Genitourinary:  Negative for dysuria, frequency and urgency.  Musculoskeletal:  Positive for back pain and joint pain.  Skin: Negative.  Negative for itching and rash.  Neurological:  Negative for tingling, focal weakness, weakness and headaches.  Endo/Heme/Allergies:  Does not bruise/bleed easily.   Psychiatric/Behavioral:  Negative for depression. The patient is not nervous/anxious and does not have insomnia.     MEDICAL HISTORY:  Past Medical History:  Diagnosis Date   Abnormal liver function test 07/23/2019   Anemia    Aortic aneurysm (HCC)    "SMALL"   Arthritis    neck to tailbone   Bilateral carpal tunnel syndrome 12/08/2017   Bronchitis    Chronic cough    @ NIGHT/ SEASONAL ALLERGIES   Clostridium difficile infection 2013-2014   Colon polyp    Concussion    Coronary artery disease    Dr KNehemiah Massedcardiologist   Depression    Diverticulosis    GERD (gastroesophageal reflux disease)    Glaucoma    Gout    Hemorrhoids    History of chicken pox    History of colonic polyps    History of shingles    Hypercholesterolemia    Hypertension    CONTROLLED ON MEDS   Hypovolemia 12/10   acute renal failure   IBS (irritable bowel syndrome)    Neuromuscular disorder (HCC)    occasionally tingling in hands/ NUMBNESS IN RIGHT FOOT s/p back surgery   Osteoarthritis    bilateral knees   Osteopenia    Pneumonia    HX OF   Sleep apnea    very mild   sleep study 8 yrs ago does not use CPAP   Urinary incontinence     SURGICAL HISTORY: Past Surgical History:  Procedure Laterality Date   ABDOMINAL HYSTERECTOMY  1979   ANTERIOR CERVICAL DECOMP/DISCECTOMY FUSION N/A 11/28/2014   Procedure: CERVICAL FIVE-SIX  ANTERIOR CERVICAL DECOMPRESSION/DISCECTOMY FUSION 1 LEVEL;  Surgeon: JNewman Pies MD;  Location: [facility];  Service: Neurosurgery;  Laterality: N/A;  C56 anterior cervical decompression with fusion interbody prosthesis plating and bonegraft   BACK SURGERY     X2/ L4,5 & S1/ Cervical C7?   BLADDER SURGERY  1981   SUSPENSION   BREAST REDUCTION SURGERY     CARDIOVERSION N/A 10/29/2020   Procedure: CARDIOVERSION;  Surgeon: Corey Skains, MD;  Location: [facility];  Service: Cardiovascular;  Laterality: N/A;   CARPAL TUNNEL RELEASE  2007   CATARACT EXTRACTION  W/PHACO Right 12/30/2016   Procedure: CATARACT EXTRACTION PHACO AND INTRAOCULAR LENS PLACEMENT ([facility])  Right toric lens;  Surgeon: Leandrew Koyanagi, MD;  Location: [place];  Service: Ophthalmology;  Laterality: Right;  sleep apnea, does not use CPAP   CATARACT EXTRACTION W/PHACO Left 01/20/2017   Procedure: CATARACT EXTRACTION PHACO AND INTRAOCULAR LENS PLACEMENT ([place])  Left Toric;  Surgeon: Leandrew Koyanagi, MD;  Location: [place];  Service: Ophthalmology;  Laterality: Left;  toric lens   CHOLECYSTECTOMY  1996   COLONOSCOPY     COLONOSCOPY WITH PROPOFOL N/A 05/22/2019   Procedure: COLONOSCOPY WITH PROPOFOL;  Surgeon: Toledo, Benay Pike, MD;  Location: [facility];  Service: Gastroenterology;  Laterality: N/A;   FOOT SURGERY  2005   JOINT REPLACEMENT Bilateral    KNEE   [place] Bilateral 2001   ROTATOR CUFF REPAIR  2000 & 2004   left and right   SPINAL CORD STIMULATOR IMPLANT  03/1997   SPINAL CORD STIMULATOR REMOVAL  2007   Tendon repair right shoulder     TONSILLECTOMY     TOTAL KNEE ARTHROPLASTY  10/2011 & 09/13   left and right    SOCIAL HISTORY: Social History   Socioeconomic History   Marital status: Widowed    Spouse name: Not on file   Number of children: 2   Years of education: Not on file   Highest education level: Not on file  Occupational History   Not on file  Tobacco Use   Smoking status: Former    Packs/day: 1.00    Years: 20.00    Total pack years: 20.00    Types: Cigarettes    Quit date: 09/07/1988    Years since quitting: 33.7   Smokeless tobacco: Never  Vaping Use   Vaping Use: Never used  Substance and Sexual Activity   Alcohol use: Yes    Alcohol/week: 2.0 standard drinks of alcohol    Types: 2 Glasses of wine per week    Comment: occasional   Drug use: No   Sexual activity: Never  Other Topics Concern   Not on file  Social History Narrative   She is widowed, has two  daughters. [place]; quit smoking- in 1990s; social alcohol.    Social Determinants of Health   Financial Resource Strain: Low Risk  (08/25/2021)   Overall Financial Resource Strain (CARDIA)    Difficulty of Paying Living Expenses: Not hard at all  Food Insecurity: No Food Insecurity (08/25/2021)   Hunger Vital Sign    Worried About Running Out of Food in the Last Year: Never true    Ran Out of Food in the Last Year: Never true  Transportation Needs: No Transportation Needs (08/25/2021)   PRAPARE - Hydrologist (Medical): No    Lack of Transportation (Non-Medical): No  Physical Activity: Sufficiently Active (08/25/2021)   Exercise Vital Sign    Days of Exercise per Week: 5 days  Minutes of Exercise per Session: 30 min  Stress: No Stress Concern Present (08/25/2021)   [place]    Feeling of Stress : Not at all  Social Connections: Unknown (08/25/2021)   Social Connection and Isolation Panel [NHANES]    Frequency of Communication with Friends and Family: More than three times a week    Frequency of Social Gatherings with Friends and Family: More than three times a week    Attends Religious Services: More than 4 times per year    Active Member of [employer] or Organizations: Yes    Attends Music therapist: More than 4 times per year    Marital Status: Not on file  Intimate Partner Violence: Not At Risk (08/25/2021)   Humiliation, Afraid, Rape, and Kick questionnaire    Fear of Current or Ex-Partner: No    Emotionally Abused: No    Physically Abused: No    Sexually Abused: No    FAMILY HISTORY: Family History  Problem Relation Age of Onset   Heart disease Mother    Stroke Mother    Hypertension Mother    Hyperlipidemia Father    Heart disease Father    Heart disease Brother        2 brothers   Alzheimer's disease Brother    Heart disease Sister        pacemaker    Breast cancer Daughter 18    ALLERGIES:  is allergic to clindamycin/lincomycin, dronedarone, bactrim [sulfamethoxazole-trimethoprim], morphine, morphine and related, and sulfa antibiotics.  MEDICATIONS:  Current Outpatient Medications  Medication Sig Dispense Refill   cyanocobalamin 1000 MCG tablet Take 1,000 mcg by mouth.     DETROL LA 4 MG 24 hr capsule TAKE 1 CAPSULE DAILY 90 capsule 3   ELIQUIS 2.5 MG TABS tablet Take 2.5 mg by mouth 2 (two) times daily.     latanoprost (XALATAN) 0.005 % ophthalmic solution Place 1 drop into both eyes at bedtime.      Lifitegrast (XIIDRA) 5 % SOLN Place 1 drop into both eyes daily.     losartan (COZAAR) 25 MG tablet Take 1 tablet by mouth daily.     metoprolol succinate (TOPROL-XL) 25 MG 24 hr tablet Take 25 mg by mouth in the morning and at bedtime.     pantoprazole (PROTONIX) 20 MG tablet One tablet qod 30 tablet 0   rosuvastatin (CRESTOR) 10 MG tablet Take 1 tablet (10 mg total) by mouth daily. 90 tablet 1   No current facility-administered medications for this visit.      PHYSICAL EXAMINATION:   Vitals:   06/08/22 1423  BP: 119/71  Pulse: 68  Temp: 97.6 F (36.4 C)   Filed Weights   06/08/22 1423  Weight: 142 lb (64.4 kg)    Physical Exam Vitals and nursing note reviewed.  HENT:     Head: Normocephalic and atraumatic.     Mouth/Throat:     Pharynx: Oropharynx is clear.  Eyes:     Extraocular Movements: Extraocular movements intact.     Pupils: Pupils are equal, round, and reactive to light.  Cardiovascular:     Rate and Rhythm: Normal rate and regular rhythm.  Abdominal:     Palpations: Abdomen is soft.  Musculoskeletal:        General: Normal range of motion.     Cervical back: Normal range of motion.  Skin:    General: Skin is warm.     Coloration: Skin is  not pale.     Findings: No rash.  Neurological:     General: No focal deficit present.     Mental Status: She is alert and oriented to person, place, and time.   Psychiatric:        Behavior: Behavior normal.        Judgment: Judgment normal.    LABORATORY DATA:  I have reviewed the data as listed Lab Results  Component Value Date   WBC 5.6 06/08/2022   HGB 11.4 (L) 06/08/2022   HCT 34.3 (L) 06/08/2022   MCV 104.9 (H) 06/08/2022   PLT 258 06/08/2022   Recent Labs    08/11/21 0943 09/16/21 1245 01/13/22 1246 04/28/22 0924 06/08/22 1406  NA 139 136 135 137 139  K 4.5 4.1 4.2 5.1 4.8  CL 102 101 104 103 109  CO2 '28 25 23 23 25  '$ GLUCOSE 81 87 92 91 105*  BUN 16 25* 23 29* 29*  CREATININE 1.19 1.14* 1.18* 1.47* 1.39*  CALCIUM 9.7 9.0 9.1 9.7 9.5  GFRNONAA  --  48* 46*  --  38*  PROT 6.6  --   --  6.8  --   ALBUMIN 4.4  --   --  4.6  --   AST 18  --   --  19  --   ALT 14  --   --  10  --   ALKPHOS 50  --   --  53  --   BILITOT 0.5  --   --  0.6  --   BILIDIR 0.1  --   --  0.1  --      No results found.  Encounter Diagnoses  Name Primary?   Iron deficiency    Macrocytic anemia Yes    Anemia: Chronic in nature. Currently on PRN Venofer treatments. Hgb is 11.4 today with her last reading being 11.3. Her MV is slightly elevated at 104.9 - B12 pending however her last B12 level on 04/28/2022 by outside specialist was 1,017. Iron/TIBC/Ferritin levels pending at time of visit. Last ferritin was 39 back in May when she had her last Venofer treatment (Hgb at that time was 11.3). Discussed completing 1 dose of IV Venofer today vs waiting for her iron studies to result. She elects to return once labs have resulted. We will keep her plan of RTC 4 months with labs. If her ferritin is <20 I would recommend 5 weekly treatments of Venofer. If her ferritin is <30 I would recommend 4 weekly treatments of Venofer. If her ferritin is 30-40 I would recommend 3 weekly treatments of IV Venofer. If her ferritin level is 40-50 I would recommend 1-2 dose of Iv Iron.   Disposition: No IV Venofer today RTC for IV venofer will depend on ferritin level as  stated above RTC 4 months labs D1(CBC, iron/TIBC, ferritin, folate-given macrocytosis), MD +_ Venofer D2   All questions were answered. The patient knows to call the clinic with any problems, questions or concerns.      Hughie Closs, PA-C 06/08/2022 3:02 PM

## 2022-06-15 DIAGNOSIS — H401131 Primary open-angle glaucoma, bilateral, mild stage: Secondary | ICD-10-CM | POA: Diagnosis not present

## 2022-06-16 ENCOUNTER — Encounter: Payer: Self-pay | Admitting: Dermatology

## 2022-06-16 ENCOUNTER — Ambulatory Visit (INDEPENDENT_AMBULATORY_CARE_PROVIDER_SITE_OTHER): Payer: Medicare Other | Admitting: Dermatology

## 2022-06-16 DIAGNOSIS — L309 Dermatitis, unspecified: Secondary | ICD-10-CM

## 2022-06-16 DIAGNOSIS — Z79899 Other long term (current) drug therapy: Secondary | ICD-10-CM

## 2022-06-16 DIAGNOSIS — L209 Atopic dermatitis, unspecified: Secondary | ICD-10-CM

## 2022-06-16 MED ORDER — CLOBETASOL PROPIONATE 0.05 % EX SOLN: CUTANEOUS | 2 refills | Status: DC

## 2022-06-16 MED ORDER — DUPIXENT 300 MG/2ML ~~LOC~~ SOSY: 300.0000 mg | PREFILLED_SYRINGE | SUBCUTANEOUS | 5 refills | Status: DC

## 2022-06-16 NOTE — Progress Notes (Signed)
Follow-Up Visit   Subjective  Leah Huber is a 81 y.o. female who presents for the following: Rash (Since getting COVID vaccine. Worse. Has been on Dupixent in the past. Improved and stopped Dupixent. Back, arms. Itching constantly. Dr. Nehemiah Massed saw her Sunday and gave loading dose of Dupixent. She had 2 syringes at home that had not expired. Needs new Rx for Dupixent).  The following portions of the chart were reviewed this encounter and updated as appropriate:  Tobacco  Allergies  Meds  Problems  Med Hx  Surg Hx  Fam Hx      Review of Systems: No other skin or systemic complaints except as noted in HPI or Assessment and Plan.   Objective  Well appearing patient in no apparent distress; mood and affect are within normal limits.  A focused examination was performed including arms, back. Relevant physical exam findings are noted in the Assessment and Plan.  Left Forearm - Anterior Scattered erythematous, excoriated papules in various stages   Assessment & Plan  Atopic dermatitis, unspecified type Left Forearm - Anterior  Chronic and persistent condition with duration or expected duration over one year. Condition is bothersome/symptomatic for patient. Currently flared.  Atopic dermatitis (eczema) is a chronic, relapsing, pruritic condition that can significantly affect quality of life. It is often associated with allergic rhinitis and/or asthma and can require treatment with topical medications, phototherapy, or in severe cases biologic injectable medication (Dupixent; Adbry) or Oral JAK inhibitors.  Continue Dupixent '300mg'$  1 syring subcutaneously every 2 weeks.  Start Triamcinolone 0.1% ointment once daily. Avoid applying to face, groin, and axilla. Use as directed. Long-term use can cause thinning of the skin.  Eczema Skin Care  Buy TWO 16oz jars of CeraVe moisturizing cream  [facility], [facility], [facility] (no prescription needed)  Costs about $15 per jar   Jar #1: Use  as a moisturizer as needed. Can be applied to any area of the body. Use twice daily to unaffected areas.  Jar #2: Pour one 69m bottle of clobetasol 0.05% solution into jar, mix well. Label this jar to indicate the medication has been added. Use twice daily to affected areas. Do not apply to face, groin or underarms.  Moisturizer may burn or sting initially. Try for at least 4 weeks.     Dupilumab (Dupixent) is a treatment given by injection for adults and children with moderate-to-severe atopic dermatitis. Goal is control of skin condition, not cure. It is given as 2 injections at the first dose followed by 1 injection ever 2 weeks thereafter.  Young children are dosed monthly.  Potential side effects include allergic reaction, herpes infections, injection site reactions and conjunctivitis (inflammation of the eyes).  The use of Dupixent requires long term medication management, including periodic office visits.  Topical steroids (such as triamcinolone, fluocinolone, fluocinonide, mometasone, clobetasol, halobetasol, betamethasone, hydrocortisone) can cause thinning and lightening of the skin if they are used for too long in the same area. Your physician has selected the right strength medicine for your problem and area affected on the body. Please use your medication only as directed by your physician to prevent side effects.    dupilumab (DUPIXENT) 300 MG/2ML prefilled syringe - Left Forearm - Anterior Inject 300 mg into the skin every 14 (fourteen) days. Starting at day 15 for maintenance.  clobetasol (TEMOVATE) 0.05 % external solution - Left Forearm - Anterior Apply twice daily to affected areas at least up to 4 weeks.   Return for Injection On Nurse  Schedule Dupixent 06/29/2022, Atopic Dermatitis in 3-4 months.  I, Emelia Salisbury, CMA, am acting as scribe for Forest Gleason, MD.  Documentation: I have reviewed the above documentation for accuracy and completeness, and I agree with the  above.  Forest Gleason, MD

## 2022-06-16 NOTE — Patient Instructions (Addendum)
Recommend taking Zyrtec '10mg'$  once daily. Can cause drowsiness  Continue Dupixent '300mg'$  1 syring subcutaneously every 2 weeks.  Start Triamcinolone 0.1% ointment once daily. Avoid applying to face, groin, and axilla. Use as directed. Long-term use can cause thinning of the skin.  Eczema Skin Care  Buy TWO 16oz jars of CeraVe moisturizing cream  [facility], [facility], [facility] (no prescription needed)  Costs about $15 per jar   Jar #1: Use as a moisturizer as needed. Can be applied to any area of the body. Use twice daily to unaffected areas.  Jar #2: Pour one 36m bottle of clobetasol 0.05% solution into jar, mix well. Label this jar to indicate the medication has been added. Use twice daily to affected areas. Do not apply to face, groin or underarms.  Moisturizer may burn or sting initially. Try for at least 4 weeks.    Dupilumab (Dupixent) is a treatment given by injection for adults and children with moderate-to-severe atopic dermatitis. Goal is control of skin condition, not cure. It is given as 2 injections at the first dose followed by 1 injection ever 2 weeks thereafter.  Young children are dosed monthly.  Potential side effects include allergic reaction, herpes infections, injection site reactions and conjunctivitis (inflammation of the eyes).  The use of Dupixent requires long term medication management, including periodic office visits.  Topical steroids (such as triamcinolone, fluocinolone, fluocinonide, mometasone, clobetasol, halobetasol, betamethasone, hydrocortisone) can cause thinning and lightening of the skin if they are used for too long in the same area. Your physician has selected the right strength medicine for your problem and area affected on the body. Please use your medication only as directed by your physician to prevent side effects.    Gentle Skin Care Guide  1. Bathe no more than once a day.  2. Avoid bathing in hot water  3. Use a mild soap like Dove, Vanicream,  Cetaphil, CeraVe. Can use Lever 2000 or Cetaphil antibacterial soap  4. Use soap only where you need it. On most days, use it under your arms, between your legs, and on your feet. Let the water rinse other areas unless visibly dirty.  5. When you get out of the bath/shower, use a towel to gently blot your skin dry, don't rub it.  6. While your skin is still a little damp, apply a moisturizing cream such as Vanicream, CeraVe, Cetaphil, Eucerin, Sarna lotion or plain Vaseline Jelly. For hands apply Neutrogena [place]Hand Cream or Excipial Hand Cream.  7. Reapply moisturizer any time you start to itch or feel dry.  8. Sometimes using free and clear laundry detergents can be helpful. Fabric softener sheets should be avoided. Downy Free & Gentle liquid, or any liquid fabric softener that is free of dyes and perfumes, it acceptable to use  9. If your doctor has given you prescription creams you may apply moisturizers over them      Due to recent changes in healthcare laws, you may see results of your pathology and/or laboratory studies on MyChart before the doctors have had a chance to review them. We understand that in some cases there may be results that are confusing or concerning to you. Please understand that not all results are received at the same time and often the doctors may need to interpret multiple results in order to provide you with the best plan of care or course of treatment. Therefore, we ask that you please give u[place]2 business days to thoroughly review all your results  before contacting the office for clarification. Should we see a critical lab result, you will be contacted sooner.   If You Need Anything After Your Visit  If you have any questions or concerns for your doctor, please call our main line at [phone] and press option 4 to reach your doctor's medical assistant. If no one answers, please leave a voicemail as directed and we will return your call as soon as  possible. Messages left after 4 pm will be answered the following business day.   You may also send [place] a message via [place]. We typically respond to MyChart messages within 1-2 business days.  For prescription refills, please ask your pharmacy to contact our office. Our fax number is [phone].  If you have an urgent issue when the clinic is closed that cannot wait until the next business day, you can page your doctor at the number below.    Please note that while we do our best to be available for urgent issues outside of office hours, we are not available 24/7.   If you have an urgent issue and are unable to reach [place], you may choose to seek medical care at your doctor's office, retail clinic, urgent care center, or emergency room.  If you have a medical emergency, please immediately call 911 or go to the emergency department.  Pager Numbers  - Dr. Nehemiah Massed: [phone]  - Dr. Laurence Ferrari: [phone]  - Dr. Nicole Kindred: [phone]  In the event of inclement weather, please call our main line at [phone] for an update on the status of any delays or closures.  Dermatology Medication Tips: Please keep the boxes that topical medications come in in order to help keep track of the instructions about where and how to use these. Pharmacies typically print the medication instructions only on the boxes and not directly on the medication tubes.   If your medication is too expensive, please contact our office at [phone] option 4 or send [place] a message through [place].   We are unable to tell what your co-pay for medications will be in advance as this is different depending on your insurance coverage. However, we may be able to find a substitute medication at lower cost or fill out paperwork to get insurance to cover a needed medication.   If a prior authorization is required to get your medication covered by your insurance company, please allow [place] 1-2 business days to complete this  process.  Drug prices often vary depending on where the prescription is filled and some pharmacies may offer cheaper prices.  The website www.goodrx.com contains coupons for medications through different pharmacies. The prices here do not account for what the cost may be with help from insurance (it may be cheaper with your insurance), but the website can give you the price if you did not use any insurance.  - You can print the associated coupon and take it with your prescription to the pharmacy.  - You may also stop by our office during regular business hours and pick up a GoodRx coupon card.  - If you need your prescription sent electronically to a different pharmacy, notify our office through [facility] or by phone at [phone] option 4.     Si Usted Necesita Algo Despus de Su Visita  Tambin puede enviarnos un mensaje a travs de Pharmacist, community. Por lo general respondemos a los mensajes de MyChart en el transcurso de 1 a 2 das hbiles.  Para renovar recetas, por favor pida  a su farmacia que se ponga en contacto con nuestra oficina. Harland Dingwall de fax es [place] [phone].  Si tiene un asunto urgente cuando la clnica est cerrada y que no puede esperar hasta el siguiente da hbil, puede llamar/localizar a su doctor(a) al nmero que aparece a continuacin.   Por favor, tenga en cuenta que aunque hacemos todo lo posible para estar disponibles para asuntos urgentes fuera del horario de [place], no estamos disponibles las 24 horas del da, los 7 das de la [place].   Si tiene un problema urgente y no puede comunicarse con nosotros, puede optar por buscar atencin mdica  en el consultorio de su doctor(a), en una clnica privada, en un centro de atencin urgente o en una sala de emergencias.  Si tiene Engineering geologist, por favor llame inmediatamente al 911 o vaya a la sala de emergencias.  Nmeros de bper  - Dr. Nehemiah Massed: [phone]  - Dra. Moye: [phone]  - Dra.  Nicole Kindred: [phone]  En caso de inclemencias del [place], por favor llame a Johnsie Kindred principal al [phone] para una actualizacin sobre el [place] de cualquier retraso o cierre.  Consejos para la medicacin en dermatologa: Por favor, guarde las cajas en las que vienen los medicamentos de uso tpico para ayudarle a seguir las instrucciones sobre dnde y cmo usarlos. Las farmacias generalmente imprimen las instrucciones del medicamento slo en las cajas y no directamente en los tubos del [place].   Si su medicamento es muy caro, por favor, pngase en contacto con Zigmund Daniel llamando al [phone] y presione la opcin 4 o envenos un mensaje a travs de Pharmacist, community.   No podemos decirle cul ser su copago por los medicamentos por adelantado ya que esto es diferente dependiendo de la cobertura de su seguro. Sin embargo, es posible que podamos encontrar un medicamento sustituto a Electrical engineer un formulario para que el seguro cubra el medicamento que se considera necesario.   Si se requiere una autorizacin previa para que su compaa de seguros [place] su medicamento, por favor permtanos de 1 a 2 das hbiles para completar este proceso.  Los precios de los medicamentos varan con frecuencia dependiendo del Environmental consultant de dnde se surte la receta y alguna farmacias pueden ofrecer precios ms baratos.  El sitio web www.goodrx.com tiene cupones para medicamentos de Airline pilot. Los precios aqu no tienen en cuenta lo que podra costar con la ayuda del seguro (puede ser ms barato con su seguro), pero el sitio web puede darle el precio si no utiliz Research scientist (physical sciences).  - Puede imprimir el cupn correspondiente y llevarlo con su receta a la farmacia.  - Tambin puede pasar por nuestra oficina durante el horario de atencin regular y Charity fundraiser una tarjeta de cupones de GoodRx.  - Si necesita que su receta se enve electrnicamente a una farmacia diferente, informe a nuestra oficina a  travs de MyChart de [place] o por telfono llamando al [phone] y presione la opcin 4.

## 2022-06-17 ENCOUNTER — Other Ambulatory Visit: Payer: Self-pay

## 2022-06-17 MED ORDER — TRIAMCINOLONE ACETONIDE 0.1 % EX OINT: 1.0000 | TOPICAL_OINTMENT | Freq: Every day | CUTANEOUS | 0 refills | Status: DC | PRN

## 2022-06-17 NOTE — Progress Notes (Signed)
Triamcinolone Ointment sent in per office note 06/16/22.  See MyChart msg. aw

## 2022-06-19 ENCOUNTER — Encounter: Payer: Self-pay | Admitting: Dermatology

## 2022-06-29 ENCOUNTER — Ambulatory Visit (INDEPENDENT_AMBULATORY_CARE_PROVIDER_SITE_OTHER): Payer: Medicare Other | Admitting: Dermatology

## 2022-06-29 DIAGNOSIS — L209 Atopic dermatitis, unspecified: Secondary | ICD-10-CM

## 2022-06-29 MED ORDER — DUPILUMAB 300 MG/2ML ~~LOC~~ SOSY
300.0000 mg | PREFILLED_SYRINGE | Freq: Once | SUBCUTANEOUS | Status: AC
  Administered 2022-06-29: 300 mg via SUBCUTANEOUS

## 2022-06-29 NOTE — Progress Notes (Signed)
Patient here today for 2 week injection for Severe Atopic Dermatitis.   Patient injected today with Dupixent '300mg'$  into the Right Upper Arm. Patient tolerated procedure well.   YLT:[id number] Exp:05/2024 NSQ:[id number]  Dicie Beam RMA  Documentation: I have reviewed the above documentation for accuracy and completeness, and I agree with the above.  Forest Gleason, MD

## 2022-07-01 ENCOUNTER — Telehealth: Payer: Self-pay | Admitting: Internal Medicine

## 2022-07-01 ENCOUNTER — Telehealth: Payer: Self-pay | Admitting: *Deleted

## 2022-07-01 NOTE — Telephone Encounter (Signed)
Dr. B recommends patient be scheduled for Venofer weekly x2.

## 2022-07-01 NOTE — Telephone Encounter (Signed)
Please schedule venofer weekly x2.   Discussed with HS.   Thanks GB

## 2022-07-01 NOTE — Telephone Encounter (Signed)
Seen by Judson Roch on 10/2:  Anemia: Chronic in nature. Currently on PRN Venofer treatments. Hgb is 11.4 today with her last reading being 11.3. Her MV is slightly elevated at 104.9 - B12 pending however her last B12 level on 04/28/2022 by outside specialist was 1,017. Iron/TIBC/Ferritin levels pending at time of visit. Last ferritin was 39 back in May when she had her last Venofer treatment (Hgb at that time was 11.3). Discussed completing 1 dose of IV Venofer today vs waiting for her iron studies to result. She elects to return once labs have resulted. We will keep her plan of RTC 4 months with labs. If her ferritin is <20 I would recommend 5 weekly treatments of Venofer. If her ferritin is <30 I would recommend 4 weekly treatments of Venofer. If her ferritin is 30-40 I would recommend 3 weekly treatments of IV Venofer. If her ferritin level is 40-50 I would recommend 1-2 dose of Iv Iron.    Disposition: No IV Venofer today RTC for IV venofer will depend on ferritin level as stated above RTC 4 months labs D1(CBC, iron/TIBC, ferritin, folate-given macrocytosis), MD +_ Venofer D2

## 2022-07-01 NOTE — Telephone Encounter (Signed)
Patient called stating that PA was waiting on one other lab result to come in and would let her know if she would get an infusion or not and she still has not heard from her. She states she feels she needs the infusion as she is very tired. Please advise. Vitamin B12 Order: [id number] Status: Final result    Visible to patient: Yes (seen)    Next appt: 07/13/2022 at 11:30 AM in Dermatology ([place])    Dx: Iron deficiency    0 Result Notes           Component Ref Range & Units 3 wk ago (06/08/22) 2 mo ago (04/28/22) 1 yr ago (04/25/21) 1 yr ago (10/03/20) 2 yr ago (08/08/19) 3 yr ago (02/28/19) 3 yr ago (09/23/18)  Vitamin B-12 180 - 914 pg/mL 1,458 High   1,017 High  R  924 High  R  496 R  452 R  482 R  215 R   Comment: (NOTE)  This assay is not validated for testing neonatal or  myeloproliferative syndrome specimens for Vitamin B12 levels.  Performed at [facility], [place] [address], [place], [place]  [address]   Resulting Agency  [facility] CLIN LAB [place] [facility]         Specimen Collected: 06/08/22 14:06 Last Resulted: 06/08/22 19:59      Lab Flowsheet    Order Details    View Encounter    Lab and Collection Details    Routing    Result History    View All Conversations on this Encounter      R=Reference range differs from displayed range      Result Care Coordination   Patient Communication   Add Comments   Seen Back to Top       Other Results from 06/08/2022  Ferritin Order: [id number] Status: Final result    Visible to patient: Yes (not seen)    Next appt: 07/13/2022 at 11:30 AM in Dermatology ([facility]-NURSE ROOM)    Dx: Iron deficiency    0 Result Notes           Component Ref Range & Units 3 wk ago (06/08/22) 5 mo ago (01/13/22) 9 mo ago (09/16/21) 1 yr ago (04/25/21) 1 yr ago (10/03/20) 9 yr ago (03/27/13) 9 yr ago (11/25/12)  Ferritin 11 - 307 ng/mL 66  39 CM  63 CM  7.1 Low   R  40.8 R  92.7 R  89.6 R   Comment: Performed at [facility], [address] [place],  [address]  Resulting Agency  [facility] CLIN LAB [facility] CLIN LAB [facility] CLIN LAB [place]         Specimen Collected: 06/08/22 14:06 Last Resulted: 06/08/22 15:57      Lab Flowsheet    Order Details    View Encounter    Lab and Collection Details    Routing    Result History    View All Conversations on this Encounter      CM=Additional comments  R=Reference range differs from displayed range      Result Care Coordination   Patient Communication   Add Comments   Add Notifications  Back to Top         Iron and TIBC Order: [id number] Status: Final result    Visible to patient: Yes (seen)    Next appt: 07/13/2022 at 11:30  AM in Dermatology ([place])    Dx: Iron deficiency    0 Result Notes             Component Ref Range & Units 3 wk ago (06/08/22) 5 mo ago (01/13/22) 9 mo ago (09/16/21) 1 yr ago (04/25/21) 1 yr ago (10/03/20) 9 yr ago (03/27/13) 10 yr ago (06/08/12) 10 yr ago (06/08/12) 10 yr ago (06/08/12)  Iron 28 - 170 ug/dL 90  89 CM  117  9 Low  R  91 R  109 R  117 R  116 R  116 R   TIBC 250 - 450 ug/dL 391  403  444  565.6 High  R    353 R     Saturation Ratios 10.4 - 31.8 % '23  22  26  '$ 1.6 Low  R  20.8 R  28.7 R    30.4 R   UIBC ug/dL 301  314 CM  327 CM     236 R     Comment: Performed at [facility], [address] [place], [place] [address]  Resulting Agency  [facility] CLIN LAB [facility] CLIN LAB [facility] CLIN [place] [facility]         Specimen Collected: 06/08/22 14:06 Last Resulted: 06/08/22 15:57      Lab Flowsheet    Order Details    View Encounter    Lab and Collection Details    Routing    Result History    View All Conversations on this Encounter      CM=Additional comments  R=Reference range differs from displayed range       Result Care Coordination   Patient Communication   Add Comments   Seen Back to Top          Contains abnormal data CBC with Differential/Platelet Order: [id number] Status: Final result    Visible to patient: Yes (seen)    Next appt: 07/13/2022 at 11:30 AM in Dermatology ([facility]-NURSE ROOM)    Dx: Iron deficiency    0 Result Notes           Component Ref Range & Units 3 wk ago (06/08/22) 5 mo ago (01/13/22) 9 mo ago (09/16/21) 1 yr ago (06/18/21) 1 yr ago (05/16/21) 1 yr ago (04/25/21) 1 yr ago (10/07/20)  WBC 4.0 - 10.5 K/uL 5.6  6.5  6.9  5.2  4.9  9.1  6.3   RBC 3.87 - 5.11 MIL/uL 3.27 Low   3.21 Low   3.56 Low   3.81 Low   3.08 Low   2.62 aL Low  R  3.88 R   Hemoglobin 12.0 - 15.0 g/dL 11.4 Low   11.3 Low   12.1  11.6 Low   9.0 Low   7.2 cL Low Panic   13.0   HCT 36.0 - 46.0 % 34.3 Low   34.0 Low   36.7  37.4  29.8 Low   24.2 aL Low   40.1   MCV 80.0 - 100.0 fL 104.9 High   105.9 High   103.1 High   98.2  96.8  92.4 R  103.3 High  R   MCH 26.0 - 34.0 pg 34.9 High   35.2 High   34.0  30.4  29.2     MCHC 30.0 - 36.0 g/dL 33.2  33.2  33.0  31.0  30.2  29.9 Low   32.5   RDW 11.5 - 15.5 %  13.3  13.1  15.8 High   21.3 High   23.1 High   15.5  14.0   Platelets 150 - 400 K/uL 258  258  287  305  354  405.0 High  R  334.0 R   nRBC 0.0 - 0.2 % 0.0  0.0  0.0  0.0  0.0     Neutrophils Relative % % 51  50  52  45  49  80.8 High  R  53.1 R   Neutro Abs 1.7 - 7.7 K/uL 2.9  3.3  3.6  2.4  2.4  7.3 R  3.4 R   Lymphocytes Relative % 36  39  36  43  39  11.3 Low  R  34.4 R   Lymphs Abs 0.7 - 4.0 K/uL 2.0  2.5  2.5  2.2  1.9  1.0  2.2   Monocytes Relative % '9  8  9  8  9  '$ 6.5 R  9.4 R   Monocytes Absolute 0.1 - 1.0 K/uL 0.5  0.5  0.6  0.4  0.4  0.6  0.6   Eosinophils Relative % '2  2  2  3  2  '$ 0.7 R  1.9 R   Eosinophils Absolute 0.0 - 0.5 K/uL 0.1  0.1  0.1  0.1  0.1  0.1 R  0.1 R   Basophils Relative % '1  1  1  1  1  '$ 0.7 R  1.2 R   Basophils Absolute 0.0 - 0.1 K/uL 0.1  0.1  0.1  0.0  0.1  0.1  0.1    Immature Granulocytes % 1  0  0  0  0     Abs Immature Granulocytes 0.00 - 0.07 K/uL 0.03  0.02 CM  0.03 CM  0.01 CM  0.01 CM     Comment: Performed at [facility], [address] [place], [place] [address]  Resulting Agency  [place] CLIN LAB [place] CLIN LAB [place] CLIN LAB [place] CLIN LAB [place] CLIN LAB [place] [facility] [place] [facility]         Specimen Collected: 06/08/22 14:06 Last Resulted: 06/08/22 14:25      Lab Flowsheet    Order Details    View Encounter    Lab and Collection Details    Routing    Result History    View All Conversations on this Encounter      CM=Additional comments  R=Reference range differs from displayed range      Result Care Coordination   Patient Communication   Add Comments   Seen Back to Top          Contains abnormal data Basic metabolic panel Order: [id number] Status: Final result    Visible to patient: Yes (seen)    Next appt: 07/13/2022 at 11:30 AM in Dermatology (ASC-NURSE ROOM)    Dx: Iron deficiency    0 Result Notes           Component Ref Range & Units 3 wk ago (06/08/22) 2 mo ago (04/28/22) 5 mo ago (01/13/22) 9 mo ago (09/16/21) 10 mo ago (08/11/21) 1 yr ago (06/18/21) 1 yr ago (04/25/21)  Sodium 135 - 145 mmol/L 139  137 R  135  136  139 R  137  139 R   Potassium 3.5 - 5.1 mmol/L 4.8  5.1 R  4.2  4.1  4.5 R  4.1  4.0 R   Chloride 98 - 111 mmol/L 109  103 R  104  101  102 R  101  105 R   CO2 22 - 32 mmol/L 25  23 R  '23  25  28 '$ R  25  23 R   Glucose, Bld 70 - 99 mg/dL 105 High   91  92 CM  87 CM  81  95 CM  102 High    Comment: Glucose reference range applies only to samples taken after fasting for at least 8 hours.  BUN 8 - 23 mg/dL 29 High   29 High  R  23  25 High   16 R  23  30 High  R   Creatinine, Ser 0.44 - 1.00 mg/dL 1.39 High   1.47 High  R  1.18 High   1.14 High   1.19 R  1.22 High   1.45 High  R   Calcium 8.9 - 10.3 mg/dL 9.5  9.7 R  9.1  9.0  9.7 R  9.2  9.6 R   GFR, Estimated >60 mL/min 38 Low    46 Low  CM  48 Low  CM   45  Low  CM    Comment: (NOTE)  Calculated using the CKD-EPI Creatinine Equation (2021)   Anion gap 5 - '15 5   8 '$ CM  10 CM   11 CM    Comment: Performed at [facility], [address] [place], [place] [address]  Resulting Agency  [facility] CLIN [place] [facility] CLIN LAB [place] CLIN [place] CLIN LAB [place]         Specimen Collected: 06/08/22 14:06 Last Resulted: 06/08/22 14:28

## 2022-07-02 ENCOUNTER — Encounter: Payer: Self-pay | Admitting: Internal Medicine

## 2022-07-06 ENCOUNTER — Encounter: Payer: Self-pay | Admitting: Dermatology

## 2022-07-07 ENCOUNTER — Inpatient Hospital Stay: Payer: Medicare Other

## 2022-07-07 VITALS — BP 126/58 | HR 69 | Temp 97.9°F

## 2022-07-07 DIAGNOSIS — Z79899 Other long term (current) drug therapy: Secondary | ICD-10-CM | POA: Diagnosis not present

## 2022-07-07 DIAGNOSIS — D509 Iron deficiency anemia, unspecified: Secondary | ICD-10-CM | POA: Diagnosis not present

## 2022-07-07 DIAGNOSIS — N183 Chronic kidney disease, stage 3 unspecified: Secondary | ICD-10-CM | POA: Diagnosis not present

## 2022-07-07 DIAGNOSIS — E611 Iron deficiency: Secondary | ICD-10-CM

## 2022-07-07 MED ORDER — SODIUM CHLORIDE 0.9 % IV SOLN
200.0000 mg | Freq: Once | INTRAVENOUS | Status: AC
  Administered 2022-07-07: 200 mg via INTRAVENOUS
  Filled 2022-07-07: qty 200

## 2022-07-07 MED ORDER — SODIUM CHLORIDE 0.9 % IV SOLN
Freq: Once | INTRAVENOUS | Status: AC
  Filled 2022-07-07: qty 250

## 2022-07-07 NOTE — Patient Instructions (Signed)

## 2022-07-13 ENCOUNTER — Ambulatory Visit (INDEPENDENT_AMBULATORY_CARE_PROVIDER_SITE_OTHER): Payer: Medicare Other | Admitting: Dermatology

## 2022-07-13 DIAGNOSIS — L209 Atopic dermatitis, unspecified: Secondary | ICD-10-CM

## 2022-07-13 MED ORDER — DUPILUMAB 300 MG/2ML ~~LOC~~ SOSY
300.0000 mg | PREFILLED_SYRINGE | Freq: Once | SUBCUTANEOUS | Status: AC
  Administered 2022-07-13: 300 mg via SUBCUTANEOUS

## 2022-07-13 NOTE — Progress Notes (Signed)
Patient here today for 2 week injection for Severe Atopic Dermatitis.    Patient injected today with Dupixent '300mg'$  into the Leftt Upper Arm. Patient tolerated procedure well.    GJG:[id number] Exp:11/2024 ZWR:[id number]   Dicie Beam RMA   Documentation: I have reviewed the above documentation for accuracy and completeness, and I agree with the above.  Forest Gleason, MD

## 2022-07-14 ENCOUNTER — Inpatient Hospital Stay: Payer: Medicare Other | Attending: Internal Medicine

## 2022-07-14 VITALS — BP 108/65 | HR 76 | Temp 98.4°F | Resp 16

## 2022-07-14 DIAGNOSIS — D509 Iron deficiency anemia, unspecified: Secondary | ICD-10-CM | POA: Diagnosis not present

## 2022-07-14 DIAGNOSIS — N183 Chronic kidney disease, stage 3 unspecified: Secondary | ICD-10-CM | POA: Insufficient documentation

## 2022-07-14 DIAGNOSIS — E611 Iron deficiency: Secondary | ICD-10-CM

## 2022-07-14 MED ORDER — SODIUM CHLORIDE 0.9 % IV SOLN
200.0000 mg | Freq: Once | INTRAVENOUS | Status: AC
  Administered 2022-07-14: 200 mg via INTRAVENOUS
  Filled 2022-07-14: qty 200

## 2022-07-14 MED ORDER — SODIUM CHLORIDE 0.9 % IV SOLN
Freq: Once | INTRAVENOUS | Status: AC
  Filled 2022-07-14: qty 250

## 2022-07-14 NOTE — Progress Notes (Signed)
Pt tolerated treatment well.  No complaints.  VSS.  Pt denied 30 minute post observation

## 2022-07-14 NOTE — Patient Instructions (Signed)

## 2022-07-18 ENCOUNTER — Encounter: Payer: Self-pay | Admitting: Dermatology

## 2022-07-23 DIAGNOSIS — E7800 Pure hypercholesterolemia, unspecified: Secondary | ICD-10-CM | POA: Diagnosis not present

## 2022-07-23 DIAGNOSIS — U071 COVID-19: Secondary | ICD-10-CM | POA: Diagnosis not present

## 2022-07-23 DIAGNOSIS — I10 Essential (primary) hypertension: Secondary | ICD-10-CM | POA: Diagnosis not present

## 2022-07-23 DIAGNOSIS — N281 Cyst of kidney, acquired: Secondary | ICD-10-CM | POA: Diagnosis not present

## 2022-07-23 DIAGNOSIS — N1832 Chronic kidney disease, stage 3b: Secondary | ICD-10-CM | POA: Diagnosis not present

## 2022-07-23 DIAGNOSIS — N2581 Secondary hyperparathyroidism of renal origin: Secondary | ICD-10-CM | POA: Diagnosis not present

## 2022-07-23 DIAGNOSIS — D631 Anemia in chronic kidney disease: Secondary | ICD-10-CM | POA: Diagnosis not present

## 2022-07-23 DIAGNOSIS — R809 Proteinuria, unspecified: Secondary | ICD-10-CM | POA: Diagnosis not present

## 2022-07-27 ENCOUNTER — Ambulatory Visit (INDEPENDENT_AMBULATORY_CARE_PROVIDER_SITE_OTHER): Payer: Medicare Other | Admitting: Dermatology

## 2022-07-27 ENCOUNTER — Ambulatory Visit: Payer: Medicare Other

## 2022-07-27 DIAGNOSIS — L209 Atopic dermatitis, unspecified: Secondary | ICD-10-CM | POA: Diagnosis not present

## 2022-07-27 DIAGNOSIS — N179 Acute kidney failure, unspecified: Secondary | ICD-10-CM | POA: Diagnosis not present

## 2022-07-27 DIAGNOSIS — I10 Essential (primary) hypertension: Secondary | ICD-10-CM | POA: Diagnosis not present

## 2022-07-27 DIAGNOSIS — N1832 Chronic kidney disease, stage 3b: Secondary | ICD-10-CM | POA: Diagnosis not present

## 2022-07-27 DIAGNOSIS — D631 Anemia in chronic kidney disease: Secondary | ICD-10-CM | POA: Diagnosis not present

## 2022-07-27 DIAGNOSIS — R809 Proteinuria, unspecified: Secondary | ICD-10-CM | POA: Diagnosis not present

## 2022-07-27 DIAGNOSIS — E7800 Pure hypercholesterolemia, unspecified: Secondary | ICD-10-CM | POA: Diagnosis not present

## 2022-07-27 DIAGNOSIS — E538 Deficiency of other specified B group vitamins: Secondary | ICD-10-CM | POA: Diagnosis not present

## 2022-07-27 DIAGNOSIS — U071 COVID-19: Secondary | ICD-10-CM | POA: Diagnosis not present

## 2022-07-27 DIAGNOSIS — N2581 Secondary hyperparathyroidism of renal origin: Secondary | ICD-10-CM | POA: Diagnosis not present

## 2022-07-27 DIAGNOSIS — N281 Cyst of kidney, acquired: Secondary | ICD-10-CM | POA: Diagnosis not present

## 2022-07-27 DIAGNOSIS — R8290 Unspecified abnormal findings in urine: Secondary | ICD-10-CM | POA: Diagnosis not present

## 2022-07-27 MED ORDER — DUPILUMAB 300 MG/2ML ~~LOC~~ SOSY
300.0000 mg | PREFILLED_SYRINGE | Freq: Once | SUBCUTANEOUS | Status: AC
  Administered 2022-07-27: 300 mg via SUBCUTANEOUS

## 2022-07-27 NOTE — Progress Notes (Signed)
Patient here today for 2 week injection for Severe Atopic Dermatitis.    Patient injected today with Dupixent '300mg'$  into the right Upper Arm. Patient tolerated procedure well.    CZG:[id number] Exp:11/2024 DEK:[id number]   Dicie Beam RMA  Documentation: I have reviewed the above documentation for accuracy and completeness, and I agree with the above.  Forest Gleason, MD

## 2022-07-29 DIAGNOSIS — U071 COVID-19: Secondary | ICD-10-CM | POA: Diagnosis not present

## 2022-07-29 DIAGNOSIS — R809 Proteinuria, unspecified: Secondary | ICD-10-CM | POA: Diagnosis not present

## 2022-07-29 DIAGNOSIS — N281 Cyst of kidney, acquired: Secondary | ICD-10-CM | POA: Diagnosis not present

## 2022-07-29 DIAGNOSIS — N2581 Secondary hyperparathyroidism of renal origin: Secondary | ICD-10-CM | POA: Diagnosis not present

## 2022-07-29 DIAGNOSIS — D631 Anemia in chronic kidney disease: Secondary | ICD-10-CM | POA: Diagnosis not present

## 2022-07-29 DIAGNOSIS — I10 Essential (primary) hypertension: Secondary | ICD-10-CM | POA: Diagnosis not present

## 2022-07-29 DIAGNOSIS — E7800 Pure hypercholesterolemia, unspecified: Secondary | ICD-10-CM | POA: Diagnosis not present

## 2022-07-29 DIAGNOSIS — N1832 Chronic kidney disease, stage 3b: Secondary | ICD-10-CM | POA: Diagnosis not present

## 2022-08-02 ENCOUNTER — Encounter: Payer: Self-pay | Admitting: Dermatology

## 2022-08-10 ENCOUNTER — Ambulatory Visit: Payer: Medicare Other

## 2022-08-12 ENCOUNTER — Other Ambulatory Visit: Payer: Self-pay

## 2022-08-12 ENCOUNTER — Ambulatory Visit (INDEPENDENT_AMBULATORY_CARE_PROVIDER_SITE_OTHER): Payer: Medicare Other

## 2022-08-12 DIAGNOSIS — L209 Atopic dermatitis, unspecified: Secondary | ICD-10-CM

## 2022-08-12 MED ORDER — DUPIXENT 300 MG/2ML ~~LOC~~ SOSY: 300.0000 mg | PREFILLED_SYRINGE | SUBCUTANEOUS | 2 refills | Status: DC

## 2022-08-12 MED ORDER — DUPILUMAB 300 MG/2ML ~~LOC~~ SOSY
300.0000 mg | PREFILLED_SYRINGE | Freq: Once | SUBCUTANEOUS | Status: AC
  Administered 2022-08-12: 300 mg via SUBCUTANEOUS

## 2022-08-12 NOTE — Progress Notes (Signed)
Refill request from [facility]

## 2022-08-12 NOTE — Progress Notes (Signed)
Patient here today for 2 week injection for Severe Atopic Dermatitis.    Patient injected today with Dupixent '300mg'$  into the Left Upper Arm. Patient tolerated procedure well.    Lot:[id number] Exp:05/07/2024 RDE:[id number]   [place]

## 2022-08-26 ENCOUNTER — Ambulatory Visit: Payer: Medicare Other

## 2022-08-27 ENCOUNTER — Ambulatory Visit (INDEPENDENT_AMBULATORY_CARE_PROVIDER_SITE_OTHER): Payer: Medicare Other

## 2022-08-27 ENCOUNTER — Other Ambulatory Visit: Payer: Medicare Other

## 2022-08-27 VITALS — Ht 63.9 in | Wt 141.0 lb

## 2022-08-27 DIAGNOSIS — L209 Atopic dermatitis, unspecified: Secondary | ICD-10-CM | POA: Diagnosis not present

## 2022-08-27 DIAGNOSIS — Z0000 Encounter for general adult medical examination without abnormal findings: Secondary | ICD-10-CM

## 2022-08-27 MED ORDER — DUPILUMAB 300 MG/2ML ~~LOC~~ SOSY
300.0000 mg | PREFILLED_SYRINGE | Freq: Once | SUBCUTANEOUS | Status: AC
  Administered 2022-08-27: 300 mg via SUBCUTANEOUS

## 2022-08-27 NOTE — Progress Notes (Signed)
Subjective:   Leah Huber is a 81 y.o. female who presents for Medicare Annual (Subsequent) preventive examination.  Review of Systems    No ROS.  Medicare Wellness Virtual Visit.  Visual/audio telehealth visit, UTA vital signs.   See social history for additional risk factors.   Cardiac Risk Factors include: advanced age (>23mn, >>1women)     Objective:    Today's Vitals   08/27/22 1437  Weight: 141 lb (64 kg)  Height: 5' 3.9" (1.623 m)   Body mass index is 24.28 kg/m.     08/27/2022    2:41 PM 07/07/2022    1:00 PM 06/08/2022    2:21 PM 09/16/2021    1:01 PM 08/25/2021    3:48 PM 06/18/2021    1:47 PM 05/16/2021    1:50 PM  Advanced Directives  Does Patient Have a Medical Advance Directive? Yes No Yes Yes Yes Yes No  Type of [place]of [place] will   [place] will   Does patient want to make changes to medical advance directive? No - Patient declined    No - Patient declined No - Patient declined   Copy of [place] Chart? Yes - validated most recent copy scanned in chart (See row information)    Yes - validated most recent copy scanned in chart (See row information) No - copy requested   Would patient like information on creating a medical advance directive?  No - Patient declined     No - Patient declined    Current Medications (verified) Outpatient Encounter Medications as of 08/27/2022  Medication Sig   clobetasol (TEMOVATE) 0.05 % external solution Apply twice daily to affected areas at least up to 4 weeks.   cyanocobalamin 1000 MCG tablet Take 1,000 mcg by mouth.   DETROL LA 4 MG 24 hr capsule TAKE 1 CAPSULE DAILY   dupilumab (DUPIXENT) 300 MG/2ML prefilled syringe Inject 300 mg into the skin every 14 (fourteen) days. Starting at day 15 for maintenance.   ELIQUIS 2.5 MG TABS tablet Take 2.5 mg  by mouth 2 (two) times daily.   latanoprost (XALATAN) 0.005 % ophthalmic solution Place 1 drop into both eyes at bedtime.    Lifitegrast (XIIDRA) 5 % SOLN Place 1 drop into both eyes daily.   losartan (COZAAR) 25 MG tablet Take 1 tablet by mouth daily.   metoprolol succinate (TOPROL-XL) 25 MG 24 hr tablet Take 25 mg by mouth in the morning and at bedtime.   pantoprazole (PROTONIX) 20 MG tablet One tablet qod   rosuvastatin (CRESTOR) 10 MG tablet Take 1 tablet (10 mg total) by mouth daily.   triamcinolone ointment (KENALOG) 0.1 % Apply 1 Application topically daily as needed. Avoid face, groin and axilla   No facility-administered encounter medications on file as of 08/27/2022.    Allergies (verified) Clindamycin/lincomycin, Dronedarone, Bactrim [sulfamethoxazole-trimethoprim], Morphine, Morphine and related, and Sulfa antibiotics   History: Past Medical History:  Diagnosis Date   Abnormal liver function test 07/23/2019   Anemia    Aortic aneurysm (HCC)    "SMALL"   Arthritis    neck to tailbone   Bilateral carpal tunnel syndrome 12/08/2017   Bronchitis    Chronic cough    @ NIGHT/ SEASONAL ALLERGIES   Clostridium difficile infection 2013-2014   Colon polyp    Concussion    Coronary artery disease    Dr  Kowalski cardiologist   Depression    Diverticulosis    GERD (gastroesophageal reflux disease)    Glaucoma    Gout    Hemorrhoids    History of chicken pox    History of colonic polyps    History of shingles    Hypercholesterolemia    Hypertension    CONTROLLED ON MEDS   Hypovolemia 12/10   acute renal failure   IBS (irritable bowel syndrome)    Neuromuscular disorder (HCC)    occasionally tingling in hands/ NUMBNESS IN RIGHT FOOT s/p back surgery   Osteoarthritis    bilateral knees   Osteopenia    Pneumonia    HX OF   Sleep apnea    very mild   sleep study 8 yrs ago does not use CPAP   Urinary incontinence    Past Surgical History:  Procedure Laterality Date    ABDOMINAL HYSTERECTOMY  1979   ANTERIOR CERVICAL DECOMP/DISCECTOMY FUSION N/A 11/28/2014   Procedure: CERVICAL FIVE-SIX  ANTERIOR CERVICAL DECOMPRESSION/DISCECTOMY FUSION 1 LEVEL;  Surgeon: Newman Pies, MD;  Location: [place] [facility];  Service: Neurosurgery;  Laterality: N/A;  C56 anterior cervical decompression with fusion interbody prosthesis plating and bonegraft   BACK SURGERY     X2/ L4,5 & S1/ Cervical C7?   BLADDER SURGERY  1981   SUSPENSION   BREAST REDUCTION SURGERY     CARDIOVERSION N/A 10/29/2020   Procedure: CARDIOVERSION;  Surgeon: Corey Skains, MD;  Location: [facility];  Service: Cardiovascular;  Laterality: N/A;   CARPAL TUNNEL RELEASE  2007   CATARACT EXTRACTION W/PHACO Right 12/30/2016   Procedure: CATARACT EXTRACTION PHACO AND INTRAOCULAR LENS PLACEMENT (IOC)  Right toric lens;  Surgeon: Leandrew Koyanagi, MD;  Location: [place];  Service: Ophthalmology;  Laterality: Right;  sleep apnea, does not use CPAP   CATARACT EXTRACTION W/PHACO Left 01/20/2017   Procedure: CATARACT EXTRACTION PHACO AND INTRAOCULAR LENS PLACEMENT ([place])  Left Toric;  Surgeon: Leandrew Koyanagi, MD;  Location: [facility];  Service: Ophthalmology;  Laterality: Left;  toric lens   CHOLECYSTECTOMY  1996   COLONOSCOPY     COLONOSCOPY WITH PROPOFOL N/A 05/22/2019   Procedure: COLONOSCOPY WITH PROPOFOL;  Surgeon: [place], Benay Pike, MD;  Location: [facility];  Service: Gastroenterology;  Laterality: N/A;   FOOT SURGERY  2005   JOINT REPLACEMENT Bilateral    KNEE   [place] Bilateral 2001   ROTATOR CUFF REPAIR  2000 & 2004   left and right   SPINAL CORD STIMULATOR IMPLANT  03/1997   SPINAL CORD STIMULATOR REMOVAL  2007   Tendon repair right shoulder     TONSILLECTOMY     TOTAL KNEE ARTHROPLASTY  10/2011 & 09/13   left and right   Family History  Problem Relation Age of Onset   Heart disease Mother    Stroke Mother     Hypertension Mother    Hyperlipidemia Father    Heart disease Father    Heart disease Brother        2 brothers   Alzheimer's disease Brother    Heart disease Sister        pacemaker   Breast cancer Daughter 51   Social History   Socioeconomic History   Marital status: Widowed    Spouse name: Not on file   Number of children: 2   Years of education: Not on file   Highest education level: Not on file  Occupational History  Not on file  Tobacco Use   Smoking status: Former    Packs/day: 1.00    Years: 20.00    Total pack years: 20.00    Types: Cigarettes    Quit date: 09/07/1988    Years since quitting: 33.9   Smokeless tobacco: Never  Vaping Use   Vaping Use: Never used  Substance and Sexual Activity   Alcohol use: Yes    Alcohol/week: 2.0 standard drinks of alcohol    Types: 2 Glasses of wine per week    Comment: occasional   Drug use: No   Sexual activity: Never  Other Topics Concern   Not on file  Social History Narrative   She is widowed, has two daughters. [place]; quit smoking- in 1990s; social alcohol.    Social Determinants of Health   Financial Resource Strain: Low Risk  (08/27/2022)   Overall Financial Resource Strain (CARDIA)    Difficulty of Paying Living Expenses: Not hard at all  Food Insecurity: No Food Insecurity (08/27/2022)   Hunger Vital Sign    Worried About Running Out of Food in the Last Year: Never true    Ran Out of Food in the Last Year: Never true  Transportation Needs: No Transportation Needs (08/24/2022)   PRAPARE - Hydrologist (Medical): No    Lack of Transportation (Non-Medical): No  Physical Activity: Insufficiently Active (08/27/2022)   Exercise Vital Sign    Days of Exercise per Week: 3 days    Minutes of Exercise per Session: 30 min  Stress: No Stress Concern Present (08/27/2022)   [place]    Feeling of Stress : Only a little   Social Connections: Unknown (08/27/2022)   Social Connection and Isolation Panel [NHANES]    Frequency of Communication with Friends and Family: More than three times a week    Frequency of Social Gatherings with Friends and Family: Three times a week    Attends Religious Services: Not on file    Active Member of Clubs or Organizations: Yes    Attends Club or Organization Meetings: More than 4 times per year    Marital Status: Widowed    Tobacco Counseling Counseling given: Not Answered   Clinical Intake:  Pre-visit preparation completed: Yes        Diabetes: No  How often do you need to have someone help you when you read instructions, pamphlets, or other written materials from your doctor or pharmacy?: 1 - Never   Interpreter Needed?: No    Activities of Daily Living    08/27/2022    2:42 PM 08/24/2022    5:59 PM  In your present state of health, do you have any difficulty performing the following activities:  Hearing? 0 0  Vision? 0 0  Difficulty concentrating or making decisions? 0 0  Walking or climbing stairs? 1 1  Dressing or bathing? 0 0  Doing errands, shopping? 0 0  Preparing Food and eating ? N N  Using the Toilet? N N  In the past six months, have you accidently leaked urine? Y Y  Comment Managed with daily liner   Do you have problems with loss of bowel control? N N  Managing your Medications? N N  Managing your Finances? N N  Housekeeping or managing your Housekeeping? N N    Patient Care Team: Einar Pheasant, MD as PCP - General (Internal Medicine) Efrain Sella, MD as Consulting Physician (Gastroenterology)  Cammie Sickle, MD as Consulting Physician (Internal Medicine)  Indicate any recent Medical Services you may have received from other than [facility] providers in the past year (date may be approximate).     Assessment:   This is a routine wellness examination for [facility].  I connected with  Leah Huber on 08/27/22 by a audio  enabled telemedicine application and verified that I am speaking with the correct person using two identifiers.  Patient Location: Home  Provider Location: Office/Clinic  I discussed the limitations of evaluation and management by telemedicine. The patient expressed understanding and agreed to proceed.   Hearing/Vision screen Hearing Screening - Comments:: Patient is able to hear conversational tones without difficulty. No issues reported. Vision Screening - Comments:: Wears corrective lenses  Cataract extraction, bilateral  Glaucoma; drops in use  They have seen their ophthalmologist in the last 6 months.   Dietary issues and exercise activities discussed: Current Exercise Habits: Home exercise routine, Type of exercise: walking, Time (Minutes): 20, Frequency (Times/Week): 1, Weekly Exercise (Minutes/Week): 20, Intensity: Mild   Goals Addressed               This Visit's Progress     Patient Stated     Maintain Healthy Lifestyle (pt-stated)        Stay active Healthy diet Stay hydrated       Depression Screen    08/27/2022    2:40 PM 05/01/2022    2:33 PM 11/18/2021    4:01 PM 08/25/2021    3:45 PM 08/11/2021    8:51 AM 05/06/2020   11:14 AM 05/04/2019   11:50 AM  PHQ 2/9 Scores  PHQ - 2 Score 0 0 0 0 0 0 0    Fall Risk    08/27/2022    2:42 PM 08/24/2022    5:59 PM 05/01/2022    2:33 PM 11/18/2021    4:01 PM 08/25/2021    3:49 PM  [place] in the past year? 1 1 0 0 0  Number falls in past yr:  0 0  0  Injury with Fall?  1 0    Risk for fall due to :   No Fall Risks No Fall Risks   Follow up Falls evaluation completed  Falls evaluation completed Falls evaluation completed Falls evaluation completed    [place]: Home free of loose throw rugs in walkways, pet beds, electrical cords, etc? Yes  Adequate lighting in your home to reduce risk of falls? Yes   ASSISTIVE DEVICES UTILIZED TO PREVENT FALLS: Life alert? No   Use of a cane, walker or w/c? Yes  Grab bars in the bathroom? No  Shower chair or bench in shower? No  Comfort chair height toilet? Yes   TIMED UP AND GO: Was the test performed? No .   Cognitive Function:    09/18/2016   11:27 AM  MMSE - Mini Mental State Exam  Orientation to time 5  Orientation to Place 5  Registration 3  Attention/ Calculation 5  Recall 3  Language- name 2 objects 2  Language- repeat 1  Language- follow 3 step command 3  Language- read & follow direction 1  Write a sentence 1  Copy design 1  Total score 30        08/27/2022    2:44 PM 05/04/2019   11:51 AM 10/26/2017    4:50 PM  6CIT Screen  What Year? 0 points 0  points 0 points  What month? 0 points 0 points 0 points  What time? 0 points 0 points 0 points  Count back from 20 0 points 0 points 0 points  Months in reverse 0 points 0 points 0 points  Repeat phrase 0 points 0 points 0 points  Total Score 0 points 0 points 0 points    Immunizations Immunization History  Administered Date(s) Administered   Fluad Quad(high Dose 65+) 05/30/2019, 06/04/2020, 08/11/2021   Influenza Split 06/29/2014   Influenza, High Dose Seasonal PF 07/27/2016, 06/27/2018   Influenza,inj,Quad PF,6+ Mos 06/18/2015   Influenza-Unspecified 06/18/2015, 07/27/2016, 05/22/2017   PFIZER(Purple Top)SARS-COV-2 Vaccination 09/12/2019, 10/03/2019   Pneumococcal Conjugate-13 07/30/2014   Pneumococcal Polysaccharide-23 03/18/2017   TDAP status: Due, Education has been provided regarding the importance of this vaccine. Advised may receive this vaccine at local pharmacy or Health Dept. Aware to provide a copy of the vaccination record if obtained from local pharmacy or Health Dept. Verbalized acceptance and understanding.  Flu Vaccine status: Due, Education has been provided regarding the importance of this vaccine. Advised may receive this vaccine at local pharmacy or Health Dept. Aware to provide a copy of the vaccination record  if obtained from local pharmacy or Health Dept. Verbalized acceptance and understanding.  Covid-19 vaccine status: Completed vaccines x2.  Shingrix Completed?: No.    Education has been provided regarding the importance of this vaccine. Patient has been advised to call insurance company to determine out of pocket expense if they have not yet received this vaccine. Advised may also receive vaccine at local pharmacy or Health Dept. Verbalized acceptance and understanding.  Screening Tests Health Maintenance  Topic Date Due   DTaP/Tdap/Td (1 - Tdap) Never done   COVID-19 Vaccine (3 - Pfizer risk series) 09/12/2022 (Originally 10/31/2019)   Zoster Vaccines- Shingrix (1 of 2) 11/26/2022 (Originally 12/03/1959)   INFLUENZA VACCINE  12/06/2022 (Originally 04/07/2022)   MAMMOGRAM  11/22/2022   Medicare Annual Wellness (AWV)  08/28/2023   COLONOSCOPY (Pts 45-36yr Insurance coverage will need to be confirmed)  05/21/2024   Pneumonia Vaccine 81 Years old  Completed   DEXA SCAN  Completed   HPV VACCINES  Aged Out   Health Maintenance Health Maintenance Due  Topic Date Due   DTaP/Tdap/Td (1 - Tdap) Never done   Mammogram- ordered per consent. Number offered for scheduling [phone]   Lung Cancer Screening: (Low Dose CT Chest recommended if Age 82-80years, 30 pack-year currently smoking OR have quit w/in 15years.) does not qualify.   Hepatitis C Screening: does not qualify.  Vision Screening: Recommended annual ophthalmology exams for early detection of glaucoma and other disorders of the eye.  Dental Screening: Recommended annual dental exams for proper oral hygiene  Community Resource Referral / Chronic Care Management: CRR required this visit?  No   CCM required this visit?  No      Plan:     I have personally reviewed and noted the following in the patient's chart:   Medical and social history Use of alcohol, tobacco or illicit drugs  Current medications and supplements  including opioid prescriptions. Patient is not currently taking opioid prescriptions. Functional ability and status Nutritional status Physical activity Advanced directives List of other physicians Hospitalizations, surgeries, and ER visits in previous 12 months Vitals Screenings to include cognitive, depression, and falls Referrals and appointments  In addition, I have reviewed and discussed with patient certain preventive protocols, quality metrics, and best practice recommendations. A written personalized care plan for  preventive services as well as general preventive health recommendations were provided to patient.     Leta Jungling, LPN   [id number]

## 2022-08-27 NOTE — Progress Notes (Signed)
Patient here today for 2 week injection for Severe Atopic Dermatitis.    Patient injected today with Dupixent '300mg'$  into the Right Upper Arm. Patient tolerated procedure well.    TYO:[id number] Exp:12/05/2024 TXH:[id number]   [place]

## 2022-08-27 NOTE — Patient Instructions (Addendum)
Ms. Leah Huber , Thank you for taking time to come for your Medicare Wellness Visit. I appreciate your ongoing commitment to your health goals. Please review the following plan we discussed and let me know if I can assist you in the future.   These are the goals we discussed:  Goals       Patient Stated     Maintain Healthy Lifestyle (pt-stated)      Stay active Healthy diet Stay hydrated        This is a list of the screening recommended for you and due dates:  Health Maintenance  Topic Date Due   DTaP/Tdap/Td vaccine (1 - Tdap) Never done   COVID-19 Vaccine (3 - Pfizer risk series) 09/12/2022*   Zoster (Shingles) Vaccine (1 of 2) 11/26/2022*   Flu Shot  12/06/2022*   Mammogram  11/22/2022   Medicare Annual Wellness Visit  08/28/2023   Colon Cancer Screening  05/21/2024   Pneumonia Vaccine  Completed   DEXA scan (bone density measurement)  Completed   HPV Vaccine  Aged Out  *Topic was postponed. The date shown is not the original due date.    Advanced directives: on file  Conditions/risks identified: none new  Next appointment: Follow up in one year for your annual wellness visit    Preventive Care 65 Years and Older, Female Preventive care refers to lifestyle choices and visits with your health care provider that can promote health and wellness. What does preventive care include? A yearly physical exam. This is also called an annual well check. Dental exams once or twice a year. Routine eye exams. Ask your health care provider how often you should have your eyes checked. Personal lifestyle choices, including: Daily care of your teeth and gums. Regular physical activity. Eating a healthy diet. Avoiding tobacco and drug use. Limiting alcohol use. Practicing safe sex. Taking low-dose aspirin every day. Taking vitamin and mineral supplements as recommended by your health care provider. What happens during an annual well check? The services and screenings done by your  health care provider during your annual well check will depend on your age, overall health, lifestyle risk factors, and family history of disease. Counseling  Your health care provider may ask you questions about your: Alcohol use. Tobacco use. Drug use. Emotional well-being. Home and relationship well-being. Sexual activity. Eating habits. History of falls. Memory and ability to understand (cognition). Work and work Statistician. Reproductive health. Screening  You may have the following tests or measurements: Height, weight, and BMI. Blood pressure. Lipid and cholesterol levels. These may be checked every 5 years, or more frequently if you are over 69 years old. Skin check. Lung cancer screening. You may have this screening every year starting at age 65 if you have a 30-pack-year history of smoking and currently smoke or have quit within the past 15 years. Fecal occult blood test (FOBT) of the stool. You may have this test every year starting at age 24. Flexible sigmoidoscopy or colonoscopy. You may have a sigmoidoscopy every 5 years or a colonoscopy every 10 years starting at age 24. Hepatitis C blood test. Hepatitis B blood test. Sexually transmitted disease (STD) testing. Diabetes screening. This is done by checking your blood sugar (glucose) after you have not eaten for a while (fasting). You may have this done every 1-3 years. Bone density scan. This is done to screen for osteoporosis. You may have this done starting at age 4. Mammogram. This may be done every 1-2 years. Talk to  your health care provider about how often you should have regular mammograms. Talk with your health care provider about your test results, treatment options, and if necessary, the need for more tests. Vaccines  Your health care provider may recommend certain vaccines, such as: Influenza vaccine. This is recommended every year. Tetanus, diphtheria, and acellular pertussis (Tdap, Td) vaccine. You may  need a Td booster every 10 years. Zoster vaccine. You may need this after age 76. Pneumococcal 13-valent conjugate (PCV13) vaccine. One dose is recommended after age 37. Pneumococcal polysaccharide (PPSV23) vaccine. One dose is recommended after age 66. Talk to your health care provider about which screenings and vaccines you need and how often you need them. This information is not intended to replace advice given to you by your health care provider. Make sure you discuss any questions you have with your health care provider. Document Released: 09/20/2015 Document Revised: 05/13/2016 Document Reviewed: 06/25/2015 Elsevier Interactive Patient Education  2017 [place] Prevention in the Home Falls can cause injuries. They can happen to people of all ages. There are many things you can do to make your home safe and to help prevent falls. What can I do on the outside of my home? Regularly fix the edges of walkways and driveways and fix any cracks. Remove anything that might make you trip as you walk through a door, such as a raised step or threshold. Trim any bushes or trees on the path to your home. Use bright outdoor lighting. Clear any walking paths of anything that might make someone trip, such as rocks or tools. Regularly check to see if handrails are loose or broken. Make sure that both sides of any steps have handrails. Any raised decks and porches should have guardrails on the edges. Have any leaves, snow, or ice cleared regularly. Use sand or salt on walking paths during winter. Clean up any spills in your garage right away. This includes oil or grease spills. What can I do in the bathroom? Use night lights. Install grab bars by the toilet and in the tub and shower. Do not use towel bars as grab bars. Use non-skid mats or decals in the tub or shower. If you need to sit down in the shower, use a plastic, non-slip stool. Keep the floor dry. Clean up any water that spills on  the floor as soon as it happens. Remove soap buildup in the tub or shower regularly. Attach bath mats securely with double-sided non-slip rug tape. Do not have throw rugs and other things on the floor that can make you trip. What can I do in the bedroom? Use night lights. Make sure that you have a light by your bed that is easy to reach. Do not use any sheets or blankets that are too big for your bed. They should not hang down onto the floor. Have a firm chair that has side arms. You can use this for support while you get dressed. Do not have throw rugs and other things on the floor that can make you trip. What can I do in the kitchen? Clean up any spills right away. Avoid walking on wet floors. Keep items that you use a lot in easy-to-reach places. If you need to reach something above you, use a strong step stool that has a grab bar. Keep electrical cords out of the way. Do not use floor polish or wax that makes floors slippery. If you must use wax, use non-skid floor wax. Do not  have throw rugs and other things on the floor that can make you trip. What can I do with my stairs? Do not leave any items on the stairs. Make sure that there are handrails on both sides of the stairs and use them. Fix handrails that are broken or loose. Make sure that handrails are as long as the stairways. Check any carpeting to make sure that it is firmly attached to the stairs. Fix any carpet that is loose or worn. Avoid having throw rugs at the top or bottom of the stairs. If you do have throw rugs, attach them to the floor with carpet tape. Make sure that you have a light switch at the top of the stairs and the bottom of the stairs. If you do not have them, ask someone to add them for you. What else can I do to help prevent falls? Wear shoes that: Do not have high heels. Have rubber bottoms. Are comfortable and fit you well. Are closed at the toe. Do not wear sandals. If you use a stepladder: Make sure  that it is fully opened. Do not climb a closed stepladder. Make sure that both sides of the stepladder are locked into place. Ask someone to hold it for you, if possible. Clearly mark and make sure that you can see: Any grab bars or handrails. First and last steps. Where the edge of each step is. Use tools that help you move around (mobility aids) if they are needed. These include: Canes. Walkers. Scooters. Crutches. Turn on the lights when you go into a dark area. Replace any light bulbs as soon as they burn out. Set up your furniture so you have a clear path. Avoid moving your furniture around. If any of your floors are uneven, fix them. If there are any pets around you, be aware of where they are. Review your medicines with your doctor. Some medicines can make you feel dizzy. This can increase your chance of falling. Ask your doctor what other things that you can do to help prevent falls. This information is not intended to replace advice given to you by your health care provider. Make sure you discuss any questions you have with your health care provider. Document Released: 06/20/2009 Document Revised: 01/30/2016 Document Reviewed: 09/28/2014 Elsevier Interactive Patient Education  2017 [employer].

## 2022-09-01 ENCOUNTER — Ambulatory Visit: Payer: Medicare Other | Admitting: Internal Medicine

## 2022-09-04 ENCOUNTER — Other Ambulatory Visit (INDEPENDENT_AMBULATORY_CARE_PROVIDER_SITE_OTHER): Payer: Medicare Other

## 2022-09-04 DIAGNOSIS — E7800 Pure hypercholesterolemia, unspecified: Secondary | ICD-10-CM | POA: Diagnosis not present

## 2022-09-04 DIAGNOSIS — I10 Essential (primary) hypertension: Secondary | ICD-10-CM | POA: Diagnosis not present

## 2022-09-04 LAB — HEPATIC FUNCTION PANEL
ALT: 10 U/L (ref 0–35)
AST: 19 U/L (ref 0–37)
Albumin: 4.6 g/dL (ref 3.5–5.2)
Alkaline Phosphatase: 51 U/L (ref 39–117)
Bilirubin, Direct: 0.1 mg/dL (ref 0.0–0.3)
Total Bilirubin: 0.5 mg/dL (ref 0.2–1.2)
Total Protein: 6.8 g/dL (ref 6.0–8.3)

## 2022-09-04 LAB — BASIC METABOLIC PANEL
BUN: 24 mg/dL — ABNORMAL HIGH (ref 6–23)
CO2: 26 mEq/L (ref 19–32)
Calcium: 9.7 mg/dL (ref 8.4–10.5)
Chloride: 104 mEq/L (ref 96–112)
Creatinine, Ser: 1.2 mg/dL (ref 0.40–1.20)
GFR: 42.08 mL/min — ABNORMAL LOW (ref >60.00)
Glucose, Bld: 95 mg/dL (ref 70–99)
Potassium: 4.3 mEq/L (ref 3.5–5.1)
Sodium: 141 mEq/L (ref 135–145)

## 2022-09-04 LAB — LIPID PANEL
Cholesterol: 291 mg/dL — ABNORMAL HIGH (ref 0–200)
HDL: 106.1 mg/dL (ref >39.00)
LDL Cholesterol: 154 mg/dL — ABNORMAL HIGH (ref 0–99)
NonHDL: 184.88
Total CHOL/HDL Ratio: 3
Triglycerides: 152 mg/dL — ABNORMAL HIGH (ref 0.0–149.0)
VLDL: 30.4 mg/dL (ref 0.0–40.0)

## 2022-09-04 LAB — TSH: TSH: 2.85 u[IU]/mL (ref 0.35–5.50)

## 2022-09-09 ENCOUNTER — Ambulatory Visit (INDEPENDENT_AMBULATORY_CARE_PROVIDER_SITE_OTHER): Payer: Medicare Other | Admitting: Internal Medicine

## 2022-09-09 ENCOUNTER — Encounter: Payer: Self-pay | Admitting: Internal Medicine

## 2022-09-09 ENCOUNTER — Ambulatory Visit: Payer: Medicare Other | Admitting: Internal Medicine

## 2022-09-09 VITALS — BP 126/72 | HR 78 | Temp 98.3°F | Resp 16 | Ht 63.0 in | Wt 141.2 lb

## 2022-09-09 DIAGNOSIS — I4891 Unspecified atrial fibrillation: Secondary | ICD-10-CM

## 2022-09-09 DIAGNOSIS — D692 Other nonthrombocytopenic purpura: Secondary | ICD-10-CM

## 2022-09-09 DIAGNOSIS — Z1231 Encounter for screening mammogram for malignant neoplasm of breast: Secondary | ICD-10-CM

## 2022-09-09 DIAGNOSIS — E7800 Pure hypercholesterolemia, unspecified: Secondary | ICD-10-CM

## 2022-09-09 DIAGNOSIS — I712 Thoracic aortic aneurysm, without rupture, unspecified: Secondary | ICD-10-CM

## 2022-09-09 DIAGNOSIS — Z86010 Personal history of colonic polyps: Secondary | ICD-10-CM | POA: Diagnosis not present

## 2022-09-09 DIAGNOSIS — I10 Essential (primary) hypertension: Secondary | ICD-10-CM

## 2022-09-09 DIAGNOSIS — R7989 Other specified abnormal findings of blood chemistry: Secondary | ICD-10-CM | POA: Diagnosis not present

## 2022-09-09 DIAGNOSIS — H938X2 Other specified disorders of left ear: Secondary | ICD-10-CM

## 2022-09-09 DIAGNOSIS — G4730 Sleep apnea, unspecified: Secondary | ICD-10-CM | POA: Diagnosis not present

## 2022-09-09 DIAGNOSIS — K219 Gastro-esophageal reflux disease without esophagitis: Secondary | ICD-10-CM | POA: Diagnosis not present

## 2022-09-09 DIAGNOSIS — D649 Anemia, unspecified: Secondary | ICD-10-CM

## 2022-09-09 DIAGNOSIS — N1832 Chronic kidney disease, stage 3b: Secondary | ICD-10-CM

## 2022-09-09 MED ORDER — DEBROX 6.5 % OT SOLN: OTIC | 0 refills | Status: DC

## 2022-09-09 NOTE — Patient Instructions (Signed)
YOUR MAMMOGRAM IS DUE, PLEASE CALL AND GET THIS SCHEDULED!   A mammogram has been ordered for you.  Please call the below facility to schedule ::  [facility] - call [phone]

## 2022-09-09 NOTE — Progress Notes (Signed)
Patient ID: Leah Huber, female   DOB: 18-May-1941, 82 y.o.   MRN: [id number]   Subjective:    Patient ID: Leah Huber, female    DOB: April 15, 1941, 82 y.o.   MRN: [id number]   Patient here for  Chief Complaint  Patient presents with   Medical Management of Chronic Issues   Hyperlipidemia   .   HPI Here to follow up regarding her cholesterol and her blood pressure.  Reports she is doing relatively well.  No chest pain or sob reported.  No abdominal pain or bowel issues reported.  Discussed recent labs.  Discussed cholesterol.  Seeing hematology - f/u anemia.  Previously received IV venofer.  Saw nephrology 07/29/22 - f/u CKD - stable.  Persistent issues with rash.  On Dupixant.  Seeing dermatology.  Reports left ear feels plugged.  No pain.  No earache.  No headache or increased sinus pressure.  No increased cough or congestion.  No chest pain or sob reported.  Has been seeing Dr Nehemiah Massed.  He moved.  Request to establish care with Dr Saralyn Pilar.     Past Medical History:  Diagnosis Date   Abnormal liver function test 07/23/2019   Anemia    Aortic aneurysm (HCC)    "SMALL"   Arthritis    neck to tailbone   Bilateral carpal tunnel syndrome 12/08/2017   Bronchitis    Chronic cough    @ NIGHT/ SEASONAL ALLERGIES   Clostridium difficile infection 2013-2014   Colon polyp    Concussion    Coronary artery disease    Dr Nehemiah Massed cardiologist   Depression    Diverticulosis    GERD (gastroesophageal reflux disease)    Glaucoma    Gout    Hemorrhoids    History of chicken pox    History of colonic polyps    History of shingles    Hypercholesterolemia    Hypertension    CONTROLLED ON MEDS   Hypovolemia 12/10   acute renal failure   IBS (irritable bowel syndrome)    Neuromuscular disorder (HCC)    occasionally tingling in hands/ NUMBNESS IN RIGHT FOOT s/p back surgery   Osteoarthritis    bilateral knees   Osteopenia    Pneumonia    HX OF   Sleep apnea    very mild   sleep  study 8 yrs ago does not use CPAP   Urinary incontinence    Past Surgical History:  Procedure Laterality Date   ABDOMINAL HYSTERECTOMY  1979   ANTERIOR CERVICAL DECOMP/DISCECTOMY FUSION N/A 11/28/2014   Procedure: CERVICAL FIVE-SIX  ANTERIOR CERVICAL DECOMPRESSION/DISCECTOMY FUSION 1 LEVEL;  Surgeon: Newman Pies, MD;  Location: [facility];  Service: Neurosurgery;  Laterality: N/A;  C56 anterior cervical decompression with fusion interbody prosthesis plating and bonegraft   BACK SURGERY     X2/ L4,5 & S1/ Cervical C7?   BLADDER SURGERY  1981   SUSPENSION   BREAST REDUCTION SURGERY     CARDIOVERSION N/A 10/29/2020   Procedure: CARDIOVERSION;  Surgeon: Corey Skains, MD;  Location: [facility];  Service: Cardiovascular;  Laterality: N/A;   CARPAL TUNNEL RELEASE  2007   CATARACT EXTRACTION W/PHACO Right 12/30/2016   Procedure: CATARACT EXTRACTION PHACO AND INTRAOCULAR LENS PLACEMENT (IOC)  Right toric lens;  Surgeon: Leandrew Koyanagi, MD;  Location: [place];  Service: Ophthalmology;  Laterality: Right;  sleep apnea, does not use CPAP   CATARACT EXTRACTION W/PHACO Left 01/20/2017   Procedure: CATARACT EXTRACTION PHACO AND INTRAOCULAR  LENS PLACEMENT (IOC)  Left Toric;  Surgeon: Leandrew Koyanagi, MD;  Location: [place];  Service: Ophthalmology;  Laterality: Left;  toric lens   CHOLECYSTECTOMY  1996   COLONOSCOPY     COLONOSCOPY WITH PROPOFOL N/A 05/22/2019   Procedure: COLONOSCOPY WITH PROPOFOL;  Surgeon: Toledo, Benay Pike, MD;  Location: [facility];  Service: Gastroenterology;  Laterality: N/A;   FOOT SURGERY  2005   JOINT REPLACEMENT Bilateral    KNEE   [place]user Bilateral 2001   ROTATOR CUFF REPAIR  2000 & 2004   left and right   SPINAL CORD STIMULATOR IMPLANT  03/1997   SPINAL CORD STIMULATOR REMOVAL  2007   Tendon repair right shoulder     TONSILLECTOMY     TOTAL KNEE ARTHROPLASTY  10/2011 & 09/13   left and  right   Family History  Problem Relation Age of Onset   Heart disease Mother    Stroke Mother    Hypertension Mother    Hyperlipidemia Father    Heart disease Father    Heart disease Brother        2 brothers   Alzheimer's disease Brother    Heart disease Sister        pacemaker   Breast cancer Daughter 62   Social History   Socioeconomic History   Marital status: Widowed    Spouse name: Not on file   Number of children: 2   Years of education: Not on file   Highest education level: Not on file  Occupational History   Not on file  Tobacco Use   Smoking status: Former    Packs/day: 1.00    Years: 20.00    Total pack years: 20.00    Types: Cigarettes    Quit date: 09/07/1988    Years since quitting: 34.0   Smokeless tobacco: Never  Vaping Use   Vaping Use: Never used  Substance and Sexual Activity   Alcohol use: Yes    Alcohol/week: 2.0 standard drinks of alcohol    Types: 2 Glasses of wine per week    Comment: occasional   Drug use: No   Sexual activity: Never  Other Topics Concern   Not on file  Social History Narrative   She is widowed, has two daughters. [place]; quit smoking- in 1990s; social alcohol.    Social Determinants of Health   Financial Resource Strain: Low Risk  (08/27/2022)   Overall Financial Resource Strain (CARDIA)    Difficulty of Paying Living Expenses: Not hard at all  Food Insecurity: No Food Insecurity (08/27/2022)   Hunger Vital Sign    Worried About Running Out of Food in the Last Year: Never true    Ran Out of Food in the Last Year: Never true  Transportation Needs: No Transportation Needs (08/24/2022)   PRAPARE - Hydrologist (Medical): No    Lack of Transportation (Non-Medical): No  Physical Activity: Insufficiently Active (08/27/2022)   Exercise Vital Sign    Days of Exercise per Week: 3 days    Minutes of Exercise per Session: 30 min  Stress: No Stress Concern Present (08/27/2022)   [place]    Feeling of Stress : Only a little  Social Connections: Unknown (08/27/2022)   Social Connection and Isolation Panel [NHANES]    Frequency of Communication with Friends and Family: More than three times a week  Frequency of Social Gatherings with Friends and Family: Three times a week    Attends Religious Services: Not on file    Active Member of Clubs or Organizations: Yes    Attends Club or Organization Meetings: More than 4 times per year    Marital Status: Widowed     Review of Systems  Constitutional:  Negative for appetite change and unexpected weight change.  HENT:  Negative for congestion and sinus pressure.        Left ear feels plugged.    Respiratory:  Negative for cough, chest tightness and shortness of breath.   Cardiovascular:  Negative for chest pain, palpitations and leg swelling.  Gastrointestinal:  Negative for abdominal pain, diarrhea, nausea and vomiting.  Genitourinary:  Negative for difficulty urinating and dysuria.  Musculoskeletal:  Negative for joint swelling and myalgias.  Skin:  Negative for color change and rash.  Neurological:  Negative for dizziness, light-headedness and headaches.  Psychiatric/Behavioral:  Negative for agitation and dysphoric mood.        Objective:     BP 126/72 (BP Location: Left Arm, Patient Position: Sitting, Cuff Size: Large)   Pulse 78   Temp 98.3 F (36.8 C) (Temporal)   Resp 16   Ht '5\' 3"'$  (1.6 m)   Wt 141 lb 3.2 oz (64 kg)   LMP 08/15/1978   SpO2 99%   BMI 25.01 kg/m  Wt Readings from Last 3 Encounters:  09/09/22 141 lb 3.2 oz (64 kg)  08/27/22 141 lb (64 kg)  06/08/22 142 lb (64.4 kg)    Physical Exam Vitals reviewed.  Constitutional:      General: She is not in acute distress.    Appearance: Normal appearance.  HENT:     Head: Normocephalic and atraumatic.     Right Ear: External ear normal.     Left Ear: External ear normal.      Ears:     Comments: Cerumen - left ear.  Right ear - clear.     Mouth/Throat:     Pharynx: Oropharynx is clear. No oropharyngeal exudate or posterior oropharyngeal erythema.  Eyes:     General: No scleral icterus.       Right eye: No discharge.        Left eye: No discharge.     Conjunctiva/sclera: Conjunctivae normal.  Neck:     Thyroid: No thyromegaly.  Cardiovascular:     Rate and Rhythm: Normal rate and regular rhythm.  Pulmonary:     Effort: No respiratory distress.     Breath sounds: Normal breath sounds. No wheezing.  Abdominal:     General: Bowel sounds are normal.     Palpations: Abdomen is soft.     Tenderness: There is no abdominal tenderness.  Musculoskeletal:        General: No swelling or tenderness.     Cervical back: Neck supple. No tenderness.  Lymphadenopathy:     Cervical: No cervical adenopathy.  Skin:    Findings: No erythema or rash.  Neurological:     Mental Status: She is alert.  Psychiatric:        Mood and Affect: Mood normal.        Behavior: Behavior normal.      Outpatient Encounter Medications as of 09/09/2022  Medication Sig   carbamide peroxide (DEBROX) 6.5 % OTIC solution 4-5 drops in left ear.  Massage for approximately 5 minutes daily.   cyanocobalamin 1000 MCG tablet Take 1,000 mcg by mouth.  DETROL LA 4 MG 24 hr capsule TAKE 1 CAPSULE DAILY   dupilumab (DUPIXENT) 300 MG/2ML prefilled syringe Inject 300 mg into the skin every 14 (fourteen) days. Starting at day 15 for maintenance.   ELIQUIS 2.5 MG TABS tablet Take 2.5 mg by mouth 2 (two) times daily.   latanoprost (XALATAN) 0.005 % ophthalmic solution Place 1 drop into both eyes at bedtime.    Lifitegrast (XIIDRA) 5 % SOLN Place 1 drop into both eyes daily.   losartan (COZAAR) 25 MG tablet Take 1 tablet by mouth daily.   metoprolol succinate (TOPROL-XL) 25 MG 24 hr tablet Take 25 mg by mouth in the morning and at bedtime.   pantoprazole (PROTONIX) 20 MG tablet One tablet qod    rosuvastatin (CRESTOR) 10 MG tablet Take 1 tablet (10 mg total) by mouth daily.   triamcinolone ointment (KENALOG) 0.1 % Apply 1 Application topically daily as needed. Avoid face, groin and axilla   [DISCONTINUED] clobetasol (TEMOVATE) 0.05 % external solution Apply twice daily to affected areas at least up to 4 weeks.   No facility-administered encounter medications on file as of 09/09/2022.     Lab Results  Component Value Date   WBC 5.6 06/08/2022   HGB 11.4 (L) 06/08/2022   HCT 34.3 (L) 06/08/2022   PLT 258 06/08/2022   GLUCOSE 95 09/04/2022   CHOL 291 (H) 09/04/2022   TRIG 152.0 (H) 09/04/2022   HDL 106.10 09/04/2022   LDLDIRECT 145.0 01/06/2021   LDLCALC 154 (H) 09/04/2022   ALT 10 09/04/2022   AST 19 09/04/2022   NA 141 09/04/2022   K 4.3 09/04/2022   CL 104 09/04/2022   CREATININE 1.20 09/04/2022   BUN 24 (H) 09/04/2022   CO2 26 09/04/2022   TSH 2.85 09/04/2022   INR 1.0 10/16/2013   HGBA1C 5.5 07/18/2019    MM 3D SCREEN BREAST BILATERAL  Result Date: 11/22/2021 CLINICAL DATA:  Screening. EXAM: DIGITAL SCREENING BILATERAL MAMMOGRAM WITH TOMOSYNTHESIS AND CAD TECHNIQUE: Bilateral screening digital craniocaudal and mediolateral oblique mammograms were obtained. Bilateral screening digital breast tomosynthesis was performed. The images were evaluated with computer-aided detection. COMPARISON:  Previous exam(s). ACR Breast Density Category b: There are scattered areas of fibroglandular density. FINDINGS: There are no findings suspicious for malignancy. IMPRESSION: No mammographic evidence of malignancy. A result letter of this screening mammogram will be mailed directly to the patient. RECOMMENDATION: Screening mammogram in one year. (Code:SM-B-01Y) BI-RADS CATEGORY  1: Negative. Electronically Signed   By: Ammie Ferrier M.D.   On: 11/22/2021 09:44       Assessment & Plan:   Problem List Items Addressed This Visit     A-fib ([place])    On metoprolol.  On eliquis. S/P  ablation in September. Overall appears to be stable.    Has been seeing Dr Nehemiah Massed.  He moved.  Request to establish care with Dr Saralyn Pilar.       Abnormal liver function test    Liver function tests just checked and wnl.  Follow.        Anemia    Being followed by Dr Rogue Bussing.  IV venofer prn.  Follow cbc.       Aortic aneurysm [facility])    Has been followed by cardiology.       CKD (chronic kidney disease) stage 3, GFR 30-59 ml/min (HCC)    Avoid nephrotoxins.  On losartan. Follow pressures.  Follow metabolic panel.  Recent GFR 42 - improved.  Follow.  Ear fullness, left    Feels left ear is plugged.  Exam reveals cerumen - left ear.  Debrox as directed.  No pain.  No earache.  Return for irrigation - left ear.       GERD (gastroesophageal reflux disease)   History of colonic polyps    Colonoscopy September 2020-1 tubular adenomatous polyp (hepatic flexure), diverticulosis and internal hemorrhoids.      Hypercholesterolemia    On crestor.  Low cholesterol diet and exercise.  Follow lipid panel and liver function tests.        Relevant Orders   Lipid panel   Hepatic function panel   Hypertension    Continue on losartan and metoprolol.  Blood pressure doing well.  Same medication regimen.  Follow.        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  On eliquis.       Sleep apnea   Other Visit Diagnoses     Encounter for screening mammogram for malignant neoplasm of breast    -  Primary   Relevant Orders   MM 3D SCREEN BREAST BILATERAL        Einar Pheasant, MD

## 2022-09-10 ENCOUNTER — Telehealth: Payer: Self-pay

## 2022-09-10 NOTE — Telephone Encounter (Signed)
Dr. Nicki Reaper pt is scheduled for ear irrigation on 09/14/22.  I don't see anything documented in your note about which ear.  Thank you

## 2022-09-11 NOTE — Telephone Encounter (Signed)
Left ear.

## 2022-09-13 ENCOUNTER — Encounter: Payer: Self-pay | Admitting: Internal Medicine

## 2022-09-13 ENCOUNTER — Telehealth: Payer: Self-pay | Admitting: Internal Medicine

## 2022-09-13 DIAGNOSIS — H938X2 Other specified disorders of left ear: Secondary | ICD-10-CM | POA: Insufficient documentation

## 2022-09-13 NOTE — Assessment & Plan Note (Signed)
Avoid nephrotoxins.  On losartan. Follow pressures.  Follow metabolic panel.  Recent GFR 42 - improved.  Follow.

## 2022-09-13 NOTE — Telephone Encounter (Signed)
Please call [facility] cardiology.  Leah Huber was seeing Dr Nehemiah Massed.  He is no longer at [place].  She wants to establish with Dr Saralyn Pilar.  Please schedule appt.  States due 4-01/2023.  Thanks  (should not need a new referral).

## 2022-09-13 NOTE — Assessment & Plan Note (Signed)
Feels left ear is plugged.  Exam reveals cerumen - left ear.  Debrox as directed.  No pain.  No earache.  Return for irrigation - left ear.

## 2022-09-13 NOTE — Assessment & Plan Note (Signed)
Continue on losartan and metoprolol.  Blood pressure doing well.  Same medication regimen.  Follow.

## 2022-09-13 NOTE — Assessment & Plan Note (Signed)
Has been followed by cardiology.

## 2022-09-13 NOTE — Assessment & Plan Note (Signed)
On crestor.  Low cholesterol diet and exercise.  Follow lipid panel and liver function tests.   

## 2022-09-13 NOTE — Assessment & Plan Note (Signed)
Being followed by Dr Rogue Bussing.  IV venofer prn.  Follow cbc.

## 2022-09-13 NOTE — Assessment & Plan Note (Addendum)
On metoprolol.  On eliquis. S/P ablation in September. Overall appears to be stable.    Has been seeing Dr Nehemiah Massed.  He moved.  Request to establish care with Dr Saralyn Pilar.

## 2022-09-13 NOTE — Assessment & Plan Note (Signed)
History of afib.  On eliquis.

## 2022-09-13 NOTE — Assessment & Plan Note (Signed)
Liver function tests just checked and wnl.  Follow.

## 2022-09-13 NOTE — Assessment & Plan Note (Signed)
Colonoscopy September 2020-1 tubular adenomatous polyp (hepatic flexure), diverticulosis and internal hemorrhoids.

## 2022-09-14 ENCOUNTER — Ambulatory Visit: Payer: Medicare Other

## 2022-09-16 ENCOUNTER — Other Ambulatory Visit: Payer: Self-pay | Admitting: Internal Medicine

## 2022-09-16 ENCOUNTER — Ambulatory Visit: Payer: Medicare Other | Admitting: Dermatology

## 2022-09-17 ENCOUNTER — Ambulatory Visit (INDEPENDENT_AMBULATORY_CARE_PROVIDER_SITE_OTHER): Payer: Medicare Other | Admitting: Dermatology

## 2022-09-17 DIAGNOSIS — Z79899 Other long term (current) drug therapy: Secondary | ICD-10-CM

## 2022-09-17 DIAGNOSIS — L2089 Other atopic dermatitis: Secondary | ICD-10-CM

## 2022-09-17 MED ORDER — DUPILUMAB 300 MG/2ML ~~LOC~~ SOSY
300.0000 mg | PREFILLED_SYRINGE | Freq: Once | SUBCUTANEOUS | Status: AC
  Administered 2022-09-17: 300 mg via SUBCUTANEOUS

## 2022-09-17 NOTE — Patient Instructions (Addendum)
Continue triamcinolone 0.1% ointment 1-2 times daily as needed for itch. Avoid applying to face, groin, and axilla. Use as directed. Long-term use can cause thinning of the skin. Samples of Zoryve given to patient to use once daily following triamcinolone to more severe areas.  Discontinue clobetasol/CeraVe mix.  Topical steroids (such as triamcinolone, fluocinolone, fluocinonide, mometasone, clobetasol, halobetasol, betamethasone, hydrocortisone) can cause thinning and lightening of the skin if they are used for too long in the same area. Your physician has selected the right strength medicine for your problem and area affected on the body. Please use your medication only as directed by your physician to prevent side effects.    Due to recent changes in healthcare laws, you may see results of your pathology and/or laboratory studies on MyChart before the doctors have had a chance to review them. We understand that in some cases there may be results that are confusing or concerning to you. Please understand that not all results are received at the same time and often the doctors may need to interpret multiple results in order to provide you with the best plan of care or course of treatment. Therefore, we ask that you please give [place] 2 business days to thoroughly review all your results before contacting the office for clarification. Should we see a critical lab result, you will be contacted sooner.   If You Need Anything After Your Visit  If you have any questions or concerns for your doctor, please call our main line at [phone] and press option 4 to reach your doctor's medical assistant. If no one answers, please leave a voicemail as directed and we will return your call as soon as possible. Messages left after 4 pm will be answered the following business day.   You may also send [place] a message via [place]. We typically respond to MyChart messages within 1-2 business days.  For prescription  refills, please ask your pharmacy to contact our office. Our fax number is [phone].  If you have an urgent issue when the clinic is closed that cannot wait until the next business day, you can page your doctor at the number below.    Please note that while we do our best to be available for urgent issues outside of office hours, we are not available 24/7.   If you have an urgent issue and are unable to reach [place], you may choose to seek medical care at your doctor's office, retail clinic, urgent care center, or emergency room.  If you have a medical emergency, please immediately call 911 or go to the emergency department.  Pager Numbers  - Dr. Nehemiah Massed: [phone]  - Dr. Laurence Ferrari: [phone]  - Dr. Nicole Kindred: [phone]  In the event of inclement weather, please call our main line at [phone] for an update on the status of any delays or closures.  Dermatology Medication Tips: Please keep the boxes that topical medications come in in order to help keep track of the instructions about where and how to use these. Pharmacies typically print the medication instructions only on the boxes and not directly on the medication tubes.   If your medication is too expensive, please contact our office at [phone] option 4 or send [place] a message through [place].   We are unable to tell what your co-pay for medications will be in advance as this is different depending on your insurance coverage. However, we may be able to find a substitute medication at lower cost or fill out paperwork  to get insurance to cover a needed medication.   If a prior authorization is required to get your medication covered by your insurance company, please allow [place] 1-2 business days to complete this process.  Drug prices often vary depending on where the prescription is filled and some pharmacies may offer cheaper prices.  The website www.goodrx.com contains coupons for medications through different pharmacies. The  prices here do not account for what the cost may be with help from insurance (it may be cheaper with your insurance), but the website can give you the price if you did not use any insurance.  - You can print the associated coupon and take it with your prescription to the pharmacy.  - You may also stop by our office during regular business hours and pick up a GoodRx coupon card.  - If you need your prescription sent electronically to a different pharmacy, notify our office through [facility] or by phone at [phone] option 4.     Si Usted Necesita Algo Despus de Su Visita  Tambin puede enviarnos un mensaje a travs de Pharmacist, community. Por lo general respondemos a los mensajes de MyChart en el transcurso de 1 a 2 das hbiles.  Para renovar recetas, por favor pida a su farmacia que se ponga en contacto con nuestra oficina. Harland Dingwall de fax es [place] [phone].  Si tiene un asunto urgente cuando la clnica est cerrada y que no puede esperar hasta el siguiente da hbil, puede llamar/localizar a su doctor(a) al nmero que aparece a continuacin.   Por favor, tenga en cuenta que aunque hacemos todo lo posible para estar disponibles para asuntos urgentes fuera del horario de [place], no estamos disponibles las 24 horas del da, los 7 das de la [place].   Si tiene un problema urgente y no puede comunicarse con nosotros, puede optar por buscar atencin mdica  en el consultorio de su doctor(a), en una clnica privada, en un centro de atencin urgente o en una sala de emergencias.  Si tiene Engineering geologist, por favor llame inmediatamente al 911 o vaya a la sala de emergencias.  Nmeros de bper  - Dr. Nehemiah Massed: [phone]  - Dra. Moye: [phone]  - Dra. Nicole Kindred: [phone]  En caso de inclemencias del [place], por favor llame a Johnsie Kindred principal al [phone] para una actualizacin sobre el [place] de cualquier retraso o cierre.  Consejos para la medicacin en  dermatologa: Por favor, guarde las cajas en las que vienen los medicamentos de uso tpico para ayudarle a seguir las instrucciones sobre dnde y cmo usarlos. Las farmacias generalmente imprimen las instrucciones del medicamento slo en las cajas y no directamente en los tubos del [place].   Si su medicamento es muy caro, por favor, pngase en contacto con Zigmund Daniel llamando al [phone] y presione la opcin 4 o envenos un mensaje a travs de Pharmacist, community.   No podemos decirle cul ser su copago por los medicamentos por adelantado ya que esto es diferente dependiendo de la cobertura de su seguro. Sin embargo, es posible que podamos encontrar un medicamento sustituto a Electrical engineer un formulario para que el seguro cubra el medicamento que se considera necesario.   Si se requiere una autorizacin previa para que su compaa de seguros [place] su medicamento, por favor permtanos de 1 a 2 das hbiles para completar este proceso.  Los precios de los medicamentos varan con frecuencia dependiendo del Environmental consultant de dnde se surte la receta y [place]  pueden ofrecer precios ms baratos.  El sitio web www.goodrx.com tiene cupones para medicamentos de Airline pilot. Los precios aqu no tienen en cuenta lo que podra costar con la ayuda del seguro (puede ser ms barato con su seguro), pero el sitio web puede darle el precio si no utiliz Research scientist (physical sciences).  - Puede imprimir el cupn correspondiente y llevarlo con su receta a la farmacia.  - Tambin puede pasar por nuestra oficina durante el horario de atencin regular y Charity fundraiser una tarjeta de cupones de GoodRx.  - Si necesita que su receta se enve electrnicamente a una farmacia diferente, informe a nuestra oficina a travs de MyChart de [place] o por telfono llamando al [phone] y presione la opcin 4.

## 2022-09-17 NOTE — Progress Notes (Signed)
   Follow-Up Visit   Subjective  Leah Huber is a 82 y.o. female who presents for the following: Dermatitis (Patient currently on [place] and using clobetasol/CeraVe. Patient advises itching has improved since going back on Dupixent, still some itching at back.).  Patient was off of [place] for about 1 year, restarted early October 2023. She does have some itching at back but not as bad as it was.   The following portions of the chart were reviewed this encounter and updated as appropriate:   Tobacco  Allergies  Meds  Problems  Med Hx  Surg Hx  Fam Hx      Review of Systems:  No other skin or systemic complaints except as noted in HPI or Assessment and Plan.  Objective  Well appearing patient in no apparent distress; mood and affect are within normal limits.  A focused examination was performed including back, arms, legs. Relevant physical exam findings are noted in the Assessment and Plan.  legs, back, arms Scaly pink papules coalescing to plaques Multiple excoriations     Assessment & Plan  Other atopic dermatitis legs, back, arms  Chronic and persistent condition with duration or expected duration over one year. Condition is symptomatic/ bothersome to patient. Not currently at goal.  Atopic dermatitis - Severe, on Dupixent (biologic medication).  Atopic dermatitis (eczema) is a chronic, relapsing, pruritic condition that can significantly affect quality of life. It is often associated with allergic rhinitis and/or asthma and can require treatment with topical medications, phototherapy, or in severe cases a biologic medication called Dupixent, which requires long term medication management.   Continue Dupixent '300mg'$ /72m SQ Q2wks Dupixent '300mg'$ /219minjected today to right upper arm  Consider switching to Adbry on follow up if still not well-controlled  Consider Opzelura to <10% BSA if not well-controlled  Restart TMC 0.1% ointment 1-2 times daily as needed for  more severe itch. Avoid applying to face, groin, and axilla. Use as directed. Long-term use can cause thinning of the skin.  Samples of Zoryve given to patient to use once daily followed by Zoryve to more severe areas.  Discontinue clobetasol/CeraVe mix.  Topical steroids (such as triamcinolone, fluocinolone, fluocinonide, mometasone, clobetasol, halobetasol, betamethasone, hydrocortisone) can cause thinning and lightening of the skin if they are used for too long in the same area. Your physician has selected the right strength medicine for your problem and area affected on the body. Please use your medication only as directed by your physician to prevent side effects.   Pt denies conjunctivitis, injection site reactions.   Related Medications dupilumab (DUPIXENT) prefilled syringe 300 mg   Encounter for long-term current use of medication [place]  Pt denies conjunctivitis, injection site reactions, cold sores.   Return in about 8 weeks (around 11/12/2022) for Dermatitis, 2 weeks with nurse.  I,Graciella BeltonRMA, am acting as scribe for VIForest GleasonMD .  Documentation: I have reviewed the above documentation for accuracy and completeness, and I agree with the above.  VIForest GleasonMD

## 2022-09-18 MED ORDER — PANTOPRAZOLE SODIUM 20 MG PO TBEC: DELAYED_RELEASE_TABLET | ORAL | 6 refills | Status: DC

## 2022-09-21 ENCOUNTER — Ambulatory Visit: Payer: Medicare Other

## 2022-09-23 ENCOUNTER — Telehealth: Payer: Self-pay | Admitting: Internal Medicine

## 2022-09-23 NOTE — Telephone Encounter (Signed)
Noted! Thank you

## 2022-09-23 NOTE — Telephone Encounter (Signed)
Pt returning call

## 2022-09-23 NOTE — Telephone Encounter (Signed)
Pt advised of appt w/ Leroy Libman at [facility]

## 2022-09-23 NOTE — Telephone Encounter (Signed)
S/w Cardiology - was advised [facility] not taking on patients. Nehemiah Massed patients are being asked to follow with [place]'s PA Leroy Libman.  There is a new Dr coming to the office, but until then she will be doing follow ups for [place] patients. Pt scheduled for 5/8 1030am with Estill Bamberg for follow up.  LM to advise pt same.

## 2022-09-28 ENCOUNTER — Encounter: Payer: Self-pay | Admitting: Dermatology

## 2022-09-28 ENCOUNTER — Other Ambulatory Visit: Payer: Self-pay | Admitting: Internal Medicine

## 2022-09-28 DIAGNOSIS — E7800 Pure hypercholesterolemia, unspecified: Secondary | ICD-10-CM

## 2022-10-01 ENCOUNTER — Ambulatory Visit (INDEPENDENT_AMBULATORY_CARE_PROVIDER_SITE_OTHER): Payer: Medicare Other

## 2022-10-01 DIAGNOSIS — L209 Atopic dermatitis, unspecified: Secondary | ICD-10-CM

## 2022-10-01 MED ORDER — DUPILUMAB 300 MG/2ML ~~LOC~~ SOSY
300.0000 mg | PREFILLED_SYRINGE | Freq: Once | SUBCUTANEOUS | Status: AC
  Administered 2022-10-01: 300 mg via SUBCUTANEOUS

## 2022-10-01 NOTE — Progress Notes (Signed)
Patient here today for 2 week injection for Severe Atopic Dermatitis.    Patient injected today with Dupixent '300mg'$  into the Left Upper Arm. Patient tolerated procedure well.    Lot:[id number] Exp:05/07/2024 YCX:[id number]   [place]

## 2022-10-05 ENCOUNTER — Other Ambulatory Visit: Payer: Self-pay | Admitting: *Deleted

## 2022-10-05 DIAGNOSIS — D539 Nutritional anemia, unspecified: Secondary | ICD-10-CM

## 2022-10-06 ENCOUNTER — Inpatient Hospital Stay: Payer: Medicare Other | Attending: Internal Medicine

## 2022-10-06 DIAGNOSIS — D539 Nutritional anemia, unspecified: Secondary | ICD-10-CM

## 2022-10-06 DIAGNOSIS — D649 Anemia, unspecified: Secondary | ICD-10-CM | POA: Diagnosis not present

## 2022-10-06 LAB — CBC WITH DIFFERENTIAL/PLATELET
Abs Immature Granulocytes: 0.02 10*3/uL (ref 0.00–0.07)
Basophils Absolute: 0.1 10*3/uL (ref 0.0–0.1)
Basophils Relative: 1 %
Eosinophils Absolute: 0.1 10*3/uL (ref 0.0–0.5)
Eosinophils Relative: 1 %
HCT: 36 % (ref 36.0–46.0)
Hemoglobin: 11.6 g/dL — ABNORMAL LOW (ref 12.0–15.0)
Immature Granulocytes: 0 %
Lymphocytes Relative: 34 %
Lymphs Abs: 1.7 10*3/uL (ref 0.7–4.0)
MCH: 34.2 pg — ABNORMAL HIGH (ref 26.0–34.0)
MCHC: 32.2 g/dL (ref 30.0–36.0)
MCV: 106.2 fL — ABNORMAL HIGH (ref 80.0–100.0)
Monocytes Absolute: 0.5 10*3/uL (ref 0.1–1.0)
Monocytes Relative: 10 %
Neutro Abs: 2.7 10*3/uL (ref 1.7–7.7)
Neutrophils Relative %: 54 %
Platelets: 255 10*3/uL (ref 150–400)
RBC: 3.39 MIL/uL — ABNORMAL LOW (ref 3.87–5.11)
RDW: 12.6 % (ref 11.5–15.5)
WBC: 5 10*3/uL (ref 4.0–10.5)
nRBC: 0 % (ref 0.0–0.2)

## 2022-10-06 LAB — IRON AND TIBC
Iron: 107 ug/dL (ref 28–170)
Saturation Ratios: 30 % (ref 10.4–31.8)
TIBC: 360 ug/dL (ref 250–450)
UIBC: 253 ug/dL

## 2022-10-06 LAB — FOLATE: Folate: 11.2 ng/mL (ref >5.9)

## 2022-10-06 LAB — VITAMIN B12: Vitamin B-12: 1090 pg/mL — ABNORMAL HIGH (ref 180–914)

## 2022-10-06 LAB — FERRITIN: Ferritin: 73 ng/mL (ref 11–307)

## 2022-10-07 ENCOUNTER — Telehealth: Payer: Self-pay

## 2022-10-07 NOTE — Telephone Encounter (Signed)
Prescription Request  10/07/2022  Is this a "Controlled Substance" medicine? No  LOV: Visit date not found  What is the name of the medication or equipment? pantoprazole (PROTONIX) 20 MG tablet  Have you contacted your pharmacy to request a refill? No   Which pharmacy would you like this sent to?  [place], [place] [address] Phone: [phone] Fax: [phone]    Patient notified that their request is being sent to the clinical staff for review and that they should receive a response within 2 business days.   Please advise at Mobile [phone] (mobile)   Patient states she would like to be sure we are sending the prescription for 20 MG tablets and she would like to have a 90-day supply.  Patient states she has enough medication for next week, but would like for [place] to send it in as soon as possible.

## 2022-10-08 ENCOUNTER — Other Ambulatory Visit: Payer: Self-pay

## 2022-10-08 MED ORDER — PANTOPRAZOLE SODIUM 20 MG PO TBEC: DELAYED_RELEASE_TABLET | ORAL | 6 refills | Status: DC

## 2022-10-08 MED FILL — Iron Sucrose Inj 20 MG/ML (Fe Equiv): INTRAVENOUS | Qty: 10 | Status: AC

## 2022-10-08 NOTE — Telephone Encounter (Signed)
sent 

## 2022-10-09 ENCOUNTER — Inpatient Hospital Stay: Payer: Medicare Other | Attending: Internal Medicine

## 2022-10-09 ENCOUNTER — Inpatient Hospital Stay (HOSPITAL_BASED_OUTPATIENT_CLINIC_OR_DEPARTMENT_OTHER): Payer: Medicare Other | Admitting: Internal Medicine

## 2022-10-09 ENCOUNTER — Encounter: Payer: Self-pay | Admitting: Internal Medicine

## 2022-10-09 DIAGNOSIS — Z7901 Long term (current) use of anticoagulants: Secondary | ICD-10-CM | POA: Diagnosis not present

## 2022-10-09 DIAGNOSIS — I129 Hypertensive chronic kidney disease with stage 1 through stage 4 chronic kidney disease, or unspecified chronic kidney disease: Secondary | ICD-10-CM | POA: Diagnosis not present

## 2022-10-09 DIAGNOSIS — D509 Iron deficiency anemia, unspecified: Secondary | ICD-10-CM | POA: Diagnosis not present

## 2022-10-09 DIAGNOSIS — N183 Chronic kidney disease, stage 3 unspecified: Secondary | ICD-10-CM | POA: Diagnosis not present

## 2022-10-09 DIAGNOSIS — E611 Iron deficiency: Secondary | ICD-10-CM | POA: Diagnosis not present

## 2022-10-09 NOTE — Progress Notes (Signed)
Leah Huber NOTE  Patient Care Team: Einar Pheasant, MD as PCP - General (Internal Medicine) Efrain Sella, MD as Consulting Physician (Gastroenterology) Cammie Sickle, MD as Consulting Physician (Internal Medicine)  CHIEF COMPLAINTS/PURPOSE OF CONSULTATION: ANEMIA  HEMATOLOGY HISTORY:  # AUG 2022:  SEVERE ANEMIA [hb-7; ]  EGD/Colonoscopy:2020- last colo-69m [[facility] Dr.Toledo]; Esophagogram- 2022- NEG for stricture. ? capsule  # CKD stage III [Dr.Korrapti]; A.fib [on eliquis on 2.5 mg BID;  Dr.Kowalski]    HISTORY OF PRESENTING ILLNESS: Alone. Ambulating independently.  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   Patient denies new problems/concerns today   Patient had constipation needing enema after taking gentle iron.  She is currently off any oral  iron pills.   Patient admits to improved energy levels.  She is not on oral iron.  Denies any blood in stools or black-colored stools.   Review of Systems  Constitutional:  Positive for malaise/fatigue. Negative for chills, diaphoresis, fever and weight loss.  HENT:  Negative for nosebleeds and sore throat.   Eyes:  Negative for double vision.  Respiratory:  Negative for cough, hemoptysis, sputum production and wheezing.   Cardiovascular:  Negative for chest pain, palpitations, orthopnea and leg swelling.  Gastrointestinal:  Positive for constipation. Negative for blood in stool, heartburn, melena and vomiting.  Genitourinary:  Negative for dysuria, frequency and urgency.  Musculoskeletal:  Positive for back pain and joint pain.  Skin: Negative.  Negative for itching and rash.  Neurological:  Negative for tingling, focal weakness, weakness and headaches.  Endo/Heme/Allergies:  Does not bruise/bleed easily.  Psychiatric/Behavioral:  Negative for depression. The patient is not nervous/anxious and does not have insomnia.     MEDICAL HISTORY:   Past Medical History:  Diagnosis Date   Abnormal liver function test 07/23/2019   Anemia    Aortic aneurysm (HCC)    "SMALL"   Arthritis    neck to tailbone   Bilateral carpal tunnel syndrome 12/08/2017   Bronchitis    Chronic cough    @ NIGHT/ SEASONAL ALLERGIES   Clostridium difficile infection 2013-2014   Colon polyp    Concussion    Coronary artery disease    Dr KNehemiah Massedcardiologist   Depression    Diverticulosis    GERD (gastroesophageal reflux disease)    Glaucoma    Gout    Hemorrhoids    History of chicken pox    History of colonic polyps    History of shingles    Hypercholesterolemia    Hypertension    CONTROLLED ON MEDS   Hypovolemia 12/10   acute renal failure   IBS (irritable bowel syndrome)    Neuromuscular disorder (HCC)    occasionally tingling in hands/ NUMBNESS IN RIGHT FOOT s/p back surgery   Osteoarthritis    bilateral knees   Osteopenia    Pneumonia    HX OF   Sleep apnea    very mild   sleep study 8 yrs ago does not use CPAP   Urinary incontinence     SURGICAL HISTORY: Past Surgical History:  Procedure Laterality Date   ABDOMINAL HYSTERECTOMY  1979   ANTERIOR CERVICAL DECOMP/DISCECTOMY FUSION N/A 11/28/2014   Procedure: CERVICAL FIVE-SIX  ANTERIOR CERVICAL DECOMPRESSION/DISCECTOMY FUSION 1 LEVEL;  Surgeon: JNewman Pies MD;  [facility];  Service: Neurosurgery;  Laterality: N/A;  C56 anterior cervical decompression with fusion interbody prosthesis plating and bonegraft   BACK SURGERY  X2/ L4,5 & S1/ Cervical C7?   BLADDER SURGERY  1981   SUSPENSION   BREAST REDUCTION SURGERY     CARDIOVERSION N/A 10/29/2020   Procedure: CARDIOVERSION;  Surgeon: Corey Skains, MD;  Location: [facility];  Service: Cardiovascular;  Laterality: N/A;   CARPAL TUNNEL RELEASE  2007   CATARACT EXTRACTION W/PHACO Right 12/30/2016   Procedure: CATARACT EXTRACTION PHACO AND INTRAOCULAR LENS PLACEMENT (IOC)  Right toric lens;  Surgeon: Leandrew Koyanagi, MD;  Location: [facility];  Service: Ophthalmology;  Laterality: Right;  sleep apnea, does not use CPAP   CATARACT EXTRACTION W/PHACO Left 01/20/2017   Procedure: CATARACT EXTRACTION PHACO AND INTRAOCULAR LENS PLACEMENT ([place])  Left Toric;  Surgeon: Leandrew Koyanagi, MD;  Location: [facility];  Service: Ophthalmology;  Laterality: Left;  toric lens   CHOLECYSTECTOMY  1996   COLONOSCOPY     COLONOSCOPY WITH PROPOFOL N/A 05/22/2019   Procedure: COLONOSCOPY WITH PROPOFOL;  Surgeon: Toledo, Benay Pike, MD;  Location: [facility];  Service: Gastroenterology;  Laterality: N/A;   FOOT SURGERY  2005   JOINT REPLACEMENT Bilateral    KNEE   [place] Bilateral 2001   ROTATOR CUFF REPAIR  2000 & 2004   left and right   SPINAL CORD STIMULATOR IMPLANT  03/1997   SPINAL CORD STIMULATOR REMOVAL  2007   Tendon repair right shoulder     TONSILLECTOMY     TOTAL KNEE ARTHROPLASTY  10/2011 & 09/13   left and right    SOCIAL HISTORY: Social History   Socioeconomic History   Marital status: Widowed    Spouse name: Not on file   Number of children: 2   Years of education: Not on file   Highest education level: Not on file  Occupational History   Not on file  Tobacco Use   Smoking status: Former    Packs/day: 1.00    Years: 20.00    Total pack years: 20.00    Types: Cigarettes    Quit date: 09/07/1988    Years since quitting: 34.1   Smokeless tobacco: Never  Vaping Use   Vaping Use: Never used  Substance and Sexual Activity   Alcohol use: Yes    Alcohol/week: 2.0 standard drinks of alcohol    Types: 2 Glasses of wine per week    Comment: occasional   Drug use: No   Sexual activity: Never  Other Topics Concern   Not on file  Social History Narrative   She is widowed, has two daughters. [place]; quit smoking- in 1990s; social alcohol.    Social Determinants of Health   Financial Resource Strain: Low Risk   (08/27/2022)   Overall Financial Resource Strain (CARDIA)    Difficulty of Paying Living Expenses: Not hard at all  Food Insecurity: No Food Insecurity (08/27/2022)   Hunger Vital Sign    Worried About Running Out of Food in the Last Year: Never true    Ran Out of Food in the Last Year: Never true  Transportation Needs: No Transportation Needs (08/24/2022)   PRAPARE - Hydrologist (Medical): No    Lack of Transportation (Non-Medical): No  Physical Activity: Insufficiently Active (08/27/2022)   Exercise Vital Sign    Days of Exercise per Week: 3 days    Minutes of Exercise per Session: 30 min  Stress: No Stress Concern Present (08/27/2022)   [place]  Feeling of Stress : Only a little  Social Connections: Unknown (08/27/2022)   Social Connection and Isolation Panel [NHANES]    Frequency of Communication with Friends and Family: More than three times a week    Frequency of Social Gatherings with Friends and Family: Three times a week    Attends Religious Services: Not on file    Active Member of Clubs or Organizations: Yes    Attends Club or Organization Meetings: More than 4 times per year    Marital Status: Widowed  Intimate Partner Violence: Not At Risk (08/27/2022)   Humiliation, Afraid, Rape, and Kick questionnaire    Fear of Current or Ex-Partner: No    Emotionally Abused: No    Physically Abused: No    Sexually Abused: No    FAMILY HISTORY: Family History  Problem Relation Age of Onset   Heart disease Mother    Stroke Mother    Hypertension Mother    Hyperlipidemia Father    Heart disease Father    Heart disease Brother        2 brothers   Alzheimer's disease Brother    Heart disease Sister        pacemaker   Breast cancer Daughter 42    ALLERGIES:  is allergic to clindamycin/lincomycin, dronedarone, bactrim [sulfamethoxazole-trimethoprim], morphine, morphine and  related, and sulfa antibiotics.  MEDICATIONS:  Current Outpatient Medications  Medication Sig Dispense Refill   carbamide peroxide (DEBROX) 6.5 % OTIC solution 4-5 drops in left ear.  Massage for approximately 5 minutes daily. 15 mL 0   cyanocobalamin 1000 MCG tablet Take 1,000 mcg by mouth.     DETROL LA 4 MG 24 hr capsule TAKE 1 CAPSULE DAILY 90 capsule 3   dupilumab (DUPIXENT) 300 MG/2ML prefilled syringe Inject 300 mg into the skin every 14 (fourteen) days. Starting at day 15 for maintenance. 8 mL 2   ELIQUIS 2.5 MG TABS tablet Take 2.5 mg by mouth 2 (two) times daily.     latanoprost (XALATAN) 0.005 % ophthalmic solution Place 1 drop into both eyes at bedtime.      Lifitegrast (XIIDRA) 5 % SOLN Place 1 drop into both eyes daily.     losartan (COZAAR) 25 MG tablet 25 mg daily.     metoprolol succinate (TOPROL-XL) 25 MG 24 hr tablet Take 25 mg by mouth in the morning and at bedtime.     pantoprazole (PROTONIX) 20 MG tablet One tablet qod 30 tablet 6   rosuvastatin (CRESTOR) 10 MG tablet TAKE 1 TABLET DAILY 90 tablet 3   triamcinolone ointment (KENALOG) 0.1 % Apply 1 Application topically daily as needed. Avoid face, groin and axilla 80 g 0   No current facility-administered medications for this visit.      PHYSICAL EXAMINATION:   Vitals:   10/09/22 1300  BP: 138/82  Pulse: 71  Resp: 16  Temp: (!) 96.8 F (36 C)   Filed Weights   10/09/22 1300  Weight: 144 lb 11.2 oz (65.6 kg)    Physical Exam Vitals and nursing note reviewed.  HENT:     Head: Normocephalic and atraumatic.     Mouth/Throat:     Pharynx: Oropharynx is clear.  Eyes:     Extraocular Movements: Extraocular movements intact.     Pupils: Pupils are equal, round, and reactive to light.  Cardiovascular:     Rate and Rhythm: Normal rate and regular rhythm.  Pulmonary:     Comments: Decreased breath sounds bilaterally.  Abdominal:  Palpations: Abdomen is soft.  Musculoskeletal:        General: Normal  range of motion.     Cervical back: Normal range of motion.  Skin:    General: Skin is warm.  Neurological:     General: No focal deficit present.     Mental Status: She is alert and oriented to person, place, and time.  Psychiatric:        Behavior: Behavior normal.        Judgment: Judgment normal.    LABORATORY DATA:  I have reviewed the data as listed Lab Results  Component Value Date   WBC 5.0 10/06/2022   HGB 11.6 (L) 10/06/2022   HCT 36.0 10/06/2022   MCV 106.2 (H) 10/06/2022   PLT 255 10/06/2022   Recent Labs    01/13/22 1246 04/28/22 0924 06/08/22 1406 09/04/22 0851  NA 135 137 139 141  K 4.2 5.1 4.8 4.3  CL 104 103 109 104  CO2 '23 23 25 26  '$ GLUCOSE 92 91 105* 95  BUN 23 29* 29* 24*  CREATININE 1.18* 1.47* 1.39* 1.20  CALCIUM 9.1 9.7 9.5 9.7  GFRNONAA 46*  --  38*  --   PROT  --  6.8  --  6.8  ALBUMIN  --  4.6  --  4.6  AST  --  19  --  19  ALT  --  10  --  10  ALKPHOS  --  53  --  51  BILITOT  --  0.6  --  0.5  BILIDIR  --  0.1  --  0.1     No results found.  Iron deficiency #Anemia-iron deficiency; s/p IV venofer weekly x4.  Hemoglobin improved from 7 to 12.  Intolerance PO gentle iron/ constipation.  Proceed with iron infusion today.  # Macrocytosis- ? unclear etiology- B12 Sep 2022- 924; on b12 pills; hold off bone marrow biopsy.  # Etiology of iron deficiency: [Oct 2022] s/p [facility] evaluation; s/p  barium esophagogram.consider capsule  #A. fib [Dr.Thomas; [facility] ]-on Eliquis 2.5 mg twice daily- s/p ablation [[facility]-Oct 2022]-STABLE  # CKD- Stage III- GFR 46- STABLE [Dr.korrapati]  # DISPOSITION: # venofer today;  # follow up in 4 month-  MD; labs- cbc/bmp/iron studies/ferritin; B12 level- Possible venofer-Dr.B     All questions were answered. The patient knows to call the clinic with any problems, questions or concerns.      Cammie Sickle, MD 10/09/2022 1:58 PM

## 2022-10-09 NOTE — Assessment & Plan Note (Addendum)
#  Anemia-iron deficiency; - nadir July 2022- Hemoglobin improved from 7 to 12.  Intolerance PO gentle iron/ constipation.  HOLD off iron infusion today. Discussed re: dietary iron.   # Macrocytosis- ? unclear etiology- B12 Sep 2022- 924; on b12 pills;  / CKD vs others. hold off bone marrow biopsy.-  # Etiology of iron deficiency: [Oct 0131] s/p [facility] evaluation; s/p  barium esophagogram.consider capsule  #A. fib [Dr.Thomas; [facility] ]-on Eliquis 2.5 mg twice daily- s/p ablation [[facility]-Oct 2022]-stable.   # CKD- Stage III- GFR 46- stable. [Dr.korrapati]  # DISPOSITION: # HOLD venofer today;  # follow up in 6 month-  MD; labs- cbc/bmp/iron studies/ferritin; LDH;B12 level- Possible venofer-Dr.B

## 2022-10-09 NOTE — Progress Notes (Signed)
Patient denies new problems/concerns today.   

## 2022-10-14 ENCOUNTER — Ambulatory Visit (INDEPENDENT_AMBULATORY_CARE_PROVIDER_SITE_OTHER): Payer: Medicare Other

## 2022-10-14 DIAGNOSIS — L209 Atopic dermatitis, unspecified: Secondary | ICD-10-CM

## 2022-10-14 MED ORDER — DUPILUMAB 300 MG/2ML ~~LOC~~ SOSY
300.0000 mg | PREFILLED_SYRINGE | Freq: Once | SUBCUTANEOUS | Status: AC
  Administered 2022-10-14: 300 mg via SUBCUTANEOUS

## 2022-10-14 NOTE — Progress Notes (Signed)
Patient here today for 2 week injection for Severe Atopic Dermatitis.    Patient injected today with Dupixent '300mg'$  into the Left Upper Arm. Patient tolerated procedure well.   Left arm done again today due to bruising on the right side from 09/17/22 office visit.    Lot:[id number] Exp:05/07/2024 [id number]   Johnsie Kindred, RMA

## 2022-10-26 ENCOUNTER — Telehealth: Payer: Self-pay | Admitting: Internal Medicine

## 2022-10-26 ENCOUNTER — Other Ambulatory Visit: Payer: Self-pay

## 2022-10-26 MED ORDER — PANTOPRAZOLE SODIUM 20 MG PO TBEC: DELAYED_RELEASE_TABLET | ORAL | 2 refills | Status: DC

## 2022-10-26 NOTE — Telephone Encounter (Signed)
sent

## 2022-10-26 NOTE — Telephone Encounter (Signed)
Prescription Request  10/26/2022  Is this a "Controlled Substance" medicine? No  LOV: 09/09/2022  What is the name of the medication or equipment? pantoprazole (PROTONIX) 20 MG tablet  Have you contacted your pharmacy to request a refill? Yes   Which pharmacy would you like this sent to?   [place], [place] [address] Phone: [phone] Fax: [phone]     Patient notified that their request is being sent to the clinical staff for review and that they should receive a response within 2 business days.   Please advise at [place]

## 2022-10-28 ENCOUNTER — Ambulatory Visit (INDEPENDENT_AMBULATORY_CARE_PROVIDER_SITE_OTHER): Payer: Medicare Other

## 2022-10-28 DIAGNOSIS — L209 Atopic dermatitis, unspecified: Secondary | ICD-10-CM

## 2022-10-28 MED ORDER — DUPILUMAB 300 MG/2ML ~~LOC~~ SOSY
300.0000 mg | PREFILLED_SYRINGE | Freq: Once | SUBCUTANEOUS | Status: AC
  Administered 2022-10-28: 300 mg via SUBCUTANEOUS

## 2022-10-28 NOTE — Progress Notes (Signed)
Patient here today for 2 week injection for Severe Atopic Dermatitis.    Patient injected today with Dupixent 339m into the right Upper Arm. Patient tolerated procedure well.     [id number]:11/05/2024 [id number]  AJohnsie Kindred RMA

## 2022-11-02 ENCOUNTER — Other Ambulatory Visit: Payer: Self-pay

## 2022-11-02 MED ORDER — PANTOPRAZOLE SODIUM 20 MG PO TBEC: DELAYED_RELEASE_TABLET | ORAL | 2 refills | Status: DC

## 2022-11-02 NOTE — Telephone Encounter (Signed)
Resent

## 2022-11-02 NOTE — Telephone Encounter (Signed)
Patient states she checked with [facility] 11/01/2022 and they still had not received the prescription refill for pantoprazole (PROTONIX) 20 MG tablet.  Patient states she is completely out of this medication.

## 2022-11-11 ENCOUNTER — Ambulatory Visit: Payer: Medicare Other | Admitting: Dermatology

## 2022-11-16 ENCOUNTER — Other Ambulatory Visit: Payer: Self-pay | Admitting: Family

## 2022-11-16 ENCOUNTER — Ambulatory Visit (INDEPENDENT_AMBULATORY_CARE_PROVIDER_SITE_OTHER): Payer: Medicare Other

## 2022-11-16 DIAGNOSIS — L209 Atopic dermatitis, unspecified: Secondary | ICD-10-CM | POA: Diagnosis not present

## 2022-11-16 MED ORDER — DUPILUMAB 300 MG/2ML ~~LOC~~ SOSY
300.0000 mg | PREFILLED_SYRINGE | Freq: Once | SUBCUTANEOUS | Status: AC
  Administered 2022-11-16: 300 mg via SUBCUTANEOUS

## 2022-11-16 NOTE — Progress Notes (Signed)
Patient here today for 2 week injection for Severe Atopic Dermatitis.    Patient injected today with Dupixent '300mg'$  into the left Upper Arm. Patient tolerated procedure well.      [id number] Exp:11/05/2024 [id number]   Johnsie Kindred, RMA

## 2022-11-17 ENCOUNTER — Telehealth: Payer: Self-pay

## 2022-11-17 NOTE — Telephone Encounter (Signed)
I have not had any concerns and not aware of any concerns.

## 2022-11-17 NOTE — Telephone Encounter (Signed)
Leah Huber from [facility] [employer] [employer] called to let us know that patient has a case open and wanted to check and make sure there are no concerns that they should be aware of prior to closing the case.    CB# [phone]

## 2022-11-18 ENCOUNTER — Encounter: Payer: Self-pay | Admitting: Dermatology

## 2022-11-18 ENCOUNTER — Ambulatory Visit (INDEPENDENT_AMBULATORY_CARE_PROVIDER_SITE_OTHER): Payer: Medicare Other | Admitting: Dermatology

## 2022-11-18 VITALS — BP 111/63 | HR 82

## 2022-11-18 DIAGNOSIS — L209 Atopic dermatitis, unspecified: Secondary | ICD-10-CM | POA: Diagnosis not present

## 2022-11-18 MED ORDER — ADBRY 150 MG/ML ~~LOC~~ SOSY: 600.0000 mg | PREFILLED_SYRINGE | Freq: Once | SUBCUTANEOUS | 0 refills | Status: AC

## 2022-11-18 MED ORDER — ADBRY 150 MG/ML ~~LOC~~ SOSY: 300.0000 mg | PREFILLED_SYRINGE | SUBCUTANEOUS | 4 refills | Status: DC

## 2022-11-18 NOTE — Telephone Encounter (Signed)
LM for Crystal with [facility]

## 2022-11-18 NOTE — Patient Instructions (Addendum)
Will start Adbry at next injection appointment 11/30/2022  Continue Zoryve cream as directed.   Tralokinumab [place]) is a treatment given by injection for adults with moderate-to-severe atopic dermatitis. Goal is control of skin condition, not cure. It is given as 4 injections at the first dose followed by 2 injections ever 2 weeks thereafter.  Potential side effects include allergic reaction, injection site reactions and conjunctivitis (inflammation of the eyes).  The use of Adbry requires long term medication management, including periodic office visits.   Due to recent changes in healthcare laws, you may see results of your pathology and/or laboratory studies on MyChart before the doctors have had a chance to review them. We understand that in some cases there may be results that are confusing or concerning to you. Please understand that not all results are received at the same time and often the doctors may need to interpret multiple results in order to provide you with the best plan of care or course of treatment. Therefore, we ask that you please give [place] 2 business days to thoroughly review all your results before contacting the office for clarification. Should we see a critical lab result, you will be contacted sooner.   If You Need Anything After Your Visit  If you have any questions or concerns for your doctor, please call our main line at [phone] and press option 4 to reach your doctor's medical assistant. If no one answers, please leave a voicemail as directed and we will return your call as soon as possible. Messages left after 4 pm will be answered the following business day.   You may also send [place] a message via [place]. We typically respond to MyChart messages within 1-2 business days.  For prescription refills, please ask your pharmacy to contact our office. Our fax number is [phone].  If you have an urgent issue when the clinic is closed that cannot wait until the next  business day, you can page your doctor at the number below.    Please note that while we do our best to be available for urgent issues outside of office hours, we are not available 24/7.   If you have an urgent issue and are unable to reach [place], you may choose to seek medical care at your doctor's office, retail clinic, urgent care center, or emergency room.  If you have a medical emergency, please immediately call 911 or go to the emergency department.  Pager Numbers  - Dr. Nehemiah Massed: [phone]  - Dr. Laurence Ferrari: [phone]  - Dr. Nicole Kindred: [phone]  In the event of inclement weather, please call our main line at [phone] for an update on the status of any delays or closures.  Dermatology Medication Tips: Please keep the boxes that topical medications come in in order to help keep track of the instructions about where and how to use these. Pharmacies typically print the medication instructions only on the boxes and not directly on the medication tubes.   If your medication is too expensive, please contact our office at [phone] option 4 or send [place] a message through [place].   We are unable to tell what your co-pay for medications will be in advance as this is different depending on your insurance coverage. However, we may be able to find a substitute medication at lower cost or fill out paperwork to get insurance to cover a needed medication.   If a prior authorization is required to get your medication covered by your insurance company, please allow  [place] 1-2 business days to complete this process.  Drug prices often vary depending on where the prescription is filled and some pharmacies may offer cheaper prices.  The website www.goodrx.com contains coupons for medications through different pharmacies. The prices here do not account for what the cost may be with help from insurance (it may be cheaper with your insurance), but the website can give you the price if you did not use  any insurance.  - You can print the associated coupon and take it with your prescription to the pharmacy.  - You may also stop by our office during regular business hours and pick up a GoodRx coupon card.  - If you need your prescription sent electronically to a different pharmacy, notify our office through [facility] or by phone at [phone] option 4.     Si Usted Necesita Algo Despus de Su Visita  Tambin puede enviarnos un mensaje a travs de Pharmacist, community. Por lo general respondemos a los mensajes de MyChart en el transcurso de 1 a 2 das hbiles.  Para renovar recetas, por favor pida a su farmacia que se ponga en contacto con nuestra oficina. Harland Dingwall de fax es [place] [phone].  Si tiene un asunto urgente cuando la clnica est cerrada y que no puede esperar hasta el siguiente da hbil, puede llamar/localizar a su doctor(a) al nmero que aparece a continuacin.   Por favor, tenga en cuenta que aunque hacemos todo lo posible para estar disponibles para asuntos urgentes fuera del horario de [place], no estamos disponibles las 24 horas del da, los 7 das de la [place].   Si tiene un problema urgente y no puede comunicarse con nosotros, puede optar por buscar atencin mdica  en el consultorio de su doctor(a), en una clnica privada, en un centro de atencin urgente o en una sala de emergencias.  Si tiene Engineering geologist, por favor llame inmediatamente al 911 o vaya a la sala de emergencias.  Nmeros de bper  - Dr. Nehemiah Massed: [phone]  - Dra. Moye: [phone]  - Dra. Nicole Kindred: [phone]  En caso de inclemencias del [place], por favor llame a Johnsie Kindred principal al [phone] para una actualizacin sobre el [place] de cualquier retraso o cierre.  Consejos para la medicacin en dermatologa: Por favor, guarde las cajas en las que vienen los medicamentos de uso tpico para ayudarle a seguir las instrucciones sobre dnde y cmo usarlos. Las farmacias  generalmente imprimen las instrucciones del medicamento slo en las cajas y no directamente en los tubos del [place].   Si su medicamento es muy caro, por favor, pngase en contacto con Zigmund Daniel llamando al [phone] y presione la opcin 4 o envenos un mensaje a travs de Pharmacist, community.   No podemos decirle cul ser su copago por los medicamentos por adelantado ya que esto es diferente dependiendo de la cobertura de su seguro. Sin embargo, es posible que podamos encontrar un medicamento sustituto a Electrical engineer un formulario para que el seguro cubra el medicamento que se considera necesario.   Si se requiere una autorizacin previa para que su compaa de seguros [place] su medicamento, por favor permtanos de 1 a 2 das hbiles para completar este proceso.  Los precios de los medicamentos varan con frecuencia dependiendo del Environmental consultant de dnde se surte la receta y alguna farmacias pueden ofrecer precios ms baratos.  El sitio web www.goodrx.com tiene cupones para medicamentos de Airline pilot. Los precios aqu no tienen en cuenta lo que podra  costar con la ayuda del seguro (puede ser ms barato con su seguro), pero el sitio web puede darle el precio si no Field seismologist.  - Puede imprimir el cupn correspondiente y llevarlo con su receta a la farmacia.  - Tambin puede pasar por nuestra oficina durante el horario de atencin regular y Charity fundraiser una tarjeta de cupones de GoodRx.  - Si necesita que su receta se enve electrnicamente a una farmacia diferente, informe a nuestra oficina a travs de MyChart de [place] o por telfono llamando al [phone] y presione la opcin 4.

## 2022-11-18 NOTE — Progress Notes (Signed)
   Follow-Up Visit   Subjective  Leah Huber is a 82 y.o. female who presents for the following: Eczema (8 week recheck. Atopic dermatitis. On Dupixent since 08/2020. States overall condition is better but still itching quite a bit. 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 Using Triamcinolone 0.1% ointment as directed. Last Dupixent injection was 11/16/2022). Patient states back itches most.   The following portions of the chart were reviewed this encounter and updated as appropriate:  Tobacco  Allergies  Meds  Problems  Med Hx  Surg Hx  Fam Hx      Review of Systems: No other skin or systemic complaints except as noted in HPI or Assessment and Plan.   Objective  Well appearing patient in no apparent distress; mood and affect are within normal limits.  A focused examination was performed including face, arms, legs. Relevant physical exam findings are noted in the Assessment and Plan.   Assessment & Plan  Atopic dermatitis, unspecified type arms, legs, back  Chronic and persistent condition with duration or expected duration over one year. Condition is symptomatic/ bothersome to patient. Not currently at goal.   Dramatically improved on Dupixent but still with persistent itch.   Has failed clobetasol in cerave. Considered opzelura but BSA too high (>20%).  Atopic dermatitis - Severe, on Dupixent (biologic medication).  Atopic dermatitis (eczema) is a chronic, relapsing, pruritic condition that can significantly affect quality of life. It is often associated with allergic rhinitis and/or asthma and can require treatment with topical medications, phototherapy, or in severe cases a biologic medication called Dupixent, which requires long term medication management.  Will start Adbry at next injection appointment 11/30/2022  Continue Zoryve cream as directed.   Consider low dose naltrexone for itch in future  Tralokinumab-ldrm (ADBRY) 150 MG/ML SOSY - arms, legs, back Inject 4 mLs (600 mg  total) into the skin once for 1 dose. On day 1.  Tralokinumab-ldrm (ADBRY) 150 MG/ML SOSY - arms, legs, back Inject 2 mLs (300 mg total) into the skin every 14 (fourteen) days. Starting on day 15 for maintenance.  Related Medications dupilumab (DUPIXENT) 300 MG/2ML prefilled syringe Inject 300 mg into the skin every 14 (fourteen) days. Starting at day 15 for maintenance.   Return in about 3 months (around 02/18/2023) for Atopic Dermatitis.  I, Emelia Salisbury, CMA, am acting as scribe for Forest Gleason, MD.  Documentation: I have reviewed the above documentation for accuracy and completeness, and I agree with the above.  Forest Gleason, MD

## 2022-11-19 ENCOUNTER — Other Ambulatory Visit: Payer: Self-pay

## 2022-11-19 MED ORDER — PANTOPRAZOLE SODIUM 20 MG PO TBEC: DELAYED_RELEASE_TABLET | ORAL | 2 refills | Status: DC

## 2022-11-19 NOTE — Telephone Encounter (Signed)
Leah Huber with [facility] returned Leah Huber's phone call.  Relaying Dr. Bary Leriche message.  No other needs made know.

## 2022-11-19 NOTE — Telephone Encounter (Signed)
Patient states [facility] still has not received her prescription for pantoprazole (PROTONIX) 20 MG tablet.  Patient states she has been out of this medication for a while now and she is starting to notice it.

## 2022-11-19 NOTE — Telephone Encounter (Signed)
Ok to refill protonix.  Please clarify how she is taking - q day or qod.  Ok to send in to pharmacy of choice.

## 2022-11-19 NOTE — Telephone Encounter (Signed)
Spoke to pt and she stated that she takes 1 tablet daily. Only 20 mg. [facility]

## 2022-11-23 ENCOUNTER — Encounter: Payer: Self-pay | Admitting: Dermatology

## 2022-11-30 ENCOUNTER — Ambulatory Visit (INDEPENDENT_AMBULATORY_CARE_PROVIDER_SITE_OTHER): Payer: Medicare Other

## 2022-11-30 DIAGNOSIS — L209 Atopic dermatitis, unspecified: Secondary | ICD-10-CM

## 2022-11-30 MED ORDER — TRALOKINUMAB-LDRM 150 MG/ML ~~LOC~~ SOSY
600.0000 mg | PREFILLED_SYRINGE | Freq: Once | SUBCUTANEOUS | Status: AC
  Administered 2022-11-30: 600 mg via SUBCUTANEOUS

## 2022-11-30 NOTE — Progress Notes (Signed)
Patient here today to start Adbry injections for severe atopic dermatitis.   Adbry 150mg  X4 (600mg  total) injected into left upper arm. Patient tolerated well.  LOT: [id number] EXP: 05/2023 [id number]  Johnsie Kindred, RMA

## 2022-12-09 ENCOUNTER — Other Ambulatory Visit: Payer: Self-pay

## 2022-12-09 DIAGNOSIS — L209 Atopic dermatitis, unspecified: Secondary | ICD-10-CM

## 2022-12-09 MED ORDER — ADBRY 150 MG/ML ~~LOC~~ SOSY: 300.0000 mg | PREFILLED_SYRINGE | SUBCUTANEOUS | 4 refills | Status: DC

## 2022-12-09 NOTE — Progress Notes (Signed)
Fax from [facility] requesting pharmacy change to [place]. Escripted

## 2022-12-14 ENCOUNTER — Ambulatory Visit: Payer: Medicare Other

## 2022-12-14 DIAGNOSIS — H401131 Primary open-angle glaucoma, bilateral, mild stage: Secondary | ICD-10-CM | POA: Diagnosis not present

## 2022-12-15 DIAGNOSIS — Z961 Presence of intraocular lens: Secondary | ICD-10-CM | POA: Diagnosis not present

## 2022-12-15 DIAGNOSIS — H401131 Primary open-angle glaucoma, bilateral, mild stage: Secondary | ICD-10-CM | POA: Diagnosis not present

## 2022-12-15 DIAGNOSIS — H04123 Dry eye syndrome of bilateral lacrimal glands: Secondary | ICD-10-CM | POA: Diagnosis not present

## 2022-12-16 ENCOUNTER — Ambulatory Visit: Payer: Medicare Other

## 2022-12-23 ENCOUNTER — Ambulatory Visit (INDEPENDENT_AMBULATORY_CARE_PROVIDER_SITE_OTHER): Payer: Medicare Other

## 2022-12-23 DIAGNOSIS — L209 Atopic dermatitis, unspecified: Secondary | ICD-10-CM

## 2022-12-23 MED ORDER — TRALOKINUMAB-LDRM 150 MG/ML ~~LOC~~ SOSY
300.0000 mg | PREFILLED_SYRINGE | Freq: Once | SUBCUTANEOUS | Status: AC
  Administered 2022-12-23: 300 mg via SUBCUTANEOUS

## 2022-12-23 NOTE — Progress Notes (Signed)
Patient here today for Adbry 2 week injection for Severe Atopic Dermatitis.    Patient injected today with Adbry  X2 into the right Upper Arm. Patient tolerated procedure well.      Lot: [id number] Exp: 01/2024 [id number]   Dorathy Daft, RMA

## 2022-12-28 ENCOUNTER — Other Ambulatory Visit: Payer: Self-pay

## 2022-12-28 ENCOUNTER — Telehealth: Payer: Self-pay

## 2022-12-28 MED ORDER — TOLTERODINE TARTRATE ER 4 MG PO CP24: 4.0000 mg | ORAL_CAPSULE | Freq: Every day | ORAL | 3 refills | Status: DC

## 2022-12-28 NOTE — Telephone Encounter (Signed)
Refill sent.

## 2022-12-28 NOTE — Telephone Encounter (Signed)
Prescription Request  12/28/2022  LOV: Visit date not found  What is the name of the medication or equipment?  DETROL LA 4 MG 24 hr capsule    Have you contacted your pharmacy to request a refill? No   Which pharmacy would you like this sent to?  [facility] - [place], [place] - [address] [place] [address] Phone: [phone] Fax: [phone]    Patient notified that their request is being sent to the clinical staff for review and that they should receive a response within 2 business days.   Please advise at [facility] [phone]  Patient states she received a letter from [facility] cancelling her prescription for this medication since they have not received a new prescription from her doctor.  Patient states she would like to have a prescription for a 90-day supply.  Patient states she ran out of this medication last week.

## 2023-01-06 ENCOUNTER — Emergency Department: Payer: Medicare Other

## 2023-01-06 ENCOUNTER — Ambulatory Visit: Payer: Medicare Other

## 2023-01-06 ENCOUNTER — Emergency Department
Admission: EM | Admit: 2023-01-06 | Discharge: 2023-01-06 | Disposition: A | Discharge status: Home / Self Care | Payer: Medicare Other | Attending: Emergency Medicine | Admitting: Emergency Medicine

## 2023-01-06 ENCOUNTER — Telehealth: Payer: Self-pay

## 2023-01-06 ENCOUNTER — Other Ambulatory Visit: Payer: Self-pay

## 2023-01-06 ENCOUNTER — Telehealth: Payer: Self-pay | Admitting: *Deleted

## 2023-01-06 DIAGNOSIS — I2510 Atherosclerotic heart disease of native coronary artery without angina pectoris: Secondary | ICD-10-CM | POA: Diagnosis not present

## 2023-01-06 DIAGNOSIS — I129 Hypertensive chronic kidney disease with stage 1 through stage 4 chronic kidney disease, or unspecified chronic kidney disease: Secondary | ICD-10-CM | POA: Diagnosis not present

## 2023-01-06 DIAGNOSIS — Y92009 Unspecified place in unspecified non-institutional (private) residence as the place of occurrence of the external cause: Secondary | ICD-10-CM | POA: Insufficient documentation

## 2023-01-06 DIAGNOSIS — I10 Essential (primary) hypertension: Secondary | ICD-10-CM | POA: Diagnosis not present

## 2023-01-06 DIAGNOSIS — R42 Dizziness and giddiness: Secondary | ICD-10-CM | POA: Diagnosis not present

## 2023-01-06 DIAGNOSIS — R531 Weakness: Secondary | ICD-10-CM | POA: Diagnosis not present

## 2023-01-06 DIAGNOSIS — Y9301 Activity, walking, marching and hiking: Secondary | ICD-10-CM | POA: Diagnosis not present

## 2023-01-06 DIAGNOSIS — N183 Chronic kidney disease, stage 3 unspecified: Secondary | ICD-10-CM | POA: Diagnosis not present

## 2023-01-06 DIAGNOSIS — W19XXXA Unspecified fall, initial encounter: Secondary | ICD-10-CM

## 2023-01-06 DIAGNOSIS — Z23 Encounter for immunization: Secondary | ICD-10-CM | POA: Diagnosis not present

## 2023-01-06 DIAGNOSIS — D649 Anemia, unspecified: Secondary | ICD-10-CM | POA: Insufficient documentation

## 2023-01-06 DIAGNOSIS — M25532 Pain in left wrist: Secondary | ICD-10-CM | POA: Insufficient documentation

## 2023-01-06 DIAGNOSIS — M7989 Other specified soft tissue disorders: Secondary | ICD-10-CM | POA: Diagnosis not present

## 2023-01-06 DIAGNOSIS — S0101XA Laceration without foreign body of scalp, initial encounter: Secondary | ICD-10-CM | POA: Diagnosis not present

## 2023-01-06 DIAGNOSIS — S0993XA Unspecified injury of face, initial encounter: Secondary | ICD-10-CM | POA: Diagnosis present

## 2023-01-06 DIAGNOSIS — R9082 White matter disease, unspecified: Secondary | ICD-10-CM | POA: Insufficient documentation

## 2023-01-06 DIAGNOSIS — I959 Hypotension, unspecified: Secondary | ICD-10-CM | POA: Diagnosis not present

## 2023-01-06 DIAGNOSIS — R0602 Shortness of breath: Secondary | ICD-10-CM | POA: Insufficient documentation

## 2023-01-06 DIAGNOSIS — Z7901 Long term (current) use of anticoagulants: Secondary | ICD-10-CM | POA: Insufficient documentation

## 2023-01-06 DIAGNOSIS — S0181XA Laceration without foreign body of other part of head, initial encounter: Secondary | ICD-10-CM | POA: Diagnosis not present

## 2023-01-06 DIAGNOSIS — W01198A Fall on same level from slipping, tripping and stumbling with subsequent striking against other object, initial encounter: Secondary | ICD-10-CM | POA: Insufficient documentation

## 2023-01-06 DIAGNOSIS — S01112A Laceration without foreign body of left eyelid and periocular area, initial encounter: Secondary | ICD-10-CM | POA: Diagnosis not present

## 2023-01-06 LAB — COMPREHENSIVE METABOLIC PANEL
ALT: 14 U/L (ref 0–44)
AST: 31 U/L (ref 15–41)
Albumin: 4.6 g/dL (ref 3.5–5.0)
Alkaline Phosphatase: 59 U/L (ref 38–126)
Anion gap: 10 (ref 5–15)
BUN: 25 mg/dL — ABNORMAL HIGH (ref 8–23)
CO2: 24 mmol/L (ref 22–32)
Calcium: 9.6 mg/dL (ref 8.9–10.3)
Chloride: 102 mmol/L (ref 98–111)
Creatinine, Ser: 1.19 mg/dL — ABNORMAL HIGH (ref 0.44–1.00)
GFR, Estimated: 45 mL/min — ABNORMAL LOW (ref >60)
Glucose, Bld: 97 mg/dL (ref 70–99)
Potassium: 4.3 mmol/L (ref 3.5–5.1)
Sodium: 136 mmol/L (ref 135–145)
Total Bilirubin: 0.7 mg/dL (ref 0.3–1.2)
Total Protein: 7.8 g/dL (ref 6.5–8.1)

## 2023-01-06 LAB — CBC
HCT: 33.2 % — ABNORMAL LOW (ref 36.0–46.0)
Hemoglobin: 10.4 g/dL — ABNORMAL LOW (ref 12.0–15.0)
MCH: 34.3 pg — ABNORMAL HIGH (ref 26.0–34.0)
MCHC: 31.3 g/dL (ref 30.0–36.0)
MCV: 109.6 fL — ABNORMAL HIGH (ref 80.0–100.0)
Platelets: 308 10*3/uL (ref 150–400)
RBC: 3.03 MIL/uL — ABNORMAL LOW (ref 3.87–5.11)
RDW: 13.6 % (ref 11.5–15.5)
WBC: 7.3 10*3/uL (ref 4.0–10.5)
nRBC: 0 % (ref 0.0–0.2)

## 2023-01-06 LAB — BRAIN NATRIURETIC PEPTIDE: B Natriuretic Peptide: 161.5 pg/mL — ABNORMAL HIGH (ref 0.0–100.0)

## 2023-01-06 LAB — TROPONIN I (HIGH SENSITIVITY): Troponin I (High Sensitivity): 10 ng/L (ref <18)

## 2023-01-06 MED ORDER — TETANUS-DIPHTH-ACELL PERTUSSIS 5-2.5-18.5 LF-MCG/0.5 IM SUSY
0.5000 mL | PREFILLED_SYRINGE | Freq: Once | INTRAMUSCULAR | Status: AC
  Administered 2023-01-06: 0.5 mL via INTRAMUSCULAR
  Filled 2023-01-06: qty 0.5

## 2023-01-06 NOTE — ED Provider Notes (Signed)
[facility] Provider Note    Event Date/Time   First MD Initiated Contact with Patient 01/06/23 1029     (approximate)   History   Weakness and Fall   HPI  Leah Huber is a 82 y.o. female past medical history of anemia, atrial fibrillation on Eliquis who presents after fall.  Patient was walking in her house around 10 PM it was not lit up well and she had a fall.  She is not exactly sure why or how she fell she denies any preceding lightheadedness or dizziness denies loss of consciousness.  She did hit her head.  There was significant bleeding afterward.  She does endorse some pain in the left wrist and head pain denies vision change numbness tingling weakness.  She also notes that she has had some dyspnea on exertion for several months.  Her daughter who is at bedside says she looks paler to her and that she will typically have some dyspnea on exertion when she is anemic.  Patient denies blood loss.     Past Medical History:  Diagnosis Date   Abnormal liver function test 07/23/2019   Anemia    Aortic aneurysm (HCC)    "SMALL"   Arthritis    neck to tailbone   Bilateral carpal tunnel syndrome 12/08/2017   Bronchitis    Chronic cough    @ NIGHT/ SEASONAL ALLERGIES   Clostridium difficile infection 2013-2014   Colon polyp    Concussion    Coronary artery disease    Dr Gwen Pounds cardiologist   Depression    Diverticulosis    GERD (gastroesophageal reflux disease)    Glaucoma    Gout    Hemorrhoids    History of chicken pox    History of colonic polyps    History of shingles    Hypercholesterolemia    Hypertension    CONTROLLED ON MEDS   Hypovolemia 12/10   acute renal failure   IBS (irritable bowel syndrome)    Neuromuscular disorder (HCC)    occasionally tingling in hands/ NUMBNESS IN RIGHT FOOT s/p back surgery   Osteoarthritis    bilateral knees   Osteopenia    Pneumonia    HX OF   Sleep apnea    very mild   sleep study 8 yrs ago  does not use CPAP   Urinary incontinence     Patient Active Problem List   Diagnosis Date Noted   Ear fullness, left 09/13/2022   Back pain 02/02/2022   Hernia, inguinal 11/23/2021   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   Sleep difficulties 08/12/2021   Globus sensation 08/12/2021   Iron deficiency 04/30/2021   SOB (shortness of breath) on exertion 04/27/2021   Abdominal pain 04/25/2021   CKD (chronic kidney disease) stage 3, GFR 30-59 ml/min (HCC) 10/07/2020   B12 deficiency 09/23/2020   Diarrhea 02/12/2020   Abnormal liver function test 07/23/2019   A-fib (HCC) 10/29/2018   Sleep apnea 10/29/2018   Left shoulder pain 10/29/2018   Palpitations 03/20/2018   Bilateral carpal tunnel syndrome 12/08/2017   Hand numbness 10/26/2017   Cervical spondylosis with radiculopathy 11/28/2014   Aortic aneurysm (HCC) 11/04/2014   Fullness of breast 11/04/2014   Health care maintenance 11/04/2014   Neck pain 08/02/2014   Cough 04/08/2014   Abnormal CXR 04/08/2014   History of colonic polyps 01/31/2014   Stress 12/02/2013   Fall 10/22/2013   Hypoxia 06/27/2013   Glaucoma 03/31/2013   Anemia 07/28/2012  Urinary incontinence 07/28/2012   Hypertension 06/08/2012   Hypercholesterolemia 06/08/2012   Arthritis 06/08/2012   GERD (gastroesophageal reflux disease) 06/08/2012     Physical Exam  Triage Vital Signs: ED Triage Vitals  Enc Vitals Group     BP 01/06/23 1208 (!) 143/59     Pulse Rate 01/06/23 1208 70     Resp 01/06/23 1208 16     Temp 01/06/23 1208 98.6 F (37 C)     Temp src --      SpO2 01/06/23 1208 100 %     Weight 01/06/23 1018 141 lb (64 kg)     Height 01/06/23 1014 5\' 4"  (1.626 m)     Head Circumference --      Peak Flow --      Pain Score 01/06/23 1014 6     Pain Loc --      Pain Edu? --      Excl. in GC? --     Most recent vital signs: Vitals:   01/06/23 1208 01/06/23 1209  BP: (!) 143/59   Pulse: 70 70  Resp: 16   Temp: 98.6 F (37 C)    SpO2: 100% 100%     General: Awake, no distress.  CV:  Good peripheral perfusion.  Resp:  Normal effort.  Abd:  No distention.  Neuro:             Awake, Alert, Oriented x 3  Other:  There is mild anterior left wrist swelling there is pain at the snuffbox and pain with axial loading of the thumb 2+ radial pulse able to wiggle all digits Ecchymosis over the left shoulder but no tenderness or deformity able to range No C-spine tenderness fully able to range the neck Chest wall is nontender, pelvis stable nontender There is mild swelling lateral to the left orbit and a small well-approximated laceration lateral to the left eyebrow   ED Results / Procedures / Treatments  Labs (all labs ordered are listed, but only abnormal results are displayed) Labs Reviewed  CBC - Abnormal; Notable for the following components:      Result Value   RBC 3.03 (*)    Hemoglobin 10.4 (*)    HCT 33.2 (*)    MCV 109.6 (*)    MCH 34.3 (*)    All other components within normal limits  COMPREHENSIVE METABOLIC PANEL - Abnormal; Notable for the following components:   BUN 25 (*)    Creatinine, Ser 1.19 (*)    GFR, Estimated 45 (*)    All other components within normal limits  BRAIN NATRIURETIC PEPTIDE - Abnormal; Notable for the following components:   B Natriuretic Peptide 161.5 (*)    All other components within normal limits  URINALYSIS, ROUTINE W REFLEX MICROSCOPIC  CBG MONITORING, ED  TROPONIN I (HIGH SENSITIVITY)     EKG  EKG reviewed interpreted myself shows sinus rhythm with PACs no acute ischemic changes   RADIOLOGY I reviewed and interpreted the CT scan of the brain which does not show any acute intracranial process    PROCEDURES:  Critical Care performed: No  ..Laceration Repair  Date/Time: 01/06/2023 4:22 PM  Performed by: Georga Hacking, MD Authorized by: Georga Hacking, MD   Consent:    Consent obtained:  Verbal   Risks discussed:  Pain   Alternatives  discussed:  No treatment Universal protocol:    Patient identity confirmed:  Verbally with patient Anesthesia:    Anesthesia method:  None Laceration  details:    Location:  Face   Face location:  L eyebrow   Length (cm):  1 Treatment:    Area cleansed with:  Saline   Amount of cleaning:  Standard   Irrigation method:  Syringe   Visualized foreign bodies/material removed: no     Debridement:  None Skin repair:    Repair method:  Tissue adhesive Approximation:    Approximation:  Close Repair type:    Repair type:  Simple   The patient is on the cardiac monitor to evaluate for evidence of arrhythmia and/or significant heart rate changes.   MEDICATIONS ORDERED IN ED: Medications  Tdap (BOOSTRIX) injection 0.5 mL (0.5 mLs Intramuscular Given 01/06/23 1216)     IMPRESSION / MDM / ASSESSMENT AND PLAN / ED COURSE  I reviewed the triage vital signs and the nursing notes.                              Patient's presentation is most consistent with acute presentation with potential threat to life or bodily function.  Differential diagnosis includes, but is not limited to, cranial hemorrhage, skull fracture, scaphoid fracture, anemia, CHF, ACS  The patient is a 82 year old female who presents after a fall.  Fall occurred last night around 10 PM when she was walking in her house.  She does not remember tripping but also denies loss of consciousness and denies any presyncopal symptoms.  She did hit her head.  She endorses some pain in the left wrist as well as some mild head pain but no neurologic symptoms no chest or other extremity pain.  She does note she has had some dyspnea on exertion.  Her daughter thinks this is because of anemia.  She says has been going on for several weeks and is unchanged today.  Denies any chest pain with exertion.  Denies any blood loss.  On exam patient overall looks well she has a small well-approximated laceration that is lateral to the left eyebrow with  some periorbital ecchymosis but no significant facial deformity or swelling normal extraocular movements.  She has no C-spine tenderness.  There is some bruising on the left shoulder but no deformity or significant tenderness.  She is tender over the snuffbox and there is pain with axial loading of the thumb concerning for possible scaphoid fracture.  X-rays obtained of the hand and wrist do not show fracture but patient then placed in thumb spica and recommend she have repeat x-ray in about 7 to 10 days.  CT head does not show any acute abnormality.  CT C-spine was not ordered from triage and given patient has no tenderness and no pain and did not image the C-spine.  Labs obtained given patient's dyspnea on exertion are notable for anemia with hemoglobin of 10 which is not far off of her last hemoglobin which was around 11.  Is clear.  Troponin is negative.  BNP mildly elevated patient does not look clinically volume overloaded.  Tetanus shot was updated.  I did repair the laceration with skin glue.  Recommend she follow-up with oncology and primary care.       FINAL CLINICAL IMPRESSION(S) / ED DIAGNOSES   Final diagnoses:  Facial laceration, initial encounter  Fall, initial encounter     Rx / DC Orders   ED Discharge Orders     None        Note:  This document was prepared using Dragon  voice recognition software and may include unintentional dictation errors.   Georga Hacking, MD 01/06/23 [id number]

## 2023-01-06 NOTE — Discharge Instructions (Addendum)
Your x-ray of the left wrist did not show broken bone however you may have an occult scaphoid fracture.  You should have the x-ray repeated in about 7 to 10 days especially if you are having ongoing pain.  Please follow-up with your primary care doctor.  You can take Tylenol for pain.  You develop any new headache confusion or new neurologic symptoms such as numbness or weakness then please return to the emergency department.

## 2023-01-06 NOTE — ED Triage Notes (Signed)
Pt via POV from home. Per daughter pt has been weaker than normal. Pt states she fell last night and does not know what caused her to fall. Denies any dizziness prior to the fall. Pt has a lac above the L eyebrow and skin tear to the L hand. Pt c/o pain in those area. Pt does that Eliquis. States that recently she has been increasingly SOB when she exerts herself. Pt is A&OX4 and NAD

## 2023-01-06 NOTE — Telephone Encounter (Signed)
Spoke with her daughter. She is doing well. They are not quite sure what happened. Her blood pressure was slightly low. I did not see any reports of falls, or dizziness or hypotension (other than in the setting of an allergic reaction) with Adbry, so I do not think it is likely a factor in her fall. All questions answered.

## 2023-01-06 NOTE — Telephone Encounter (Signed)
Patient's daughter called regarding patient's Adbry. Patient did fall last night with no explanation as to why. Patient's daughter states this is the only thing she has changed in her medications and daily regimen and just was curious if something along these lines could be a possible side effect. Also patient's daughter just wanted you to be aware.

## 2023-01-06 NOTE — Telephone Encounter (Signed)
CAll from daughter Marcelino Duster reporting that patient fell and went to ER where labs were drawn as part of evaluation and her hgb has dropped to 10 so she is asking if patient can be checked in our office asap to see if she needs an infusion as she is also having a little shortness of breath that she gets when she needs infusion done. Please advise.  CBC Order: [id number] Status: Final result     Visible to patient: No (scheduled for 01/06/2023 12:20 PM)     Next appt: 01/08/2023 at 11:00 AM in Family Medicine ([facility])   0 Result Notes          Component Ref Range & Units 10:48 (01/06/23) 3 mo ago (10/06/22) 7 mo ago (06/08/22) 11 mo ago (01/13/22) 1 yr ago (09/16/21) 1 yr ago (06/18/21) 1 yr ago (05/16/21)  WBC 4.0 - 10.5 K/uL 7.3 5.0 5.6 6.5 6.9 5.2 4.9  RBC 3.87 - 5.11 MIL/uL 3.03 Low  3.39 Low  3.27 Low  3.21 Low  3.56 Low  3.81 Low  3.08 Low   Hemoglobin 12.0 - 15.0 g/dL 40.9 Low  81.1 Low  91.4 Low  11.3 Low  12.1 11.6 Low  9.0 Low   HCT 36.0 - 46.0 % 33.2 Low  36.0 34.3 Low  34.0 Low  36.7 37.4 29.8 Low   MCV 80.0 - 100.0 fL 109.6 High  106.2 High  104.9 High  105.9 High  103.1 High  98.2 96.8  MCH 26.0 - 34.0 pg 34.3 High  34.2 High  34.9 High  35.2 High  34.0 30.4 29.2  MCHC 30.0 - 36.0 g/dL 78.2 95.6 21.3 08.6 57.8 31.0 30.2  RDW 11.5 - 15.5 % 13.6 12.6 13.3 13.1 15.8 High  21.3 High  23.1 High   Platelets 150 - 400 K/uL 308 255 258 258 287 305 354  nRBC 0.0 - 0.2 % 0.0 0.0 0.0 0.0 0.0 0.0 0.0  Comment: Performed at [facility], [address] [place], [place] [address]  Neutrophils Relative %  54 R 51 R 50 R 52 R 45 R 49 R  Basophils Absolute  0.1 R 0.1 R 0.1 R 0.1 R 0.0 R 0.1 R  Immature Granulocytes  0 R 1 R 0 R 0 R 0 R 0 R  Abs Immature Granulocytes  0.02 R, CM 0.03 R, CM 0.02 R, CM 0.03 R, CM 0.01 R, CM 0.01 R, CM  Neutro Abs  2.7 R 2.9 R 3.3 R 3.6 R 2.4 R 2.4 R  Lymphocytes Relative  34 R 36 R 39 R 36 R 43 R 39 R  Lymphs Abs  1.7 R 2.0 R 2.5 R 2.5 R  2.2 R 1.9 R  Monocytes Relative  10 R 9 R 8 R 9 R 8 R 9 R  Monocytes Absolute  0.5 R 0.5 R 0.5 R 0.6 R 0.4 R 0.4 R  Eosinophils Relative  1 R 2 R 2 R 2 R 3 R 2 R  Eosinophils Absolute  0.1 R 0.1 R 0.1 R 0.1 R 0.1 R 0.1 R  Basophils Relative  1 R 1 R 1 R 1 R 1 R 1 R  Resulting Agency [facility]

## 2023-01-07 ENCOUNTER — Other Ambulatory Visit: Payer: Medicare Other

## 2023-01-08 ENCOUNTER — Encounter: Payer: Self-pay | Admitting: Internal Medicine

## 2023-01-08 ENCOUNTER — Other Ambulatory Visit (INDEPENDENT_AMBULATORY_CARE_PROVIDER_SITE_OTHER): Payer: Medicare Other

## 2023-01-08 DIAGNOSIS — I10 Essential (primary) hypertension: Secondary | ICD-10-CM

## 2023-01-08 DIAGNOSIS — E7800 Pure hypercholesterolemia, unspecified: Secondary | ICD-10-CM | POA: Diagnosis not present

## 2023-01-08 LAB — HEPATIC FUNCTION PANEL
ALT: 11 U/L (ref 0–35)
AST: 22 U/L (ref 0–37)
Albumin: 4.4 g/dL (ref 3.5–5.2)
Alkaline Phosphatase: 55 U/L (ref 39–117)
Bilirubin, Direct: 0.1 mg/dL (ref 0.0–0.3)
Total Bilirubin: 0.5 mg/dL (ref 0.2–1.2)
Total Protein: 6.7 g/dL (ref 6.0–8.3)

## 2023-01-08 LAB — BASIC METABOLIC PANEL
BUN: 22 mg/dL (ref 6–23)
CO2: 27 mEq/L (ref 19–32)
Calcium: 9.4 mg/dL (ref 8.4–10.5)
Chloride: 104 mEq/L (ref 96–112)
Creatinine, Ser: 1.16 mg/dL (ref 0.40–1.20)
GFR: 43.72 mL/min — ABNORMAL LOW (ref >60.00)
Glucose, Bld: 88 mg/dL (ref 70–99)
Potassium: 4.7 mEq/L (ref 3.5–5.1)
Sodium: 140 mEq/L (ref 135–145)

## 2023-01-08 LAB — LIPID PANEL
Cholesterol: 173 mg/dL (ref 0–200)
HDL: 96.8 mg/dL (ref >39.00)
LDL Cholesterol: 60 mg/dL (ref 0–99)
NonHDL: 76.09
Total CHOL/HDL Ratio: 2
Triglycerides: 78 mg/dL (ref 0.0–149.0)
VLDL: 15.6 mg/dL (ref 0.0–40.0)

## 2023-01-08 NOTE — Telephone Encounter (Signed)
Patient appointment move to 5/15

## 2023-01-11 ENCOUNTER — Ambulatory Visit (INDEPENDENT_AMBULATORY_CARE_PROVIDER_SITE_OTHER): Payer: Medicare Other | Admitting: Internal Medicine

## 2023-01-11 VITALS — BP 122/70 | HR 84 | Temp 98.0°F | Resp 16 | Ht 64.0 in | Wt 144.4 lb

## 2023-01-11 DIAGNOSIS — I712 Thoracic aortic aneurysm, without rupture, unspecified: Secondary | ICD-10-CM

## 2023-01-11 DIAGNOSIS — E7800 Pure hypercholesterolemia, unspecified: Secondary | ICD-10-CM | POA: Diagnosis not present

## 2023-01-11 DIAGNOSIS — I4891 Unspecified atrial fibrillation: Secondary | ICD-10-CM

## 2023-01-11 DIAGNOSIS — N1832 Chronic kidney disease, stage 3b: Secondary | ICD-10-CM | POA: Diagnosis not present

## 2023-01-11 DIAGNOSIS — W19XXXD Unspecified fall, subsequent encounter: Secondary | ICD-10-CM | POA: Diagnosis not present

## 2023-01-11 DIAGNOSIS — I10 Essential (primary) hypertension: Secondary | ICD-10-CM | POA: Diagnosis not present

## 2023-01-11 DIAGNOSIS — M25532 Pain in left wrist: Secondary | ICD-10-CM | POA: Insufficient documentation

## 2023-01-11 DIAGNOSIS — D692 Other nonthrombocytopenic purpura: Secondary | ICD-10-CM | POA: Diagnosis not present

## 2023-01-11 DIAGNOSIS — D649 Anemia, unspecified: Secondary | ICD-10-CM | POA: Diagnosis not present

## 2023-01-11 NOTE — Progress Notes (Signed)
Subjective:    Patient ID: Leah Huber, female    DOB: [birth year], 82 y.o.   MRN: [id number]  Patient here for ED follow up.   HPI Here for ED follow up.  She is accompanied by her daughter.  History obtained from both of them.  Was seen 01/06/23 - after falling in her house. No syncope or near syncopal feeling prior to her fall.  No LOC. She did hit her head. CT head - no acute intracranial abnormality. Laceration over left eye - repaired. Wrist pain.  Xray - no fracture.  Placed in thumb spica. Hgb 10.  Has f/u with hematology next week.  Comes in today - denies headache. Some point tenderness over left eyebrow.  No chest pain.  Breathing stable.  No increased cough or congestion.  She reports breathing stable.  No abdominal pain or bowel change.    Past Medical History:  Diagnosis Date   Abnormal liver function test 07/23/2019   Anemia    Aortic aneurysm (HCC)    "SMALL"   Arthritis    neck to tailbone   Bilateral carpal tunnel syndrome 12/08/2017   Bronchitis    Chronic cough    @ NIGHT/ SEASONAL ALLERGIES   Clostridium difficile infection 2013-2014   Colon polyp    Concussion    Coronary artery disease    Dr Gwen Pounds cardiologist   Depression    Diverticulosis    GERD (gastroesophageal reflux disease)    Glaucoma    Gout    Hemorrhoids    History of chicken pox    History of colonic polyps    History of shingles    Hypercholesterolemia    Hypertension    CONTROLLED ON MEDS   Hypovolemia 12/10   acute renal failure   IBS (irritable bowel syndrome)    Neuromuscular disorder (HCC)    occasionally tingling in hands/ NUMBNESS IN RIGHT FOOT s/p back surgery   Osteoarthritis    bilateral knees   Osteopenia    Pneumonia    HX OF   Sleep apnea    very mild   sleep study 8 yrs ago does not use CPAP   Urinary incontinence    Past Surgical History:  Procedure Laterality Date   ABDOMINAL HYSTERECTOMY  1979   ANTERIOR CERVICAL DECOMP/DISCECTOMY FUSION N/A 11/28/2014    Procedure: CERVICAL FIVE-SIX  ANTERIOR CERVICAL DECOMPRESSION/DISCECTOMY FUSION 1 LEVEL;  Surgeon: Tressie Stalker, MD;  Location: [facility];  Service: Neurosurgery;  Laterality: N/A;  C56 anterior cervical decompression with fusion interbody prosthesis plating and bonegraft   BACK SURGERY     X2/ L4,5 & S1/ Cervical C7?   BLADDER SURGERY  1981   SUSPENSION   BREAST REDUCTION SURGERY     CARDIOVERSION N/A 10/29/2020   Procedure: CARDIOVERSION;  Surgeon: Lamar Blinks, MD;  Location: [facility];  Service: Cardiovascular;  Laterality: N/A;   CARPAL TUNNEL RELEASE  2007   CATARACT EXTRACTION W/PHACO Right 12/30/2016   Procedure: CATARACT EXTRACTION PHACO AND INTRAOCULAR LENS PLACEMENT (IOC)  Right toric lens;  Surgeon: Lockie Mola, MD;  Location: [facility];  Service: Ophthalmology;  Laterality: Right;  sleep apnea, does not use CPAP   CATARACT EXTRACTION W/PHACO Left 01/20/2017   Procedure: CATARACT EXTRACTION PHACO AND INTRAOCULAR LENS PLACEMENT (IOC)  Left Toric;  Surgeon: Lockie Mola, MD;  Location: [facility];  Service: Ophthalmology;  Laterality: Left;  toric lens   CHOLECYSTECTOMY  1996   COLONOSCOPY  COLONOSCOPY WITH PROPOFOL N/A 05/22/2019   Procedure: COLONOSCOPY WITH PROPOFOL;  Surgeon: Toledo, Boykin Nearing, MD;  Location: [facility];  Service: Gastroenterology;  Laterality: N/A;   FOOT SURGERY  2005   JOINT REPLACEMENT Bilateral    KNEE   LAMINECTOMY  1983 & 1985   REDUCTION MAMMAPLASTY Bilateral 2001   ROTATOR CUFF REPAIR  2000 & 2004   left and right   SPINAL CORD STIMULATOR IMPLANT  03/1997   SPINAL CORD STIMULATOR REMOVAL  2007   Tendon repair right shoulder     TONSILLECTOMY     TOTAL KNEE ARTHROPLASTY  10/2011 & 09/13   left and right   Family History  Problem Relation Age of Onset   Heart disease Mother    Stroke Mother    Hypertension Mother    Hyperlipidemia Father    Heart disease Father    Heart disease Brother        2  brothers   Alzheimer's disease Brother    Heart disease Sister        pacemaker   Breast cancer Daughter 75   Social History   Socioeconomic History   Marital status: Widowed    Spouse name: Not on file   Number of children: 2   Years of education: Not on file   Highest education level: 12th grade  Occupational History   Not on file  Tobacco Use   Smoking status: Former    Packs/day: 1.00    Years: 20.00    Additional pack years: 0.00    Total pack years: 20.00    Types: Cigarettes    Quit date: 09/07/1988    Years since quitting: 34.3   Smokeless tobacco: Never  Vaping Use   Vaping Use: Never used  Substance and Sexual Activity   Alcohol use: Yes    Alcohol/week: 2.0 standard drinks of alcohol    Types: 2 Glasses of wine per week    Comment: occasional   Drug use: No   Sexual activity: Never  Other Topics Concern   Not on file  Social History Narrative   She is widowed, has two daughters. [place]; quit smoking- in 1990s; social alcohol.    Social Determinants of Health   Financial Resource Strain: Low Risk  (01/08/2023)   Overall Financial Resource Strain (CARDIA)    Difficulty of Paying Living Expenses: Not hard at all  Food Insecurity: No Food Insecurity (01/12/2023)   Hunger Vital Sign    Worried About Running Out of Food in the Last Year: Never true    Ran Out of Food in the Last Year: Never true  Transportation Needs: No Transportation Needs (01/12/2023)   PRAPARE - Administrator, Civil Service (Medical): No    Lack of Transportation (Non-Medical): No  Physical Activity: Insufficiently Active (01/08/2023)   Exercise Vital Sign    Days of Exercise per Week: 4 days    Minutes of Exercise per Session: 20 min  Stress: No Stress Concern Present (01/08/2023)   [employer] of Occupational Health - Occupational Stress Questionnaire    Feeling of Stress : Not at all  Social Connections: Moderately Integrated (01/08/2023)   Social Connection and Isolation  Panel [NHANES]    Frequency of Communication with Friends and Family: More than three times a week    Frequency of Social Gatherings with Friends and Family: Twice a week    Attends Religious Services: More than 4 times per year    Active Member of  Clubs or Organizations: Yes    Attends Banker Meetings: More than 4 times per year    Marital Status: Widowed     Review of Systems  Constitutional:  Negative for appetite change and unexpected weight change.  HENT:  Negative for congestion and sinus pressure.   Respiratory:  Negative for cough and chest tightness.        Breathing stable.   Cardiovascular:  Negative for chest pain and palpitations.  Gastrointestinal:  Negative for abdominal pain, diarrhea, nausea and vomiting.  Genitourinary:  Negative for difficulty urinating and dysuria.  Musculoskeletal:  Negative for myalgias.       Wrist pain as outlined.   Skin:  Negative for color change and rash.  Neurological:  Negative for dizziness and headaches.  Psychiatric/Behavioral:  Negative for agitation and dysphoric mood.        Objective:     BP 122/70   Pulse 84   Temp 98 F (36.7 C)   Resp 16   Ht 5\' 4"  (1.626 m)   Wt 144 lb 6.4 oz (65.5 kg)   LMP 08/15/1978   SpO2 98%   BMI 24.79 kg/m  Wt Readings from Last 3 Encounters:  01/11/23 144 lb 6.4 oz (65.5 kg)  01/06/23 141 lb (64 kg)  10/09/22 144 lb 11.2 oz (65.6 kg)    Physical Exam Vitals reviewed.  Constitutional:      General: She is not in acute distress.    Appearance: Normal appearance.  HENT:     Head: Normocephalic and atraumatic.     Comments: Laceration - over left eyebrow - closed.  No increased erythema.     Right Ear: External ear normal.     Left Ear: External ear normal.  Eyes:     General: No scleral icterus.       Right eye: No discharge.        Left eye: No discharge.     Conjunctiva/sclera: Conjunctivae normal.  Neck:     Thyroid: No thyromegaly.  Cardiovascular:     Rate  and Rhythm: Normal rate and regular rhythm.  Pulmonary:     Effort: No respiratory distress.     Breath sounds: Normal breath sounds. No wheezing.  Abdominal:     General: Bowel sounds are normal.     Palpations: Abdomen is soft.     Tenderness: There is no abdominal tenderness.  Musculoskeletal:     Cervical back: Neck supple. No tenderness.     Comments: Pain left wrist.    Lymphadenopathy:     Cervical: No cervical adenopathy.  Skin:    Findings: No erythema or rash.  Neurological:     Mental Status: She is alert.  Psychiatric:        Mood and Affect: Mood normal.        Behavior: Behavior normal.      Outpatient Encounter Medications as of 01/11/2023  Medication Sig   carbamide peroxide (DEBROX) 6.5 % OTIC solution 4-5 drops in left ear.  Massage for approximately 5 minutes daily.   cyanocobalamin 1000 MCG tablet Take 1,000 mcg by mouth.   ELIQUIS 2.5 MG TABS tablet Take 2.5 mg by mouth 2 (two) times daily.   latanoprost (XALATAN) 0.005 % ophthalmic solution Place 1 drop into both eyes at bedtime.    Lifitegrast (XIIDRA) 5 % SOLN Place 1 drop into both eyes daily.   losartan (COZAAR) 25 MG tablet 25 mg daily.   metoprolol succinate (TOPROL-XL) 25 MG  24 hr tablet Take 25 mg by mouth in the morning and at bedtime.   pantoprazole (PROTONIX) 20 MG tablet One tablet qod   rosuvastatin (CRESTOR) 10 MG tablet TAKE 1 TABLET DAILY   tolterodine (DETROL LA) 4 MG 24 hr capsule Take 1 capsule (4 mg total) by mouth daily.   Tralokinumab-ldrm (ADBRY) 150 MG/ML SOSY Inject 2 mLs (300 mg total) into the skin every 14 (fourteen) days. Starting on day 15 for maintenance.   triamcinolone ointment (KENALOG) 0.1 % Apply 1 Application topically daily as needed. Avoid face, groin and axilla   [DISCONTINUED] dupilumab (DUPIXENT) 300 MG/2ML prefilled syringe Inject 300 mg into the skin every 14 (fourteen) days. Starting at day 15 for maintenance.   No facility-administered encounter medications on  file as of 01/11/2023.     Lab Results  Component Value Date   WBC 7.3 01/06/2023   HGB 10.4 (L) 01/06/2023   HCT 33.2 (L) 01/06/2023   PLT 308 01/06/2023   GLUCOSE 88 01/08/2023   CHOL 173 01/08/2023   TRIG 78.0 01/08/2023   HDL 96.80 01/08/2023   LDLDIRECT 145.0 01/06/2021   LDLCALC 60 01/08/2023   ALT 11 01/08/2023   AST 22 01/08/2023   NA 140 01/08/2023   K 4.7 01/08/2023   CL 104 01/08/2023   CREATININE 1.16 01/08/2023   BUN 22 01/08/2023   CO2 27 01/08/2023   TSH 2.85 09/04/2022   INR 1.0 10/16/2013   HGBA1C 5.5 07/18/2019    DG Wrist Complete Left  Result Date: 01/06/2023 CLINICAL DATA:  Wrist pain after fall last night EXAM: LEFT WRIST - COMPLETE 3+ VIEW COMPARISON:  None Available. FINDINGS: Severe degenerative changes seen involving the first carpometacarpal joint. Chondrocalcinosis is seen involving the triangular fibrocartilage. No acute fracture or dislocation is noted. IMPRESSION: Osteoarthritis of first carpometacarpal joint. No acute abnormality seen. Electronically Signed   By: Lupita Raider M.D.   On: 01/06/2023 11:56   DG Chest 2 View  Result Date: 01/06/2023 CLINICAL DATA:  Shortness of breath. Weakness and fall. Hypotension. EXAM: CHEST - 2 VIEW COMPARISON:  CT angio chest 04/04/2018 FINDINGS: The heart is mildly enlarged. Atherosclerotic calcifications are present at the aortic arch. The lungs are clear. No edema or effusion is present. No focal airspace disease is present. Postoperative changes are noted in the cervical spine. Surgical clips are present at the gallbladder fossa. The visualized soft tissues and bony thorax are unremarkable. IMPRESSION: 1. Mild cardiomegaly without failure. 2. No acute cardiopulmonary disease. Electronically Signed   By: Marin Roberts M.D.   On: 01/06/2023 10:55   DG Hand Complete Left  Result Date: 01/06/2023 CLINICAL DATA:  Weakness.  Fall.  Hand swelling EXAM: LEFT HAND - COMPLETE 3 VIEW COMPARISON:  None Available.  FINDINGS: Severe osteopenia. No fracture or dislocation. Chondrocalcinosis about the wrist and at the third metacarpophalangeal joints. Moderate degenerative changes of the first carpometacarpal joint with sclerosis, joint space loss and hypertrophic change. Mild degenerative changes elsewhere. IMPRESSION: Osteopenia with chondrocalcinosis and degenerative changes. Electronically Signed   By: Karen Kays M.D.   On: 01/06/2023 10:50   CT HEAD WO CONTRAST  Result Date: 01/06/2023 CLINICAL DATA:  Head trauma. Fall last night. Dizziness proceeded fall. Supraorbital scalp laceration. EXAM: CT HEAD WITHOUT CONTRAST TECHNIQUE: Contiguous axial images were obtained from the base of the skull through the vertex without intravenous contrast. RADIATION DOSE REDUCTION: This exam was performed according to the departmental dose-optimization program which includes automated exposure control, adjustment of  the mA and/or kV according to patient size and/or use of iterative reconstruction technique. COMPARISON:  CT head without contrast 03/04/2017. FINDINGS: Brain: Moderate generalized atrophy and white matter disease is present. No acute infarct, hemorrhage, or mass lesion is present. Deep brain nuclei are within normal limits. The ventricles are of normal size. No significant extraaxial fluid collection is present. The brainstem and cerebellum are within normal limits. Midline structures are within normal limits. Vascular: Atherosclerotic calcifications are again noted within the cavernous internal carotid arteries. No hyperdense vessel is present. Skull: Calvarium is intact. No focal lytic or blastic lesions are present. A left supraorbital scalp laceration is present. No underlying fracture or foreign body is present. Sinuses/Orbits: Posterior left ethmoid air cells are opacified. Bilateral maxillary antrostomies are noted. The remaining paranasal sinuses are clear. Bilateral lens replacements are noted. Globes and orbits  are otherwise unremarkable. IMPRESSION: 1. Left supraorbital scalp laceration without underlying fracture or foreign body. 2. Stable moderate generalized atrophy and white matter disease. This likely reflects the sequela of chronic microvascular ischemia. 3. No acute intracranial abnormality or significant interval change. Electronically Signed   By: Marin Roberts M.D.   On: 01/06/2023 10:44       Assessment & Plan:  Left wrist pain Assessment & Plan: Left wrist pain as outlined.  Xray in ER - no fracture.  Recommended f/u in 7-10 days.  Splint.  Recheck xray.  Further treatment pending results.   Orders: -     DG Wrist Complete Left; Future  Atrial fibrillation, unspecified type [facility]) Assessment & Plan: On metoprolol.  On eliquis. S/P ablation in September. Overall appears to be stable.       Anemia, unspecified type Assessment & Plan: Being followed by Dr Donneta Romberg.  IV venofer prn.  Follow cbc. Hgb in ER 10.  Has appt next week.    Thoracic aortic aneurysm without rupture, unspecified part [facility]) Assessment & Plan: Has been followed by cardiology.    Stage 3b chronic kidney disease (HCC) Assessment & Plan: Avoid nephrotoxins.  On losartan. Follow pressures.  Follow metabolic panel.     Fall, subsequent encounter Assessment & Plan: Recent fall as outlined.  Unclear etiology.  Denies any syncope or near syncope.  No LOC.  W/up in ER as outlined.  Denies EtOH.  Follow.     Hypercholesterolemia Assessment & Plan: On crestor.  Low cholesterol diet and exercise.  Follow lipid panel and liver function tests.    Orders: -     Lipid panel; Future -     Hepatic function panel; Future  Primary hypertension Assessment & Plan: Continue on losartan and metoprolol.  Blood pressure doing well.  Same medication regimen.  Follow.    Orders: -     Basic metabolic panel; Future  Other nonthrombocytopenic purpura (HCC) Assessment & Plan: History of afib.  On eliquis.        Dale [place], MD

## 2023-01-12 ENCOUNTER — Ambulatory Visit: Payer: Medicare Other

## 2023-01-12 ENCOUNTER — Telehealth: Payer: Self-pay | Admitting: *Deleted

## 2023-01-12 NOTE — Transitions of Care (Post Inpatient/ED Visit) (Signed)
01/12/2023  Name: Leah Huber MRN: [id number] DOB: 12/17/1940  Today's TOC FU Call Status: Today's TOC FU Call Status:: Successful TOC FU Call Competed TOC FU Call Complete Date: 01/12/23  Transition Care Management Follow-up Telephone Call Date of Discharge: 01/07/23 Discharge Facility: [facility]) Type of Discharge: Emergency Department Reason for ED Visit: Other: (fall) How have you been since you were released from the hospital?: Better Any questions or concerns?: No  Items Reviewed: Did you receive and understand the discharge instructions provided?: Yes Medications obtained,verified, and reconciled?: Yes (Medications Reviewed) Any new allergies since your discharge?: No Dietary orders reviewed?: No Do you have support at home?: Yes People in Home: alone Name of Support/Comfort Primary Source: Leah Huber  Medications Reviewed Today: Medications Reviewed Today     Reviewed by Luella Cook, RN (Case Manager) on 01/12/23 at 1146  Med List Status: <None>   Medication Order Taking? Sig Documenting Provider Last Dose Status Informant  carbamide peroxide (DEBROX) 6.5 % OTIC solution [id number]  4-5 drops in left ear.  Massage for approximately 5 minutes daily. Dale [place], MD  Active   cyanocobalamin 1000 MCG tablet [id number] Yes Take 1,000 mcg by mouth. [provider] Taking Active   ELIQUIS 2.5 MG TABS tablet [id number] Yes Take 2.5 mg by mouth 2 (two) times daily. [provider] Taking Active   latanoprost (XALATAN) 0.005 % ophthalmic solution [id number] Yes Place 1 drop into both eyes at bedtime.  [provider] Taking Active Self           Med Note Jonna Clark Mar 04, 2017  9:43 AM)    Lifitegrast Benay Spice) 5 % SOLN [id number]  Place 1 drop into both eyes daily. [provider]  Active Self  losartan (COZAAR) 25 MG tablet [id number] Yes 25 mg daily. [provider] Taking Active    metoprolol succinate (TOPROL-XL) 25 MG 24 hr tablet [id number] Yes Take 25 mg by mouth in the morning and at bedtime. [provider] Taking Active Self  pantoprazole (PROTONIX) 20 MG tablet [id number] Yes One tablet qod Dale [place], MD Taking Active   rosuvastatin (CRESTOR) 10 MG tablet [id number] Yes TAKE 1 TABLET DAILY Dale [place], MD Taking Active   tolterodine (DETROL LA) 4 MG 24 hr capsule [id number] Yes Take 1 capsule (4 mg total) by mouth daily. Dale [place], MD Taking Active   Tralokinumab-ldrm [facility]) 150 MG/ML SOSY [id number]  Inject 2 mLs (300 mg total) into the skin every 14 (fourteen) days. Starting on day 15 for maintenance. [place], [place], MD  Active   triamcinolone ointment (KENALOG) 0.1 % [id number] Yes Apply 1 Application topically daily as needed. Avoid face, groin and axilla [place], [place], MD Taking Active             Home Care and Equipment/Supplies: Were Home Health Services Ordered?: No Any new equipment or medical supplies ordered?: No  Functional Questionnaire: Do you need assistance with bathing/showering or dressing?: No Do you need assistance with meal preparation?: No Do you need assistance with eating?: No Do you have difficulty maintaining continence: No Do you need assistance with getting out of bed/getting out of a chair/moving?: No Do you have difficulty managing or taking your medications?: No  Follow up appointments reviewed: PCP Follow-up appointment confirmed?: Yes Date of PCP follow-up appointment?: 01/11/23 Follow-up Provider: Dr Westley Hummer [facility] Follow-up appointment confirmed?: Yes Date of Specialist follow-up appointment?: 01/13/23 Follow-Up Specialty Provider:: [id number] Minda Ditto 10:30,  [id number]  Dermatologist 9:30 Do you need transportation to your follow-up appointment?: No Do you understand care options if your condition(s) worsen?: No-reviewed care options from [facility]  SDOH Interventions Today     Flowsheet Row Most Recent Value  SDOH Interventions   Food Insecurity Interventions Intervention Not Indicated  Housing Interventions Intervention Not Indicated  Transportation Interventions Intervention Not Indicated      Interventions Today    Flowsheet Row Most Recent Value  General Interventions   General Interventions Discussed/Reviewed General Interventions Discussed, General Interventions Reviewed, Doctor Visits  Doctor Visits Discussed/Reviewed Doctor Visits Discussed, Doctor Visits Reviewed      TOC Interventions Today    Flowsheet Row Most Recent Value  TOC Interventions   TOC Interventions Discussed/Reviewed TOC Interventions Discussed, TOC Interventions Reviewed  [Per patient she has a follow up xray scheduled for her hand as per ED Dr suggested.]        Gean Maidens BSN RN [facility] [phone]

## 2023-01-14 ENCOUNTER — Ambulatory Visit (INDEPENDENT_AMBULATORY_CARE_PROVIDER_SITE_OTHER): Payer: Medicare Other

## 2023-01-14 ENCOUNTER — Ambulatory Visit
Admission: RE | Admit: 2023-01-14 | Discharge: 2023-01-14 | Disposition: A | Discharge status: Home / Self Care | Payer: Medicare Other | Attending: Internal Medicine | Admitting: Internal Medicine

## 2023-01-14 ENCOUNTER — Ambulatory Visit
Admission: RE | Admit: 2023-01-14 | Discharge: 2023-01-14 | Disposition: A | Discharge status: Home / Self Care | Payer: Medicare Other | Source: Ambulatory Visit | Attending: Internal Medicine | Admitting: Internal Medicine

## 2023-01-14 DIAGNOSIS — M25532 Pain in left wrist: Secondary | ICD-10-CM | POA: Insufficient documentation

## 2023-01-14 DIAGNOSIS — L209 Atopic dermatitis, unspecified: Secondary | ICD-10-CM | POA: Diagnosis not present

## 2023-01-14 DIAGNOSIS — M7989 Other specified soft tissue disorders: Secondary | ICD-10-CM | POA: Diagnosis not present

## 2023-01-14 MED ORDER — TRALOKINUMAB-LDRM 150 MG/ML ~~LOC~~ SOSY
300.0000 mg | PREFILLED_SYRINGE | Freq: Once | SUBCUTANEOUS | Status: AC
Start: 2023-01-14 — End: 2023-01-14
  Administered 2023-01-14: 300 mg via SUBCUTANEOUS

## 2023-01-14 NOTE — Progress Notes (Addendum)
Patient here today for Adbry 2 week injection for Severe Atopic Dermatitis.    Patient injected today with Adbry 150mg  X2 into the left Upper Arm. Patient tolerated procedure well.    Lot: [id number] Exp: 01/2024 [id number]:[id number]   Dorathy Daft, RMA

## 2023-01-17 ENCOUNTER — Encounter: Payer: Self-pay | Admitting: Internal Medicine

## 2023-01-17 NOTE — Assessment & Plan Note (Signed)
On metoprolol.  On eliquis. S/P ablation in September. Overall appears to be stable.

## 2023-01-17 NOTE — Assessment & Plan Note (Signed)
Continue on losartan and metoprolol.  Blood pressure doing well.  Same medication regimen.  Follow.   

## 2023-01-17 NOTE — Assessment & Plan Note (Signed)
Recent fall as outlined.  Unclear etiology.  Denies any syncope or near syncope.  No LOC.  W/up in ER as outlined.  Denies EtOH.  Follow.

## 2023-01-17 NOTE — Assessment & Plan Note (Signed)
Has been followed by cardiology.   

## 2023-01-17 NOTE — Assessment & Plan Note (Signed)
History of afib. On eliquis.   °

## 2023-01-17 NOTE — Assessment & Plan Note (Signed)
Left wrist pain as outlined.  Xray in ER - no fracture.  Recommended f/u in 7-10 days.  Splint.  Recheck xray.  Further treatment pending results.

## 2023-01-17 NOTE — Assessment & Plan Note (Signed)
Avoid nephrotoxins.  On losartan. Follow pressures.  Follow metabolic panel.  

## 2023-01-17 NOTE — Assessment & Plan Note (Signed)
Being followed by Dr Donneta Romberg.  IV venofer prn.  Follow cbc. Hgb in ER 10.  Has appt next week.

## 2023-01-17 NOTE — Assessment & Plan Note (Signed)
On crestor.  Low cholesterol diet and exercise.  Follow lipid panel and liver function tests.   

## 2023-01-20 ENCOUNTER — Inpatient Hospital Stay: Payer: Medicare Other | Attending: Internal Medicine

## 2023-01-20 ENCOUNTER — Inpatient Hospital Stay (HOSPITAL_BASED_OUTPATIENT_CLINIC_OR_DEPARTMENT_OTHER): Payer: Medicare Other | Admitting: Internal Medicine

## 2023-01-20 ENCOUNTER — Encounter: Payer: Self-pay | Admitting: Internal Medicine

## 2023-01-20 ENCOUNTER — Inpatient Hospital Stay: Payer: Medicare Other

## 2023-01-20 VITALS — BP 146/65 | HR 73

## 2023-01-20 DIAGNOSIS — N183 Chronic kidney disease, stage 3 unspecified: Secondary | ICD-10-CM | POA: Diagnosis not present

## 2023-01-20 DIAGNOSIS — D509 Iron deficiency anemia, unspecified: Secondary | ICD-10-CM | POA: Insufficient documentation

## 2023-01-20 DIAGNOSIS — E611 Iron deficiency: Secondary | ICD-10-CM | POA: Diagnosis not present

## 2023-01-20 LAB — BASIC METABOLIC PANEL
Anion gap: 11 (ref 5–15)
BUN: 31 mg/dL — ABNORMAL HIGH (ref 8–23)
CO2: 21 mmol/L — ABNORMAL LOW (ref 22–32)
Calcium: 9.5 mg/dL (ref 8.9–10.3)
Chloride: 106 mmol/L (ref 98–111)
Creatinine, Ser: 1.35 mg/dL — ABNORMAL HIGH (ref 0.44–1.00)
GFR, Estimated: 39 mL/min — ABNORMAL LOW (ref >60)
Glucose, Bld: 105 mg/dL — ABNORMAL HIGH (ref 70–99)
Potassium: 4.5 mmol/L (ref 3.5–5.1)
Sodium: 138 mmol/L (ref 135–145)

## 2023-01-20 LAB — CBC WITH DIFFERENTIAL/PLATELET
Abs Immature Granulocytes: 0.05 10*3/uL (ref 0.00–0.07)
Basophils Absolute: 0.1 10*3/uL (ref 0.0–0.1)
Basophils Relative: 1 %
Eosinophils Absolute: 0.1 10*3/uL (ref 0.0–0.5)
Eosinophils Relative: 1 %
HCT: 33.8 % — ABNORMAL LOW (ref 36.0–46.0)
Hemoglobin: 10.8 g/dL — ABNORMAL LOW (ref 12.0–15.0)
Immature Granulocytes: 1 %
Lymphocytes Relative: 31 %
Lymphs Abs: 1.8 10*3/uL (ref 0.7–4.0)
MCH: 35 pg — ABNORMAL HIGH (ref 26.0–34.0)
MCHC: 32 g/dL (ref 30.0–36.0)
MCV: 109.4 fL — ABNORMAL HIGH (ref 80.0–100.0)
Monocytes Absolute: 0.6 10*3/uL (ref 0.1–1.0)
Monocytes Relative: 10 %
Neutro Abs: 3.3 10*3/uL (ref 1.7–7.7)
Neutrophils Relative %: 56 %
Platelets: 330 10*3/uL (ref 150–400)
RBC: 3.09 MIL/uL — ABNORMAL LOW (ref 3.87–5.11)
RDW: 13.6 % (ref 11.5–15.5)
WBC: 5.9 10*3/uL (ref 4.0–10.5)
nRBC: 0 % (ref 0.0–0.2)

## 2023-01-20 LAB — IRON AND TIBC
Iron: 67 ug/dL (ref 28–170)
Saturation Ratios: 15 % (ref 10.4–31.8)
TIBC: 435 ug/dL (ref 250–450)
UIBC: 368 ug/dL

## 2023-01-20 LAB — FERRITIN: Ferritin: 20 ng/mL (ref 11–307)

## 2023-01-20 LAB — LACTATE DEHYDROGENASE: LDH: 185 U/L (ref 98–192)

## 2023-01-20 LAB — VITAMIN B12: Vitamin B-12: 1258 pg/mL — ABNORMAL HIGH (ref 180–914)

## 2023-01-20 MED ORDER — SODIUM CHLORIDE 0.9 % IV SOLN
200.0000 mg | Freq: Once | INTRAVENOUS | Status: AC
  Administered 2023-01-20: 200 mg via INTRAVENOUS
  Filled 2023-01-20: qty 200

## 2023-01-20 MED ORDER — SODIUM CHLORIDE 0.9 % IV SOLN
Freq: Once | INTRAVENOUS | Status: AC
  Filled 2023-01-20: qty 250

## 2023-01-20 NOTE — Progress Notes (Signed)
[facility] CONSULT NOTE  Patient Care Team: Dale Dimock, MD as PCP - General (Internal Medicine) Stanton Kidney, MD as Consulting Physician (Gastroenterology) Earna Coder, MD as Consulting Physician (Internal Medicine)  CHIEF COMPLAINTS/PURPOSE OF CONSULTATION: ANEMIA  HEMATOLOGY HISTORY:  # AUG 2022:  SEVERE ANEMIA [hb-7; ]  EGD/Colonoscopy:2020- last colo-42mm [facility]; Dr.Toledo]; Esophagogram- 2022- NEG for stricture. ? capsule  # CKD stage III [Dr.Korrapti]; A.fib [on eliquis on 2.5 mg BID;  Dr.Kowalski]   HISTORY OF PRESENTING ILLNESS: Alone. Ambulating independently.  Leah [facility]er 82 y.o.  female iron deficient/macrocytic anemia of unclear etiology- / CKD-III; A-fib on Eliquis is here for follow-up.   Patient was recently seen in the hospital/emergency room for a fall-noted to have a worsening hemoglobin around 10.  Patient notes to have increasing shortness of breath/fatigue with exertion.  She is not on oral iron given history of constipation.  Denies any blood in stools or black-colored stools.  Review of Systems  Constitutional:  Positive for malaise/fatigue. Negative for chills, diaphoresis, fever and weight loss.  HENT:  Negative for nosebleeds and sore throat.   Eyes:  Negative for double vision.  Respiratory:  Negative for cough, hemoptysis, sputum production and wheezing.   Cardiovascular:  Negative for chest pain, palpitations, orthopnea and leg swelling.  Gastrointestinal:  Positive for constipation. Negative for blood in stool, heartburn, melena and vomiting.  Genitourinary:  Negative for dysuria, frequency and urgency.  Musculoskeletal:  Positive for back pain and joint pain.  Skin: Negative.  Negative for itching and rash.  Neurological:  Negative for tingling, focal weakness, weakness and headaches.  Endo/Heme/Allergies:  Does not bruise/bleed easily.  Psychiatric/Behavioral:  Negative for depression. The patient is not  nervous/anxious and does not have insomnia.     MEDICAL HISTORY:  Past Medical History:  Diagnosis Date   Abnormal liver function test 07/23/2019   Anemia    Aortic aneurysm (HCC)    "SMALL"   Arthritis    neck to tailbone   Bilateral carpal tunnel syndrome 12/08/2017   Bronchitis    Chronic cough    @ NIGHT/ SEASONAL ALLERGIES   Clostridium difficile infection 2013-2014   Colon polyp    Concussion    Coronary artery disease    Dr Gwen Pounds cardiologist   Depression    Diverticulosis    GERD (gastroesophageal reflux disease)    Glaucoma    Gout    Hemorrhoids    History of chicken pox    History of colonic polyps    History of shingles    Hypercholesterolemia    Hypertension    CONTROLLED ON MEDS   Hypovolemia 12/10   acute renal failure   IBS (irritable bowel syndrome)    Neuromuscular disorder (HCC)    occasionally tingling in hands/ NUMBNESS IN RIGHT FOOT s/p back surgery   Osteoarthritis    bilateral knees   Osteopenia    Pneumonia    HX OF   Sleep apnea    very mild   sleep study 8 yrs ago does not use CPAP   Urinary incontinence     SURGICAL HISTORY: Past Surgical History:  Procedure Laterality Date   ABDOMINAL HYSTERECTOMY  1979   ANTERIOR CERVICAL DECOMP/DISCECTOMY FUSION N/A 11/28/2014   Procedure: CERVICAL FIVE-SIX  ANTERIOR CERVICAL DECOMPRESSION/DISCECTOMY FUSION 1 LEVEL;  Surgeon: Tressie Stalker, MD;  Location: [facility];  Service: Neurosurgery;  Laterality: N/A;  C56 anterior cervical decompression with fusion interbody prosthesis plating and bonegraft  BACK SURGERY     X2/ L4,5 & S1/ Cervical C7?   BLADDER SURGERY  1981   SUSPENSION   BREAST REDUCTION SURGERY     CARDIOVERSION N/A 10/29/2020   Procedure: CARDIOVERSION;  Surgeon: Lamar Blinks, MD;  Location: [facility];  Service: Cardiovascular;  Laterality: N/A;   CARPAL TUNNEL RELEASE  2007   CATARACT EXTRACTION W/PHACO Right 12/30/2016   Procedure: CATARACT EXTRACTION PHACO AND  INTRAOCULAR LENS PLACEMENT (IOC)  Right toric lens;  Surgeon: Lockie Mola, MD;  Location: [facility];  Service: Ophthalmology;  Laterality: Right;  sleep apnea, does not use CPAP   CATARACT EXTRACTION W/PHACO Left 01/20/2017   Procedure: CATARACT EXTRACTION PHACO AND INTRAOCULAR LENS PLACEMENT (IOC)  Left Toric;  Surgeon: Lockie Mola, MD;  Location: [facility];  Service: Ophthalmology;  Laterality: Left;  toric lens   CHOLECYSTECTOMY  1996   COLONOSCOPY     COLONOSCOPY WITH PROPOFOL N/A 05/22/2019   Procedure: COLONOSCOPY WITH PROPOFOL;  Surgeon: Toledo, Boykin Nearing, MD;  Location: [facility];  Service: Gastroenterology;  Laterality: N/A;   FOOT SURGERY  2005   JOINT REPLACEMENT Bilateral    KNEE   LAMINECTOMY  1983 & 1985   REDUCTION MAMMAPLASTY Bilateral 2001   ROTATOR CUFF REPAIR  2000 & 2004   left and right   SPINAL CORD STIMULATOR IMPLANT  03/1997   SPINAL CORD STIMULATOR REMOVAL  2007   Tendon repair right shoulder     TONSILLECTOMY     TOTAL KNEE ARTHROPLASTY  10/2011 & 09/13   left and right    SOCIAL HISTORY: Social History   Socioeconomic History   Marital status: Widowed    Spouse name: Not on file   Number of children: 2   Years of education: Not on file   Highest education level: 12th grade  Occupational History   Not on file  Tobacco Use   Smoking status: Former    Packs/day: 1.00    Years: 20.00    Additional pack years: 0.00    Total pack years: 20.00    Types: Cigarettes    Quit date: 09/07/1988    Years since quitting: 34.3   Smokeless tobacco: Never  Vaping Use   Vaping Use: Never used  Substance and Sexual Activity   Alcohol use: Yes    Alcohol/week: 2.0 standard drinks of alcohol    Types: 2 Glasses of wine per week    Comment: occasional   Drug use: No   Sexual activity: Never  Other Topics Concern   Not on file  Social History Narrative   She is widowed, has two daughters. [place]; quit smoking- in 1990s;  social alcohol.    Social Determinants of Health   Financial Resource Strain: Low Risk  (01/08/2023)   Overall Financial Resource Strain (CARDIA)    Difficulty of Paying Living Expenses: Not hard at all  Food Insecurity: No Food Insecurity (01/12/2023)   Hunger Vital Sign    Worried About Running Out of Food in the Last Year: Never true    Ran Out of Food in the Last Year: Never true  Transportation Needs: No Transportation Needs (01/12/2023)   PRAPARE - Administrator, Civil Service (Medical): No    Lack of Transportation (Non-Medical): No  Physical Activity: Insufficiently Active (01/08/2023)   Exercise Vital Sign    Days of Exercise per Week: 4 days    Minutes of Exercise per Session: 20 min  Stress: No Stress Concern Present (  01/08/2023)   [place] [employer] - Occupational Stress Questionnaire    Feeling of Stress : Not at all  Social Connections: Moderately Integrated (01/08/2023)   Social Connection and Isolation Panel [NHANES]    Frequency of Communication with Friends and Family: More than three times a week    Frequency of Social Gatherings with Friends and Family: Twice a week    Attends Religious Services: More than 4 times per year    Active Member of [employer] or Organizations: Yes    Attends Banker Meetings: More than 4 times per year    Marital Status: Widowed  Intimate Partner Violence: Not At Risk (08/27/2022)   Humiliation, Afraid, Rape, and Kick questionnaire    Fear of Current or Ex-Partner: No    Emotionally Abused: No    Physically Abused: No    Sexually Abused: No    FAMILY HISTORY: Family History  Problem Relation Age of Onset   Heart disease Mother    Stroke Mother    Hypertension Mother    Hyperlipidemia Father    Heart disease Father    Heart disease Brother        2 brothers   Alzheimer's disease Brother    Heart disease Sister        pacemaker   Breast cancer Daughter 24    ALLERGIES:  is allergic to  clindamycin/lincomycin, dronedarone, bactrim [sulfamethoxazole-trimethoprim], morphine, morphine and codeine, and sulfa antibiotics.  MEDICATIONS:  Current Outpatient Medications  Medication Sig Dispense Refill   cyanocobalamin 1000 MCG tablet Take 1,000 mcg by mouth.     ELIQUIS 2.5 MG TABS tablet Take 2.5 mg by mouth 2 (two) times daily.     latanoprost (XALATAN) 0.005 % ophthalmic solution Place 1 drop into both eyes at bedtime.      Lifitegrast (XIIDRA) 5 % SOLN Place 1 drop into both eyes daily.     losartan (COZAAR) 25 MG tablet 25 mg daily.     metoprolol succinate (TOPROL-XL) 25 MG 24 hr tablet Take 25 mg by mouth in the morning and at bedtime.     pantoprazole (PROTONIX) 20 MG tablet One tablet qod 90 tablet 2   rosuvastatin (CRESTOR) 10 MG tablet TAKE 1 TABLET DAILY 90 tablet 3   tolterodine (DETROL LA) 4 MG 24 hr capsule Take 1 capsule (4 mg total) by mouth daily. 90 capsule 3   Tralokinumab-ldrm (ADBRY) 150 MG/ML SOSY Inject 2 mLs (300 mg total) into the skin every 14 (fourteen) days. Starting on day 15 for maintenance. 4 mL 4   triamcinolone ointment (KENALOG) 0.1 % Apply 1 Application topically daily as needed. Avoid face, groin and axilla 80 g 0   carbamide peroxide (DEBROX) 6.5 % OTIC solution 4-5 drops in left ear.  Massage for approximately 5 minutes daily. (Patient not taking: Reported on 01/20/2023) 15 mL 0   No current facility-administered medications for this visit.      PHYSICAL EXAMINATION:   Vitals:   01/20/23 1516  BP: (!) 128/57  Pulse: 81  Resp: 18  Temp: 97.8 F (36.6 C)  SpO2: 99%   There were no vitals filed for this visit.   Physical Exam Vitals and nursing note reviewed.  HENT:     Head: Normocephalic and atraumatic.     Mouth/Throat:     Pharynx: Oropharynx is clear.  Eyes:     Extraocular Movements: Extraocular movements intact.     Pupils: Pupils are equal, round, and reactive to light.  Cardiovascular:     Rate and Rhythm: Normal  rate and regular rhythm.  Pulmonary:     Comments: Decreased breath sounds bilaterally.  Abdominal:     Palpations: Abdomen is soft.  Musculoskeletal:        General: Normal range of motion.     Cervical back: Normal range of motion.  Skin:    General: Skin is warm.  Neurological:     General: No focal deficit present.     Mental Status: She is alert and oriented to person, place, and time.  Psychiatric:        Behavior: Behavior normal.        Judgment: Judgment normal.    LABORATORY DATA:  I have reviewed the data as listed Lab Results  Component Value Date   WBC 5.9 01/20/2023   HGB 10.8 (L) 01/20/2023   HCT 33.8 (L) 01/20/2023   MCV 109.4 (H) 01/20/2023   PLT 330 01/20/2023   Recent Labs    04/28/22 0924 06/08/22 1406 09/04/22 0851 01/06/23 1048 01/08/23 1051 01/20/23 1454  NA 137 139 141 136 140 138  K 5.1 4.8 4.3 4.3 4.7 4.5  CL 103 109 104 102 104 106  CO2 23 25 26 24 27  21*  GLUCOSE 91 105* 95 97 88 105*  BUN 29* 29* 24* 25* 22 31*  CREATININE 1.47* 1.39* 1.20 1.19* 1.16 1.35*  CALCIUM 9.7 9.5 9.7 9.6 9.4 9.5  GFRNONAA  --  38*  --  45*  --  39*  PROT 6.8  --  6.8 7.8 6.7  --   ALBUMIN 4.6  --  4.6 4.6 4.4  --   AST 19  --  19 31 22   --   ALT 10  --  10 14 11   --   ALKPHOS 53  --  51 59 55  --   BILITOT 0.6  --  0.5 0.7 0.5  --   BILIDIR 0.1  --  0.1  --  0.1  --      DG Wrist Complete Left  Result Date: 01/18/2023 CLINICAL DATA:  Fall EXAM: LEFT WRIST - COMPLETE 3+ VIEW COMPARISON:  Left wrist x-ray 01/06/2023 FINDINGS: There is no acute fracture or dislocation identified. There is radiocarpal joint space narrowing and calcification of the triangular fibrocartilage compatible with degenerative change, similar to prior. There also moderate degenerative changes of the first carpometacarpal joint, unchanged. There is mild soft tissue swelling surrounding the wrist. IMPRESSION: 1. No acute fracture or dislocation identified. 2. Mild soft tissue swelling  surrounding the wrist. 3. Degenerative changes of the radiocarpal and first carpometacarpal joints. Electronically Signed   By: Darliss Cheney M.D.   On: 01/18/2023 23:24   DG Wrist Complete Left  Result Date: 01/06/2023 CLINICAL DATA:  Wrist pain after fall last night EXAM: LEFT WRIST - COMPLETE 3+ VIEW COMPARISON:  None Available. FINDINGS: Severe degenerative changes seen involving the first carpometacarpal joint. Chondrocalcinosis is seen involving the triangular fibrocartilage. No acute fracture or dislocation is noted. IMPRESSION: Osteoarthritis of first carpometacarpal joint. No acute abnormality seen. Electronically Signed   By: Lupita Raider M.D.   On: 01/06/2023 11:56   DG Chest 2 View  Result Date: 01/06/2023 CLINICAL DATA:  Shortness of breath. Weakness and fall. Hypotension. EXAM: CHEST - 2 VIEW COMPARISON:  CT angio chest 04/04/2018 FINDINGS: The heart is mildly enlarged. Atherosclerotic calcifications are present at the aortic arch. The lungs are clear. No edema or effusion is present. No  focal airspace disease is present. Postoperative changes are noted in the cervical spine. Surgical clips are present at the gallbladder fossa. The visualized soft tissues and bony thorax are unremarkable. IMPRESSION: 1. Mild cardiomegaly without failure. 2. No acute cardiopulmonary disease. Electronically Signed   By: Marin Roberts M.D.   On: 01/06/2023 10:55   DG Hand Complete Left  Result Date: 01/06/2023 CLINICAL DATA:  Weakness.  Fall.  Hand swelling EXAM: LEFT HAND - COMPLETE 3 VIEW COMPARISON:  None Available. FINDINGS: Severe osteopenia. No fracture or dislocation. Chondrocalcinosis about the wrist and at the third metacarpophalangeal joints. Moderate degenerative changes of the first carpometacarpal joint with sclerosis, joint space loss and hypertrophic change. Mild degenerative changes elsewhere. IMPRESSION: Osteopenia with chondrocalcinosis and degenerative changes. Electronically Signed    By: Karen Kays M.D.   On: 01/06/2023 10:50   CT HEAD WO CONTRAST  Result Date: 01/06/2023 CLINICAL DATA:  Head trauma. Fall last night. Dizziness proceeded fall. Supraorbital scalp laceration. EXAM: CT HEAD WITHOUT CONTRAST TECHNIQUE: Contiguous axial images were obtained from the base of the skull through the vertex without intravenous contrast. RADIATION DOSE REDUCTION: This exam was performed according to the departmental dose-optimization program which includes automated exposure control, adjustment of the mA and/or kV according to patient size and/or use of iterative reconstruction technique. COMPARISON:  CT head without contrast 03/04/2017. FINDINGS: Brain: Moderate generalized atrophy and white matter disease is present. No acute infarct, hemorrhage, or mass lesion is present. Deep brain nuclei are within normal limits. The ventricles are of normal size. No significant extraaxial fluid collection is present. The brainstem and cerebellum are within normal limits. Midline structures are within normal limits. Vascular: Atherosclerotic calcifications are again noted within the cavernous internal carotid arteries. No hyperdense vessel is present. Skull: Calvarium is intact. No focal lytic or blastic lesions are present. A left supraorbital scalp laceration is present. No underlying fracture or foreign body is present. Sinuses/Orbits: Posterior left ethmoid air cells are opacified. Bilateral maxillary antrostomies are noted. The remaining paranasal sinuses are clear. Bilateral lens replacements are noted. Globes and orbits are otherwise unremarkable. IMPRESSION: 1. Left supraorbital scalp laceration without underlying fracture or foreign body. 2. Stable moderate generalized atrophy and white matter disease. This likely reflects the sequela of chronic microvascular ischemia. 3. No acute intracranial abnormality or significant interval change. Electronically Signed   By: Marin Roberts M.D.   On:  01/06/2023 10:44    Iron deficiency #Anemia-iron deficiency; - nadir July 2022- Hemoglobin improved from 7 to 12.  INTOLERANCE PO gentle iron/ constipation [needing enema].  Proceed with iron infusion today.   # Macrocytosis- ? unclear etiology- B12 JAN 2024- > 1000; recommend qOD on b12 pills;  / CKD vs others. hold off bone marrow biopsy.-  # Etiology of iron deficiency: [Oct 2022] s/p [facility] evaluation; s/p  barium esophagogram.  #A. fib [Dr.Thomas; [facility] ]-on Eliquis 2.5 mg twice daily- s/p ablation [[facility]-Oct 2022]-stable.   # CKD- Stage III- GFR 46- stable. [Dr.korrapati]  # DISPOSITION: # venofer today;  # follow up in 4 month-  MD; labs- cbc/bmp/iron studies/ferritin; LDH;B12 level- Possible venofer-Dr.B  All questions were answered. The patient knows to call the clinic with any problems, questions or concerns.    Earna Coder, MD 01/20/2023 3:46 PM

## 2023-01-20 NOTE — Addendum Note (Signed)
Addended by: Clydia Llano on: 01/20/2023 03:51 PM   Modules accepted: Orders

## 2023-01-20 NOTE — Patient Instructions (Signed)

## 2023-01-20 NOTE — Progress Notes (Signed)
Pt had a fall and sprained her left wrist. Dyspnea with ADL's. Denies dizziness. Appetite is good. No visible blood in stool.

## 2023-01-20 NOTE — Progress Notes (Signed)
Fatigue/weakness: Dyspena: Light headedness: Blood in stool:  

## 2023-01-20 NOTE — Assessment & Plan Note (Addendum)
#  Anemia-iron deficiency; - nadir July 2022- Hemoglobin improved from 7 to 12.  INTOLERANCE PO gentle iron/ constipation [needing enema].  Proceed with iron infusion today.   # Macrocytosis- ? unclear etiology- B12 JAN 2024- > 1000; recommend qOD on b12 pills;  / CKD vs others. hold off bone marrow biopsy.-  # Etiology of iron deficiency: [Oct 2022] s/p [facility] evaluation; s/p  barium esophagogram.  #A. fib [Dr.Thomas; [facility] ]-on Eliquis 2.5 mg twice daily- s/p ablation [[facility]-Oct 2022]-stable.   # CKD- Stage III- GFR 46- stable. [Dr.korrapati]  # DISPOSITION: # venofer today;  # follow up in 4 month-  MD; labs- cbc/bmp/iron studies/ferritin; LDH;B12 level- Possible venofer-Dr.B

## 2023-01-21 DIAGNOSIS — N1831 Chronic kidney disease, stage 3a: Secondary | ICD-10-CM | POA: Diagnosis not present

## 2023-01-21 DIAGNOSIS — D509 Iron deficiency anemia, unspecified: Secondary | ICD-10-CM | POA: Diagnosis not present

## 2023-01-21 DIAGNOSIS — Z7901 Long term (current) use of anticoagulants: Secondary | ICD-10-CM | POA: Diagnosis not present

## 2023-01-21 DIAGNOSIS — I071 Rheumatic tricuspid insufficiency: Secondary | ICD-10-CM | POA: Diagnosis not present

## 2023-01-21 DIAGNOSIS — I480 Paroxysmal atrial fibrillation: Secondary | ICD-10-CM | POA: Diagnosis not present

## 2023-01-21 DIAGNOSIS — E785 Hyperlipidemia, unspecified: Secondary | ICD-10-CM | POA: Diagnosis not present

## 2023-01-21 DIAGNOSIS — I340 Nonrheumatic mitral (valve) insufficiency: Secondary | ICD-10-CM | POA: Diagnosis not present

## 2023-01-21 DIAGNOSIS — Z8679 Personal history of other diseases of the circulatory system: Secondary | ICD-10-CM | POA: Diagnosis not present

## 2023-01-21 DIAGNOSIS — I517 Cardiomegaly: Secondary | ICD-10-CM | POA: Diagnosis not present

## 2023-01-21 DIAGNOSIS — I2510 Atherosclerotic heart disease of native coronary artery without angina pectoris: Secondary | ICD-10-CM | POA: Diagnosis not present

## 2023-01-21 DIAGNOSIS — Z9889 Other specified postprocedural states: Secondary | ICD-10-CM | POA: Diagnosis not present

## 2023-01-21 DIAGNOSIS — I77810 Thoracic aortic ectasia: Secondary | ICD-10-CM | POA: Diagnosis not present

## 2023-01-28 ENCOUNTER — Ambulatory Visit (INDEPENDENT_AMBULATORY_CARE_PROVIDER_SITE_OTHER): Payer: Medicare Other

## 2023-01-28 DIAGNOSIS — L209 Atopic dermatitis, unspecified: Secondary | ICD-10-CM

## 2023-01-28 MED ORDER — TRALOKINUMAB-LDRM 150 MG/ML ~~LOC~~ SOSY
300.0000 mg | PREFILLED_SYRINGE | Freq: Once | SUBCUTANEOUS | Status: AC
Start: 2023-01-28 — End: 2023-01-28
  Administered 2023-01-28: 300 mg via SUBCUTANEOUS

## 2023-01-28 NOTE — Progress Notes (Signed)
Patient here today for Adbry 2 week injection for Severe Atopic Dermatitis.    Patient injected today with Adbry 150mg  X2 into the right Upper Arm. Patient tolerated procedure well.    Lot: [id number] Exp: 01/2024 NDC:50222-346-22   Dorathy Daft, RMA

## 2023-02-04 DIAGNOSIS — I480 Paroxysmal atrial fibrillation: Secondary | ICD-10-CM | POA: Diagnosis not present

## 2023-02-11 ENCOUNTER — Ambulatory Visit (INDEPENDENT_AMBULATORY_CARE_PROVIDER_SITE_OTHER): Payer: Medicare Other

## 2023-02-11 DIAGNOSIS — L209 Atopic dermatitis, unspecified: Secondary | ICD-10-CM | POA: Diagnosis not present

## 2023-02-11 MED ORDER — TRALOKINUMAB-LDRM 150 MG/ML ~~LOC~~ SOSY
300.0000 mg | PREFILLED_SYRINGE | Freq: Once | SUBCUTANEOUS | Status: AC
Start: 2023-02-11 — End: ?

## 2023-02-11 NOTE — Progress Notes (Addendum)
Patient here today for Adbry 2 week injection for Severe Atopic Dermatitis.    Patient injected today with Adbry 150mg  X2 into the left Upper Arm. Patient tolerated procedure well.    Lot: [id number] Exp: 01/2024 NDC:50222-346-22   Dorathy Daft, RMA

## 2023-02-25 ENCOUNTER — Ambulatory Visit: Payer: Medicare Other | Admitting: Dermatology

## 2023-03-01 DIAGNOSIS — N281 Cyst of kidney, acquired: Secondary | ICD-10-CM | POA: Diagnosis not present

## 2023-03-01 DIAGNOSIS — D631 Anemia in chronic kidney disease: Secondary | ICD-10-CM | POA: Diagnosis not present

## 2023-03-01 DIAGNOSIS — E7800 Pure hypercholesterolemia, unspecified: Secondary | ICD-10-CM | POA: Diagnosis not present

## 2023-03-01 DIAGNOSIS — N1832 Chronic kidney disease, stage 3b: Secondary | ICD-10-CM | POA: Diagnosis not present

## 2023-03-01 DIAGNOSIS — U071 COVID-19: Secondary | ICD-10-CM | POA: Diagnosis not present

## 2023-03-01 DIAGNOSIS — N2581 Secondary hyperparathyroidism of renal origin: Secondary | ICD-10-CM | POA: Diagnosis not present

## 2023-03-01 DIAGNOSIS — I10 Essential (primary) hypertension: Secondary | ICD-10-CM | POA: Diagnosis not present

## 2023-03-01 DIAGNOSIS — R809 Proteinuria, unspecified: Secondary | ICD-10-CM | POA: Diagnosis not present

## 2023-03-02 ENCOUNTER — Ambulatory Visit: Payer: Medicare Other | Admitting: Dermatology

## 2023-03-03 DIAGNOSIS — N2581 Secondary hyperparathyroidism of renal origin: Secondary | ICD-10-CM | POA: Diagnosis not present

## 2023-03-03 DIAGNOSIS — E7800 Pure hypercholesterolemia, unspecified: Secondary | ICD-10-CM | POA: Diagnosis not present

## 2023-03-03 DIAGNOSIS — N281 Cyst of kidney, acquired: Secondary | ICD-10-CM | POA: Diagnosis not present

## 2023-03-03 DIAGNOSIS — D631 Anemia in chronic kidney disease: Secondary | ICD-10-CM | POA: Diagnosis not present

## 2023-03-03 DIAGNOSIS — I10 Essential (primary) hypertension: Secondary | ICD-10-CM | POA: Diagnosis not present

## 2023-03-03 DIAGNOSIS — N1832 Chronic kidney disease, stage 3b: Secondary | ICD-10-CM | POA: Diagnosis not present

## 2023-03-03 DIAGNOSIS — R809 Proteinuria, unspecified: Secondary | ICD-10-CM | POA: Diagnosis not present

## 2023-03-10 ENCOUNTER — Ambulatory Visit: Payer: Medicare Other | Admitting: Dermatology

## 2023-03-17 ENCOUNTER — Encounter: Payer: Self-pay | Admitting: Dermatology

## 2023-03-17 ENCOUNTER — Ambulatory Visit (INDEPENDENT_AMBULATORY_CARE_PROVIDER_SITE_OTHER): Payer: Medicare Other | Admitting: Dermatology

## 2023-03-17 VITALS — BP 127/80

## 2023-03-17 DIAGNOSIS — L209 Atopic dermatitis, unspecified: Secondary | ICD-10-CM

## 2023-03-17 DIAGNOSIS — L659 Nonscarring hair loss, unspecified: Secondary | ICD-10-CM

## 2023-03-17 DIAGNOSIS — Z79899 Other long term (current) drug therapy: Secondary | ICD-10-CM | POA: Diagnosis not present

## 2023-03-17 DIAGNOSIS — L649 Androgenic alopecia, unspecified: Secondary | ICD-10-CM

## 2023-03-17 MED ORDER — DUPILUMAB 300 MG/2ML ~~LOC~~ SOSY
300.0000 mg | PREFILLED_SYRINGE | Freq: Once | SUBCUTANEOUS | Status: AC
Start: 2023-03-17 — End: 2023-03-17
  Administered 2023-03-17: 300 mg via SUBCUTANEOUS

## 2023-03-17 MED ORDER — FINASTERIDE 5 MG PO TABS
5.0000 mg | ORAL_TABLET | Freq: Every day | ORAL | 1 refills | Status: DC
Start: 2023-03-17 — End: 2023-08-03

## 2023-03-17 MED ORDER — MINOXIDIL 2.5 MG PO TABS
2.5000 mg | ORAL_TABLET | Freq: Every day | ORAL | 1 refills | Status: DC
Start: 2023-03-17 — End: 2023-08-09

## 2023-03-17 NOTE — Patient Instructions (Addendum)
Dupilumab (Dupixent) is a treatment given by injection for adults and children with moderate-to-severe atopic dermatitis. Goal is control of skin condition, not cure. It is given as 2 injections at the first dose followed by 1 injection ever 2 weeks thereafter.  Young children are dosed monthly.  Potential side effects include allergic reaction, herpes infections, injection site reactions and conjunctivitis (inflammation of the eyes).  The use of Dupixent requires long term medication management, including periodic office visits.  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 Condition is symptomatic/ bothersome to patient. Not currently at goal.   Female Androgenic Alopecia is a chronic condition related to genetics and/or hormonal changes.  In women androgenetic alopecia is commonly associated with menopause but may occur any time after puberty.  It causes hair thinning primarily on the crown with widening of the part and temporal hairline recession.  Can use OTC Rogaine (minoxidil) 5% solution/foam as directed.  Oral treatments in female patients who have no contraindication may include : - Low dose oral minoxidil 1.25 - 5mg  daily - Spironolactone 50 - 100mg  bid - Finasteride 2.5 - 5 mg daily Adjunctive therapies include: - Low Level Laser Light Therapy (LLLT) - Platelet-rich plasma injections (PRP) - Hair Transplants or scalp reduction   Treatment Plan: Start minoxidil 2.5 mg taking 1/2 tablets once daily.  Start finasteride 5 mg taking 1/2 tablet daily.  May increase to full tablet of both at 1 month if no side effects  Doses of minoxidil for hair loss are considered 'low dose'. This is because the doses used for hair loss are much lower than the doses which are used for conditions such as high  blood pressure (hypertension). The doses used for hypertension are 10-40mg  per day.  Side effects are uncommon at the low doses (up to 2.5 mg/day) used to treat hair loss. Potential side effects, more commonly seen at higher doses, include: Increase in hair growth (hypertrichosis) elsewhere on face and body Temporary hair shedding upon starting medication which may last up to 4 weeks Ankle swelling, fluid retention, rapid weight gain more than 5 pounds Low blood pressure and feeling lightheaded or dizzy when standing up quickly Fast or irregular heartbeat Headaches   Due to recent changes in healthcare laws, you may see results of your pathology and/or laboratory studies on MyChart before the doctors have had a chance to review them. We understand that in some cases there may be results that are confusing or concerning to you. Please understand that not all results are received at the same time and often the doctors may need to interpret multiple results in order to provide you with the best plan of care or course of treatment. Therefore, we ask that you please give [place] 2 business days to thoroughly review all your results before contacting the office for clarification. Should we see a critical lab result, you will be contacted sooner.   If You Need Anything After Your Visit  If you have any questions or concerns for your doctor, please call our main line at [phone] and press option 4 to reach your doctor's medical assistant. If no one answers, please leave a voicemail as directed and we will return your call as soon as possible. Messages left after 4 pm will be answered the following business day.   You may  also send [place] a message via MyChart. We typically respond to MyChart messages within 1-2 business days.  For prescription refills, please ask your pharmacy to contact our office. Our fax number is [phone].  If you have an urgent issue when the clinic is closed that cannot wait until  the next business day, you can page your doctor at the number below.    Please note that while we do our best to be available for urgent issues outside of office hours, we are not available 24/7.   If you have an urgent issue and are unable to reach [place], you may choose to seek medical care at your doctor's office, retail clinic, urgent care center, or emergency room.  If you have a medical emergency, please immediately call 911 or go to the emergency department.  Pager Numbers  - Dr. Gwen Pounds: [phone]  - Dr. Neale Burly: [phone]  - Dr. Roseanne Reno: [phone]  In the event of inclement weather, please call our main line at [phone] for an update on the status of any delays or closures.  Dermatology Medication Tips: Please keep the boxes that topical medications come in in order to help keep track of the instructions about where and how to use these. Pharmacies typically print the medication instructions only on the boxes and not directly on the medication tubes.   If your medication is too expensive, please contact our office at [phone] option 4 or send [place] a message through MyChart.   We are unable to tell what your co-pay for medications will be in advance as this is different depending on your insurance coverage. However, we may be able to find a substitute medication at lower cost or fill out paperwork to get insurance to cover a needed medication.   If a prior authorization is required to get your medication covered by your insurance company, please allow [place] 1-2 business days to complete this process.  Drug prices often vary depending on where the prescription is filled and some pharmacies may offer cheaper prices.  The website www.goodrx.com contains coupons for medications through different pharmacies. The prices here do not account for what the cost may be with help from insurance (it may be cheaper with your insurance), but the website can give you the price if you did  not use any insurance.  - You can print the associated coupon and take it with your prescription to the pharmacy.  - You may also stop by our office during regular business hours and pick up a GoodRx coupon card.  - If you need your prescription sent electronically to a different pharmacy, notify our office through [facility] or by phone at [phone] option 4.

## 2023-03-17 NOTE — Progress Notes (Signed)
Follow-Up Visit   Subjective  Leah Huber is a 82 y.o. female who presents for the following: atopic dermatitis follow up. Patient was most recently on Adbry and did not notice any difference then when she was on Dupixent. Her last Adbry injection was 02/11/23. Topically she is only using CeraVe. Patient advises overall itching has improved since starting biologics, but she is still itching.  She wants to go back on Dupixent.  She also c/o hair loss that got worse after having Covid last year. No h/o breast cancer.  She has been on Dupixent ~ 2 years, with a ~ year break in between.  No side effects, eyes feel fine. 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   The following portions of the chart were reviewed this encounter and updated as appropriate: medications, allergies, medical history  Review of Systems:  No other skin or systemic complaints except as noted in HPI or Assessment and Plan.  Objective  Well appearing patient in no apparent distress; mood and affect are within normal limits.   A focused examination was performed of the following areas: Arms, legs, back  Relevant exam findings are noted in the Assessment and Plan.  Scalp Diffuse thinning of the crown and widening of the midline part with retention of the frontal hairline - Reviewed progressive nature and prognosis.                Assessment & Plan   ATOPIC DERMATITIS Exam: pink scaly papules at right flank, right pretibia, excoriated papules at right leg  Chronic and persistent condition with duration or expected duration over one year. Condition is symptomatic/ bothersome to patient. Not currently at goal.   Atopic dermatitis - Severe, on Dupixent (biologic medication).  Atopic dermatitis (eczema) is a chronic, relapsing, pruritic condition that can significantly affect quality of life. It is often associated with allergic rhinitis and/or asthma and can require treatment with topical medications,  phototherapy, or in severe cases biologic medications, which require long term medication management.    Treatment Plan: Restart Dupixent 300mg /89mL today and continue SQ Q2 wks. Dupixent 300 mg/34mL injected today to patient's right upper arm. Patient tolerated well. Lot # [id number]  Exp: 04/26 Once clear, can consider stopping Dupixent if patient would like to do a trial without it and see how she does.   Dupilumab (Dupixent) is a treatment given by injection for adults and children with moderate-to-severe atopic dermatitis. Goal is control of skin condition, not cure. It is given as 2 injections at the first dose followed by 1 injection ever 2 weeks thereafter.  Young children are dosed monthly.  Potential side effects include allergic reaction, herpes infections, injection site reactions and conjunctivitis (inflammation of the eyes).  The use of Dupixent requires long term medication management, including periodic office visits.  Long term medication management.  Patient is using long term (months to years) prescription medication  to control their dermatologic condition.  These medications require periodic monitoring to evaluate for efficacy and side effects and may require periodic laboratory monitoring.   Recommend gentle skin care.  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 Condition is symptomatic/ bothersome to patient. Not currently at goal.   Female Androgenic Alopecia is a chronic  condition related to genetics and/or hormonal changes.  In women androgenetic alopecia is commonly associated with menopause but may occur any time after puberty.  It causes hair thinning primarily on the crown with  widening of the part and temporal hairline recession.  Can use OTC Rogaine (minoxidil) 5% solution/foam as directed.  Oral treatments in female patients who have no contraindication may include : - Low dose oral minoxidil 1.25 - 5mg  daily - Spironolactone 50 - 100mg  bid - Finasteride 2.5 - 5 mg daily Adjunctive therapies include: - Low Level Laser Light Therapy (LLLT) - Platelet-rich plasma injections (PRP) - Hair Transplants or scalp reduction   Treatment Plan: Start minoxidil 2.5 mg taking 1/2 tablet once daily.  Start finasteride 5 mg taking 1/2 tablet daily.  May increase to full tablet of both at 1 month if no side effects  12/23 TSH normal 01/20/23 Ferritin in low at 20.  Should be at least 50 for optimal hair growth  Recommend iron supplement if not already taking. Start ferrous sulfate 325 mg by mouth every other day with orange juice or vitamin C to help promote absorption.  Avoid taking with dairy (Calcium) or mineral-containing supplements which may decrease absorption.  The side effects of iron are primarily GI in type, such as cramping, constipation, and black stools.   In 2 mos would repeat ferritin, and add Vit D hydroxy level.  If she has already been taking iron supplement can repeat labs now.  Doses of minoxidil for hair loss are considered 'low dose'. This is because the doses used for hair loss are much lower than the doses which are used for conditions such as high blood pressure (hypertension). The doses used for hypertension are 10-40mg  per day.  Side effects are uncommon at the low doses (up to 2.5 mg/day) used to treat hair loss. Potential side effects, more commonly seen at higher doses, include: Increase in hair growth (hypertrichosis) elsewhere on face and body Temporary hair shedding upon starting medication which may last up to 4 weeks Ankle swelling, fluid retention, rapid weight gain more than 5 pounds Low blood pressure and feeling lightheaded or dizzy when  standing up quickly Fast or irregular heartbeat Headaches     minoxidil (LONITEN) 2.5 MG tablet - Scalp Take 1 tablet (2.5 mg total) by mouth daily.  finasteride (PROSCAR) 5 MG tablet - Scalp Take 1 tablet (5 mg total) by mouth daily.  Atopic dermatitis, unspecified type  Related Medications Tralokinumab-ldrm (ADBRY) 150 MG/ML SOSY Inject 2 mLs (300 mg total) into the skin every 14 (fourteen) days. Starting on day 15 for maintenance.  Tralokinumab-ldrm SOSY 300 mg   dupilumab (DUPIXENT) prefilled syringe 300 mg     Return in about 2 weeks (around 03/31/2023) for Dupixent with nurse, Dr. Roseanne Reno 3-6 months.  Anise Salvo, RMA, am acting as scribe for Willeen Niece, MD .   Documentation: I have reviewed the above documentation for accuracy and completeness, and I agree with the above.  Willeen Niece, MD

## 2023-03-18 ENCOUNTER — Encounter: Payer: Self-pay | Admitting: Internal Medicine

## 2023-03-18 ENCOUNTER — Telehealth: Payer: Self-pay

## 2023-03-18 DIAGNOSIS — N183 Chronic kidney disease, stage 3 unspecified: Secondary | ICD-10-CM

## 2023-03-18 NOTE — Telephone Encounter (Signed)
Patient called discussed labs from 01/20/23 showed low ferritin- patient report she [facility] and he is keeping check on her ferritin levels, discussed with patient she should add Vit D hydroxy level.

## 2023-03-18 NOTE — Telephone Encounter (Signed)
Please call and find out if the amount of vitamin D she is taking - was recommended by dermatology.  I do not see a vitamin D level.  If she has had one drawn need results.

## 2023-03-19 NOTE — Telephone Encounter (Signed)
Ok to schedule vitamin D level - dx ckd 3.

## 2023-03-19 NOTE — Telephone Encounter (Signed)
Patient is not currently taking Vitamin D. Do you want to me to order a Vit D level for her to have drawn here to determine how much Vit D she should take?

## 2023-03-22 NOTE — Telephone Encounter (Signed)
Vitamin D level scheduled and ordered. Patient is aware.

## 2023-03-26 ENCOUNTER — Other Ambulatory Visit (INDEPENDENT_AMBULATORY_CARE_PROVIDER_SITE_OTHER): Payer: Medicare Other

## 2023-03-26 DIAGNOSIS — N183 Chronic kidney disease, stage 3 unspecified: Secondary | ICD-10-CM | POA: Diagnosis not present

## 2023-03-26 NOTE — Addendum Note (Signed)
Addended by: Warden Fillers on: 03/26/2023 10:36 AM   Modules accepted: Orders

## 2023-03-27 LAB — VITAMIN D 25 HYDROXY (VIT D DEFICIENCY, FRACTURES): Vit D, 25-Hydroxy: 24 ng/mL — ABNORMAL LOW (ref 30–100)

## 2023-03-31 ENCOUNTER — Ambulatory Visit (INDEPENDENT_AMBULATORY_CARE_PROVIDER_SITE_OTHER): Payer: Medicare Other

## 2023-03-31 DIAGNOSIS — L209 Atopic dermatitis, unspecified: Secondary | ICD-10-CM | POA: Diagnosis not present

## 2023-03-31 MED ORDER — DUPILUMAB 300 MG/2ML ~~LOC~~ SOSY
300.0000 mg | PREFILLED_SYRINGE | Freq: Once | SUBCUTANEOUS | Status: AC
Start: 2023-03-31 — End: 2023-03-31
  Administered 2023-03-31: 300 mg via SUBCUTANEOUS

## 2023-03-31 NOTE — Progress Notes (Signed)
Patient here today for 2 week injection for Severe Atopic Dermatitis.    Patient injected today with Dupixent 300mg  into the left Upper Arm. Patient tolerated procedure well.      Lot: [id number] Exp: 11/05/2024 [id number]   Dorathy Daft, RMA

## 2023-04-01 DIAGNOSIS — K224 Dyskinesia of esophagus: Secondary | ICD-10-CM | POA: Diagnosis not present

## 2023-04-01 DIAGNOSIS — Z86010 Personal history of colonic polyps: Secondary | ICD-10-CM | POA: Diagnosis not present

## 2023-04-01 DIAGNOSIS — D509 Iron deficiency anemia, unspecified: Secondary | ICD-10-CM | POA: Diagnosis not present

## 2023-04-01 DIAGNOSIS — D638 Anemia in other chronic diseases classified elsewhere: Secondary | ICD-10-CM | POA: Diagnosis not present

## 2023-04-08 ENCOUNTER — Telehealth: Payer: Self-pay

## 2023-04-08 DIAGNOSIS — L209 Atopic dermatitis, unspecified: Secondary | ICD-10-CM

## 2023-04-08 MED ORDER — DUPILUMAB 300 MG/2ML ~~LOC~~ SOSY
300.0000 mg | PREFILLED_SYRINGE | SUBCUTANEOUS | Status: AC
Start: 2023-04-08 — End: 2023-07-01

## 2023-04-08 NOTE — Telephone Encounter (Signed)
Okay to enter orders for Dupixent every two weeks until follow up with you in October?

## 2023-04-09 ENCOUNTER — Ambulatory Visit: Payer: Medicare Other

## 2023-04-09 ENCOUNTER — Other Ambulatory Visit: Payer: Medicare Other

## 2023-04-09 ENCOUNTER — Ambulatory Visit: Payer: Medicare Other | Admitting: Internal Medicine

## 2023-04-14 ENCOUNTER — Ambulatory Visit: Payer: Medicare Other

## 2023-04-15 ENCOUNTER — Ambulatory Visit: Payer: Medicare Other

## 2023-04-19 ENCOUNTER — Telehealth: Payer: Self-pay

## 2023-04-19 NOTE — Telephone Encounter (Signed)
Patient picked up sample of Dupixent to self inject into right arm.   LOT: [id number] EXP: 04/2025

## 2023-04-22 ENCOUNTER — Encounter (INDEPENDENT_AMBULATORY_CARE_PROVIDER_SITE_OTHER): Payer: Self-pay

## 2023-05-11 ENCOUNTER — Other Ambulatory Visit (INDEPENDENT_AMBULATORY_CARE_PROVIDER_SITE_OTHER): Payer: Medicare Other

## 2023-05-11 DIAGNOSIS — I10 Essential (primary) hypertension: Secondary | ICD-10-CM | POA: Diagnosis not present

## 2023-05-11 DIAGNOSIS — E7800 Pure hypercholesterolemia, unspecified: Secondary | ICD-10-CM

## 2023-05-11 LAB — LIPID PANEL
Cholesterol: 175 mg/dL (ref 0–200)
HDL: 90.4 mg/dL (ref >39.00)
LDL Cholesterol: 60 mg/dL (ref 0–99)
NonHDL: 84.44
Total CHOL/HDL Ratio: 2
Triglycerides: 120 mg/dL (ref 0.0–149.0)
VLDL: 24 mg/dL (ref 0.0–40.0)

## 2023-05-11 LAB — HEPATIC FUNCTION PANEL
ALT: 10 U/L (ref 0–35)
AST: 19 U/L (ref 0–37)
Albumin: 4.3 g/dL (ref 3.5–5.2)
Alkaline Phosphatase: 57 U/L (ref 39–117)
Bilirubin, Direct: 0.1 mg/dL (ref 0.0–0.3)
Total Bilirubin: 0.6 mg/dL (ref 0.2–1.2)
Total Protein: 7 g/dL (ref 6.0–8.3)

## 2023-05-11 LAB — BASIC METABOLIC PANEL
BUN: 22 mg/dL (ref 6–23)
CO2: 25 meq/L (ref 19–32)
Calcium: 9.3 mg/dL (ref 8.4–10.5)
Chloride: 105 meq/L (ref 96–112)
Creatinine, Ser: 1.13 mg/dL (ref 0.40–1.20)
GFR: 45.01 mL/min — ABNORMAL LOW (ref >60.00)
Glucose, Bld: 86 mg/dL (ref 70–99)
Potassium: 4.6 meq/L (ref 3.5–5.1)
Sodium: 139 meq/L (ref 135–145)

## 2023-05-14 ENCOUNTER — Ambulatory Visit (INDEPENDENT_AMBULATORY_CARE_PROVIDER_SITE_OTHER): Payer: Medicare Other | Admitting: Internal Medicine

## 2023-05-14 ENCOUNTER — Encounter: Payer: Self-pay | Admitting: Internal Medicine

## 2023-05-14 VITALS — BP 128/72 | HR 92 | Temp 98.1°F | Ht 64.0 in | Wt 147.2 lb

## 2023-05-14 DIAGNOSIS — F439 Reaction to severe stress, unspecified: Secondary | ICD-10-CM

## 2023-05-14 DIAGNOSIS — N1832 Chronic kidney disease, stage 3b: Secondary | ICD-10-CM | POA: Diagnosis not present

## 2023-05-14 DIAGNOSIS — K219 Gastro-esophageal reflux disease without esophagitis: Secondary | ICD-10-CM

## 2023-05-14 DIAGNOSIS — I712 Thoracic aortic aneurysm, without rupture, unspecified: Secondary | ICD-10-CM | POA: Diagnosis not present

## 2023-05-14 DIAGNOSIS — I10 Essential (primary) hypertension: Secondary | ICD-10-CM | POA: Diagnosis not present

## 2023-05-14 DIAGNOSIS — Z86010 Personal history of colonic polyps: Secondary | ICD-10-CM

## 2023-05-14 DIAGNOSIS — D649 Anemia, unspecified: Secondary | ICD-10-CM

## 2023-05-14 DIAGNOSIS — R0602 Shortness of breath: Secondary | ICD-10-CM

## 2023-05-14 DIAGNOSIS — I4891 Unspecified atrial fibrillation: Secondary | ICD-10-CM | POA: Diagnosis not present

## 2023-05-14 DIAGNOSIS — D692 Other nonthrombocytopenic purpura: Secondary | ICD-10-CM

## 2023-05-14 DIAGNOSIS — Z0000 Encounter for general adult medical examination without abnormal findings: Secondary | ICD-10-CM

## 2023-05-14 DIAGNOSIS — E7800 Pure hypercholesterolemia, unspecified: Secondary | ICD-10-CM | POA: Diagnosis not present

## 2023-05-14 NOTE — Progress Notes (Unsigned)
Subjective:    Patient ID: Leah Huber, female    DOB: 17-Mar-1941, 82 y.o.   MRN: [id number]  Patient here for  Chief Complaint  Patient presents with   Annual Exam    HPI With past history of hypercholesterolemia, hypertension, afib and anemia.  Comes in today to follow up on these issues as well as for a complete physical exam.  Is followed by hematology for IDA.  Receiving iron infusions.  Had f/u with GI 04/01/23. Recommended continuing protonix.  Had f/u with nephrology 03/03/23 - stable. Saw cardiology 01/21/23 - recommended continuing on toprol, eliquis.  Recommended echo. S/p echo 01/2023 - moderate TR, mild MR, PFO with Lto R shunting, mild to moderate PH with EF >55%. Discuss with cardiology regarding further evaluation.  She reports some sob with exertion.  Overall feels is stable.  No new symptoms.  No chest pain.  No increased cough or congestion.  No abdominal pain.  Bowels moving.     Past Medical History:  Diagnosis Date   Abnormal liver function test 07/23/2019   Anemia    Aortic aneurysm (HCC)    "SMALL"   Arthritis    neck to tailbone   Bilateral carpal tunnel syndrome 12/08/2017   Bronchitis    Chronic cough    @ NIGHT/ SEASONAL ALLERGIES   Clostridium difficile infection 2013-2014   Colon polyp    Concussion    Coronary artery disease    Dr Gwen Pounds cardiologist   Depression    Diverticulosis    GERD (gastroesophageal reflux disease)    Glaucoma    Gout    Hemorrhoids    History of chicken pox    History of colonic polyps    History of shingles    Hypercholesterolemia    Hypertension    CONTROLLED ON MEDS   Hypovolemia 12/10   acute renal failure   IBS (irritable bowel syndrome)    Neuromuscular disorder (HCC)    occasionally tingling in hands/ NUMBNESS IN RIGHT FOOT s/p back surgery   Osteoarthritis    bilateral knees   Osteopenia    Pneumonia    HX OF   Sleep apnea    very mild   sleep study 8 yrs ago does not use CPAP   Urinary  incontinence    Past Surgical History:  Procedure Laterality Date   ABDOMINAL HYSTERECTOMY  1979   ANTERIOR CERVICAL DECOMP/DISCECTOMY FUSION N/A 11/28/2014   Procedure: CERVICAL FIVE-SIX  ANTERIOR CERVICAL DECOMPRESSION/DISCECTOMY FUSION 1 LEVEL;  Surgeon: Tressie Stalker, MD;  Location: [facility];  Service: Neurosurgery;  Laterality: N/A;  C56 anterior cervical decompression with fusion interbody prosthesis plating and bonegraft   BACK SURGERY     X2/ L4,5 & S1/ Cervical C7?   BLADDER SURGERY  1981   SUSPENSION   BREAST REDUCTION SURGERY     CARDIOVERSION N/A 10/29/2020   Procedure: CARDIOVERSION;  Surgeon: Lamar Blinks, MD;  Location: [facility];  Service: Cardiovascular;  Laterality: N/A;   CARPAL TUNNEL RELEASE  2007   CATARACT EXTRACTION W/PHACO Right 12/30/2016   Procedure: CATARACT EXTRACTION PHACO AND INTRAOCULAR LENS PLACEMENT (IOC)  Right toric lens;  Surgeon: Lockie Mola, MD;  Location: [facility];  Service: Ophthalmology;  Laterality: Right;  sleep apnea, does not use CPAP   CATARACT EXTRACTION W/PHACO Left 01/20/2017   Procedure: CATARACT EXTRACTION PHACO AND INTRAOCULAR LENS PLACEMENT (IOC)  Left Toric;  Surgeon: Lockie Mola, MD;  Location: [facility];  Service: Ophthalmology;  Laterality: Left;  toric lens   CHOLECYSTECTOMY  1996   COLONOSCOPY     COLONOSCOPY WITH PROPOFOL N/A 05/22/2019   Procedure: COLONOSCOPY WITH PROPOFOL;  Surgeon: Toledo, Boykin Nearing, MD;  Location: [facility];  Service: Gastroenterology;  Laterality: N/A;   FOOT SURGERY  2005   JOINT REPLACEMENT Bilateral    KNEE   LAMINECTOMY  1983 & 1985   REDUCTION MAMMAPLASTY Bilateral 2001   ROTATOR CUFF REPAIR  2000 & 2004   left and right   SPINAL CORD STIMULATOR IMPLANT  03/1997   SPINAL CORD STIMULATOR REMOVAL  2007   Tendon repair right shoulder     TONSILLECTOMY     TOTAL KNEE ARTHROPLASTY  10/2011 & 09/13   left and right   Family History  Problem Relation  Age of Onset   Heart disease Mother    Stroke Mother    Hypertension Mother    Hyperlipidemia Father    Heart disease Father    Heart disease Brother        2 brothers   Alzheimer's disease Brother    Heart disease Sister        pacemaker   Breast cancer Daughter 42   Social History   Socioeconomic History   Marital status: Widowed    Spouse name: Not on file   Number of children: 2   Years of education: Not on file   Highest education level: 12th grade  Occupational History   Not on file  Tobacco Use   Smoking status: Former    Current packs/day: 0.00    Average packs/day: 1 pack/day for 20.0 years (20.0 ttl pk-yrs)    Types: Cigarettes    Start date: 09/07/1968    Quit date: 09/07/1988    Years since quitting: 34.7   Smokeless tobacco: Never  Vaping Use   Vaping status: Never Used  Substance and Sexual Activity   Alcohol use: Yes    Alcohol/week: 2.0 standard drinks of alcohol    Types: 2 Glasses of wine per week    Comment: occasional   Drug use: No   Sexual activity: Never  Other Topics Concern   Not on file  Social History Narrative   She is widowed, has two daughters. [place]; quit smoking- in 1990s; social alcohol.    Social Determinants of Health   Financial Resource Strain: Low Risk  (01/08/2023)   Overall Financial Resource Strain (CARDIA)    Difficulty of Paying Living Expenses: Not hard at all  Food Insecurity: No Food Insecurity (01/12/2023)   Hunger Vital Sign    Worried About Running Out of Food in the Last Year: Never true    Ran Out of Food in the Last Year: Never true  Transportation Needs: No Transportation Needs (01/12/2023)   PRAPARE - Administrator, Civil Service (Medical): No    Lack of Transportation (Non-Medical): No  Physical Activity: Insufficiently Active (01/08/2023)   Exercise Vital Sign    Days of Exercise per Week: 4 days    Minutes of Exercise per Session: 20 min  Stress: No Stress Concern Present (01/08/2023)   [employer] of Occupational Health - Occupational Stress Questionnaire    Feeling of Stress : Not at all  Social Connections: Moderately Integrated (01/08/2023)   Social Connection and Isolation Panel [NHANES]    Frequency of Communication with Friends and Family: More than three times a week    Frequency of Social Gatherings with Friends and Family: Twice a week  Attends Religious Services: More than 4 times per year    Active Member of Clubs or Organizations: Yes    Attends Banker Meetings: More than 4 times per year    Marital Status: Widowed     Review of Systems  Constitutional:  Negative for appetite change and unexpected weight change.  HENT:  Negative for congestion, sinus pressure and sore throat.   Eyes:  Negative for pain and visual disturbance.  Respiratory:  Positive for shortness of breath. Negative for cough and chest tightness.   Cardiovascular:  Negative for chest pain, palpitations and leg swelling.  Gastrointestinal:  Negative for abdominal pain, diarrhea, nausea and vomiting.  Genitourinary:  Negative for difficulty urinating and dysuria.  Musculoskeletal:  Negative for joint swelling and myalgias.  Skin:  Negative for color change and rash.  Neurological:  Negative for dizziness and headaches.  Hematological:  Negative for adenopathy. Does not bruise/bleed easily.  Psychiatric/Behavioral:  Negative for agitation and dysphoric mood.        Objective:     BP 128/72   Pulse 92   Temp 98.1 F (36.7 C) (Oral)   Ht 5\' 4"  (1.626 m)   Wt 147 lb 3.2 oz (66.8 kg)   LMP 08/15/1978   SpO2 98%   BMI 25.27 kg/m  Wt Readings from Last 3 Encounters:  05/14/23 147 lb 3.2 oz (66.8 kg)  01/11/23 144 lb 6.4 oz (65.5 kg)  01/06/23 141 lb (64 kg)    Physical Exam Vitals reviewed.  Constitutional:      General: She is not in acute distress.    Appearance: Normal appearance. She is well-developed.  HENT:     Head: Normocephalic and atraumatic.     Right  Ear: External ear normal.     Left Ear: External ear normal.  Eyes:     General: No scleral icterus.       Right eye: No discharge.        Left eye: No discharge.     Conjunctiva/sclera: Conjunctivae normal.  Neck:     Thyroid: No thyromegaly.  Cardiovascular:     Rate and Rhythm: Normal rate and regular rhythm.  Pulmonary:     Effort: No tachypnea, accessory muscle usage or respiratory distress.     Breath sounds: Normal breath sounds. No decreased breath sounds or wheezing.  Chest:  Breasts:    Right: No inverted nipple, mass, nipple discharge or tenderness (no axillary adenopathy).     Left: No inverted nipple, mass, nipple discharge or tenderness (no axilarry adenopathy).  Abdominal:     General: Bowel sounds are normal.     Palpations: Abdomen is soft.     Tenderness: There is no abdominal tenderness.  Musculoskeletal:        General: No swelling or tenderness.     Cervical back: Neck supple.  Lymphadenopathy:     Cervical: No cervical adenopathy.  Skin:    Findings: No erythema or rash.  Neurological:     Mental Status: She is alert and oriented to person, place, and time.  Psychiatric:        Mood and Affect: Mood normal.        Behavior: Behavior normal.      Outpatient Encounter Medications as of 05/14/2023  Medication Sig   cyanocobalamin 1000 MCG tablet Take 1,000 mcg by mouth.   ELIQUIS 2.5 MG TABS tablet Take 2.5 mg by mouth 2 (two) times daily.   finasteride (PROSCAR) 5 MG tablet Take  1 tablet (5 mg total) by mouth daily.   latanoprost (XALATAN) 0.005 % ophthalmic solution Place 1 drop into both eyes at bedtime.    Lifitegrast (XIIDRA) 5 % SOLN Place 1 drop into both eyes daily.   losartan (COZAAR) 25 MG tablet 25 mg daily.   metoprolol succinate (TOPROL-XL) 25 MG 24 hr tablet Take 25 mg by mouth in the morning and at bedtime.   minoxidil (LONITEN) 2.5 MG tablet Take 1 tablet (2.5 mg total) by mouth daily.   pantoprazole (PROTONIX) 20 MG tablet One tablet  qod   rosuvastatin (CRESTOR) 10 MG tablet TAKE 1 TABLET DAILY   tolterodine (DETROL LA) 4 MG 24 hr capsule Take 1 capsule (4 mg total) by mouth daily.   [DISCONTINUED] carbamide peroxide (DEBROX) 6.5 % OTIC solution 4-5 drops in left ear.  Massage for approximately 5 minutes daily. (Patient not taking: Reported on 01/20/2023)   [DISCONTINUED] Tralokinumab-ldrm (ADBRY) 150 MG/ML SOSY Inject 2 mLs (300 mg total) into the skin every 14 (fourteen) days. Starting on day 15 for maintenance.   [DISCONTINUED] triamcinolone ointment (KENALOG) 0.1 % Apply 1 Application topically daily as needed. Avoid face, groin and axilla   Facility-Administered Encounter Medications as of 05/14/2023  Medication   dupilumab (DUPIXENT) prefilled syringe 300 mg   Tralokinumab-ldrm SOSY 300 mg     Lab Results  Component Value Date   WBC 5.9 01/20/2023   HGB 10.8 (L) 01/20/2023   HCT 33.8 (L) 01/20/2023   PLT 330 01/20/2023   GLUCOSE 86 05/11/2023   CHOL 175 05/11/2023   TRIG 120.0 05/11/2023   HDL 90.40 05/11/2023   LDLDIRECT 145.0 01/06/2021   LDLCALC 60 05/11/2023   ALT 10 05/11/2023   AST 19 05/11/2023   NA 139 05/11/2023   K 4.6 05/11/2023   CL 105 05/11/2023   CREATININE 1.13 05/11/2023   BUN 22 05/11/2023   CO2 25 05/11/2023   TSH 2.85 09/04/2022   INR 1.0 10/16/2013   HGBA1C 5.5 07/18/2019    DG Wrist Complete Left  Result Date: 01/18/2023 CLINICAL DATA:  Fall EXAM: LEFT WRIST - COMPLETE 3+ VIEW COMPARISON:  Left wrist x-ray 01/06/2023 FINDINGS: There is no acute fracture or dislocation identified. There is radiocarpal joint space narrowing and calcification of the triangular fibrocartilage compatible with degenerative change, similar to prior. There also moderate degenerative changes of the first carpometacarpal joint, unchanged. There is mild soft tissue swelling surrounding the wrist. IMPRESSION: 1. No acute fracture or dislocation identified. 2. Mild soft tissue swelling surrounding the wrist. 3.  Degenerative changes of the radiocarpal and first carpometacarpal joints. Electronically Signed   By: Darliss Cheney M.D.   On: 01/18/2023 23:24       Assessment & Plan:  Health care maintenance Assessment & Plan: Physical today 05/14/23.   Mammogram 11/21/21 - Birads I.  Scheduled for mammogram 06/02/23. Colonoscopy 05/22/19.     Hypercholesterolemia Assessment & Plan: On crestor.  Low cholesterol diet and exercise.  Follow lipid panel and liver function tests.    Orders: -     Lipid panel; Future -     Hepatic function panel; Future -     Basic metabolic panel; Future  Anemia, unspecified type Assessment & Plan: Being followed by Dr Donneta Romberg.  IV venofer prn.  Follow cbc. Saw Dr Donneta Romberg 01/20/23 - venofer.    Thoracic aortic aneurysm without rupture, unspecified part [facility]) Assessment & Plan: Has been followed by cardiology.    Stage 3b chronic kidney disease (HCC)  Assessment & Plan: Avoid nephrotoxins.  On losartan. Follow pressures.  Follow metabolic panel.   Had f/u with nephrology 03/03/23 - stable.   Gastroesophageal reflux disease, unspecified whether esophagitis present Assessment & Plan: On protonix 20mg  qod now.  Symptoms controlled.  Follow.    History of colonic polyps Assessment & Plan: Colonoscopy September 2020-1 tubular adenomatous polyp (hepatic flexure), diverticulosis and internal hemorrhoids.   Other nonthrombocytopenic purpura (HCC) Assessment & Plan: History of afib.  On eliquis.    Primary hypertension Assessment & Plan: Continue on losartan and metoprolol.  Blood pressure doing well.  Same medication regimen.  Follow.     SOB (shortness of breath) on exertion Assessment & Plan: Still with sob with exertion - stable.  Just saw cardiology - recommended continuing on toprol, eliquis.  Recommended echo. S/p echo 01/2023 - moderate TR, mild MR, PFO with Lto R shunting, mild to moderate PH with EF >55%. Discuss with cardiology regarding further  evaluation.    Stress Assessment & Plan: Overall appears to be handling things well.  Follow.    Atrial fibrillation, unspecified type [facility]) Assessment & Plan: On metoprolol.  On eliquis. S/P ablation in September. Overall appears to be stable.          Dale Homestead, MD

## 2023-05-14 NOTE — Assessment & Plan Note (Signed)
Physical today 05/14/23.   Mammogram 11/21/21 - Birads I.  Scheduled for mammogram 06/02/23. Colonoscopy 05/22/19.

## 2023-05-16 ENCOUNTER — Encounter: Payer: Self-pay | Admitting: Internal Medicine

## 2023-05-16 NOTE — Assessment & Plan Note (Signed)
Avoid nephrotoxins.  On losartan. Follow pressures.  Follow metabolic panel.   Had f/u with nephrology 03/03/23 - stable.

## 2023-05-16 NOTE — Assessment & Plan Note (Signed)
History of afib.  On eliquis.   

## 2023-05-16 NOTE — Assessment & Plan Note (Signed)
On crestor.  Low cholesterol diet and exercise.  Follow lipid panel and liver function tests.

## 2023-05-16 NOTE — Assessment & Plan Note (Signed)
Has been followed by cardiology.  

## 2023-05-16 NOTE — Assessment & Plan Note (Signed)
Overall appears to be handling things well.  Follow.  ?

## 2023-05-16 NOTE — Assessment & Plan Note (Signed)
On protonix '20mg'$  qod now.  Symptoms controlled.  Follow.

## 2023-05-16 NOTE — Assessment & Plan Note (Signed)
Continue on losartan and metoprolol.  Blood pressure doing well.  Same medication regimen.  Follow.   

## 2023-05-16 NOTE — Assessment & Plan Note (Signed)
On metoprolol.  On eliquis. S/P ablation in September. Overall appears to be stable.

## 2023-05-16 NOTE — Assessment & Plan Note (Signed)
Being followed by Dr Donneta Romberg.  IV venofer prn.  Follow cbc. Saw Dr Donneta Romberg 01/20/23 - venofer.

## 2023-05-16 NOTE — Assessment & Plan Note (Signed)
Still with sob with exertion - stable.  Just saw cardiology - recommended continuing on toprol, eliquis.  Recommended echo. S/p echo 01/2023 - moderate TR, mild MR, PFO with Lto R shunting, mild to moderate PH with EF >55%. Discuss with cardiology regarding further evaluation.

## 2023-05-16 NOTE — Assessment & Plan Note (Signed)
Colonoscopy September 2020-1 tubular adenomatous polyp (hepatic flexure), diverticulosis and internal hemorrhoids. 

## 2023-05-24 ENCOUNTER — Inpatient Hospital Stay: Payer: Medicare Other

## 2023-05-24 ENCOUNTER — Inpatient Hospital Stay (HOSPITAL_BASED_OUTPATIENT_CLINIC_OR_DEPARTMENT_OTHER): Payer: Medicare Other | Admitting: Internal Medicine

## 2023-05-24 ENCOUNTER — Inpatient Hospital Stay: Payer: Medicare Other | Attending: Internal Medicine

## 2023-05-24 ENCOUNTER — Encounter: Payer: Self-pay | Admitting: Internal Medicine

## 2023-05-24 VITALS — BP 115/50 | HR 68

## 2023-05-24 VITALS — BP 125/71 | HR 85 | Temp 96.1°F | Ht 64.0 in | Wt 146.8 lb

## 2023-05-24 DIAGNOSIS — D509 Iron deficiency anemia, unspecified: Secondary | ICD-10-CM | POA: Diagnosis not present

## 2023-05-24 DIAGNOSIS — I129 Hypertensive chronic kidney disease with stage 1 through stage 4 chronic kidney disease, or unspecified chronic kidney disease: Secondary | ICD-10-CM | POA: Insufficient documentation

## 2023-05-24 DIAGNOSIS — D649 Anemia, unspecified: Secondary | ICD-10-CM

## 2023-05-24 DIAGNOSIS — E611 Iron deficiency: Secondary | ICD-10-CM

## 2023-05-24 DIAGNOSIS — N183 Chronic kidney disease, stage 3 unspecified: Secondary | ICD-10-CM | POA: Insufficient documentation

## 2023-05-24 LAB — CBC WITH DIFFERENTIAL (CANCER CENTER ONLY)
Abs Immature Granulocytes: 0.03 10*3/uL (ref 0.00–0.07)
Basophils Absolute: 0.1 10*3/uL (ref 0.0–0.1)
Basophils Relative: 1 %
Eosinophils Absolute: 0.1 10*3/uL (ref 0.0–0.5)
Eosinophils Relative: 1 %
HCT: 31.4 % — ABNORMAL LOW (ref 36.0–46.0)
Hemoglobin: 10 g/dL — ABNORMAL LOW (ref 12.0–15.0)
Immature Granulocytes: 1 %
Lymphocytes Relative: 38 %
Lymphs Abs: 1.9 10*3/uL (ref 0.7–4.0)
MCH: 33.4 pg (ref 26.0–34.0)
MCHC: 31.8 g/dL (ref 30.0–36.0)
MCV: 105 fL — ABNORMAL HIGH (ref 80.0–100.0)
Monocytes Absolute: 0.4 10*3/uL (ref 0.1–1.0)
Monocytes Relative: 8 %
Neutro Abs: 2.5 10*3/uL (ref 1.7–7.7)
Neutrophils Relative %: 51 %
Platelet Count: 332 10*3/uL (ref 150–400)
RBC: 2.99 MIL/uL — ABNORMAL LOW (ref 3.87–5.11)
RDW: 12.7 % (ref 11.5–15.5)
WBC Count: 4.9 10*3/uL (ref 4.0–10.5)
nRBC: 0 % (ref 0.0–0.2)

## 2023-05-24 LAB — IRON AND TIBC
Iron: 63 ug/dL (ref 28–170)
Saturation Ratios: 15 % (ref 10.4–31.8)
TIBC: 410 ug/dL (ref 250–450)
UIBC: 347 ug/dL

## 2023-05-24 LAB — LACTATE DEHYDROGENASE: LDH: 168 U/L (ref 98–192)

## 2023-05-24 LAB — BASIC METABOLIC PANEL
Anion gap: 9 (ref 5–15)
BUN: 22 mg/dL (ref 8–23)
CO2: 22 mmol/L (ref 22–32)
Calcium: 9.4 mg/dL (ref 8.9–10.3)
Chloride: 101 mmol/L (ref 98–111)
Creatinine, Ser: 1.32 mg/dL — ABNORMAL HIGH (ref 0.44–1.00)
GFR, Estimated: 40 mL/min — ABNORMAL LOW (ref >60)
Glucose, Bld: 93 mg/dL (ref 70–99)
Potassium: 4.3 mmol/L (ref 3.5–5.1)
Sodium: 132 mmol/L — ABNORMAL LOW (ref 135–145)

## 2023-05-24 LAB — VITAMIN B12: Vitamin B-12: 750 pg/mL (ref 180–914)

## 2023-05-24 LAB — FERRITIN: Ferritin: 15 ng/mL (ref 11–307)

## 2023-05-24 MED ORDER — SODIUM CHLORIDE 0.9 % IV SOLN
Freq: Once | INTRAVENOUS | Status: AC
  Filled 2023-05-24: qty 250

## 2023-05-24 MED ORDER — SODIUM CHLORIDE 0.9 % IV SOLN
200.0000 mg | Freq: Once | INTRAVENOUS | Status: AC
  Administered 2023-05-24: 200 mg via INTRAVENOUS
  Filled 2023-05-24: qty 200

## 2023-05-24 NOTE — Progress Notes (Signed)
Fatigue/weakness: yes Dyspena: yes Light headedness: yes occasional Blood in stool:  no  Getting over an URI x2weeks.  SOB with activity.  IBS with constipation, takes no aid.

## 2023-05-24 NOTE — Progress Notes (Signed)
[facility] CONSULT NOTE  Patient Care Team: Dale Keene, MD as PCP - General (Internal Medicine) Stanton Kidney, MD as Consulting Physician (Gastroenterology) Earna Coder, MD as Consulting Physician (Internal Medicine)  CHIEF COMPLAINTS/PURPOSE OF CONSULTATION: ANEMIA  HEMATOLOGY HISTORY:  # AUG 2022:  SEVERE ANEMIA [hb-7; ]  EGD/Colonoscopy:2020- last colo-36mm [facility]; Dr.Toledo]; Esophagogram- 2022- NEG for stricture. ? capsule  # CKD stage III [Dr.Korrapti]; A.fib [on eliquis on 2.5 mg BID;  Dr.Kowalski]   HISTORY OF PRESENTING ILLNESS: Alone. Ambulating independently.  Leah Huber 82 y.o.  female iron deficient/macrocytic anemia of unclear etiology- / CKD-III; A-fib on Eliquis is here for follow-up.   Patient complains of ongoing fatigue.  Occasional dizziness.  No blood in stools of blood closely.  Shortness of breath on exertion.   Getting over an URI x  2 weeks.  No worsening cough- just mild cough. NO antibiotics.  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  Review of Systems  Constitutional:  Positive for malaise/fatigue. Negative for chills, diaphoresis, fever and weight loss.  HENT:  Negative for nosebleeds and sore throat.   Eyes:  Negative for double vision.  Respiratory:  Negative for cough, hemoptysis, sputum production and wheezing.   Cardiovascular:  Negative for chest pain, palpitations, orthopnea and leg swelling.  Gastrointestinal:  Positive for constipation. Negative for blood in stool, heartburn, melena and vomiting.  Genitourinary:  Negative for dysuria, frequency and urgency.  Musculoskeletal:  Positive for back pain and joint pain.  Skin: Negative.  Negative for itching and rash.  Neurological:  Negative for tingling, focal weakness, weakness and headaches.  Endo/Heme/Allergies:  Does not bruise/bleed easily.  Psychiatric/Behavioral:  Negative for depression. The patient is  not nervous/anxious and does not have insomnia.     MEDICAL HISTORY:  Past Medical History:  Diagnosis Date   Abnormal liver function test 07/23/2019   Anemia    Aortic aneurysm (HCC)    "SMALL"   Arthritis    neck to tailbone   Bilateral carpal tunnel syndrome 12/08/2017   Bronchitis    Chronic cough    @ NIGHT/ SEASONAL ALLERGIES   Clostridium difficile infection 2013-2014   Colon polyp    Concussion    Coronary artery disease    Dr Gwen Pounds cardiologist   Depression    Diverticulosis    GERD (gastroesophageal reflux disease)    Glaucoma    Gout    Hemorrhoids    History of chicken pox    History of colonic polyps    History of shingles    Hypercholesterolemia    Hypertension    CONTROLLED ON MEDS   Hypovolemia 12/10   acute renal failure   IBS (irritable bowel syndrome)    Neuromuscular disorder (HCC)    occasionally tingling in hands/ NUMBNESS IN RIGHT FOOT s/p back surgery   Osteoarthritis    bilateral knees   Osteopenia    Pneumonia    HX OF   Sleep apnea    very mild   sleep study 8 yrs ago does not use CPAP   Urinary incontinence     SURGICAL HISTORY: Past Surgical History:  Procedure Laterality Date   ABDOMINAL HYSTERECTOMY  1979   ANTERIOR CERVICAL DECOMP/DISCECTOMY FUSION N/A 11/28/2014   Procedure: CERVICAL FIVE-SIX  ANTERIOR CERVICAL DECOMPRESSION/DISCECTOMY FUSION 1 LEVEL;  Surgeon: Tressie Stalker, MD;  Location: [facility];  Service: Neurosurgery;  Laterality: N/A;  C56 anterior cervical  decompression with fusion interbody prosthesis plating and bonegraft   BACK SURGERY     X2/ L4,5 & S1/ Cervical C7?   BLADDER SURGERY  1981   SUSPENSION   BREAST REDUCTION SURGERY     CARDIOVERSION N/A 10/29/2020   Procedure: CARDIOVERSION;  Surgeon: Lamar Blinks, MD;  Location: [facility] OR[facility];  Service: Cardiovascular;  Laterality: N/A;   CARPAL TUNNEL RELEASE  2007   CATARACT EXTRACTION W/PHACO Right 12/30/2016   Procedure: CATARACT EXTRACTION PHACO AND  INTRAOCULAR LENS PLACEMENT (IOC)  Right toric lens;  Surgeon: Lockie Mola, MD;  Location: [facility];  Service: Ophthalmology;  Laterality: Right;  sleep apnea, does not use CPAP   CATARACT EXTRACTION W/PHACO Left 01/20/2017   Procedure: CATARACT EXTRACTION PHACO AND INTRAOCULAR LENS PLACEMENT (IOC)  Left Toric;  Surgeon: Lockie Mola, MD;  Location: [facility];  Service: Ophthalmology;  Laterality: Left;  toric lens   CHOLECYSTECTOMY  1996   COLONOSCOPY     COLONOSCOPY WITH PROPOFOL N/A 05/22/2019   Procedure: COLONOSCOPY WITH PROPOFOL;  Surgeon: Toledo, Boykin Nearing, MD;  Location: [facility];  Service: Gastroenterology;  Laterality: N/A;   FOOT SURGERY  2005   JOINT REPLACEMENT Bilateral    KNEE   LAMINECTOMY  1983 & 1985   REDUCTION MAMMAPLASTY Bilateral 2001   ROTATOR CUFF REPAIR  2000 & 2004   left and right   SPINAL CORD STIMULATOR IMPLANT  03/1997   SPINAL CORD STIMULATOR REMOVAL  2007   Tendon repair right shoulder     TONSILLECTOMY     TOTAL KNEE ARTHROPLASTY  10/2011 & 09/13   left and right    SOCIAL HISTORY: Social History   Socioeconomic History   Marital status: Widowed    Spouse name: Not on file   Number of children: 2   Years of education: Not on file   Highest education level: 12th grade  Occupational History   Not on file  Tobacco Use   Smoking status: Former    Current packs/day: 0.00    Average packs/day: 1 pack/day for 20.0 years (20.0 ttl pk-yrs)    Types: Cigarettes    Start date: 09/07/1968    Quit date: 09/07/1988    Years since quitting: 34.7   Smokeless tobacco: Never  Vaping Use   Vaping status: Never Used  Substance and Sexual Activity   Alcohol use: Yes    Alcohol/week: 2.0 standard drinks of alcohol    Types: 2 Glasses of wine per week    Comment: occasional   Drug use: No   Sexual activity: Never  Other Topics Concern   Not on file  Social History Narrative   She is widowed, has two daughters. [place];  quit smoking- in 1990s; social alcohol.    Social Determinants of Health   Financial Resource Strain: Low Risk  (01/08/2023)   Overall Financial Resource Strain (CARDIA)    Difficulty of Paying Living Expenses: Not hard at all  Food Insecurity: No Food Insecurity (01/12/2023)   Hunger Vital Sign    Worried About Running Out of Food in the Last Year: Never true    Ran Out of Food in the Last Year: Never true  Transportation Needs: No Transportation Needs (01/12/2023)   PRAPARE - Administrator, Civil Service (Medical): No    Lack of Transportation (Non-Medical): No  Physical Activity: Insufficiently Active (01/08/2023)   Exercise Vital Sign    Days of Exercise per Week: 4 days    Minutes of  Exercise per Session: 20 min  Stress: No Stress Concern Present (01/08/2023)   [employer] of Occupational Health - Occupational Stress Questionnaire    Feeling of Stress : Not at all  Social Connections: Moderately Integrated (01/08/2023)   Social Connection and Isolation Panel [NHANES]    Frequency of Communication with Friends and Family: More than three times a week    Frequency of Social Gatherings with Friends and Family: Twice a week    Attends Religious Services: More than 4 times per year    Active Member of [employer] or Organizations: Yes    Attends Banker Meetings: More than 4 times per year    Marital Status: Widowed  Intimate Partner Violence: Not At Risk (08/27/2022)   Humiliation, Afraid, Rape, and Kick questionnaire    Fear of Current or Ex-Partner: No    Emotionally Abused: No    Physically Abused: No    Sexually Abused: No    FAMILY HISTORY: Family History  Problem Relation Age of Onset   Heart disease Mother    Stroke Mother    Hypertension Mother    Hyperlipidemia Father    Heart disease Father    Heart disease Brother        2 brothers   Alzheimer's disease Brother    Heart disease Sister        pacemaker   Breast cancer Daughter 72     ALLERGIES:  is allergic to clindamycin/lincomycin, dronedarone, bactrim [sulfamethoxazole-trimethoprim], morphine, morphine and codeine, and sulfa antibiotics.  MEDICATIONS:  Current Outpatient Medications  Medication Sig Dispense Refill   cyanocobalamin 1000 MCG tablet Take 1,000 mcg by mouth.     ELIQUIS 2.5 MG TABS tablet Take 2.5 mg by mouth 2 (two) times daily.     finasteride (PROSCAR) 5 MG tablet Take 1 tablet (5 mg total) by mouth daily. 90 tablet 1   latanoprost (XALATAN) 0.005 % ophthalmic solution Place 1 drop into both eyes at bedtime.      Lifitegrast (XIIDRA) 5 % SOLN Place 1 drop into both eyes daily.     losartan (COZAAR) 25 MG tablet 25 mg daily.     metoprolol succinate (TOPROL-XL) 25 MG 24 hr tablet Take 25 mg by mouth in the morning and at bedtime.     minoxidil (LONITEN) 2.5 MG tablet Take 1 tablet (2.5 mg total) by mouth daily. 90 tablet 1   pantoprazole (PROTONIX) 20 MG tablet One tablet qod 90 tablet 2   rosuvastatin (CRESTOR) 10 MG tablet TAKE 1 TABLET DAILY 90 tablet 3   tolterodine (DETROL LA) 4 MG 24 hr capsule Take 1 capsule (4 mg total) by mouth daily. 90 capsule 3   Current Facility-Administered Medications  Medication Dose Route Frequency Provider Last Rate Last Admin   dupilumab (DUPIXENT) prefilled syringe 300 mg  300 mg Subcutaneous Q14 Days Willeen Niece, MD       Tralokinumab-ldrm SOSY 300 mg  300 mg Subcutaneous Once Willeen Niece, MD          PHYSICAL EXAMINATION:   Vitals:   05/24/23 1245  BP: 125/71  Pulse: 85  Temp: (!) 96.1 F (35.6 C)  SpO2: 100%   Filed Weights   05/24/23 1245  Weight: 146 lb 12.8 oz (66.6 kg)     Physical Exam Vitals and nursing note reviewed.  HENT:     Head: Normocephalic and atraumatic.     Mouth/Throat:     Pharynx: Oropharynx is clear.  Eyes:  Extraocular Movements: Extraocular movements intact.     Pupils: Pupils are equal, round, and reactive to light.  Cardiovascular:     Rate and  Rhythm: Normal rate and regular rhythm.  Pulmonary:     Comments: Decreased breath sounds bilaterally.  Abdominal:     Palpations: Abdomen is soft.  Musculoskeletal:        General: Normal range of motion.     Cervical back: Normal range of motion.  Skin:    General: Skin is warm.  Neurological:     General: No focal deficit present.     Mental Status: She is alert and oriented to person, place, and time.  Psychiatric:        Behavior: Behavior normal.        Judgment: Judgment normal.    LABORATORY DATA:  I have reviewed the data as listed Lab Results  Component Value Date   WBC 4.9 05/24/2023   HGB 10.0 (L) 05/24/2023   HCT 31.4 (L) 05/24/2023   MCV 105.0 (H) 05/24/2023   PLT 332 05/24/2023   Recent Labs    09/04/22 0851 01/06/23 1048 01/08/23 1051 01/20/23 1454 05/11/23 0900 05/24/23 1255  NA 141 136 140 138 139 132*  K 4.3 4.3 4.7 4.5 4.6 4.3  CL 104 102 104 106 105 101  CO2 26 24 27  21* 25 22  GLUCOSE 95 97 88 105* 86 93  BUN 24* 25* 22 31* 22 22  CREATININE 1.20 1.19* 1.16 1.35* 1.13 1.32*  CALCIUM 9.7 9.6 9.4 9.5 9.3 9.4  GFRNONAA  --  45*  --  39*  --  40*  PROT 6.8 7.8 6.7  --  7.0  --   ALBUMIN 4.6 4.6 4.4  --  4.3  --   AST 19 31 22   --  19  --   ALT 10 14 11   --  10  --   ALKPHOS 51 59 55  --  57  --   BILITOT 0.5 0.7 0.5  --  0.6  --   BILIDIR 0.1  --  0.1  --  0.1  --      No results found.  Symptomatic anemia #Anemia-iron deficiency; - nadir July 2022- Hemoglobin improved from 7 to 12.  INTOLERANCE PO gentle iron/ constipation [needing enema].  Proceed with iron infusion today- stable.   # Macrocytosis- ? unclear etiology- B12 JAN 2024- > 1000; recommend qOD on b12 pills;  / CKD vs others. hold off bone marrow biopsy.-  # Etiology of iron deficiency: [Oct 2022] s/p [facility] evaluation; s/p  barium esophagogram.  #A. fib [Dr.Thomas; [facility] ]-on Eliquis 2.5 mg twice daily- s/p ablation [[facility]-Oct 2022]- stable  # CKD- Stage III- GFR 46- stable.  [Dr.korrapati]  # DISPOSITION: # venofer today;  # in 2 weeks- venofer  # follow up in 4 month-  MD; labs- cbc/bmp/iron studies/ferritin; LDH;B12 level- Possible venofer-Dr.B   All questions were answered. The patient knows to call the clinic with any problems, questions or concerns.    Earna Coder, MD 05/24/2023 1:54 PM

## 2023-05-24 NOTE — Assessment & Plan Note (Signed)
#  Anemia-iron deficiency; - nadir July 2022- Hemoglobin improved from 7 to 12.  INTOLERANCE PO gentle iron/ constipation [needing enema].  Proceed with iron infusion today- stable.   # Macrocytosis- ? unclear etiology- B12 JAN 2024- > 1000; recommend qOD on b12 pills;  / CKD vs others. hold off bone marrow biopsy.-  # Etiology of iron deficiency: [Oct 2022] s/p [facility] evaluation; s/p  barium esophagogram.  #A. fib [Dr.Thomas; [facility] ]-on Eliquis 2.5 mg twice daily- s/p ablation [[facility]-Oct 2022]- stable  # CKD- Stage III- GFR 46- stable. [Dr.korrapati]  # DISPOSITION: # venofer today;  # in 2 weeks- venofer  # follow up in 4 month-  MD; labs- cbc/bmp/iron studies/ferritin; LDH;B12 level- Possible venofer-Dr.B

## 2023-05-24 NOTE — Patient Instructions (Signed)
Iron Sucrose Injection What is this medication? IRON SUCROSE (EYE ern SOO krose) treats low levels of iron (iron deficiency anemia) in people with kidney disease. Iron is a mineral that plays an important role in making red blood cells, which carry oxygen from your lungs to the rest of your body. This medicine may be used for other purposes; ask your health care provider or pharmacist if you have questions. COMMON BRAND NAME(S): Venofer What should I tell my care team before I take this medication? They need to know if you have any of these conditions: Anemia not caused by low iron levels Heart disease High levels of iron in the blood Kidney disease Liver disease An unusual or allergic reaction to iron, other medications, foods, dyes, or preservatives Pregnant or trying to get pregnant Breastfeeding How should I use this medication? This medication is for infusion into a vein. It is given in a hospital or clinic setting. Talk to your care team about the use of this medication in children. While this medication may be prescribed for children as young as 2 years for selected conditions, precautions do apply. Overdosage: If you think you have taken too much of this medicine contact a poison control center or emergency room at once. NOTE: This medicine is only for you. Do not share this medicine with others. What if I miss a dose? Keep appointments for follow-up doses. It is important not to miss your dose. Call your care team if you are unable to keep an appointment. What may interact with this medication? Do not take this medication with any of the following: Deferoxamine Dimercaprol Other iron products This medication may also interact with the following: Chloramphenicol Deferasirox This list may not describe all possible interactions. Give your health care provider a list of all the medicines, herbs, non-prescription drugs, or dietary supplements you use. Also tell them if you smoke,  drink alcohol, or use illegal drugs. Some items may interact with your medicine. What should I watch for while using this medication? Visit your care team regularly. Tell your care team if your symptoms do not start to get better or if they get worse. You may need blood work done while you are taking this medication. You may need to follow a special diet. Talk to your care team. Foods that contain iron include: whole grains/cereals, dried fruits, beans, or peas, leafy green vegetables, and organ meats (liver, kidney). What side effects may I notice from receiving this medication? Side effects that you should report to your care team as soon as possible: Allergic reactions--skin rash, itching, hives, swelling of the face, lips, tongue, or throat Low blood pressure--dizziness, feeling faint or lightheaded, blurry vision Shortness of breath Side effects that usually do not require medical attention (report to your care team if they continue or are bothersome): Flushing Headache Joint pain Muscle pain Nausea Pain, redness, or irritation at injection site This list may not describe all possible side effects. Call your doctor for medical advice about side effects. You may report side effects to FDA at 1-800-FDA-1088. Where should I keep my medication? This medication is given in a hospital or clinic. It will not be stored at home. NOTE: This sheet is a summary. It may not cover all possible information. If you have questions about this medicine, talk to your doctor, pharmacist, or health care provider.  2024 Elsevier/Gold Standard (2023-01-29 00:00:00)

## 2023-06-02 ENCOUNTER — Ambulatory Visit
Admission: RE | Admit: 2023-06-02 | Discharge: 2023-06-02 | Disposition: A | Discharge status: Home / Self Care | Payer: Medicare Other | Source: Ambulatory Visit | Attending: Internal Medicine | Admitting: Internal Medicine

## 2023-06-02 DIAGNOSIS — Z1231 Encounter for screening mammogram for malignant neoplasm of breast: Secondary | ICD-10-CM | POA: Diagnosis present

## 2023-06-07 ENCOUNTER — Inpatient Hospital Stay: Payer: Medicare Other

## 2023-06-07 VITALS — BP 114/52 | HR 73 | Temp 96.1°F | Resp 18

## 2023-06-07 DIAGNOSIS — N183 Chronic kidney disease, stage 3 unspecified: Secondary | ICD-10-CM | POA: Diagnosis not present

## 2023-06-07 DIAGNOSIS — I129 Hypertensive chronic kidney disease with stage 1 through stage 4 chronic kidney disease, or unspecified chronic kidney disease: Secondary | ICD-10-CM | POA: Diagnosis not present

## 2023-06-07 DIAGNOSIS — E611 Iron deficiency: Secondary | ICD-10-CM

## 2023-06-07 DIAGNOSIS — D509 Iron deficiency anemia, unspecified: Secondary | ICD-10-CM | POA: Diagnosis not present

## 2023-06-07 MED ORDER — SODIUM CHLORIDE 0.9 % IV SOLN
Freq: Once | INTRAVENOUS | Status: AC
  Filled 2023-06-07: qty 250

## 2023-06-07 MED ORDER — SODIUM CHLORIDE 0.9 % IV SOLN
200.0000 mg | Freq: Once | INTRAVENOUS | Status: AC
  Administered 2023-06-07: 200 mg via INTRAVENOUS
  Filled 2023-06-07: qty 200

## 2023-06-17 DIAGNOSIS — H401131 Primary open-angle glaucoma, bilateral, mild stage: Secondary | ICD-10-CM | POA: Diagnosis not present

## 2023-06-17 DIAGNOSIS — H04123 Dry eye syndrome of bilateral lacrimal glands: Secondary | ICD-10-CM | POA: Diagnosis not present

## 2023-06-17 DIAGNOSIS — Z961 Presence of intraocular lens: Secondary | ICD-10-CM | POA: Diagnosis not present

## 2023-06-28 ENCOUNTER — Encounter: Payer: Self-pay | Admitting: Dermatology

## 2023-06-28 ENCOUNTER — Ambulatory Visit (INDEPENDENT_AMBULATORY_CARE_PROVIDER_SITE_OTHER): Payer: Medicare Other | Admitting: Dermatology

## 2023-06-28 VITALS — BP 94/62

## 2023-06-28 DIAGNOSIS — L649 Androgenic alopecia, unspecified: Secondary | ICD-10-CM | POA: Diagnosis not present

## 2023-06-28 DIAGNOSIS — L209 Atopic dermatitis, unspecified: Secondary | ICD-10-CM | POA: Diagnosis not present

## 2023-06-28 DIAGNOSIS — L2081 Atopic neurodermatitis: Secondary | ICD-10-CM

## 2023-06-28 DIAGNOSIS — Z79899 Other long term (current) drug therapy: Secondary | ICD-10-CM

## 2023-06-28 NOTE — Patient Instructions (Addendum)
Dry Skin Care  What causes dry skin?  Dry skin is common and results from inadequate moisture in the outer skin layers. Dry skin usually results from the excessive loss of moisture from the skin surface. This occurs due to two major factors: Normally the skin's oil glands deposit a layer of oil on the skin's surface. This layer of oil prevents the loss of moisture from the skin. Exposure to soaps, cleaners, solvents, and disinfectants removes this oily film, allowing water to escape. Water loss from the skin increases when the humidity is low. During winter months we spend a lot of time indoors where the air is heated. Heated air has very low humidity. This also contributes to dry skin.  A tendency for dry skin may accompany such disorders as eczema. Also, as people age, the number of functioning oil glands decreases, and the tendency toward dry skin can be a sensation of skin tightness when emerging from the shower.  How do I manage dry skin?  Humidify your environment. This can be accomplished by using a humidifier in your bedroom at night during winter months. Bathing can actually put moisture back into your skin if done right. Take the following steps while bathing to sooth dry skin: Avoid hot water, which only dries the skin and makes itching worse. Use warm water. Avoid washcloths or extensive rubbing or scrubbing. Use mild soaps like unscented Dove, Oil of Olay, Cetaphil, Basis, or CeraVe. If you take baths rather than showers, rinse off soap residue with clean water before getting out of tub. Once out of the shower/tub, pat dry gently with a soft towel. Leave your skin damp. While still damp, apply any medicated ointment/cream you were prescribed to the affected areas. After you apply your medicated ointment/cream, then apply your moisturizer to your whole body.This is the most important step in dry skin care. If this is omitted, your skin will continue to be dry. The choice of  moisturizer is also very important. In general, lotion will not provider enough moisture to severely dry skin because it is water based. You should use an ointment or cream. Moisturizers should also be unscented. Good choices include Vaseline (plain petrolatum), Aquaphor, Cetaphil, CeraVe, Vanicream, DML Forte, Aveeno moisture, or Eucerin Cream. Bath oils can be helpful, but do not replace the application of moisturizer after the bath. In addition, they make the tub slippery causing an increased risk for falls. Therefore, we do not recommend their use.    Due to recent changes in healthcare laws, you may see results of your pathology and/or laboratory studies on MyChart before the doctors have had a chance to review them. We understand that in some cases there may be results that are confusing or concerning to you. Please understand that not all results are received at the same time and often the doctors may need to interpret multiple results in order to provide you with the best plan of care or course of treatment. Therefore, we ask that you please give [place] 2 business days to thoroughly review all your results before contacting the office for clarification. Should we see a critical lab result, you will be contacted sooner.   If You Need Anything After Your Visit  If you have any questions or concerns for your doctor, please call our main line at [phone] and press option 4 to reach your doctor's medical assistant. If no one answers, please leave a voicemail as directed and we will return your call as soon as possible.  Messages left after 4 pm will be answered the following business day.   You may also send [place] a message via MyChart. We typically respond to MyChart messages within 1-2 business days.  For prescription refills, please ask your pharmacy to contact our office. Our fax number is [phone].  If you have an urgent issue when the clinic is closed that cannot wait until the next business  day, you can page your doctor at the number below.    Please note that while we do our best to be available for urgent issues outside of office hours, we are not available 24/7.   If you have an urgent issue and are unable to reach [place], you may choose to seek medical care at your doctor's office, retail clinic, urgent care center, or emergency room.  If you have a medical emergency, please immediately call 911 or go to the emergency department.  Pager Numbers  - Dr. Gwen Pounds: [phone]  - Dr. Roseanne Reno: [phone]  - Dr. Katrinka Blazing: [phone]   In the event of inclement weather, please call our main line at [phone] for an update on the status of any delays or closures.  Dermatology Medication Tips: Please keep the boxes that topical medications come in in order to help keep track of the instructions about where and how to use these. Pharmacies typically print the medication instructions only on the boxes and not directly on the medication tubes.   If your medication is too expensive, please contact our office at [phone] option 4 or send [place] a message through MyChart.   We are unable to tell what your co-pay for medications will be in advance as this is different depending on your insurance coverage. However, we may be able to find a substitute medication at lower cost or fill out paperwork to get insurance to cover a needed medication.   If a prior authorization is required to get your medication covered by your insurance company, please allow [place] 1-2 business days to complete this process.  Drug prices often vary depending on where the prescription is filled and some pharmacies may offer cheaper prices.  The website www.goodrx.com contains coupons for medications through different pharmacies. The prices here do not account for what the cost may be with help from insurance (it may be cheaper with your insurance), but the website can give you the price if you did not use any  insurance.  - You can print the associated coupon and take it with your prescription to the pharmacy.  - You may also stop by our office during regular business hours and pick up a GoodRx coupon card.  - If you need your prescription sent electronically to a different pharmacy, notify our office through [facility] or by phone at [phone] option 4.     Si Usted Necesita Algo Despus de Su Visita  Tambin puede enviarnos un mensaje a travs de Clinical cytogeneticist. Por lo general respondemos a los mensajes de MyChart en el transcurso de 1 a 2 das hbiles.  Para renovar recetas, por favor pida a su farmacia que se ponga en contacto con nuestra oficina. Annie Sable de fax es [place] [phone].  Si tiene un asunto urgente cuando la clnica est cerrada y que no puede esperar hasta el siguiente da hbil, puede llamar/localizar a su doctor(a) al nmero que aparece a continuacin.   Por favor, tenga en cuenta que aunque hacemos todo lo posible para estar disponibles para asuntos urgentes fuera del horario de [place],  no estamos disponibles las 24 horas del da, los 7 [address]  [address] de la [place].   Si tiene un problema urgente y no puede comunicarse con nosotros, puede optar por buscar atencin mdica  en el consultorio de su doctor(a), en una clnica privada, en un centro de atencin urgente o en una sala de emergencias.  Si tiene Engineer, drilling, por favor llame inmediatamente al 911 o vaya a la sala de emergencias.  Nmeros de bper  - Dr. Gwen Pounds: [phone]  - Dra. Roseanne Reno: [phone]  - Dr. Katrinka Blazing: [phone]   En caso de inclemencias del tiempo, por favor llame a Lacy Duverney principal al [phone] para una actualizacin sobre el [place] de cualquier retraso o cierre.  Consejos para la medicacin en dermatologa: Por favor, guarde las cajas en las que vienen los medicamentos de uso tpico para ayudarle a seguir las instrucciones sobre dnde y cmo usarlos. Las farmacias  generalmente imprimen las instrucciones del medicamento slo en las cajas y no directamente en los tubos del [place].   Si su medicamento es muy caro, por favor, pngase en contacto con Rolm Gala llamando al [phone] y presione la opcin 4 o envenos un mensaje a travs de Clinical cytogeneticist.   No podemos decirle cul ser su copago por los medicamentos por adelantado ya que esto es diferente dependiendo de la cobertura de su seguro. Sin embargo, es posible que podamos encontrar un medicamento sustituto a Audiological scientist un formulario para que el seguro cubra el medicamento que se considera necesario.   Si se requiere una autorizacin previa para que su compaa de seguros [place] su medicamento, por favor permtanos de 1 a 2 das hbiles para completar [address]  Los precios de los medicamentos varan con frecuencia dependiendo del Environmental consultant de dnde se surte la receta y alguna farmacias pueden ofrecer precios ms baratos.  El sitio web www.goodrx.com tiene cupones para medicamentos de Health and safety inspector. Los precios aqu no tienen en cuenta lo que podra costar con la ayuda del seguro (puede ser ms barato con su seguro), pero el sitio web puede darle el precio si no utiliz Tourist information centre manager.  - Puede imprimir el cupn correspondiente y llevarlo con su receta a la farmacia.  - Tambin puede pasar por nuestra oficina durante el horario de atencin regular y Education officer, museum una tarjeta de cupones de GoodRx.  - Si necesita que su receta se enve electrnicamente a una farmacia diferente, informe a nuestra oficina a travs de MyChart de [place] o por telfono llamando al [phone] y presione la opcin 4.

## 2023-06-28 NOTE — Progress Notes (Signed)
Follow-Up Visit   Subjective  Leah Huber is a 82 y.o. female who presents for the following: Atopic Derm, pt has been off Dupixent since 04/2023, has not been itching, Alopecia scalp, Minoxidil 2.5mg  1 po qd, Finasteride 5mg  1 po qd, pt feels like still shedding a lot, but has improved some.  No side effects.   The following portions of the chart were reviewed this encounter and updated as appropriate: medications, allergies, medical history  Review of Systems:  No other skin or systemic complaints except as noted in HPI or Assessment and Plan.  Objective  Well appearing patient in no apparent distress; mood and affect are within normal limits.   A focused examination was performed of the following areas: Scalp, arms  Relevant exam findings are noted in the Assessment and Plan.    Assessment & Plan   ATOPIC DERMATITIS Trunk, extremities Exam: arms clear, mild xerosis <1% BSA  Chronic condition with duration or expected duration over one year. Currently well-controlled off of Dupixent.   Atopic dermatitis (eczema) is a chronic, relapsing, pruritic condition that can significantly affect quality of life. It is often associated with allergic rhinitis and/or asthma and can require treatment with topical medications, phototherapy, or in severe cases biologic injectable medication (Dupixent; Adbry) or Oral JAK inhibitors.   Treatment Plan: Cont Cerave / Cerave SA qd  Recommend gentle skin care.    ANDROGENETIC ALOPECIA (FEMALE PATTERN HAIR LOSS) BP today 94/62 Exam: Diffuse thinning of the crown and widening of the midline part with retention of the frontal hairline, improved as compared to previous photos.  BL temporal and frontal scalp has good regrowth.  Chronic and persistent condition with duration or expected duration over one year. Condition is improving with treatment but not currently at goal.   Female Androgenic Alopecia is a chronic condition related to  genetics and/or hormonal changes.  In women androgenetic alopecia is commonly associated with menopause but may occur any time after puberty.  It causes hair thinning primarily on the crown with widening of the part and temporal hairline recession.  Can use OTC Rogaine (minoxidil) 5% solution/foam as directed.  Oral treatments in female patients who have no contraindication may include : - Low dose oral minoxidil 1.25 - 5mg  daily - Spironolactone 50 - 100mg  bid - Finasteride 2.5 - 5 mg daily Adjunctive therapies include: - Low Level Laser Light Therapy (LLLT) - Platelet-rich plasma injections (PRP) - Hair Transplants or scalp reduction   Treatment Plan: Cont Minoxidil 2.5mg  1 po qd Cont Finasteride 5mg  1 po qd  Doses of minoxidil for hair loss are considered 'low dose'. This is because the doses used for hair loss are much lower than the doses which are used for conditions such as high blood pressure (hypertension). The doses used for hypertension are 10-40mg  per day.  Side effects are uncommon at the low doses (up to 2.5 mg/day) used to treat hair loss. Potential side effects, more commonly seen at higher doses, include: Increase in hair growth (hypertrichosis) elsewhere on face and body Temporary hair shedding upon starting medication which may last up to 4 weeks Ankle swelling, fluid retention, rapid weight gain more than 5 pounds Low blood pressure and feeling lightheaded or dizzy when standing up quickly Fast or irregular heartbeat Headaches   Long term medication management.  Patient is using long term (months to years) prescription medication  to control their dermatologic condition.  These medications require periodic monitoring to evaluate for efficacy and side  effects and may require periodic laboratory monitoring.      Return for 3-68m Alopecia f/u.  I, Ardis Rowan, RMA, am acting as scribe for Willeen Niece, MD .   Documentation: I have reviewed the above documentation for  accuracy and completeness, and I agree with the above.  Willeen Niece, MD

## 2023-08-03 ENCOUNTER — Other Ambulatory Visit: Payer: Self-pay | Admitting: Dermatology

## 2023-08-03 DIAGNOSIS — L659 Nonscarring hair loss, unspecified: Secondary | ICD-10-CM

## 2023-08-06 ENCOUNTER — Other Ambulatory Visit: Payer: Self-pay | Admitting: Dermatology

## 2023-08-06 DIAGNOSIS — L659 Nonscarring hair loss, unspecified: Secondary | ICD-10-CM

## 2023-08-18 ENCOUNTER — Telehealth: Payer: Self-pay | Admitting: Internal Medicine

## 2023-08-18 NOTE — Telephone Encounter (Signed)
Copied from CRM [id number]. Topic: Medicare AWV >> Aug 18, 2023  2:40 PM Payton Doughty wrote: Reason for CRM: Called LVM 08/18/2023 to schedule Annual Wellness Visit  Verlee Rossetti; Care Guide Ambulatory Clinical Support [place] [facility] Direct Dial: [phone]

## 2023-09-07 ENCOUNTER — Other Ambulatory Visit: Payer: Self-pay | Admitting: Internal Medicine

## 2023-09-07 DIAGNOSIS — E7800 Pure hypercholesterolemia, unspecified: Secondary | ICD-10-CM

## 2023-09-13 ENCOUNTER — Ambulatory Visit: Payer: Medicare Other | Admitting: Internal Medicine

## 2023-09-13 ENCOUNTER — Encounter: Payer: Self-pay | Admitting: Internal Medicine

## 2023-09-13 VITALS — BP 126/76 | HR 75 | Temp 97.7°F | Ht 64.0 in | Wt 148.0 lb

## 2023-09-13 DIAGNOSIS — R1031 Right lower quadrant pain: Secondary | ICD-10-CM

## 2023-09-13 DIAGNOSIS — I10 Essential (primary) hypertension: Secondary | ICD-10-CM

## 2023-09-13 DIAGNOSIS — I712 Thoracic aortic aneurysm, without rupture, unspecified: Secondary | ICD-10-CM

## 2023-09-13 DIAGNOSIS — K219 Gastro-esophageal reflux disease without esophagitis: Secondary | ICD-10-CM | POA: Diagnosis not present

## 2023-09-13 DIAGNOSIS — D692 Other nonthrombocytopenic purpura: Secondary | ICD-10-CM | POA: Diagnosis not present

## 2023-09-13 DIAGNOSIS — R8281 Pyuria: Secondary | ICD-10-CM | POA: Diagnosis not present

## 2023-09-13 DIAGNOSIS — W19XXXD Unspecified fall, subsequent encounter: Secondary | ICD-10-CM

## 2023-09-13 DIAGNOSIS — F439 Reaction to severe stress, unspecified: Secondary | ICD-10-CM | POA: Diagnosis not present

## 2023-09-13 DIAGNOSIS — Z860100 Personal history of colon polyps, unspecified: Secondary | ICD-10-CM

## 2023-09-13 DIAGNOSIS — E7800 Pure hypercholesterolemia, unspecified: Secondary | ICD-10-CM

## 2023-09-13 DIAGNOSIS — I4891 Unspecified atrial fibrillation: Secondary | ICD-10-CM | POA: Diagnosis not present

## 2023-09-13 DIAGNOSIS — N1832 Chronic kidney disease, stage 3b: Secondary | ICD-10-CM | POA: Diagnosis not present

## 2023-09-13 NOTE — Progress Notes (Signed)
 Subjective:    Patient ID: DAROTHY COURTRIGHT, female    DOB: Jan 30, 1941, 83 y.o.   MRN: [id number]  Patient here for  Chief Complaint  Patient presents with   Medical Management of Chronic Issues    HPI Here for a scheduled follow up. Seeing Dr Rennie - last 05/24/23 - recommended to proceed with iron  infusion. Continues on oral B12 every other day. Had f/u with GI 04/01/23. Recommended continuing protonix . Had f/u with nephrology 03/03/23 - stable. Saw cardiology 01/21/23 - recommended continuing on toprol, eliquis. Recommended echo. S/p echo 01/2023 - moderate TR, mild MR, PFO with Lto R shunting, mild to moderate PH with EF >55%. From cardiac standpoint, she feels things are stable. Breathing stable. No increased cough or congestion. She had a recent fall. Was going down step from her kitchen into her garage. Fell - on her knees. No head injury. Denies any significant residual problems from the fall. No dizziness or light headedness. Does report increased right lower and suprapubic abdominal discomfort.  Bowels are moving. She has noticed some discomfort with urination. No hematuria.    Past Medical History:  Diagnosis Date   Abnormal liver function test 07/23/2019   Anemia    Aortic aneurysm (HCC)    SMALL   Arthritis    neck to tailbone   Bilateral carpal tunnel syndrome 12/08/2017   Bronchitis    Chronic cough    @ NIGHT/ SEASONAL ALLERGIES   Clostridium difficile infection 2013-2014   Colon polyp    Concussion    Coronary artery disease    Dr Hester cardiologist   Depression    Diverticulosis    GERD (gastroesophageal reflux disease)    Glaucoma    Gout    Hemorrhoids    History of chicken pox    History of colonic polyps    History of shingles    Hypercholesterolemia    Hypertension    CONTROLLED ON MEDS   Hypovolemia 12/10   acute renal failure   IBS (irritable bowel syndrome)    Neuromuscular disorder (HCC)    occasionally tingling in hands/ NUMBNESS IN RIGHT  FOOT s/p back surgery   Osteoarthritis    bilateral knees   Osteopenia    Pneumonia    HX OF   Sleep apnea    very mild   sleep study 8 yrs ago does not use CPAP   Urinary incontinence    Past Surgical History:  Procedure Laterality Date   ABDOMINAL HYSTERECTOMY  1979   ANTERIOR CERVICAL DECOMP/DISCECTOMY FUSION N/A 11/28/2014   Procedure: CERVICAL FIVE-SIX  ANTERIOR CERVICAL DECOMPRESSION/DISCECTOMY FUSION 1 LEVEL;  Surgeon: Reyes Budge, MD;  Location: [facility];  Service: Neurosurgery;  Laterality: N/A;  C56 anterior cervical decompression with fusion interbody prosthesis plating and bonegraft   BACK SURGERY     X2/ L4,5 & S1/ Cervical C7?   BLADDER SURGERY  1981   SUSPENSION   BREAST REDUCTION SURGERY     CARDIOVERSION N/A 10/29/2020   Procedure: CARDIOVERSION;  Surgeon: Hester Wolm PARAS, MD;  Location: [facility];  Service: Cardiovascular;  Laterality: N/A;   CARPAL TUNNEL RELEASE  2007   CATARACT EXTRACTION W/PHACO Right 12/30/2016   Procedure: CATARACT EXTRACTION PHACO AND INTRAOCULAR LENS PLACEMENT (IOC)  Right toric lens;  Surgeon: Dene Etienne, MD;  Location: [facility];  Service: Ophthalmology;  Laterality: Right;  sleep apnea, does not use CPAP   CATARACT EXTRACTION W/PHACO Left 01/20/2017   Procedure: CATARACT EXTRACTION PHACO AND  INTRAOCULAR LENS PLACEMENT (IOC)  Left Toric;  Surgeon: Mittie Gaskin, MD;  Location: [facility];  Service: Ophthalmology;  Laterality: Left;  toric lens   CHOLECYSTECTOMY  1996   COLONOSCOPY     COLONOSCOPY WITH PROPOFOL  N/A 05/22/2019   Procedure: COLONOSCOPY WITH PROPOFOL ;  Surgeon: Toledo, Ladell POUR, MD;  Location: [facility];  Service: Gastroenterology;  Laterality: N/A;   FOOT SURGERY  2005   JOINT REPLACEMENT Bilateral    KNEE   LAMINECTOMY  1983 & 1985   REDUCTION MAMMAPLASTY Bilateral 2001   ROTATOR CUFF REPAIR  2000 & 2004   left and right   SPINAL CORD STIMULATOR IMPLANT  03/1997   SPINAL CORD  STIMULATOR REMOVAL  2007   Tendon repair right shoulder     TONSILLECTOMY     TOTAL KNEE ARTHROPLASTY  10/2011 & 09/13   left and right   Family History  Problem Relation Age of Onset   Heart disease Mother    Stroke Mother    Hypertension Mother    Hyperlipidemia Father    Heart disease Father    Heart disease Brother        2 brothers   Alzheimer's disease Brother    Heart disease Sister        pacemaker   Breast cancer Daughter 66   Social History   Socioeconomic History   Marital status: Widowed    Spouse name: Not on file   Number of children: 2   Years of education: Not on file   Highest education level: 12th grade  Occupational History   Not on file  Tobacco Use   Smoking status: Former    Current packs/day: 0.00    Average packs/day: 1 pack/day for 20.0 years (20.0 ttl pk-yrs)    Types: Cigarettes    Start date: 09/07/1968    Quit date: 09/07/1988    Years since quitting: 35.0   Smokeless tobacco: Never  Vaping Use   Vaping status: Never Used  Substance and Sexual Activity   Alcohol use: Yes    Alcohol/week: 2.0 standard drinks of alcohol    Types: 2 Glasses of wine per week    Comment: occasional   Drug use: No   Sexual activity: Never  Other Topics Concern   Not on file  Social History Narrative   She is widowed, has two daughters. [place]; quit smoking- in 1990s; social alcohol.    Social Drivers of Corporate Investment Banker Strain: Low Risk  (09/09/2023)   Overall Financial Resource Strain (CARDIA)    Difficulty of Paying Living Expenses: Not hard at all  Food Insecurity: No Food Insecurity (09/09/2023)   Hunger Vital Sign    Worried About Running Out of Food in the Last Year: Never true    Ran Out of Food in the Last Year: Never true  Transportation Needs: No Transportation Needs (09/09/2023)   PRAPARE - Administrator, Civil Service (Medical): No    Lack of Transportation (Non-Medical): No  Physical Activity: Insufficiently Active  (09/09/2023)   Exercise Vital Sign    Days of Exercise per Week: 3 days    Minutes of Exercise per Session: 20 min  Stress: No Stress Concern Present (09/09/2023)   [employer] of Occupational Health - Occupational Stress Questionnaire    Feeling of Stress : Not at all  Social Connections: Moderately Integrated (09/09/2023)   Social Connection and Isolation Panel [NHANES]    Frequency of Communication with Friends and  Family: More than three times a week    Frequency of Social Gatherings with Friends and Family: More than three times a week    Attends Religious Services: More than 4 times per year    Active Member of Clubs or Organizations: Yes    Attends Banker Meetings: More than 4 times per year    Marital Status: Widowed     Review of Systems  Constitutional:  Negative for appetite change, fever and unexpected weight change.  HENT:  Negative for congestion and sinus pressure.   Respiratory:  Negative for cough and chest tightness.        Breathing stable.   Cardiovascular:  Negative for chest pain and palpitations.  Gastrointestinal:  Negative for diarrhea, nausea and vomiting.       Lower abdominal pain/suprapubic discomfort as outlined.   Genitourinary:  Negative for difficulty urinating and dysuria.  Musculoskeletal:  Negative for joint swelling and myalgias.  Skin:  Negative for color change and rash.  Neurological:  Negative for dizziness and headaches.  Psychiatric/Behavioral:  Negative for agitation and dysphoric mood.        Objective:     BP 126/76   Pulse 75   Temp 97.7 F (36.5 C)   Ht 5' 4 (1.626 m)   Wt 148 lb (67.1 kg)   LMP 08/15/1978   SpO2 99%   BMI 25.40 kg/m  Wt Readings from Last 3 Encounters:  09/13/23 148 lb (67.1 kg)  05/24/23 146 lb 12.8 oz (66.6 kg)  05/14/23 147 lb 3.2 oz (66.8 kg)    Physical Exam Vitals reviewed.  Constitutional:      General: She is not in acute distress.    Appearance: Normal appearance.  HENT:      Head: Normocephalic and atraumatic.     Right Ear: External ear normal.     Left Ear: External ear normal.     Mouth/Throat:     Pharynx: No oropharyngeal exudate or posterior oropharyngeal erythema.  Eyes:     General: No scleral icterus.       Right eye: No discharge.        Left eye: No discharge.     Conjunctiva/sclera: Conjunctivae normal.  Neck:     Thyroid : No thyromegaly.  Cardiovascular:     Rate and Rhythm: Normal rate and regular rhythm.  Pulmonary:     Effort: No respiratory distress.     Breath sounds: Normal breath sounds. No wheezing.  Abdominal:     General: Bowel sounds are normal.     Palpations: Abdomen is soft.     Comments: Tenderness to palpation - lower abdomen, suprapubic region. Could not appreciate hernia.   Musculoskeletal:        General: No swelling or tenderness.     Cervical back: Neck supple. No tenderness.  Lymphadenopathy:     Cervical: No cervical adenopathy.  Skin:    Findings: No erythema or rash.  Neurological:     Mental Status: She is alert.  Psychiatric:        Mood and Affect: Mood normal.        Behavior: Behavior normal.      Outpatient Encounter Medications as of 09/13/2023  Medication Sig   cefdinir  (OMNICEF ) 300 MG capsule Take 1 capsule (300 mg total) by mouth 2 (two) times daily.   cyanocobalamin  1000 MCG tablet Take 1,000 mcg by mouth.   ELIQUIS 2.5 MG TABS tablet Take 2.5 mg by mouth 2 (two) times  daily.   finasteride  (PROSCAR ) 5 MG tablet TAKE 1 TABLET DAILY   latanoprost (XALATAN) 0.005 % ophthalmic solution Place 1 drop into both eyes at bedtime.    Lifitegrast (XIIDRA) 5 % SOLN Place 1 drop into both eyes daily.   losartan  (COZAAR ) 25 MG tablet 25 mg daily.   metoprolol succinate (TOPROL-XL) 25 MG 24 hr tablet Take 25 mg by mouth in the morning and at bedtime.   minoxidil  (LONITEN ) 2.5 MG tablet TAKE 1 TABLET DAILY   pantoprazole  (PROTONIX ) 20 MG tablet One tablet qod   rosuvastatin  (CRESTOR ) 10 MG tablet TAKE  1 TABLET DAILY   tolterodine  (DETROL  LA) 4 MG 24 hr capsule Take 1 capsule (4 mg total) by mouth daily.   Facility-Administered Encounter Medications as of 09/13/2023  Medication   Tralokinumab -ldrm SOSY 300 mg     Lab Results  Component Value Date   WBC 4.9 05/24/2023   HGB 10.0 (L) 05/24/2023   HCT 31.4 (L) 05/24/2023   PLT 332 05/24/2023   GLUCOSE 91 09/13/2023   CHOL 194 09/13/2023   TRIG 120.0 09/13/2023   HDL 101.10 09/13/2023   LDLDIRECT 145.0 01/06/2021   LDLCALC 69 09/13/2023   ALT 15 09/13/2023   AST 25 09/13/2023   NA 137 09/13/2023   K 4.3 09/13/2023   CL 103 09/13/2023   CREATININE 1.20 09/13/2023   BUN 29 (H) 09/13/2023   CO2 24 09/13/2023   TSH 1.67 09/13/2023   INR 1.0 10/16/2013   HGBA1C 5.5 07/18/2019    MM 3D SCREEN BREAST BILATERAL Result Date: 06/03/2023 CLINICAL DATA:  Screening. EXAM: DIGITAL SCREENING BILATERAL MAMMOGRAM WITH TOMOSYNTHESIS AND CAD TECHNIQUE: Bilateral screening digital craniocaudal and mediolateral oblique mammograms were obtained. Bilateral screening digital breast tomosynthesis was performed. The images were evaluated with computer-aided detection. COMPARISON:  Previous exam(s). ACR Breast Density Category b: There are scattered areas of fibroglandular density. FINDINGS: There are no findings suspicious for malignancy. IMPRESSION: No mammographic evidence of malignancy. A result letter of this screening mammogram will be mailed directly to the patient. RECOMMENDATION: Screening mammogram in one year. (Code:SM-B-01Y) BI-RADS CATEGORY  1: Negative. Electronically Signed   By: Dobrinka  Dimitrova M.D.   On: 06/03/2023 12:13       Assessment & Plan:  Atrial fibrillation, unspecified type [facility]) Assessment & Plan: On metoprolol.  On eliquis. S/P ablation in September. Overall appears to be stable.      Orders: -     TSH  Right lower quadrant pain Assessment & Plan: Right lower quadrant pain and suprapubic pain as outlined.   Persistent. Intermittent - worsening - flares. No hematuria. Some minimal discomfort with urination. No vomiting. Bowels are moving. Could not appreciate hernia on exam. Check labs, including cbc, met b and liver panel.  Also check urine to confirm no infection. Discussed given persistent increased pain, CT to further evaluate.   Orders: -     Urinalysis, Routine w reflex microscopic  Hypercholesterolemia Assessment & Plan: On crestor .  Low cholesterol diet and exercise.  Follow lipid panel and liver function tests.    Orders: -     Basic metabolic panel -     Hepatic function panel -     Lipid panel  Pyuria -     Urine Culture; Future  Stress Assessment & Plan: Overall appears to be handling things well.  Follow.    Other nonthrombocytopenic purpura (HCC) Assessment & Plan: History of afib.  On eliquis.    Primary hypertension Assessment &  Plan: Continue on losartan  and metoprolol.  Blood pressure doing well.  Same medication regimen.  Follow.     History of colonic polyps Assessment & Plan: Colonoscopy September 2020-1 tubular adenomatous polyp (hepatic flexure), diverticulosis and internal hemorrhoids.   Gastroesophageal reflux disease, unspecified whether esophagitis present Assessment & Plan: On protonix  20mg  qod now.  Symptoms controlled.  Follow.    Fall, subsequent encounter Assessment & Plan: Recent fall as outlined.  Denies any syncope or near syncope.  No LOC.  No head injury. Follow.    Stage 3b chronic kidney disease (HCC) Assessment & Plan: Avoid nephrotoxins.  On losartan . Follow pressures.  Follow metabolic panel.   Continue f/u with nephrology.    Thoracic aortic aneurysm without rupture, unspecified part [facility]) Assessment & Plan: Has been followed by cardiology.    Right lower quadrant abdominal pain -     CT ABDOMEN PELVIS WO CONTRAST; Future  Other orders -     Cefdinir ; Take 1 capsule (300 mg total) by mouth 2 (two) times daily.   Dispense: 6 capsule; Refill: 0     Allena Hamilton, MD

## 2023-09-14 ENCOUNTER — Ambulatory Visit: Payer: Medicare Other

## 2023-09-14 DIAGNOSIS — R8281 Pyuria: Secondary | ICD-10-CM | POA: Diagnosis not present

## 2023-09-14 LAB — LIPID PANEL
Cholesterol: 194 mg/dL (ref 0–200)
HDL: 101.1 mg/dL (ref >39.00)
LDL Cholesterol: 69 mg/dL (ref 0–99)
NonHDL: 93.28
Total CHOL/HDL Ratio: 2
Triglycerides: 120 mg/dL (ref 0.0–149.0)
VLDL: 24 mg/dL (ref 0.0–40.0)

## 2023-09-14 LAB — HEPATIC FUNCTION PANEL
ALT: 15 U/L (ref 0–35)
AST: 25 U/L (ref 0–37)
Albumin: 4.6 g/dL (ref 3.5–5.2)
Alkaline Phosphatase: 66 U/L (ref 39–117)
Bilirubin, Direct: 0.1 mg/dL (ref 0.0–0.3)
Total Bilirubin: 0.5 mg/dL (ref 0.2–1.2)
Total Protein: 7 g/dL (ref 6.0–8.3)

## 2023-09-14 LAB — URINALYSIS, ROUTINE W REFLEX MICROSCOPIC
Bilirubin Urine: NEGATIVE
Ketones, ur: NEGATIVE
Nitrite: POSITIVE — AB
Specific Gravity, Urine: 1.025 (ref 1.000–1.030)
Total Protein, Urine: NEGATIVE
Urine Glucose: NEGATIVE
Urobilinogen, UA: 0.2 (ref 0.0–1.0)
pH: 5.5 (ref 5.0–8.0)

## 2023-09-14 LAB — BASIC METABOLIC PANEL
BUN: 29 mg/dL — ABNORMAL HIGH (ref 6–23)
CO2: 24 meq/L (ref 19–32)
Calcium: 9.6 mg/dL (ref 8.4–10.5)
Chloride: 103 meq/L (ref 96–112)
Creatinine, Ser: 1.2 mg/dL (ref 0.40–1.20)
GFR: 41.78 mL/min — ABNORMAL LOW (ref >60.00)
Glucose, Bld: 91 mg/dL (ref 70–99)
Potassium: 4.3 meq/L (ref 3.5–5.1)
Sodium: 137 meq/L (ref 135–145)

## 2023-09-14 LAB — TSH: TSH: 1.67 u[IU]/mL (ref 0.35–5.50)

## 2023-09-15 ENCOUNTER — Encounter: Payer: Self-pay | Admitting: *Deleted

## 2023-09-16 LAB — URINE CULTURE
MICRO NUMBER:: 15927268
SPECIMEN QUALITY:: ADEQUATE

## 2023-09-16 MED ORDER — CEFDINIR 300 MG PO CAPS: 300.0000 mg | ORAL_CAPSULE | Freq: Two times a day (BID) | ORAL | 0 refills | Status: DC

## 2023-09-18 ENCOUNTER — Encounter: Payer: Self-pay | Admitting: Internal Medicine

## 2023-09-18 NOTE — Assessment & Plan Note (Signed)
On protonix '20mg'$  qod now.  Symptoms controlled.  Follow.

## 2023-09-18 NOTE — Assessment & Plan Note (Signed)
Has been followed by cardiology.  

## 2023-09-18 NOTE — Assessment & Plan Note (Signed)
Continue on losartan and metoprolol.  Blood pressure doing well.  Same medication regimen.  Follow.   

## 2023-09-18 NOTE — Assessment & Plan Note (Addendum)
 Avoid nephrotoxins.  On losartan. Follow pressures.  Follow metabolic panel.   Continue f/u with nephrology.

## 2023-09-18 NOTE — Assessment & Plan Note (Signed)
 On crestor.  Low cholesterol diet and exercise.  Follow lipid panel and liver function tests.

## 2023-09-18 NOTE — Assessment & Plan Note (Signed)
 Recent fall as outlined.  Denies any syncope or near syncope.  No LOC.  No head injury. Follow.

## 2023-09-18 NOTE — Assessment & Plan Note (Signed)
On metoprolol.  On eliquis. S/P ablation in September. Overall appears to be stable.

## 2023-09-18 NOTE — Assessment & Plan Note (Signed)
 Right lower quadrant pain and suprapubic pain as outlined.  Persistent. Intermittent - worsening - flares. No hematuria. Some minimal discomfort with urination. No vomiting. Bowels are moving. Could not appreciate hernia on exam. Check labs, including cbc, met b and liver panel.  Also check urine to confirm no infection. Discussed given persistent increased pain, CT to further evaluate.

## 2023-09-18 NOTE — Assessment & Plan Note (Signed)
History of afib.  On eliquis.   

## 2023-09-18 NOTE — Assessment & Plan Note (Signed)
Colonoscopy September 2020-1 tubular adenomatous polyp (hepatic flexure), diverticulosis and internal hemorrhoids. 

## 2023-09-18 NOTE — Assessment & Plan Note (Signed)
 Overall appears to be handling things well.  Follow.  ?

## 2023-09-22 ENCOUNTER — Encounter: Payer: Self-pay | Admitting: Internal Medicine

## 2023-09-22 ENCOUNTER — Inpatient Hospital Stay: Payer: Medicare Other | Attending: Internal Medicine

## 2023-09-22 ENCOUNTER — Inpatient Hospital Stay: Payer: Medicare Other

## 2023-09-22 ENCOUNTER — Inpatient Hospital Stay (HOSPITAL_BASED_OUTPATIENT_CLINIC_OR_DEPARTMENT_OTHER): Payer: Medicare Other | Admitting: Internal Medicine

## 2023-09-22 VITALS — BP 115/52 | HR 70

## 2023-09-22 VITALS — BP 123/58 | HR 80 | Temp 96.9°F | Ht 64.0 in | Wt 150.4 lb

## 2023-09-22 DIAGNOSIS — D509 Iron deficiency anemia, unspecified: Secondary | ICD-10-CM | POA: Diagnosis not present

## 2023-09-22 DIAGNOSIS — D649 Anemia, unspecified: Secondary | ICD-10-CM

## 2023-09-22 DIAGNOSIS — E611 Iron deficiency: Secondary | ICD-10-CM

## 2023-09-22 DIAGNOSIS — N183 Chronic kidney disease, stage 3 unspecified: Secondary | ICD-10-CM | POA: Insufficient documentation

## 2023-09-22 DIAGNOSIS — I129 Hypertensive chronic kidney disease with stage 1 through stage 4 chronic kidney disease, or unspecified chronic kidney disease: Secondary | ICD-10-CM | POA: Diagnosis not present

## 2023-09-22 LAB — BASIC METABOLIC PANEL - CANCER CENTER ONLY
Anion gap: 9 (ref 5–15)
BUN: 30 mg/dL — ABNORMAL HIGH (ref 8–23)
CO2: 22 mmol/L (ref 22–32)
Calcium: 9.5 mg/dL (ref 8.9–10.3)
Chloride: 105 mmol/L (ref 98–111)
Creatinine: 1.08 mg/dL — ABNORMAL HIGH (ref 0.44–1.00)
GFR, Estimated: 51 mL/min — ABNORMAL LOW (ref >60)
Glucose, Bld: 96 mg/dL (ref 70–99)
Potassium: 4.3 mmol/L (ref 3.5–5.1)
Sodium: 136 mmol/L (ref 135–145)

## 2023-09-22 LAB — CBC (CANCER CENTER ONLY)
HCT: 34.2 % — ABNORMAL LOW (ref 36.0–46.0)
Hemoglobin: 11.2 g/dL — ABNORMAL LOW (ref 12.0–15.0)
MCH: 33.4 pg (ref 26.0–34.0)
MCHC: 32.7 g/dL (ref 30.0–36.0)
MCV: 102.1 fL — ABNORMAL HIGH (ref 80.0–100.0)
Platelet Count: 293 10*3/uL (ref 150–400)
RBC: 3.35 MIL/uL — ABNORMAL LOW (ref 3.87–5.11)
RDW: 12.5 % (ref 11.5–15.5)
WBC Count: 6 10*3/uL (ref 4.0–10.5)
nRBC: 0 % (ref 0.0–0.2)

## 2023-09-22 LAB — IRON AND TIBC
Iron: 112 ug/dL (ref 28–170)
Saturation Ratios: 26 % (ref 10.4–31.8)
TIBC: 431 ug/dL (ref 250–450)
UIBC: 319 ug/dL

## 2023-09-22 LAB — LACTATE DEHYDROGENASE: LDH: 194 U/L — ABNORMAL HIGH (ref 98–192)

## 2023-09-22 LAB — FERRITIN: Ferritin: 28 ng/mL (ref 11–307)

## 2023-09-22 LAB — VITAMIN B12: Vitamin B-12: 639 pg/mL (ref 180–914)

## 2023-09-22 MED ORDER — SODIUM CHLORIDE 0.9% FLUSH
10.0000 mL | Freq: Once | INTRAVENOUS | Status: AC | PRN
Start: 2023-09-22 — End: 2023-09-22
  Administered 2023-09-22: 10 mL
  Filled 2023-09-22: qty 10

## 2023-09-22 MED ORDER — IRON SUCROSE 20 MG/ML IV SOLN
200.0000 mg | Freq: Once | INTRAVENOUS | Status: AC
Start: 2023-09-22 — End: 2023-09-22
  Administered 2023-09-22: 200 mg via INTRAVENOUS
  Filled 2023-09-22: qty 10

## 2023-09-22 NOTE — Progress Notes (Signed)
 Fatigue/weakness: yes Dyspena: no Light headedness: no Blood in stool: no  CT Abd/Pelvis tomorrow due to pain.

## 2023-09-22 NOTE — Assessment & Plan Note (Addendum)
#  Anemia-iron  deficiency; - nadir July 2022- Hemoglobin improved from 7 to 12.  INTOLERANCE PO gentle iron / constipation [needing enema].  Proceed with iron  infusion today- stable.   # Macrocytosis- ? unclear etiology- B12 JAN 2024- > 1000; recommend qOD on b12 pills;  / CKD vs others.  Discussed re: bone marrow dysfunction, but HOLD off  bone marrow biopsy at his time. Etiology of iron  deficiency: [Oct 2022] s/p [facility] evaluation; s/p  barium esophagogram.  #A. fib [Dr.Thomas; [facility] ]-on Eliquis 2.5 mg twice daily- s/p ablation [[facility]-Oct 2022]- stable  # CKD- Stage III- GFR 46- stable. [Dr.korrapati]  # DISPOSITION: # venofer  today;  # follow up in 3 month-  MD; labs- cbc/bmp/iron  studies/ferritin; LDH;B12 level- Possible venofer -Dr.B

## 2023-09-22 NOTE — Progress Notes (Signed)
 Patient declined to wait the 30 minutes for post iron infusion observation today. Tolerated infusion well. VSS.

## 2023-09-22 NOTE — Progress Notes (Signed)
 [facility] CONSULT NOTE  Patient Care Team: Dellar Fenton, MD as PCP - General (Internal Medicine) Cassie Click, MD as Consulting Physician (Gastroenterology) Gwyn Leos, MD as Consulting Physician (Internal Medicine)  CHIEF COMPLAINTS/PURPOSE OF CONSULTATION: ANEMIA  HEMATOLOGY HISTORY:  # AUG 2022:  SEVERE ANEMIA [hb-7; ]  EGD/Colonoscopy:2020- last colo-44mm [facility]; Dr.Toledo]; Esophagogram- 2022- NEG for stricture. ? capsule  # CKD stage III [Dr.Korrapti]; A.fib [on eliquis on 2.5 mg BID;  Dr.Kowalski]   HISTORY OF PRESENTING ILLNESS: Alone. Ambulating independently.  Barabara Boning 83 y.o.  female iron  deficient/macrocytic anemia of unclear etiology- / CKD-III; A-fib on Eliquis is here for follow-up.   Patient complains of ongoing fatigue.  Occasional dizziness.  No blood in stools of blood closely.  Complains of chronic mild  shortness of breath on exertion.  She is not on oral iron  given history of constipation.  Review of Systems  Constitutional:  Positive for malaise/fatigue. Negative for chills, diaphoresis, fever and weight loss.  HENT:  Negative for nosebleeds and sore throat.   Eyes:  Negative for double vision.  Respiratory:  Negative for cough, hemoptysis, sputum production and wheezing.   Cardiovascular:  Negative for chest pain, palpitations, orthopnea and leg swelling.  Gastrointestinal:  Positive for constipation. Negative for blood in stool, heartburn, melena and vomiting.  Genitourinary:  Negative for dysuria, frequency and urgency.  Musculoskeletal:  Positive for back pain and joint pain.  Skin: Negative.  Negative for itching and rash.  Neurological:  Negative for tingling, focal weakness, weakness and headaches.  Endo/Heme/Allergies:  Does not bruise/bleed easily.  Psychiatric/Behavioral:  Negative for depression. The patient is not nervous/anxious and does not have insomnia.     MEDICAL HISTORY:  Past Medical History:   Diagnosis Date   Abnormal liver function test 07/23/2019   Anemia    Aortic aneurysm (HCC)    "SMALL"   Arthritis    neck to tailbone   Bilateral carpal tunnel syndrome 12/08/2017   Bronchitis    Chronic cough    @ NIGHT/ SEASONAL ALLERGIES   Clostridium difficile infection 2013-2014   Colon polyp    Concussion    Coronary artery disease    Dr Bary Likes cardiologist   Depression    Diverticulosis    GERD (gastroesophageal reflux disease)    Glaucoma    Gout    Hemorrhoids    History of chicken pox    History of colonic polyps    History of shingles    Hypercholesterolemia    Hypertension    CONTROLLED ON MEDS   Hypovolemia 12/10   acute renal failure   IBS (irritable bowel syndrome)    Neuromuscular disorder (HCC)    occasionally tingling in hands/ NUMBNESS IN RIGHT FOOT s/p back surgery   Osteoarthritis    bilateral knees   Osteopenia    Pneumonia    HX OF   Sleep apnea    very mild   sleep study 8 yrs ago does not use CPAP   Urinary incontinence     SURGICAL HISTORY: Past Surgical History:  Procedure Laterality Date   ABDOMINAL HYSTERECTOMY  1979   ANTERIOR CERVICAL DECOMP/DISCECTOMY FUSION N/A 11/28/2014   Procedure: CERVICAL FIVE-SIX  ANTERIOR CERVICAL DECOMPRESSION/DISCECTOMY FUSION 1 LEVEL;  Surgeon: Garry Kansas, MD;  Location: [facility];  Service: Neurosurgery;  Laterality: N/A;  C56 anterior cervical decompression with fusion interbody prosthesis plating and bonegraft   BACK SURGERY     X2/ L4,5 & S1/ Cervical  C7?   BLADDER SURGERY  1981   SUSPENSION   BREAST REDUCTION SURGERY     CARDIOVERSION N/A 10/29/2020   Procedure: CARDIOVERSION;  Surgeon: Michelle Aid, MD;  Location: [facility];  Service: Cardiovascular;  Laterality: N/A;   CARPAL TUNNEL RELEASE  2007   CATARACT EXTRACTION W/PHACO Right 12/30/2016   Procedure: CATARACT EXTRACTION PHACO AND INTRAOCULAR LENS PLACEMENT (IOC)  Right toric lens;  Surgeon: Annell Kidney, MD;  Location:  [facility];  Service: Ophthalmology;  Laterality: Right;  sleep apnea, does not use CPAP   CATARACT EXTRACTION W/PHACO Left 01/20/2017   Procedure: CATARACT EXTRACTION PHACO AND INTRAOCULAR LENS PLACEMENT (IOC)  Left Toric;  Surgeon: Annell Kidney, MD;  Location: [facility];  Service: Ophthalmology;  Laterality: Left;  toric lens   CHOLECYSTECTOMY  1996   COLONOSCOPY     COLONOSCOPY WITH PROPOFOL  N/A 05/22/2019   Procedure: COLONOSCOPY WITH PROPOFOL ;  Surgeon: Toledo, Alphonsus Jeans, MD;  Location: [facility];  Service: Gastroenterology;  Laterality: N/A;   FOOT SURGERY  2005   JOINT REPLACEMENT Bilateral    KNEE   LAMINECTOMY  1983 & 1985   REDUCTION MAMMAPLASTY Bilateral 2001   ROTATOR CUFF REPAIR  2000 & 2004   left and right   SPINAL CORD STIMULATOR IMPLANT  03/1997   SPINAL CORD STIMULATOR REMOVAL  2007   Tendon repair right shoulder     TONSILLECTOMY     TOTAL KNEE ARTHROPLASTY  10/2011 & 09/13   left and right    SOCIAL HISTORY: Social History   Socioeconomic History   Marital status: Widowed    Spouse name: Not on file   Number of children: 2   Years of education: Not on file   Highest education level: 12th grade  Occupational History   Not on file  Tobacco Use   Smoking status: Former    Current packs/day: 0.00    Average packs/day: 1 pack/day for 20.0 years (20.0 ttl pk-yrs)    Types: Cigarettes    Start date: 09/07/1968    Quit date: 09/07/1988    Years since quitting: 35.0   Smokeless tobacco: Never  Vaping Use   Vaping status: Never Used  Substance and Sexual Activity   Alcohol use: Yes    Alcohol/week: 2.0 standard drinks of alcohol    Types: 2 Glasses of wine per week    Comment: occasional   Drug use: No   Sexual activity: Never  Other Topics Concern   Not on file  Social History Narrative   She is widowed, has two daughters. [place]; quit smoking- in 1990s; social alcohol.    Social Drivers of Corporate investment banker  Strain: Low Risk  (09/09/2023)   Overall Financial Resource Strain (CARDIA)    Difficulty of Paying Living Expenses: Not hard at all  Food Insecurity: No Food Insecurity (09/09/2023)   Hunger Vital Sign    Worried About Running Out of Food in the Last Year: Never true    Ran Out of Food in the Last Year: Never true  Transportation Needs: No Transportation Needs (09/09/2023)   PRAPARE - Administrator, Civil Service (Medical): No    Lack of Transportation (Non-Medical): No  Physical Activity: Insufficiently Active (09/09/2023)   Exercise Vital Sign    Days of Exercise per Week: 3 days    Minutes of Exercise per Session: 20 min  Stress: No Stress Concern Present (09/09/2023)   [employer] of Occupational Health - Occupational  Stress Questionnaire    Feeling of Stress : Not at all  Social Connections: Moderately Integrated (09/09/2023)   Social Connection and Isolation Panel [NHANES]    Frequency of Communication with Friends and Family: More than three times a week    Frequency of Social Gatherings with Friends and Family: More than three times a week    Attends Religious Services: More than 4 times per year    Active Member of [employer] or Organizations: Yes    Attends Banker Meetings: More than 4 times per year    Marital Status: Widowed  Intimate Partner Violence: Not At Risk (08/27/2022)   Humiliation, Afraid, Rape, and Kick questionnaire    Fear of Current or Ex-Partner: No    Emotionally Abused: No    Physically Abused: No    Sexually Abused: No    FAMILY HISTORY: Family History  Problem Relation Age of Onset   Heart disease Mother    Stroke Mother    Hypertension Mother    Hyperlipidemia Father    Heart disease Father    Heart disease Brother        2 brothers   Alzheimer's disease Brother    Heart disease Sister        pacemaker   Breast cancer Daughter 53    ALLERGIES:  is allergic to clindamycin/lincomycin, dronedarone, bactrim  [sulfamethoxazole-trimethoprim], morphine , morphine  and codeine, and sulfa antibiotics.  MEDICATIONS:  Current Outpatient Medications  Medication Sig Dispense Refill   cyanocobalamin  1000 MCG tablet Take 1,000 mcg by mouth.     ELIQUIS 2.5 MG TABS tablet Take 2.5 mg by mouth 2 (two) times daily.     finasteride  (PROSCAR ) 5 MG tablet TAKE 1 TABLET DAILY 90 tablet 3   latanoprost (XALATAN) 0.005 % ophthalmic solution Place 1 drop into both eyes at bedtime.      Lifitegrast (XIIDRA) 5 % SOLN Place 1 drop into both eyes daily.     losartan  (COZAAR ) 25 MG tablet 25 mg daily.     metoprolol succinate (TOPROL-XL) 25 MG 24 hr tablet Take 25 mg by mouth in the morning and at bedtime.     minoxidil  (LONITEN ) 2.5 MG tablet TAKE 1 TABLET DAILY 90 tablet 3   pantoprazole  (PROTONIX ) 20 MG tablet One tablet qod 90 tablet 2   rosuvastatin  (CRESTOR ) 10 MG tablet TAKE 1 TABLET DAILY 90 tablet 3   tolterodine  (DETROL  LA) 4 MG 24 hr capsule Take 1 capsule (4 mg total) by mouth daily. 90 capsule 3   Current Facility-Administered Medications  Medication Dose Route Frequency Provider Last Rate Last Admin   Tralokinumab -ldrm SOSY 300 mg  300 mg Subcutaneous Once Artemio Larry, MD       Facility-Administered Medications Ordered in Other Visits  Medication Dose Route Frequency Provider Last Rate Last Admin   iron  sucrose (VENOFER ) injection 200 mg  200 mg Intravenous Once Lilliane Sposito R, MD       sodium chloride  flush (NS) 0.9 % injection 10 mL  10 mL Intracatheter Once PRN Aparna Vanderweele R, MD          PHYSICAL EXAMINATION:   Vitals:   09/22/23 0937  BP: (!) 123/58  Pulse: 80  Temp: (!) 96.9 F (36.1 C)  SpO2: 100%   Filed Weights   09/22/23 0937  Weight: 150 lb 6.4 oz (68.2 kg)     Physical Exam Vitals and nursing note reviewed.  HENT:     Head: Normocephalic and atraumatic.  Mouth/Throat:     Pharynx: Oropharynx is clear.  Eyes:     Extraocular Movements: Extraocular  movements intact.     Pupils: Pupils are equal, round, and reactive to light.  Cardiovascular:     Rate and Rhythm: Normal rate and regular rhythm.  Pulmonary:     Comments: Decreased breath sounds bilaterally.  Abdominal:     Palpations: Abdomen is soft.  Musculoskeletal:        General: Normal range of motion.     Cervical back: Normal range of motion.  Skin:    General: Skin is warm.  Neurological:     General: No focal deficit present.     Mental Status: She is alert and oriented to person, place, and time.  Psychiatric:        Behavior: Behavior normal.        Judgment: Judgment normal.    LABORATORY DATA:  I have reviewed the data as listed Lab Results  Component Value Date   WBC 6.0 09/22/2023   HGB 11.2 (L) 09/22/2023   HCT 34.2 (L) 09/22/2023   MCV 102.1 (H) 09/22/2023   PLT 293 09/22/2023   Recent Labs    01/08/23 1051 01/20/23 1454 05/11/23 0900 05/24/23 1255 09/13/23 1505 09/22/23 0928  NA 140 138 139 132* 137 136  K 4.7 4.5 4.6 4.3 4.3 4.3  CL 104 106 105 101 103 105  CO2 27 21* 25 22 24 22   GLUCOSE 88 105* 86 93 91 96  BUN 22 31* 22 22 29* 30*  CREATININE 1.16 1.35* 1.13 1.32* 1.20 1.08*  CALCIUM  9.4 9.5 9.3 9.4 9.6 9.5  GFRNONAA  --  39*  --  40*  --  51*  PROT 6.7  --  7.0  --  7.0  --   ALBUMIN 4.4  --  4.3  --  4.6  --   AST 22  --  19  --  25  --   ALT 11  --  10  --  15  --   ALKPHOS 55  --  57  --  66  --   BILITOT 0.5  --  0.6  --  0.5  --   BILIDIR 0.1  --  0.1  --  0.1  --      No results found.  Symptomatic anemia #Anemia-iron  deficiency; - nadir July 2022- Hemoglobin improved from 7 to 12.  INTOLERANCE PO gentle iron / constipation [needing enema].  Proceed with iron  infusion today- stable.   # Macrocytosis- ? unclear etiology- B12 JAN 2024- > 1000; recommend qOD on b12 pills;  / CKD vs others.  Discussed re: bone marrow dysfunction, but HOLD off  bone marrow biopsy at his time. Etiology of iron  deficiency: [Oct 2022] s/p [facility]  evaluation; s/p  barium esophagogram.  #A. fib [Dr.Thomas; [facility] ]-on Eliquis 2.5 mg twice daily- s/p ablation [[facility]-Oct 2022]- stable  # CKD- Stage III- GFR 46- stable. [Dr.korrapati]  # DISPOSITION: # venofer  today;  # follow up in 3 month-  MD; labs- cbc/bmp/iron  studies/ferritin; LDH;B12 level- Possible venofer -Dr.B    All questions were answered. The patient knows to call the clinic with any problems, questions or concerns.    Gwyn Leos, MD 09/22/2023 10:43 AM

## 2023-09-23 ENCOUNTER — Ambulatory Visit
Admission: RE | Admit: 2023-09-23 | Discharge: 2023-09-23 | Disposition: A | Discharge status: Home / Self Care | Payer: Medicare Other | Source: Ambulatory Visit | Attending: Internal Medicine | Admitting: Internal Medicine

## 2023-09-23 DIAGNOSIS — R1031 Right lower quadrant pain: Secondary | ICD-10-CM | POA: Diagnosis not present

## 2023-09-23 DIAGNOSIS — N281 Cyst of kidney, acquired: Secondary | ICD-10-CM | POA: Diagnosis not present

## 2023-09-23 DIAGNOSIS — K4020 Bilateral inguinal hernia, without obstruction or gangrene, not specified as recurrent: Secondary | ICD-10-CM | POA: Diagnosis not present

## 2023-09-23 DIAGNOSIS — K5730 Diverticulosis of large intestine without perforation or abscess without bleeding: Secondary | ICD-10-CM | POA: Diagnosis not present

## 2023-09-27 ENCOUNTER — Telehealth: Payer: Self-pay

## 2023-09-27 DIAGNOSIS — K4020 Bilateral inguinal hernia, without obstruction or gangrene, not specified as recurrent: Secondary | ICD-10-CM

## 2023-09-27 NOTE — Telephone Encounter (Signed)
 Order placed for surgery referral.

## 2023-09-27 NOTE — Telephone Encounter (Signed)
See surgery referral that is pended. Dr Maia Plan

## 2023-09-28 ENCOUNTER — Ambulatory Visit: Payer: Medicare Other | Admitting: Dermatology

## 2023-10-05 ENCOUNTER — Ambulatory Visit: Payer: Medicare Other | Admitting: Dermatology

## 2023-10-05 DIAGNOSIS — K4020 Bilateral inguinal hernia, without obstruction or gangrene, not specified as recurrent: Secondary | ICD-10-CM | POA: Diagnosis not present

## 2023-10-06 ENCOUNTER — Ambulatory Visit: Payer: Medicare Other | Admitting: *Deleted

## 2023-10-06 ENCOUNTER — Ambulatory Visit: Payer: Self-pay | Admitting: General Surgery

## 2023-10-06 VITALS — Ht 64.0 in | Wt 148.0 lb

## 2023-10-06 DIAGNOSIS — Z0000 Encounter for general adult medical examination without abnormal findings: Secondary | ICD-10-CM

## 2023-10-06 NOTE — H&P (View-Only) (Signed)
 History of Present Illness Leah Huber is an 83 year old female who presents with bilateral inguinal hernia. She is accompanied by her daughter. She was referred by Dr. Dale [place] for evaluation of right lower abdominal pain.   She has been experiencing right lower abdominal pain for the past two to three months. The pain initially started on the right side and has since spread across the lower abdomen. She associates the pain with a 'little knot' she can feel, which is present all the time but more noticeable when standing or moving. The knot is less noticeable when she is lying down. The pain is intermittent, sometimes occurring throughout the day, and she occasionally takes Tylenol for relief.   A CT scan of abdomen and pelvis performed on September 23, 2023, revealed bilateral inguinal hernias, with the right side being larger than the left. The scan showed a loop of intestine in the right inguinal hernia, which is not compromised.  I personally evaluated the images   She has a history of multiple abdominal surgeries, including gallbladder removal, hysterectomy, and bladder surgery, all performed years ago. She also has a history of back surgeries, shoulder surgeries, knee replacements, and carpal tunnel surgeries. She had an ablation in 2022.   No recent fevers, nausea, or vomiting. She reports intermittent pain in the lower abdomen and a persistent knot in the groin area.      PAST MEDICAL HISTORY:  Past Medical History      Past Medical History:  Diagnosis Date   08/13/21     Arthritis     Chicken pox     Concussion     Depression     Glaucoma     Gout     Hemorrhoids     History of adenomatous polyp of colon 09/05/2018   Hyperlipidemia     Hypertension     Shingles            PAST SURGICAL HISTORY:   Past Surgical History       Past Surgical History:  Procedure Laterality Date   COLONOSCOPY   06/05/2002    Adenomatous Polyp   EGD   03/27/2005   COLONOSCOPY    06/12/2005    Adenomatous Polyp   COLONOSCOPY   04/04/2009    PH Adenomatous Polyps   EGD   04/29/2011    No repeat per RTE   COLONOSCOPY   01/19/2014    PH Adenomatous Polyps: [facility] 01/2019 Recall ltr mailed    COLONOSCOPY   05/22/2019    Adenomatous Polyp/No Repeat due to age/TKT   Back surgery       bladder surgery       CHOLECYSTECTOMY       Excisioin of volar ganglion cyst       HYSTERECTOMY       KNEE ARTHROSCOPY        Right,  with partial medial meniscectomy, trochlear and patellar chondroplasty, synovectomy anterior, medial, lateral compartments 01/06/07   Left carpal tunnel release       Left total knee replacement       Right carpal tunnel surgery       Right total knee replacement       Rotator cuff repair, bilaterally       Spinal cord stimulator implant       TONSILLECTOMY               MEDICATIONS:  Encounter Medications        Outpatient  Encounter Medications as of 10/05/2023  Medication Sig Dispense Refill   cyanocobalamin (VITAMIN B12) 1000 MCG tablet Take 1,000 mcg by mouth every other day       ELIQUIS 2.5 mg tablet TAKE 1 TABLET EVERY 12 HOURS 180 tablet 3   finasteride (PROSCAR) 5 mg tablet Take 5 mg by mouth once daily       latanoprost (XALATAN) 0.005 % ophthalmic solution Place 1 drop into both eyes nightly         lifitegrast (XIIDRA) 5 % ophthalmic solution Place 1 drop into both eyes 2 (two) times daily       losartan (COZAAR) 25 MG tablet         metoprolol succinate (TOPROL-XL) 25 MG XL tablet TAKE 1 TABLET DAILY 90 tablet 3   minoxidiL 2.5 MG tablet Take 2.5 mg by mouth once daily       pantoprazole (PROTONIX) 40 MG DR tablet Take 20 mg by mouth every other day       rosuvastatin (CRESTOR) 10 MG tablet Take 1 tablet by mouth at bedtime       tolterodine (DETROL LA) 4 MG LA capsule Take 4 mg by mouth once daily.       adjuvant AS01E, PF,vial 1 of 2 Susp Inject 120 mg subcutaneously every 14 (fourteen) days (Patient not taking: Reported on  10/05/2023)        No facility-administered encounter medications on file as of 10/05/2023.        ALLERGIES:   Multaq [dronedarone], Bactrim [sulfamethoxazole-trimethoprim], Ciprofloxacin, Clindamycin, Morphine, and Sulfa (sulfonamide antibiotics)   SOCIAL HISTORY:  Social History  Social History         Socioeconomic History   Marital status: Widowed  Tobacco Use   Smoking status: Former   Smokeless tobacco: Never  Vaping Use   Vaping status: Never Used  Substance and Sexual Activity   Alcohol use: Yes      Alcohol/week: 0.0 standard drinks of alcohol      Comment: socially   Drug use: No   Sexual activity: Defer    Social Drivers of Health        Financial Resource Strain: Low Risk  (10/05/2023)    Overall Financial Resource Strain (CARDIA)     Difficulty of Paying Living Expenses: Not hard at all  Food Insecurity: No Food Insecurity (10/05/2023)    Hunger Vital Sign     Worried About Running Out of Food in the Last Year: Never true     Ran Out of Food in the Last Year: Never true  Transportation Needs: No Transportation Needs (10/05/2023)    PRAPARE - Therapist, art (Medical): No     Lack of Transportation (Non-Medical): No        FAMILY HISTORY:  Family History        Family History  Problem Relation Name Age of Onset   Myocardial Infarction (Heart attack) Mother       High blood pressure (Hypertension) Mother       Stroke Mother       Myocardial Infarction (Heart attack) Father       Myocardial Infarction (Heart attack) Other sibling          GENERAL REVIEW OF SYSTEMS:    General ROS: negative for - chills, fatigue, fever, weight gain or weight loss Allergy and Immunology ROS: negative for - hives  Hematological and Lymphatic ROS: negative for - bleeding problems or bruising, negative for  palpable nodes Endocrine ROS: negative for - heat or cold intolerance, hair changes Respiratory ROS: negative for - cough, shortness of  breath or wheezing Cardiovascular ROS: no chest pain or palpitations GI ROS: negative for nausea, vomiting, diarrhea, constipation.  Positive for abdominal pain Musculoskeletal ROS: Positive for - joint swelling or muscle pain Neurological ROS: negative for - confusion, syncope Dermatological ROS: negative for pruritus and rash   PHYSICAL EXAM:     Vitals:    10/05/23 1029  BP: 114/68  Pulse: 89  .  Ht:162.6 cm (5\' 4" ) Wt:64.9 kg (143 lb) JXB:JYNW surface area is 1.71 meters squared. Body mass index is 24.55 kg/m.Marland Kitchen   GENERAL: Alert, active, oriented x3   HEENT: Pupils equal reactive to light. Extraocular movements are intact. Sclera clear. Palpebral conjunctiva normal red color.Pharynx clear.   NECK: Supple with no palpable mass and no adenopathy.   LUNGS: Sound clear with no rales rhonchi or wheezes.   HEART: Regular rhythm S1 and S2 without murmur.   ABDOMEN: Soft and depressible, nontender with no palpable mass, no hepatomegaly.  Bilateral inguinal hernia, reducible.   EXTREMITIES: Well-developed well-nourished symmetrical with no dependent edema.   NEUROLOGICAL: Awake alert oriented, facial expression symmetrical, moving all extremities.   Results RADIOLOGY Abdominal CT: Positive for bilateral inguinal hernia, larger on the right side with noncompromised loop of intestine in the right inguinal hernia (09/23/2023)      Assessment & Plan Bilateral Inguinal Hernia CT scan on September 23, 2023, confirmed bilateral inguinal hernia, with the right side larger and symptomatic, containing a noncompromised loop of intestine. Symptoms include a palpable knot and intermittent pain, worsened by standing and relieved by lying down. Due to increased risk of incarceration and strangulation, surgical repair is recommended. Discussed laparoscopic repair. Post-surgery pain management typically involves Tylenol and ibuprofen, with stronger medication as a precaution. Family support is  crucial for recovery. Schedule laparoscopic repair for early February. Send a letter to the cardiologist for preoperative clearance. Hold Eliquis for two days before surgery and resume the night or morning after. Prescribe stronger pain medication postoperatively, with instructions to use Tylenol and ibuprofen as needed. Provide postoperative instructions to avoid heavy lifting over 20 pounds and monitor for severe pain, fever, nausea, vomiting, and lightheadedness. Advise on expected postoperative swelling and bruising, especially in the pubic area.   General Health Maintenance Preoperative clearance from the cardiologist is necessary due to a history of cardiac ablation and Eliquis use. Send a letter to the cardiologist for clearance and ensure follow-up if there are concerns or if additional tests like an EKG are needed.   Follow-up Schedule a follow-up appointment post-surgery to assess recovery and address any complications.    Non-recurrent bilateral inguinal hernia without obstruction or gangrene [K40.20]           Patient and her daughter verbalized understanding, all questions were answered, and were agreeable with the plan outlined above.    Carolan Shiver, MD   Electronically signed by Carolan Shiver, MD

## 2023-10-06 NOTE — H&P (Signed)
History of Present Illness Leah Huber is an 83 year old female who presents with bilateral inguinal hernia. She is accompanied by her daughter. She was referred by Dr. Dale Budd [place] for evaluation of right lower abdominal pain.   She has been experiencing right lower abdominal pain for the past two to three months. The pain initially started on the right side and has since spread across the lower abdomen. She associates the pain with a 'little knot' she can feel, which is present all the time but more noticeable when standing or moving. The knot is less noticeable when she is lying down. The pain is intermittent, sometimes occurring throughout the day, and she occasionally takes Tylenol for relief.   A CT scan of abdomen and pelvis performed on September 23, 2023, revealed bilateral inguinal hernias, with the right side being larger than the left. The scan showed a loop of intestine in the right inguinal hernia, which is not compromised.  I personally evaluated the images   She has a history of multiple abdominal surgeries, including gallbladder removal, hysterectomy, and bladder surgery, all performed years ago. She also has a history of back surgeries, shoulder surgeries, knee replacements, and carpal tunnel surgeries. She had an ablation in 2022.   No recent fevers, nausea, or vomiting. She reports intermittent pain in the lower abdomen and a persistent knot in the groin area.      PAST MEDICAL HISTORY:  Past Medical History      Past Medical History:  Diagnosis Date   08/13/21     Arthritis     Chicken pox     Concussion     Depression     Glaucoma     Gout     Hemorrhoids     History of adenomatous polyp of colon 09/05/2018   Hyperlipidemia     Hypertension     Shingles            PAST SURGICAL HISTORY:   Past Surgical History       Past Surgical History:  Procedure Laterality Date   COLONOSCOPY   06/05/2002    Adenomatous Polyp   EGD   03/27/2005   COLONOSCOPY    06/12/2005    Adenomatous Polyp   COLONOSCOPY   04/04/2009    PH Adenomatous Polyps   EGD   04/29/2011    No repeat per RTE   COLONOSCOPY   01/19/2014    PH Adenomatous Polyps: [facility] 01/2019 Recall ltr mailed    COLONOSCOPY   05/22/2019    Adenomatous Polyp/No Repeat due to age/TKT   Back surgery       bladder surgery       CHOLECYSTECTOMY       Excisioin of volar ganglion cyst       HYSTERECTOMY       KNEE ARTHROSCOPY        Right,  with partial medial meniscectomy, trochlear and patellar chondroplasty, synovectomy anterior, medial, lateral compartments 01/06/07   Left carpal tunnel release       Left total knee replacement       Right carpal tunnel surgery       Right total knee replacement       Rotator cuff repair, bilaterally       Spinal cord stimulator implant       TONSILLECTOMY               MEDICATIONS:  Encounter Medications        Outpatient  Encounter Medications as of 10/05/2023  Medication Sig Dispense Refill   cyanocobalamin (VITAMIN B12) 1000 MCG tablet Take 1,000 mcg by mouth every other day       ELIQUIS 2.5 mg tablet TAKE 1 TABLET EVERY 12 HOURS 180 tablet 3   finasteride (PROSCAR) 5 mg tablet Take 5 mg by mouth once daily       latanoprost (XALATAN) 0.005 % ophthalmic solution Place 1 drop into both eyes nightly         lifitegrast (XIIDRA) 5 % ophthalmic solution Place 1 drop into both eyes 2 (two) times daily       losartan (COZAAR) 25 MG tablet         metoprolol succinate (TOPROL-XL) 25 MG XL tablet TAKE 1 TABLET DAILY 90 tablet 3   minoxidiL 2.5 MG tablet Take 2.5 mg by mouth once daily       pantoprazole (PROTONIX) 40 MG DR tablet Take 20 mg by mouth every other day       rosuvastatin (CRESTOR) 10 MG tablet Take 1 tablet by mouth at bedtime       tolterodine (DETROL LA) 4 MG LA capsule Take 4 mg by mouth once daily.       adjuvant AS01E, PF,vial 1 of 2 Susp Inject 120 mg subcutaneously every 14 (fourteen) days (Patient not taking: Reported on  10/05/2023)        No facility-administered encounter medications on file as of 10/05/2023.        ALLERGIES:   Multaq [dronedarone], Bactrim [sulfamethoxazole-trimethoprim], Ciprofloxacin, Clindamycin, Morphine, and Sulfa (sulfonamide antibiotics)   SOCIAL HISTORY:  Social History  Social History         Socioeconomic History   Marital status: Widowed  Tobacco Use   Smoking status: Former   Smokeless tobacco: Never  Vaping Use   Vaping status: Never Used  Substance and Sexual Activity   Alcohol use: Yes      Alcohol/week: 0.0 standard drinks of alcohol      Comment: socially   Drug use: No   Sexual activity: Defer    Social Drivers of Health        Financial Resource Strain: Low Risk  (10/05/2023)    Overall Financial Resource Strain (CARDIA)     Difficulty of Paying Living Expenses: Not hard at all  Food Insecurity: No Food Insecurity (10/05/2023)    Hunger Vital Sign     Worried About Running Out of Food in the Last Year: Never true     Ran Out of Food in the Last Year: Never true  Transportation Needs: No Transportation Needs (10/05/2023)    PRAPARE - Therapist, art (Medical): No     Lack of Transportation (Non-Medical): No        FAMILY HISTORY:  Family History        Family History  Problem Relation Name Age of Onset   Myocardial Infarction (Heart attack) Mother       High blood pressure (Hypertension) Mother       Stroke Mother       Myocardial Infarction (Heart attack) Father       Myocardial Infarction (Heart attack) Other sibling          GENERAL REVIEW OF SYSTEMS:    General ROS: negative for - chills, fatigue, fever, weight gain or weight loss Allergy and Immunology ROS: negative for - hives  Hematological and Lymphatic ROS: negative for - bleeding problems or bruising, negative for  palpable nodes Endocrine ROS: negative for - heat or cold intolerance, hair changes Respiratory ROS: negative for - cough, shortness of  breath or wheezing Cardiovascular ROS: no chest pain or palpitations GI ROS: negative for nausea, vomiting, diarrhea, constipation.  Positive for abdominal pain Musculoskeletal ROS: Positive for - joint swelling or muscle pain Neurological ROS: negative for - confusion, syncope Dermatological ROS: negative for pruritus and rash   PHYSICAL EXAM:     Vitals:    10/05/23 1029  BP: 114/68  Pulse: 89  .  Ht:162.6 cm (5\' 4" ) Wt:64.9 kg (143 lb) JXB:JYNW surface area is 1.71 meters squared. Body mass index is 24.55 kg/m.Marland Kitchen   GENERAL: Alert, active, oriented x3   HEENT: Pupils equal reactive to light. Extraocular movements are intact. Sclera clear. Palpebral conjunctiva normal red color.Pharynx clear.   NECK: Supple with no palpable mass and no adenopathy.   LUNGS: Sound clear with no rales rhonchi or wheezes.   HEART: Regular rhythm S1 and S2 without murmur.   ABDOMEN: Soft and depressible, nontender with no palpable mass, no hepatomegaly.  Bilateral inguinal hernia, reducible.   EXTREMITIES: Well-developed well-nourished symmetrical with no dependent edema.   NEUROLOGICAL: Awake alert oriented, facial expression symmetrical, moving all extremities.   Results RADIOLOGY Abdominal CT: Positive for bilateral inguinal hernia, larger on the right side with noncompromised loop of intestine in the right inguinal hernia (09/23/2023)      Assessment & Plan Bilateral Inguinal Hernia CT scan on September 23, 2023, confirmed bilateral inguinal hernia, with the right side larger and symptomatic, containing a noncompromised loop of intestine. Symptoms include a palpable knot and intermittent pain, worsened by standing and relieved by lying down. Due to increased risk of incarceration and strangulation, surgical repair is recommended. Discussed laparoscopic repair. Post-surgery pain management typically involves Tylenol and ibuprofen, with stronger medication as a precaution. Family support is  crucial for recovery. Schedule laparoscopic repair for early February. Send a letter to the cardiologist for preoperative clearance. Hold Eliquis for two days before surgery and resume the night or morning after. Prescribe stronger pain medication postoperatively, with instructions to use Tylenol and ibuprofen as needed. Provide postoperative instructions to avoid heavy lifting over 20 pounds and monitor for severe pain, fever, nausea, vomiting, and lightheadedness. Advise on expected postoperative swelling and bruising, especially in the pubic area.   General Health Maintenance Preoperative clearance from the cardiologist is necessary due to a history of cardiac ablation and Eliquis use. Send a letter to the cardiologist for clearance and ensure follow-up if there are concerns or if additional tests like an EKG are needed.   Follow-up Schedule a follow-up appointment post-surgery to assess recovery and address any complications.    Non-recurrent bilateral inguinal hernia without obstruction or gangrene [K40.20]           Patient and her daughter verbalized understanding, all questions were answered, and were agreeable with the plan outlined above.    Carolan Shiver, MD   Electronically signed by Carolan Shiver, MD

## 2023-10-06 NOTE — Patient Instructions (Signed)
Ms. Alvelo , Thank you for taking time to come for your Medicare Wellness Visit. I appreciate your ongoing commitment to your health goals. Please review the following plan we discussed and let me know if I can assist you in the future.   Referrals/Orders/Follow-Ups/Clinician Recommendations: Remember to get your flu and shingles vaccines  This is a list of the screening recommended for you and due dates:  Health Maintenance  Topic Date Due   Zoster (Shingles) Vaccine (1 of 2) Never done   COVID-19 Vaccine (3 - Pfizer risk series) 10/31/2019   Flu Shot  12/06/2023*   Colon Cancer Screening  05/21/2024   Mammogram  06/01/2024   Medicare Annual Wellness Visit  10/05/2024   Pneumonia Vaccine  Completed   DEXA scan (bone density measurement)  Completed   HPV Vaccine  Aged Out   DTaP/Tdap/Td vaccine  Discontinued  *Topic was postponed. The date shown is not the original due date.    Advanced directives: (Copy Requested) Please bring a copy of your health care power of attorney and living will to the office to be added to your chart at your convenience.  Next Medicare Annual Wellness Visit scheduled for next year: Yes 10/11/24 @ 10:10

## 2023-10-06 NOTE — Progress Notes (Signed)
Subjective:   Leah Huber is a 83 y.o. female who presents for Medicare Annual (Subsequent) preventive examination.  Visit Complete: Virtual I connected with  Leah Huber on 10/06/23 by a audio enabled telemedicine application and verified that I am speaking with the correct person using two identifiers. This patient declined Interactive audio and Acupuncturist. Therefore the visit was completed with audio only.   Patient Location: Home  Provider Location: Office/Clinic  I discussed the limitations of evaluation and management by telemedicine. The patient expressed understanding and agreed to proceed.  Vital Signs: Because this visit was a virtual/telehealth visit, some criteria may be missing or patient reported. Any vitals not documented were not able to be obtained and vitals that have been documented are patient reported.  Patient Medicare AWV questionnaire was completed by the patient on 10/02/23; I have confirmed that all information answered by patient is correct and no changes since this date.  Cardiac Risk Factors include: advanced age (>61men, >64 women);dyslipidemia;hypertension;Other (see comment), Risk factor comments: AFib     Objective:    Today's Vitals   10/06/23 1046  Weight: 148 lb (67.1 kg)  Height: 5\' 4"  (1.626 m)   Body mass index is 25.4 kg/m.     10/06/2023   10:57 AM 01/20/2023    2:53 PM 01/06/2023   10:17 AM 10/09/2022    1:40 PM 08/27/2022    2:41 PM 07/07/2022    1:00 PM 06/08/2022    2:21 PM  Advanced Directives  Does Patient Have a Medical Advance Directive? Yes No Yes No Yes No Yes  Type of Estate agent of [place];Living will    Healthcare Power of [place];Living will    Does patient want to make changes to medical advance directive?     No - Patient declined    Copy of Healthcare Power of Attorney in Chart? No - copy requested    Yes - validated most recent copy scanned in chart (See row information)     Would patient like information on creating a medical advance directive?  No - Patient declined  No - Patient declined  No - Patient declined     Current Medications (verified) Outpatient Encounter Medications as of 10/06/2023  Medication Sig   cholecalciferol (VITAMIN D3) 25 MCG (1000 UNIT) tablet Take 2,000 Units by mouth daily.   cyanocobalamin 1000 MCG tablet Take 1,000 mcg by mouth every other day.   ELIQUIS 2.5 MG TABS tablet Take 2.5 mg by mouth 2 (two) times daily.   finasteride (PROSCAR) 5 MG tablet TAKE 1 TABLET DAILY   fluticasone (FLONASE) 50 MCG/ACT nasal spray Place 1 spray into both nostrils daily as needed for allergies or rhinitis.   latanoprost (XALATAN) 0.005 % ophthalmic solution Place 1 drop into both eyes at bedtime.    Lifitegrast (XIIDRA) 5 % SOLN Place 1 drop into both eyes daily.   losartan (COZAAR) 25 MG tablet Take 25 mg by mouth at bedtime.   metoprolol succinate (TOPROL-XL) 25 MG 24 hr tablet Take 25 mg by mouth daily.   minoxidil (LONITEN) 2.5 MG tablet TAKE 1 TABLET DAILY   pantoprazole (PROTONIX) 20 MG tablet One tablet qod   rosuvastatin (CRESTOR) 10 MG tablet TAKE 1 TABLET DAILY   tolterodine (DETROL LA) 4 MG 24 hr capsule Take 1 capsule (4 mg total) by mouth daily.   Facility-Administered Encounter Medications as of 10/06/2023  Medication   Tralokinumab-ldrm SOSY 300 mg    Allergies (verified) Clindamycin/lincomycin,  Dronedarone, Bactrim [sulfamethoxazole-trimethoprim], Morphine, Morphine and codeine, and Sulfa antibiotics   History: Past Medical History:  Diagnosis Date   Abnormal liver function test 07/23/2019   Anemia    Aortic aneurysm (HCC)    "SMALL"   Arthritis    neck to tailbone   Bilateral carpal tunnel syndrome 12/08/2017   Bronchitis    Chronic cough    @ NIGHT/ SEASONAL ALLERGIES   Clostridium difficile infection 2013-2014   Colon polyp    Concussion    Coronary artery disease    Dr Gwen Pounds cardiologist   Depression     Diverticulosis    GERD (gastroesophageal reflux disease)    Glaucoma    Gout    Hemorrhoids    History of chicken pox    History of colonic polyps    History of shingles    Hypercholesterolemia    Hypertension    CONTROLLED ON MEDS   Hypovolemia 12/10   acute renal failure   IBS (irritable bowel syndrome)    Neuromuscular disorder (HCC)    occasionally tingling in hands/ NUMBNESS IN RIGHT FOOT s/p back surgery   Osteoarthritis    bilateral knees   Osteopenia    Pneumonia    HX OF   Sleep apnea    very mild   sleep study 8 yrs ago does not use CPAP   Urinary incontinence    Past Surgical History:  Procedure Laterality Date   ABDOMINAL HYSTERECTOMY  1979   ANTERIOR CERVICAL DECOMP/DISCECTOMY FUSION N/A 11/28/2014   Procedure: CERVICAL FIVE-SIX  ANTERIOR CERVICAL DECOMPRESSION/DISCECTOMY FUSION 1 LEVEL;  Surgeon: Tressie Stalker, MD;  Location: [facility];  Service: Neurosurgery;  Laterality: N/A;  C56 anterior cervical decompression with fusion interbody prosthesis plating and bonegraft   BACK SURGERY     X2/ L4,5 & S1/ Cervical C7?   BLADDER SURGERY  1981   SUSPENSION   BREAST REDUCTION SURGERY     CARDIOVERSION N/A 10/29/2020   Procedure: CARDIOVERSION;  Surgeon: Lamar Blinks, MD;  Location: [facility];  Service: Cardiovascular;  Laterality: N/A;   CARPAL TUNNEL RELEASE  2007   CATARACT EXTRACTION W/PHACO Right 12/30/2016   Procedure: CATARACT EXTRACTION PHACO AND INTRAOCULAR LENS PLACEMENT (IOC)  Right toric lens;  Surgeon: Lockie Mola, MD;  Location: [facility];  Service: Ophthalmology;  Laterality: Right;  sleep apnea, does not use CPAP   CATARACT EXTRACTION W/PHACO Left 01/20/2017   Procedure: CATARACT EXTRACTION PHACO AND INTRAOCULAR LENS PLACEMENT (IOC)  Left Toric;  Surgeon: Lockie Mola, MD;  Location: [facility];  Service: Ophthalmology;  Laterality: Left;  toric lens   CHOLECYSTECTOMY  1996   COLONOSCOPY     COLONOSCOPY WITH  PROPOFOL N/A 05/22/2019   Procedure: COLONOSCOPY WITH PROPOFOL;  Surgeon: Toledo, Boykin Nearing, MD;  Location: [facility];  Service: Gastroenterology;  Laterality: N/A;   FOOT SURGERY  2005   JOINT REPLACEMENT Bilateral    KNEE   LAMINECTOMY  1983 & 1985   REDUCTION MAMMAPLASTY Bilateral 2001   ROTATOR CUFF REPAIR  2000 & 2004   left and right   SPINAL CORD STIMULATOR IMPLANT  03/1997   SPINAL CORD STIMULATOR REMOVAL  2007   Tendon repair right shoulder     TONSILLECTOMY     TOTAL KNEE ARTHROPLASTY  10/2011 & 09/13   left and right   Family History  Problem Relation Age of Onset   Heart disease Mother    Stroke Mother    Hypertension Mother  Hyperlipidemia Father    Heart disease Father    Heart disease Brother        2 brothers   Alzheimer's disease Brother    Heart disease Sister        pacemaker   Breast cancer Daughter 3   Social History   Socioeconomic History   Marital status: Widowed    Spouse name: Not on file   Number of children: 2   Years of education: Not on file   Highest education level: 12th grade  Occupational History   Not on file  Tobacco Use   Smoking status: Former    Current packs/day: 0.00    Average packs/day: 1 pack/day for 20.0 years (20.0 ttl pk-yrs)    Types: Cigarettes    Start date: 09/07/1968    Quit date: 09/07/1988    Years since quitting: 35.1   Smokeless tobacco: Never  Vaping Use   Vaping status: Never Used  Substance and Sexual Activity   Alcohol use: Yes    Alcohol/week: 2.0 standard drinks of alcohol    Types: 2 Glasses of wine per week    Comment: occasional   Drug use: No   Sexual activity: Never  Other Topics Concern   Not on file  Social History Narrative   She is widowed, has two daughters. [place]; quit smoking- in 1990s; social alcohol.    Social Drivers of Corporate investment banker Strain: Low Risk  (10/06/2023)   Overall Financial Resource Strain (CARDIA)    Difficulty of Paying Living Expenses: Not hard  at all  Food Insecurity: No Food Insecurity (10/06/2023)   Hunger Vital Sign    Worried About Running Out of Food in the Last Year: Never true    Ran Out of Food in the Last Year: Never true  Transportation Needs: No Transportation Needs (10/06/2023)   PRAPARE - Administrator, Civil Service (Medical): No    Lack of Transportation (Non-Medical): No  Physical Activity: Insufficiently Active (10/06/2023)   Exercise Vital Sign    Days of Exercise per Week: 3 days    Minutes of Exercise per Session: 20 min  Stress: No Stress Concern Present (10/06/2023)   [employer] of Occupational Health - Occupational Stress Questionnaire    Feeling of Stress : Not at all  Social Connections: Moderately Integrated (10/06/2023)   Social Connection and Isolation Panel [NHANES]    Frequency of Communication with Friends and Family: More than three times a week    Frequency of Social Gatherings with Friends and Family: More than three times a week    Attends Religious Services: More than 4 times per year    Active Member of [employer] or Organizations: Yes    Attends Banker Meetings: More than 4 times per year    Marital Status: Widowed    Tobacco Counseling Counseling given: Not Answered   Clinical Intake:  Pre-visit preparation completed: Yes  Pain : No/denies pain     BMI - recorded: 25.4 Nutritional Status: BMI 25 -29 Overweight Nutritional Risks: None Diabetes: No  How often do you need to have someone help you when you read instructions, pamphlets, or other written materials from your doctor or pharmacy?: 1 - Never  Interpreter Needed?: No  Information entered by :: R. Yony Roulston LPN   Activities of Daily Living    10/02/2023    5:21 PM  In your present state of health, do you have any difficulty performing the following activities:  Hearing? 0  Vision? 0  Difficulty concentrating or making decisions? 0  Walking or climbing stairs? 0  Dressing or bathing? 0   Doing errands, shopping? 0  Preparing Food and eating ? N  Using the Toilet? N  In the past six months, have you accidently leaked urine? Y  Do you have problems with loss of bowel control? N  Managing your Medications? N  Managing your Finances? N  Housekeeping or managing your Housekeeping? N    Patient Care Team: Dale Shady Dale, MD as PCP - General (Internal Medicine) Stanton Kidney, MD as Consulting Physician (Gastroenterology) Earna Coder, MD as Consulting Physician (Internal Medicine)  Indicate any recent Medical Services you may have received from other than [facility] providers in the past year (date may be approximate).     Assessment:   This is a routine wellness examination for [facility].  Hearing/Vision screen Hearing Screening - Comments:: No issues Vision Screening - Comments:: glasses   Goals Addressed             This Visit's Progress    Patient Stated       Wants to continue to be active        Depression Screen    10/06/2023   10:52 AM 09/13/2023    2:22 PM 05/14/2023    1:06 PM 09/09/2022   12:16 PM 08/27/2022    2:40 PM 05/01/2022    2:33 PM 11/18/2021    4:01 PM  PHQ 2/9 Scores  PHQ - 2 Score 0 0 0 0 0 0 0  PHQ- 9 Score 2 2 2 3        Fall Risk    10/02/2023    5:21 PM 09/13/2023    2:22 PM 05/14/2023    1:06 PM 09/09/2022   12:15 PM 08/27/2022    2:42 PM  Fall Risk   Falls in the past year? 1 1 1 1 1   Number falls in past yr: 0 0 0 0   Injury with Fall? 0 1 1 0   Risk for fall due to : History of fall(s);Impaired balance/gait History of fall(s) History of fall(s) History of fall(s);Impaired balance/gait   Follow up Falls evaluation completed;Falls prevention discussed Falls evaluation completed Falls evaluation completed Falls evaluation completed Falls evaluation completed    MEDICARE RISK AT HOME: Medicare Risk at Home Any stairs in or around the home?: Yes If so, are there any without handrails?: (Patient-Rptd) No Home free of  loose throw rugs in walkways, pet beds, electrical cords, etc?: (Patient-Rptd) Yes Adequate lighting in your home to reduce risk of falls?: (Patient-Rptd) Yes Life alert?: (Patient-Rptd) Yes Use of a cane, walker or w/c?: (Patient-Rptd) Yes Grab bars in the bathroom?: (Patient-Rptd) No Shower chair or bench in shower?: (Patient-Rptd) No Elevated toilet seat or a handicapped toilet?: (Patient-Rptd) Yes      Cognitive Function:    09/18/2016   11:27 AM  MMSE - Mini Mental State Exam  Orientation to time 5  Orientation to Place 5  Registration 3  Attention/ Calculation 5  Recall 3  Language- name 2 objects 2  Language- repeat 1  Language- follow 3 step command 3  Language- read & follow direction 1  Write a sentence 1  Copy design 1  Total score 30        10/06/2023   10:57 AM 08/27/2022    2:44 PM 05/04/2019   11:51 AM 10/26/2017    4:50 PM  6CIT Screen  What  Year? 0 points 0 points 0 points 0 points  What month? 0 points 0 points 0 points 0 points  What time? 0 points 0 points 0 points 0 points  Count back from 20 0 points 0 points 0 points 0 points  Months in reverse 0 points 0 points 0 points 0 points  Repeat phrase 2 points 0 points 0 points 0 points  Total Score 2 points 0 points 0 points 0 points    Immunizations Immunization History  Administered Date(s) Administered   Fluad Quad(high Dose 65+) 05/30/2019, 06/04/2020, 08/11/2021   Influenza Split 06/29/2014   Influenza, High Dose Seasonal PF 07/27/2016, 06/27/2018   Influenza,inj,Quad PF,6+ Mos 06/18/2015   Influenza-Unspecified 06/18/2015, 07/27/2016, 05/22/2017   PFIZER(Purple Top)SARS-COV-2 Vaccination 09/12/2019, 10/03/2019   Pneumococcal Conjugate-13 07/30/2014   Pneumococcal Polysaccharide-23 03/18/2017   Tdap 01/06/2023    TDAP status: Up to date  Flu Vaccine status: Up to date  Pneumonia vaccine status: Up to date   Covid-19 vaccine status: Declined, Education has been provided regarding the  importance of this vaccine but patient still declined. Advised may receive this vaccine at local pharmacy or Health Dept.or vaccine clinic. Aware to provide a copy of the vaccination record if obtained from local pharmacy or Health Dept. Verbalized acceptance and understanding.  Qualifies for Shingles Vaccine? Yes   Zostavax completed No   Shingrix Completed?: No.    Education has been provided regarding the importance of this vaccine. Patient has been advised to call insurance company to determine out of pocket expense if they have not yet received this vaccine. Advised may also receive vaccine at local pharmacy or Health Dept. Verbalized acceptance and understanding.  Screening Tests Health Maintenance  Topic Date Due   Zoster Vaccines- Shingrix (1 of 2) Never done   COVID-19 Vaccine (3 - Pfizer risk series) 10/31/2019   Medicare Annual Wellness (AWV)  08/28/2023   INFLUENZA VACCINE  12/06/2023 (Originally 04/08/2023)   Colonoscopy  05/21/2024   MAMMOGRAM  06/01/2024   Pneumonia Vaccine 68+ Years old  Completed   DEXA SCAN  Completed   HPV VACCINES  Aged Out   DTaP/Tdap/Td  Discontinued    Health Maintenance  Health Maintenance Due  Topic Date Due   Zoster Vaccines- Shingrix (1 of 2) Never done   COVID-19 Vaccine (3 - Pfizer risk series) 10/31/2019   Medicare Annual Wellness (AWV)  08/28/2023    Colorectal cancer screening: No longer required.   Mammogram status: Completed 05/2023. Repeat every year  Bone Density status: Completed 2. Results reflect: Bone density results: OSTEOPENIA. Repeat every 2 years. Patient declines  Lung Cancer Screening: (Low Dose CT Chest recommended if Age 42-80 years, 20 pack-year currently smoking OR have quit w/in 15years.) does not qualify.    Additional Screening:  Hepatitis C Screening: does not qualify; Completed NA age  Vision Screening: Recommended annual ophthalmology exams for early detection of glaucoma and other disorders of the  eye. Is the patient up to date with their annual eye exam?  Yes  Who is the provider or what is the name of the office in which the patient attends annual eye exams? [facility] If pt is not established with a provider, would they like to be referred to a provider to establish care? No .   Dental Screening: Recommended annual dental exams for proper oral hygiene    Community Resource Referral / Chronic Care Management: CRR required this visit?  No   CCM required this visit?  No  Plan:     I have personally reviewed and noted the following in the patient's chart:   Medical and social history Use of alcohol, tobacco or illicit drugs  Current medications and supplements including opioid prescriptions. Patient is not currently taking opioid prescriptions. Functional ability and status Nutritional status Physical activity Advanced directives List of other physicians Hospitalizations, surgeries, and ER visits in previous 12 months Vitals Screenings to include cognitive, depression, and falls Referrals and appointments  In addition, I have reviewed and discussed with patient certain preventive protocols, quality metrics, and best practice recommendations. A written personalized care plan for preventive services as well as general preventive health recommendations were provided to patient.     Sydell Axon, LPN   4/78/2956   After Visit Summary: (MyChart) Due to this being a telephonic visit, the after visit summary with patients personalized plan was offered to patient via MyChart   Nurse Notes: See routing comments

## 2023-10-07 ENCOUNTER — Encounter
Admission: RE | Admit: 2023-10-07 | Discharge: 2023-10-07 | Disposition: A | Discharge status: Home / Self Care | Payer: Medicare Other | Source: Ambulatory Visit | Attending: General Surgery | Admitting: General Surgery

## 2023-10-07 ENCOUNTER — Other Ambulatory Visit: Payer: Self-pay

## 2023-10-07 VITALS — Ht 64.0 in | Wt 148.0 lb

## 2023-10-07 DIAGNOSIS — I712 Thoracic aortic aneurysm, without rupture, unspecified: Secondary | ICD-10-CM

## 2023-10-07 DIAGNOSIS — R002 Palpitations: Secondary | ICD-10-CM

## 2023-10-07 DIAGNOSIS — I4891 Unspecified atrial fibrillation: Secondary | ICD-10-CM

## 2023-10-07 DIAGNOSIS — E7800 Pure hypercholesterolemia, unspecified: Secondary | ICD-10-CM

## 2023-10-07 DIAGNOSIS — I10 Essential (primary) hypertension: Secondary | ICD-10-CM

## 2023-10-07 HISTORY — DX: Unspecified atrial fibrillation: I48.91

## 2023-10-07 NOTE — Patient Instructions (Addendum)
Your procedure is scheduled on: Monday 03/09/24 To find out your arrival time, please call [phone] between 1PM - 3PM on:   Friday 10/08/23 Report to the Registration Desk on the 1st floor of the [facility]. FREE Valet parking is available.  If your arrival time is 6:00 am, do not arrive before that time as the [facility] entrance doors do not open until 6:00 am.  REMEMBER: Instructions that are not followed completely may result in serious medical risk, up to and including death; or upon the discretion of your surgeon and anesthesiologist your surgery may need to be rescheduled.  Do not eat food or drink any liquids after midnight the night before surgery.  No gum chewing or hard candies.  One week prior to surgery: Stop Anti-inflammatories (NSAIDS) such as Advil, Aleve, Ibuprofen, Motrin, Naproxen, Naprosyn and Aspirin based products such as Excedrin, Goody's Powder, BC Powder. You may however, continue to take Tylenol if needed for pain up until the day of surgery.  Stop ANY OVER THE COUNTER supplements and vitamins until after surgery.  Continue taking all prescribed medications with the exception of the following: Eliquis last dose will be Friday night 10/08/23  TAKE ONLY THESE MEDICATIONS THE MORNING OF SURGERY WITH A SIP OF WATER:  finasteride (PROSCAR) 5 MG tablet  metoprolol succinate (TOPROL-XL) 25 MG 24 hr tablet  pantoprazole (PROTONIX) 20 MG tablet  rosuvastatin (CRESTOR) 10 MG tablet   No Alcohol for 24 hours before or after surgery.  No Smoking including e-cigarettes for 24 hours before surgery.  No chewable tobacco products for at least 6 hours before surgery.  No nicotine patches on the day of surgery.  Do not use any "recreational" drugs for at least a week (preferably 2 weeks) before your surgery.  Please be advised that the combination of cocaine and anesthesia may have negative outcomes, up to and including death. If you test positive for cocaine, your  surgery will be cancelled.  On the morning of surgery brush your teeth with toothpaste and water, you may rinse your mouth with mouthwash if you wish. Do not swallow any toothpaste or mouthwash.  Use CHG Soap or wipes as directed on instruction sheet.  Do not wear lotions, powders, or perfumes.   Do not shave body hair from the neck down 48 hours before surgery.  Wear clean comfortable clothing (specific to your surgery type) to the hospital.  Do not wear jewelry, make-up, hairpins, clips or nail polish.  For welded (permanent) jewelry: bracelets, anklets, waist bands, etc.  Please have this removed prior to surgery.  If it is not removed, there is a chance that hospital personnel will need to cut it off on the day of surgery. Contact lenses, hearing aids and dentures may not be worn into surgery.  Do not bring valuables to the hospital. [facility] is not responsible for any missing/lost belongings or valuables.   Notify your doctor if there is any change in your medical condition (cold, fever, infection).  If you are being discharged the day of surgery, you will not be allowed to drive home. You will need a responsible individual to drive you home and stay with you for 24 hours after surgery.   If you are taking public transportation, you will need to have a responsible individual with you.  If you are being admitted to the hospital overnight, leave your suitcase in the car. After surgery it may be brought to your room.  In case of increased  patient census, it may be necessary for you, the patient, to continue your postoperative care in the Same Day Surgery department.  After surgery, you can help prevent lung complications by doing breathing exercises.  Take deep breaths and cough every 1-2 hours. Your doctor may order a device called an Incentive Spirometer to help you take deep breaths. When coughing or sneezing, hold a pillow firmly against your incision with both hands. This  is called "splinting." Doing this helps protect your incision. It also decreases belly discomfort.  Surgery Visitation Policy:  Patients undergoing a surgery or procedure may have two family members or support persons with them as long as the person is not COVID-19 positive or experiencing its symptoms.   Inpatient Visitation:    Visiting hours are 7 a.m. to 8 p.m. Up to four visitors are allowed at one time in a patient room. The visitors may rotate out with other people during the day. One designated support person (adult) may remain overnight.  Due to an increase in RSV and influenza rates and associated hospitalizations, children ages 78 and under will not be able to visit patients in [facility]. Masks continue to be strongly recommended.  Please call the Pre-admissions Testing Dept. at [phone] if you have any questions about these instructions.     Preparing for Surgery with CHLORHEXIDINE GLUCONATE (CHG) Soap  Chlorhexidine Gluconate (CHG) Soap (can be picked up at our office at [address] [facility])  o An antiseptic cleaner that kills germs and bonds with the skin to continue killing germs even after washing  o Used for showering the night before surgery and morning of surgery  Before surgery, you can play an important role by reducing the number of germs on your skin.  CHG (Chlorhexidine gluconate) soap is an antiseptic cleanser which kills germs and bonds with the skin to continue killing germs even after washing.  Please do not use if you have an allergy to CHG or antibacterial soaps. If your skin becomes reddened/irritated stop using the CHG.  1. Shower the NIGHT BEFORE SURGERY and the MORNING OF SURGERY with CHG soap.  2. If you choose to wash your hair, wash your hair first as usual with your normal shampoo.  3. After shampooing, rinse your hair and body thoroughly to remove the shampoo.  4. Use CHG as you would  any other liquid soap. You can apply CHG directly to the skin and wash gently with a scrungie or a clean washcloth.  5. Apply the CHG soap to your body only from the neck down. Do not use on open wounds or open sores. Avoid contact with your eyes, ears, mouth, and genitals (private parts). Wash face and genitals (private parts) with your normal soap.  6. Wash thoroughly, paying special attention to the area where your surgery will be performed.  7. Thoroughly rinse your body with warm water.  8. Do not shower/wash with your normal soap after using and rinsing off the CHG soap.  9. Pat yourself dry with a clean towel.  10. Wear clean pajamas to bed the night before surgery.  12. Place clean sheets on your bed the night of your first shower and do not sleep with pets.  13. Shower again with the CHG soap on the day of surgery prior to arriving at the hospital.  14. Do not apply any deodorants/lotions/powders.  15. Please wear clean clothes to the hospital.

## 2023-10-08 ENCOUNTER — Encounter
Admission: RE | Admit: 2023-10-08 | Discharge: 2023-10-08 | Disposition: A | Discharge status: Home / Self Care | Payer: Medicare Other | Source: Ambulatory Visit | Attending: General Surgery

## 2023-10-08 ENCOUNTER — Encounter: Payer: Self-pay | Admitting: General Surgery

## 2023-10-08 ENCOUNTER — Inpatient Hospital Stay: Admission: RE | Admit: 2023-10-08 | Payer: Medicare Other | Source: Ambulatory Visit

## 2023-10-08 DIAGNOSIS — E7800 Pure hypercholesterolemia, unspecified: Secondary | ICD-10-CM | POA: Diagnosis not present

## 2023-10-08 DIAGNOSIS — Z01818 Encounter for other preprocedural examination: Secondary | ICD-10-CM | POA: Diagnosis present

## 2023-10-08 DIAGNOSIS — Z01810 Encounter for preprocedural cardiovascular examination: Secondary | ICD-10-CM | POA: Diagnosis not present

## 2023-10-08 DIAGNOSIS — I4891 Unspecified atrial fibrillation: Secondary | ICD-10-CM | POA: Diagnosis not present

## 2023-10-08 DIAGNOSIS — R002 Palpitations: Secondary | ICD-10-CM | POA: Diagnosis not present

## 2023-10-08 DIAGNOSIS — I712 Thoracic aortic aneurysm, without rupture, unspecified: Secondary | ICD-10-CM | POA: Diagnosis not present

## 2023-10-08 DIAGNOSIS — I10 Essential (primary) hypertension: Secondary | ICD-10-CM | POA: Diagnosis not present

## 2023-10-08 NOTE — Progress Notes (Signed)
Perioperative / Anesthesia Services  Pre-Admission Testing Clinical Review / Pre-Operative Anesthesia Consult  Date: 10/08/23  Patient Demographics:  Name: Leah Huber DOB: [birth year] MRN:   [id number]  Planned Surgical Procedure(s):    Case: [id number] Date/Time: 10/11/23 0944   Procedure: XI ROBOTIC ASSISTED BILATERAL INGUINAL HERNIA (Bilateral: Inguinal)   Anesthesia type: General   Pre-op diagnosis: K40.20 non recurrent bil inguinal hernia w/o obstruction or gangrene   Location: [facility] OR ROOM 04 / [facility]   Surgeons: Carolan Shiver, MD      NOTE: Available PAT nursing documentation and vital signs have been reviewed. Clinical nursing staff has updated patient's PMH/PSHx, current medication list, and drug allergies/intolerances to ensure comprehensive history available to assist in medical decision making as it pertains to the aforementioned surgical procedure and anticipated anesthetic course. Extensive review of available clinical information personally performed. [place] PMH and PSHx updated with any diagnoses/procedures that  may have been inadvertently omitted during his intake with the pre-admission testing department's nursing staff.  Clinical Discussion:  Leah Huber is a 83 y.o. female who is submitted for pre-surgical anesthesia review and clearance prior to her undergoing the above procedure. Patient is a Former Smoker (20 pack years; quit 09/1988). Pertinent PMH includes: CAD, atrial fibrillation, diastolic dysfunction, ascending aorta dilatation, PFO, palpitations, aortic atherosclerosis, BILATERAL carotid artery disease, HTN, HLD, CKD-III, hyperparathyroidism of renal origin, shortness of breath, pulmonary hypertension, chronic cough, GERD (on daily PPI), IDA, BILATERAL inguinal hernias, OA, BILATERAL hands and RIGHT foot paresthesias, depression.  Patient is followed by cardiology Lorelle Gibbs, MD). She was last seen in the cardiology  clinic on 01/21/2023; notes reviewed. At the time of her clinic visit, patient doing well overall from a cardiovascular perspective.  Patient with occasional episodes of self-limiting palpitations with no other associated symptoms. Patient denied any chest pain, shortness of breath, PND, orthopnea, significant peripheral edema, weakness, fatigue, vertiginous symptoms, or presyncope/syncope. Patient with a past medical history significant for cardiovascular diagnoses. Documented physical exam was grossly benign, providing no evidence of acute exacerbation and/or decompensation of the patient's known cardiovascular conditions.  Myocardial perfusion imaging study was performed on 08/19/2020 revealing a normal left regular systolic function with an EF of 64%.  There was no evidence of stress-induced myocardial ischemia or arrhythmia; no scintigraphic evidence of scar.  Patient did have baseline ECG changes related to her known atrial fibrillation with relatively rapid ventricular rate at peak stress.  Overall, study was determined to be indeterminant risk.  Cardiac MRI was performed on 05/20/2021 revealing normal pulmonary veins entering the left atrium.  All veins were noted to be patent without significant stenosis.  Thoracic aorta normal in diameter with no evidence of dissection.  Proximal pulmonary arteries normal in size.  Systemic venous connections normal.  Esophagus posterior to the LEFT atrium in close proximity to the ostia of the LEFT sided pulmonary veins.  Patient with a history of atrial fibrillation.  She came in for DCCV procedure on 10/29/2020, however procedure was aborted as patient arrived and found to be maintaining sinus rhythm.  Patient with refractory atrial arrhythmia leading to the decision to pursue more definitive treatment.  Patient underwent WACA PVI on 05/21/2021.  Most recent TTE was performed on 02/04/2023 revealing a normal left ventricular systolic function with an EF of >55%.   There were no regional wall motion abnormalities.  GLS -14.7% there was mild LEFT atrial enlargement.  PFO was observed with (+) left-to-right shunting.  Of note, patient  with a history of transseptal puncture.  There was trivial aortic and pulmonary, in addition to mild mitral and moderate tricuspid valve regurgitation.  RVSP elevated at 54.8 mmHg consistent with patient's known pulmonary hypertension diagnosis.  All transvalvular gradients were noted to be normal providing no evidence suggestive of valvular stenosis.  Patient with known dilatation of the thoracic ascending aorta; aortic root 2.8 cm and ascending aorta 3.9 cm.  Patient with an atrial fibrillation diagnosis; CHA2DS2-VASc Score = 5 (age x 2, sex, HTN, vascular disease). Her rate and rhythm are currently being maintained on oral metoprolol succinate. She is chronically anticoagulated using dose reduced apixaban; reported to be compliant with therapy with no evidence or reports of GI/GU bleeding.  Blood pressure reasonably controlled at 130/70 mmHg on currently prescribed ARB (losartan) and beta-blocker (metoprolol succinate) therapies. She is on rosuvastatin for her HLD diagnosis and further ASCVD prevention.  Patient is not diabetic.  She does not have an OSAH diagnosis. Patient is able to complete all of her  ADL/IADLs without cardiovascular limitation.  Per the DASI, patient is able to achieve at least 4 METS of physical activity without experiencing any significant degree of angina/anginal equivalent symptoms.  No changes were made to her medication regimen.  Patient follow-up with outpatient cardiology in 6 months or sooner if needed.  Leah Huber is scheduled for an elective XI ROBOTIC ASSISTED BILATERAL INGUINAL HERNIA (Bilateral: Inguinal) on 10/11/2023 with Dr. Sung Amabile, DO. Given patient's past medical history significant for cardiovascular diagnoses, presurgical cardiac clearance was sought by the PAT team. Per cardiology, "this  patient is optimized for surgery and may proceed with the planned procedural course with a MODERATE risk of significant perioperative cardiovascular complications".   Again, this patient is on daily oral anticoagulation therapy using a DOAC medication.  She has been instructed on recommendations for holding her apixaban for 2 days prior to her procedure with plans to restart as soon as postoperative bleeding risk felt to be minimized by his attending surgeon. The patient has been instructed that her last dose of apixaban should be on 10/08/2023.  Patient denies previous perioperative complications with anesthesia in the past. In review her EMR, it is noted that patient underwent a general anesthetic course at [facility] in 05/2021 without documented complications.      10/07/2023   10:49 AM 10/06/2023   10:46 AM 09/22/2023   11:03 AM  Vitals with BMI  Height 5\' 4"  5\' 4"    Weight 148 lbs 148 lbs   BMI 25.39 25.39   Systolic  -- 115  Diastolic  -- 52  Pulse   70   Providers/Specialists:  NOTE: Primary physician provider listed below. Patient may have been seen by APP or partner within same practice.   PROVIDER ROLE / SPECIALTY LAST Beverely Pace, MD General Surgery (Surgeon) 10/06/2023  Dale New Deal, MD Primary Care Provider 05/14/2023  Tiajuana Amass, MD Cardiology 01/21/2023  Louretta Shorten, MD Hematology 09/22/2023  Lorain Childes, MD Nephrology 03/03/2023   Allergies:   Allergies  Allergen Reactions   Clindamycin/Lincomycin Other (See Comments)    C. Diff   Dronedarone Other (See Comments)   Bactrim [Sulfamethoxazole-Trimethoprim] Other (See Comments)    Abdominal cramps   Morphine Other (See Comments)    Went a little "crazy"   Morphine And Codeine Other (See Comments)    HALLUCINATIONS   Sulfa Antibiotics Other (See Comments)    Abdominal pain   Current Home Medications:  Tralokinumab-ldrm SOSY 300 mg     cholecalciferol (VITAMIN D3) 25 MCG (1000 UNIT) tablet   cyanocobalamin 1000 MCG tablet   ELIQUIS 2.5 MG TABS tablet   finasteride (PROSCAR) 5 MG tablet   fluticasone (FLONASE) 50 MCG/ACT nasal spray   latanoprost (XALATAN) 0.005 % ophthalmic solution   Lifitegrast (XIIDRA) 5 % SOLN   losartan (COZAAR) 25 MG tablet   metoprolol succinate (TOPROL-XL) 25 MG 24 hr tablet   minoxidil (LONITEN) 2.5 MG tablet   pantoprazole (PROTONIX) 20 MG tablet   rosuvastatin (CRESTOR) 10 MG tablet   tolterodine (DETROL LA) 4 MG 24 hr capsule   History:   Past Medical History:  Diagnosis Date   A-fib (HCC)    a.) CHA2DS2-VASc = 5 (age x2, sex, HTN, vascular disease) as of 10/08/2023; b.) scheduled for DCCV 10/29/220 - arrived in SR - procedure cancelled; c.) s/p WACA/PVI ablation 05/21/2021; d.) cardiac rate/rhythm maintained on oral metoprolol succinate; chronically anticoagulated using apixaban   Adenomatous colon polyp    Aortic atherosclerosis (HCC)    Arthritis    Ascending aorta dilation (HCC)    a.) TTE 03/06/2016: asc Ao 40 mm, Ao root 32 mm; b.) TTE 10/13/2018: Ao root 32 mm; c.) TTE 02/04/2023: Ao root 28 mm, asc Ao 39 mm   Bilateral carotid artery disease (HCC)    Bilateral carpal tunnel syndrome 12/08/2017   Bilateral inguinal hernia    Cervical spondylosis with radiculopathy    a.) s/p ACDF C5-C6 2016   Chronic cough    @ NIGHT/ SEASONAL ALLERGIES   CKD (chronic kidney disease), stage III (HCC)    Clostridium difficile infection 2013-2014   Concussion    Coronary artery calcification seen on CT scan    Depression    Diastolic dysfunction 03/06/2016   a.) TTE 03/06/16: EF >55%, G1DD, RVSP 46.5, mild MR, mod TR, asc Ao 40 mm, Ao root 32 mm; b.) TTE 10/13/18: EF >55%, mild LVH, RVSP 35.1, mild MR/TR/PR, Ao root 32 mm; c.) TTE 08/19/20: EF >55%, mild RVE, mild BAE, G1DD, RVSP 42.4, triv AR/PR, mild MR, mod TR; d.) TTE 02/04/2023: EF >55%, mild LAE, RVSP 54.8, PFO (prior transeptal  puncture) with L-R IAS, mild MR, mod TR, Ao root 28 mm, asc Ao 39 mm   Diverticulosis    GERD (gastroesophageal reflux disease)    Glaucoma    Gout    Hair loss    a.) on minoxidil   Hemorrhoids    History of bilateral cataract extraction    History of chicken pox    History of colonic polyps    History of shingles    Hypercholesterolemia    Hypertension    IBS (irritable bowel syndrome)    IDA (iron deficiency anemia)    On reduced dose apixaban therapy    Osteoarthritis    Osteopenia    Palpitations    Paresthesia of both hands    PFO (patent foramen ovale)    a.) TTE 02/04/2023: PFO (prior transeptal puncture) with L-R IAS   Pneumonia    Pulmonary HTN (HCC) 03/06/2016   a.) TTE 03/06/2016: RVSP 46.5; b.) TTE 10/13/2018: RVSP 35.1; c.) TTE 08/19/2020: RVSP 42.4; d.) TTE 02/04/2023: RVSP 54.8   Right leg paresthesias (lower leg and foot)    a.) following spinal surgery   Secondary hyperparathyroidism of renal origin (HCC)    Sleep apnea    a.) mild; does not require nocturnal PAP therapy   SOB (shortness of breath)  Urinary incontinence    Past Surgical History:  Procedure Laterality Date   ABDOMINAL HYSTERECTOMY  1979   ANTERIOR CERVICAL DECOMP/DISCECTOMY FUSION N/A 11/28/2014   Procedure: CERVICAL FIVE-SIX  ANTERIOR CERVICAL DECOMPRESSION/DISCECTOMY FUSION 1 LEVEL;  Surgeon: Tressie Stalker, MD;  Location: [facility];  Service: Neurosurgery;  Laterality: N/A;  C56 anterior cervical decompression with fusion interbody prosthesis plating and bonegraft   BACK SURGERY     X2/ L4,5 & S1/ Cervical C7?   BLADDER SURGERY  1981   SUSPENSION   BREAST REDUCTION SURGERY     CARDIOVERSION N/A 10/29/2020   Procedure: CARDIOVERSION;  Surgeon: Lamar Blinks, MD;  Location: [facility] ORS;  Service: Cardiovascular;  Laterality: N/A; CANCELLED AS PATIENT WAS IN SINUS RHYTHM   CARPAL TUNNEL RELEASE  2007   CATARACT EXTRACTION W/PHACO Right 12/30/2016   Procedure: CATARACT EXTRACTION  PHACO AND INTRAOCULAR LENS PLACEMENT (IOC)  Right toric lens;  Surgeon: Lockie Mola, MD;  Location: [facility];  Service: Ophthalmology;  Laterality: Right   CATARACT EXTRACTION W/PHACO Left 01/20/2017   Procedure: CATARACT EXTRACTION PHACO AND INTRAOCULAR LENS PLACEMENT (IOC)  Left Toric;  Surgeon: Lockie Mola, MD;  Location: [facility];  Service: Ophthalmology;  Laterality: Left;  toric lens   CHOLECYSTECTOMY  1996   COLONOSCOPY     COLONOSCOPY WITH PROPOFOL N/A 05/22/2019   Procedure: COLONOSCOPY WITH PROPOFOL;  Surgeon: Toledo, Boykin Nearing, MD;  Location: [facility] END[facility];  Service: Gastroenterology;  Laterality: N/A;   FOOT SURGERY  2005   LAMINECTOMY  1983 & 1985   REDUCTION MAMMAPLASTY Bilateral 2001   ROTATOR CUFF REPAIR  2000 & 2004   left and right   SPINAL CORD STIMULATOR IMPLANT  03/1997   SPINAL CORD STIMULATOR REMOVAL  2007   Tendon repair right shoulder     TONSILLECTOMY     TOTAL KNEE ARTHROPLASTY  10/2011 & 09/13   left and right   Family History  Problem Relation Age of Onset   Heart disease Mother    Stroke Mother    Hypertension Mother    Hyperlipidemia Father    Heart disease Father    Heart disease Brother        2 brothers   Alzheimer's disease Brother    Heart disease Sister        pacemaker   Breast cancer Daughter 59   Social History   Tobacco Use   Smoking status: Former    Current packs/day: 0.00    Average packs/day: 1 pack/day for 20.0 years (20.0 ttl pk-yrs)    Types: Cigarettes    Start date: 09/07/1968    Quit date: 09/07/1988    Years since quitting: 35.1   Smokeless tobacco: Never  Substance Use Topics   Alcohol use: Yes    Alcohol/week: 2.0 standard drinks of alcohol    Types: 2 Glasses of wine per week    Comment: occasional   Pertinent Clinical Results:  LABS:  Lab Results  Component Value Date   WBC 6.0 09/22/2023   HGB 11.2 (L) 09/22/2023   HCT 34.2 (L) 09/22/2023   MCV 102.1 (H) 09/22/2023    PLT 293 09/22/2023   Lab Results  Component Value Date   NA 136 09/22/2023   CL 105 09/22/2023   K 4.3 09/22/2023   CO2 22 09/22/2023   BUN 30 (H) 09/22/2023   CREATININE 1.08 (H) 09/22/2023   GFRNONAA 51 (L) 09/22/2023   CALCIUM 9.5 09/22/2023   ALBUMIN 4.6 09/13/2023  GLUCOSE 96 09/22/2023    ECG: Date: 10/08/2023  Time ECG obtained: 1302 PM Rate: 86 bpm Rhythm: normal sinus Axis (leads I and aVF): normal Intervals: PR 156 ms. QRS 66 ms. QTc 445 ms. ST segment and T wave changes: No evidence of acute T wave abnormalities or significant ST segment elevation or depression.  Comparison: Similar to previous tracing obtained on 01/06/2023   IMAGING / PROCEDURES: CT ABDOMEN PELVIS WO CONTRAST performed on 09/23/2023 No acute abdominopelvic findings.  Normal appendix. Colonic diverticulosis without evidence of acute diverticulitis. Bilateral inguinal hernias. A non-compromised loop of small bowel protrudes into patient's right inguinal hernia. No evidence of bowel obstruction Aortic atherosclerosis  TRANSTHORACIC ECHOCARDIOGRAM performed on 02/04/2023 Normal left ventricular systolic function with an EF of >55% Diastolic Doppler parameters indeterminant Mild left atrial enlargement PFO with left-to-right shunting (history of transseptal puncture) Normal right ventricular systolic function Trivial AR, PR Mild MR Moderate TR RVSP = 54.8 mmHg Normal transvalvular gradients; no valvular stenosis No pericardial effusion  MYOCARDIAL PERFUSION IMAGING STUDY (LEXISCAN) performed on 08/19/2020 Indeterminate treadmill EKG due to baseline EKG changes of atrial fibrillation with relatively rapid ventricular rate at peak stress  Normal myocardial perfusion without evidence of myocardial ischemia   Impression and Plan:  Leah Huber has been referred for pre-anesthesia review and clearance prior to her undergoing the planned anesthetic and procedural courses. Available labs,  pertinent testing, and imaging results were personally reviewed by me in preparation for upcoming operative/procedural course. [facility] medical Huber has been updated following extensive Huber review and patient interview with PAT staff.   This patient has been appropriately cleared by cardiology with an overall MODERATE risk of experiencing significant perioperative cardiovascular complications. Based on clinical review performed today (10/08/23), barring any significant acute changes in the patient's overall condition, it is anticipated that she will be able to proceed with the planned surgical intervention. Any acute changes in clinical condition may necessitate her procedure being postponed and/or cancelled. Patient will meet with anesthesia team (MD and/or CRNA) on the day of her procedure for preoperative evaluation/assessment. Questions regarding anesthetic course will be fielded at that time.   Pre-surgical instructions were reviewed with the patient during his PAT appointment, and questions were fielded to satisfaction by PAT clinical staff. She has been instructed on which medications that she will need to hold prior to surgery, as well as the ones that have been deemed safe/appropriate to take on the day of his procedure. As part of the general education provided by PAT, patient made aware both verbally and in writing, that she would need to abstain from the use of any illegal substances during his perioperative course. She was advised that failure to follow the provided instructions could necessitate case cancellation or result in serious perioperative complications up to and including death. Patient encouraged to contact PAT and/or her surgeon's office to discuss any questions or concerns that may arise prior to surgery; verbalized understanding.   Quentin Mulling, MSN, APRN, FNP-C, CEN [facility]  Perioperative Services Nurse Practitioner Phone: [phone] Fax: [phone] 10/08/23 1:16 PM  NOTE: This note has been prepared using Dragon dictation software. Despite my best ability to proofread, there is always the potential that unintentional transcriptional errors may still occur from this process.

## 2023-10-10 MED ORDER — CHLORHEXIDINE GLUCONATE 0.12 % MT SOLN
15.0000 mL | Freq: Once | OROMUCOSAL | Status: AC
  Administered 2023-10-11: 15 mL via OROMUCOSAL

## 2023-10-10 MED ORDER — ORAL CARE MOUTH RINSE: 15.0000 mL | Freq: Once | OROMUCOSAL | Status: AC

## 2023-10-10 MED ORDER — CEFAZOLIN SODIUM-DEXTROSE 2-4 GM/100ML-% IV SOLN
2.0000 g | INTRAVENOUS | Status: AC
  Administered 2023-10-11: 2 g via INTRAVENOUS

## 2023-10-10 MED ORDER — LACTATED RINGERS IV SOLN: INTRAVENOUS | Status: DC

## 2023-10-11 ENCOUNTER — Ambulatory Visit: Payer: Self-pay | Admitting: Urgent Care

## 2023-10-11 ENCOUNTER — Other Ambulatory Visit: Payer: Self-pay

## 2023-10-11 ENCOUNTER — Encounter: Payer: Self-pay | Admitting: General Surgery

## 2023-10-11 ENCOUNTER — Encounter
Admission: RE | Disposition: A | Discharge status: Home / Self Care | Payer: Self-pay | Source: Home / Self Care | Attending: General Surgery

## 2023-10-11 ENCOUNTER — Ambulatory Visit
Admission: RE | Admit: 2023-10-11 | Discharge: 2023-10-11 | Disposition: A | Discharge status: Home / Self Care | Payer: Medicare Other | Attending: General Surgery | Admitting: General Surgery

## 2023-10-11 DIAGNOSIS — I081 Rheumatic disorders of both mitral and tricuspid valves: Secondary | ICD-10-CM | POA: Insufficient documentation

## 2023-10-11 DIAGNOSIS — F32A Depression, unspecified: Secondary | ICD-10-CM | POA: Diagnosis not present

## 2023-10-11 DIAGNOSIS — M109 Gout, unspecified: Secondary | ICD-10-CM | POA: Diagnosis not present

## 2023-10-11 DIAGNOSIS — D631 Anemia in chronic kidney disease: Secondary | ICD-10-CM | POA: Insufficient documentation

## 2023-10-11 DIAGNOSIS — G4730 Sleep apnea, unspecified: Secondary | ICD-10-CM | POA: Insufficient documentation

## 2023-10-11 DIAGNOSIS — I4891 Unspecified atrial fibrillation: Secondary | ICD-10-CM | POA: Insufficient documentation

## 2023-10-11 DIAGNOSIS — Z79899 Other long term (current) drug therapy: Secondary | ICD-10-CM | POA: Insufficient documentation

## 2023-10-11 DIAGNOSIS — K219 Gastro-esophageal reflux disease without esophagitis: Secondary | ICD-10-CM | POA: Insufficient documentation

## 2023-10-11 DIAGNOSIS — Z87891 Personal history of nicotine dependence: Secondary | ICD-10-CM | POA: Diagnosis not present

## 2023-10-11 DIAGNOSIS — M1990 Unspecified osteoarthritis, unspecified site: Secondary | ICD-10-CM | POA: Insufficient documentation

## 2023-10-11 DIAGNOSIS — K4020 Bilateral inguinal hernia, without obstruction or gangrene, not specified as recurrent: Secondary | ICD-10-CM | POA: Diagnosis present

## 2023-10-11 DIAGNOSIS — Z7901 Long term (current) use of anticoagulants: Secondary | ICD-10-CM | POA: Diagnosis not present

## 2023-10-11 DIAGNOSIS — N183 Chronic kidney disease, stage 3 unspecified: Secondary | ICD-10-CM | POA: Insufficient documentation

## 2023-10-11 DIAGNOSIS — I129 Hypertensive chronic kidney disease with stage 1 through stage 4 chronic kidney disease, or unspecified chronic kidney disease: Secondary | ICD-10-CM | POA: Diagnosis not present

## 2023-10-11 HISTORY — DX: Bilateral inguinal hernia, without obstruction or gangrene, not specified as recurrent: K40.20

## 2023-10-11 HISTORY — DX: Benign neoplasm of colon, unspecified: D12.6

## 2023-10-11 HISTORY — DX: Atherosclerosis of aorta: I70.0

## 2023-10-11 HISTORY — DX: Iron deficiency anemia, unspecified: D50.9

## 2023-10-11 HISTORY — DX: Other spondylosis with radiculopathy, cervical region: M47.22

## 2023-10-11 HISTORY — DX: Paresthesia of skin: R20.2

## 2023-10-11 HISTORY — DX: Chronic kidney disease, stage 3 unspecified: N18.30

## 2023-10-11 HISTORY — DX: Patent foramen ovale: Q21.12

## 2023-10-11 HISTORY — DX: Disorder of arteries and arterioles, unspecified: I77.9

## 2023-10-11 HISTORY — DX: Shortness of breath: R06.02

## 2023-10-11 HISTORY — DX: Cataract extraction status, left eye: Z98.41

## 2023-10-11 HISTORY — DX: Palpitations: R00.2

## 2023-10-11 HISTORY — DX: Secondary hyperparathyroidism of renal origin: N25.81

## 2023-10-11 HISTORY — DX: Thoracic aortic ectasia: I77.810

## 2023-10-11 HISTORY — DX: Atherosclerotic heart disease of native coronary artery without angina pectoris: I25.10

## 2023-10-11 HISTORY — DX: Long term (current) use of anticoagulants: Z79.01

## 2023-10-11 HISTORY — DX: Nonscarring hair loss, unspecified: L65.9

## 2023-10-11 SURGERY — REPAIR, HERNIA, INGUINAL, BILATERAL, ROBOT-ASSISTED
Anesthesia: General | Site: Inguinal | Laterality: Bilateral

## 2023-10-11 MED ORDER — PROPOFOL 10 MG/ML IV BOLUS
INTRAVENOUS | Status: AC
  Filled 2023-10-11: qty 20

## 2023-10-11 MED ORDER — DEXAMETHASONE SODIUM PHOSPHATE 10 MG/ML IJ SOLN
INTRAMUSCULAR | Status: DC | PRN
  Administered 2023-10-11: 10 mg via INTRAVENOUS

## 2023-10-11 MED ORDER — ONDANSETRON HCL 4 MG PO TABS: 4.0000 mg | ORAL_TABLET | Freq: Three times a day (TID) | ORAL | 0 refills | Status: DC | PRN

## 2023-10-11 MED ORDER — ACETAMINOPHEN 10 MG/ML IV SOLN
INTRAVENOUS | Status: AC
  Filled 2023-10-11: qty 100

## 2023-10-11 MED ORDER — PROPOFOL 10 MG/ML IV BOLUS
INTRAVENOUS | Status: DC | PRN
  Administered 2023-10-11: 100 mg via INTRAVENOUS

## 2023-10-11 MED ORDER — IBUPROFEN 800 MG PO TABS: 800.0000 mg | ORAL_TABLET | Freq: Three times a day (TID) | ORAL | 0 refills | Status: DC | PRN

## 2023-10-11 MED ORDER — OXYCODONE HCL 5 MG/5ML PO SOLN: 5.0000 mg | Freq: Once | ORAL | Status: AC | PRN

## 2023-10-11 MED ORDER — OXYCODONE HCL 5 MG PO TABS
5.0000 mg | ORAL_TABLET | Freq: Once | ORAL | Status: AC | PRN
  Administered 2023-10-11: 5 mg via ORAL

## 2023-10-11 MED ORDER — LIDOCAINE HCL (CARDIAC) PF 100 MG/5ML IV SOSY
PREFILLED_SYRINGE | INTRAVENOUS | Status: DC | PRN
  Administered 2023-10-11: 50 mg via INTRAVENOUS

## 2023-10-11 MED ORDER — FENTANYL CITRATE (PF) 100 MCG/2ML IJ SOLN
INTRAMUSCULAR | Status: DC | PRN
  Administered 2023-10-11: 25 ug via INTRAVENOUS
  Administered 2023-10-11: 50 ug via INTRAVENOUS
  Administered 2023-10-11: 25 ug via INTRAVENOUS

## 2023-10-11 MED ORDER — LACTATED RINGERS IV SOLN: INTRAVENOUS | Status: AC

## 2023-10-11 MED ORDER — HYDROCODONE-ACETAMINOPHEN 5-325 MG PO TABS: 1.0000 | ORAL_TABLET | ORAL | 0 refills | Status: AC | PRN

## 2023-10-11 MED ORDER — BUPIVACAINE-EPINEPHRINE 0.25% -1:200000 IJ SOLN
INTRAMUSCULAR | Status: DC | PRN
  Administered 2023-10-11: 20 mL
  Administered 2023-10-11: 10 mL

## 2023-10-11 MED ORDER — BUPIVACAINE-EPINEPHRINE (PF) 0.25% -1:200000 IJ SOLN
INTRAMUSCULAR | Status: AC
  Filled 2023-10-11: qty 30

## 2023-10-11 MED ORDER — ONDANSETRON HCL 4 MG/2ML IJ SOLN
INTRAMUSCULAR | Status: DC | PRN
  Administered 2023-10-11: 4 mg via INTRAVENOUS

## 2023-10-11 MED ORDER — ONDANSETRON HCL 4 MG/2ML IJ SOLN: 4.0000 mg | Freq: Once | INTRAMUSCULAR | Status: DC | PRN

## 2023-10-11 MED ORDER — SUGAMMADEX SODIUM 200 MG/2ML IV SOLN
INTRAVENOUS | Status: DC | PRN
  Administered 2023-10-11: 200 mg via INTRAVENOUS

## 2023-10-11 MED ORDER — ACETAMINOPHEN 10 MG/ML IV SOLN
INTRAVENOUS | Status: DC | PRN
  Administered 2023-10-11: 1000 mg via INTRAVENOUS

## 2023-10-11 MED ORDER — LIDOCAINE HCL (PF) 2 % IJ SOLN
INTRAMUSCULAR | Status: AC
  Filled 2023-10-11: qty 10

## 2023-10-11 MED ORDER — FENTANYL CITRATE (PF) 100 MCG/2ML IJ SOLN
INTRAMUSCULAR | Status: AC
  Filled 2023-10-11: qty 2

## 2023-10-11 MED ORDER — ONDANSETRON HCL 4 MG/2ML IJ SOLN
INTRAMUSCULAR | Status: AC
  Filled 2023-10-11: qty 4

## 2023-10-11 MED ORDER — ROCURONIUM BROMIDE 100 MG/10ML IV SOLN
INTRAVENOUS | Status: DC | PRN
  Administered 2023-10-11: 20 mg via INTRAVENOUS
  Administered 2023-10-11: 10 mg via INTRAVENOUS
  Administered 2023-10-11: 30 mg via INTRAVENOUS

## 2023-10-11 MED ORDER — CEFAZOLIN SODIUM-DEXTROSE 2-4 GM/100ML-% IV SOLN
INTRAVENOUS | Status: AC
  Filled 2023-10-11: qty 100

## 2023-10-11 MED ORDER — 0.9 % SODIUM CHLORIDE (POUR BTL) OPTIME
TOPICAL | Status: DC | PRN
  Administered 2023-10-11: 500 mL

## 2023-10-11 MED ORDER — PHENYLEPHRINE 80 MCG/ML (10ML) SYRINGE FOR IV PUSH (FOR BLOOD PRESSURE SUPPORT)
PREFILLED_SYRINGE | INTRAVENOUS | Status: AC
  Filled 2023-10-11: qty 20

## 2023-10-11 MED ORDER — DEXAMETHASONE SODIUM PHOSPHATE 10 MG/ML IJ SOLN
INTRAMUSCULAR | Status: AC
  Filled 2023-10-11: qty 2

## 2023-10-11 MED ORDER — ROCURONIUM BROMIDE 10 MG/ML (PF) SYRINGE
PREFILLED_SYRINGE | INTRAVENOUS | Status: AC
  Filled 2023-10-11: qty 20

## 2023-10-11 MED ORDER — OXYCODONE HCL 5 MG PO TABS
ORAL_TABLET | ORAL | Status: AC
  Filled 2023-10-11: qty 1

## 2023-10-11 MED ORDER — CHLORHEXIDINE GLUCONATE 0.12 % MT SOLN
OROMUCOSAL | Status: AC
  Filled 2023-10-11: qty 15

## 2023-10-11 MED ORDER — ACETAMINOPHEN 10 MG/ML IV SOLN: 1000.0000 mg | Freq: Once | INTRAVENOUS | Status: DC | PRN

## 2023-10-11 MED ORDER — PHENYLEPHRINE 80 MCG/ML (10ML) SYRINGE FOR IV PUSH (FOR BLOOD PRESSURE SUPPORT)
PREFILLED_SYRINGE | INTRAVENOUS | Status: DC | PRN
  Administered 2023-10-11: 80 ug via INTRAVENOUS

## 2023-10-11 MED ORDER — FENTANYL CITRATE (PF) 100 MCG/2ML IJ SOLN
25.0000 ug | INTRAMUSCULAR | Status: DC | PRN
  Administered 2023-10-11 (×4): 25 ug via INTRAVENOUS

## 2023-10-11 SURGICAL SUPPLY — 43 items
BAG PRESSURE INF REUSE 1000 (BAG) IMPLANT
COVER TIP SHEARS 8 DVNC (MISCELLANEOUS) ×1 IMPLANT
COVER WAND RF STERILE (DRAPES) ×1 IMPLANT
DERMABOND ADVANCED .7 DNX12 (GAUZE/BANDAGES/DRESSINGS) ×1 IMPLANT
DRAPE ARM DVNC X/XI (DISPOSABLE) ×3 IMPLANT
DRAPE COLUMN DVNC XI (DISPOSABLE) ×1 IMPLANT
ELECT REM PT RETURN 9FT ADLT (ELECTROSURGICAL) ×1
ELECTRODE REM PT RTRN 9FT ADLT (ELECTROSURGICAL) ×1 IMPLANT
FORCEPS BPLR R/ABLATION 8 DVNC (INSTRUMENTS) ×1 IMPLANT
GLOVE BIO SURGEON STRL SZ 6.5 (GLOVE) ×2 IMPLANT
GLOVE BIOGEL PI IND STRL 6.5 (GLOVE) ×2 IMPLANT
GOWN STRL REUS W/ TWL LRG LVL3 (GOWN DISPOSABLE) ×3 IMPLANT
IRRIGATOR SUCT 8 DISP DVNC XI (IRRIGATION / IRRIGATOR) IMPLANT
IV CATH ANGIO 12GX3 LT BLUE (NEEDLE) IMPLANT
IV NS 1000ML BAXH (IV SOLUTION) IMPLANT
KIT PINK PAD W/HEAD ARE REST (MISCELLANEOUS) ×1
KIT PINK PAD W/HEAD ARM REST (MISCELLANEOUS) ×1 IMPLANT
LABEL OR SOLS (LABEL) IMPLANT
MANIFOLD NEPTUNE II (INSTRUMENTS) ×1 IMPLANT
MESH 3DMAX MID 5X7 LT XLRG (Mesh General) IMPLANT
MESH 3DMAX MID 5X7 RT XLRG (Mesh General) IMPLANT
NDL DRIVE SUT CUT DVNC (INSTRUMENTS) ×1 IMPLANT
NDL HYPO 22X1.5 SAFETY MO (MISCELLANEOUS) ×1 IMPLANT
NDL INSUFFLATION 14GA 120MM (NEEDLE) ×1 IMPLANT
NEEDLE DRIVE SUT CUT DVNC (INSTRUMENTS) ×1
NEEDLE HYPO 22X1.5 SAFETY MO (MISCELLANEOUS) ×1
NEEDLE INSUFFLATION 14GA 120MM (NEEDLE) ×1
OBTURATOR OPTICAL STND 8 DVNC (TROCAR) ×1
OBTURATOR OPTICALSTD 8 DVNC (TROCAR) ×1 IMPLANT
PACK LAP CHOLECYSTECTOMY (MISCELLANEOUS) ×1 IMPLANT
SCISSORS MNPLR CVD DVNC XI (INSTRUMENTS) ×1 IMPLANT
SEAL UNIV 5-12 XI (MISCELLANEOUS) ×3 IMPLANT
SET TUBE SMOKE EVAC HIGH FLOW (TUBING) ×1 IMPLANT
SOL ELECTROSURG ANTI STICK (MISCELLANEOUS) ×1
SOLUTION ELECTROSURG ANTI STCK (MISCELLANEOUS) ×1 IMPLANT
SUT MNCRL 4-0 27 PS-2 XMFL (SUTURE) ×1
SUT STRATA 2-0 23CM CT-2 (SUTURE) ×1 IMPLANT
SUT VIC AB 2-0 SH 27XBRD (SUTURE) ×1 IMPLANT
SUTURE MNCRL 4-0 27XMF (SUTURE) ×1 IMPLANT
TAPE TRANSPORE STRL 2 31045 (GAUZE/BANDAGES/DRESSINGS) IMPLANT
TRAP FLUID SMOKE EVACUATOR (MISCELLANEOUS) ×1 IMPLANT
TRAY FOLEY MTR SLVR 16FR STAT (SET/KITS/TRAYS/PACK) ×1 IMPLANT
WATER STERILE IRR 500ML POUR (IV SOLUTION) ×1 IMPLANT

## 2023-10-11 NOTE — Interval H&P Note (Signed)
History and Physical Interval Note:  10/11/2023 9:27 AM  Leah Huber  has presented today for surgery, with the diagnosis of K40.20 non recurrent bil inguinal hernia w/o obstruction or gangrene.  The various methods of treatment have been discussed with the patient and family. After consideration of risks, benefits and other options for treatment, the patient has consented to  Procedure(s): XI ROBOTIC ASSISTED BILATERAL INGUINAL HERNIA (Bilateral) as a surgical intervention.  The patient's history has been reviewed, patient examined, no change in status, stable for surgery.  I have reviewed the patient's chart and labs.  Questions were answered to the patient's satisfaction.     Carolan Shiver

## 2023-10-11 NOTE — Anesthesia Postprocedure Evaluation (Signed)
Anesthesia Post Note  Patient: Leah Huber  Procedure(s) Performed: XI ROBOTIC ASSISTED BILATERAL INGUINAL HERNIA (Bilateral: Inguinal)  Patient location during evaluation: PACU Anesthesia Type: General Level of consciousness: awake and alert Pain management: pain level controlled Vital Signs Assessment: post-procedure vital signs reviewed and stable Respiratory status: spontaneous breathing, nonlabored ventilation, respiratory function stable and patient connected to nasal cannula oxygen Cardiovascular status: blood pressure returned to baseline and stable Postop Assessment: no apparent nausea or vomiting Anesthetic complications: no   No notable events documented.   Last Vitals:  Vitals:   10/11/23 1225 10/11/23 1230  BP:  (!) 141/68  Pulse: 81 81  Resp: (!) 22 14  Temp:  36.5 C  SpO2: 98% 97%    Last Pain:  Vitals:   10/11/23 1230  TempSrc:   PainSc: 3                  Cleda Mccreedy Chyrel Taha

## 2023-10-11 NOTE — Discharge Instructions (Signed)
   Diet: Resume home heart healthy regular diet.   Activity: No heavy lifting >20 pounds (children, pets, laundry, garbage) or strenuous activity until follow-up, but light activity and walking are encouraged. Do not drive or drink alcohol if taking narcotic pain medications.  Wound care: May shower with soapy water and pat dry (do not rub incisions), but no baths or submerging incision underwater until follow-up. (no swimming)   Medications: Resume all home medications. For mild to moderate pain: acetaminophen (Tylenol) or ibuprofen (if no kidney disease). Combining Tylenol with alcohol can substantially increase your risk of causing liver disease. Narcotic pain medications, if prescribed, can be used for severe pain, though may cause nausea, constipation, and drowsiness. Do not combine Tylenol and Norco within a 6 hour period as Norco contains Tylenol. If you do not need the narcotic pain medication, you do not need to fill the prescription.  Call office [phone]) at any time if any questions, worsening pain, fevers/chills, bleeding, drainage from incision site, or other concerns.

## 2023-10-11 NOTE — Anesthesia Procedure Notes (Signed)
Procedure Name: Intubation Date/Time: 10/11/2023 9:59 AM  Performed by: Joanette Gula, Kieley Akter, CRNAPre-anesthesia Checklist: Patient identified, Emergency Drugs available, Suction available and Patient being monitored Patient Re-evaluated:Patient Re-evaluated prior to induction Oxygen Delivery Method: Circle system utilized Preoxygenation: Pre-oxygenation with 100% oxygen Induction Type: IV induction Ventilation: Mask ventilation without difficulty Laryngoscope Size: McGrath and 3 Grade View: Grade I Tube type: Oral Tube size: 6.5 mm Number of attempts: 1 Airway Equipment and Method: Stylet Placement Confirmation: ETT inserted through vocal cords under direct vision, positive ETCO2 and breath sounds checked- equal and bilateral Secured at: 19 cm Tube secured with: Tape Dental Injury: Teeth and Oropharynx as per pre-operative assessment

## 2023-10-11 NOTE — Anesthesia Preprocedure Evaluation (Addendum)
Anesthesia Evaluation  Patient identified by MRN, date of birth, ID band Patient awake    Reviewed: Allergy & Precautions, NPO status , Patient's Chart, lab work & pertinent test results  History of Anesthesia Complications Negative for: history of anesthetic complications  Airway Mallampati: III   Neck ROM: Full    Dental   Bridges, crowns:   Pulmonary sleep apnea , former smoker (quit 1990)   Pulmonary exam normal breath sounds clear to auscultation       Cardiovascular hypertension, pulmonary hypertensionNormal cardiovascular exam+ dysrhythmias (a fib on Eliquis s/p ablation 2022)  Rhythm:Regular Rate:Normal  ECG 10/08/23: normal  Echo 02/04/23:  NORMAL LEFT VENTRICULAR SYSTOLIC FUNCTION WITH AN ESTIMATED EF = >55 %  NORMAL RIGHT VENTRICULAR SYSTOLIC FUNCTION  MODERATE TRICUSPID VALVE INSUFFICIENCY  MILD MITRAL VALVE INSUFFICIENCY  PFO with L to R shunting (prior transseptal puncture)  Mild to moderate PH  GLS: -14.7 %   Myocardial perfusion 08/19/20:  1. Indeterminate treadmill EKG due to baseline EKG changes of atrial fibrillation with relatively rapid ventricular rate at peak stress  2. Normal myocardial perfusion without evidence of myocardial ischemia     Neuro/Psych  PSYCHIATRIC DISORDERS  Depression    negative neurological ROS     GI/Hepatic ,GERD  ,,  Endo/Other  negative endocrine ROS    Renal/GU Renal disease (stage III CKD)     Musculoskeletal  (+) Arthritis ,  Gout    Abdominal   Peds  Hematology  (+) Blood dyscrasia, anemia   Anesthesia Other Findings Reviewed and agree with Edd Fabian pre-anesthesia clinical review note.    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 We talked about this today, and pt and her daughter are wary of increasing dose. They would like to think about it. Will continue to address at future visits.  -continue Crestor -obtain echo -f/u six mos    Reproductive/Obstetrics                             Anesthesia Physical Anesthesia Plan  ASA: 3  Anesthesia Plan: General   Post-op Pain Management:    Induction: Intravenous  PONV Risk Score and Plan: 3 and Ondansetron, Dexamethasone and Treatment may vary due to age or medical condition  Airway Management Planned: Oral ETT  Additional Equipment:   Intra-op Plan:   Post-operative Plan: Extubation in OR  Informed Consent: I have reviewed the patients History and Physical, chart, labs and discussed the procedure including the risks, benefits and alternatives for the proposed anesthesia with the patient or authorized representative who has indicated his/her understanding and acceptance.     Dental advisory given  Plan Discussed with: CRNA  Anesthesia Plan Comments: (Patient consented for risks of anesthesia including but not limited to:  - adverse reactions to medications - damage to eyes, teeth, lips or other oral mucosa - nerve damage due to positioning  - sore throat or hoarseness - damage to heart, brain, nerves, lungs, other parts of body or loss of life  Informed patient about role of CRNA  in peri- and intra-operative care.  Patient voiced understanding.)        Anesthesia Quick Evaluation

## 2023-10-11 NOTE — Transfer of Care (Signed)
Immediate Anesthesia Transfer of Care Note  Patient: Leah Huber  Procedure(s) Performed: XI ROBOTIC ASSISTED BILATERAL INGUINAL HERNIA (Bilateral: Inguinal)  Patient Location: PACU  Anesthesia Type:General  Level of Consciousness: awake, alert , and oriented  Airway & Oxygen Therapy: Patient Spontanous Breathing and Patient connected to face mask oxygen  Post-op Assessment: Report given to RN and Post -op Vital signs reviewed and stable  Post vital signs: Reviewed and stable  Last Vitals:  Vitals Value Taken Time  BP 168/61 10/11/23 1140  Temp    Pulse 75 10/11/23 1142  Resp 17 10/11/23 1142  SpO2 100 % 10/11/23 1142  Vitals shown include unfiled device data.  Last Pain:  Vitals:   10/11/23 0739  TempSrc: Temporal      Patients Stated Pain Goal: 0 (10/11/23 0739)  Complications: No notable events documented.

## 2023-10-11 NOTE — Op Note (Signed)
Preoperative diagnosis: Bilateral inguinal hernia.   Postoperative diagnosis: Bilateral inguinal hernia.  Procedure: Robotic assisted Laparoscopic Transabdominal preperitoneal laparoscopic (TAPP) repair of Bilateral inguinal hernia.  Anesthesia: GETA  Surgeon: Dr. Hazle Quant  Wound Classification: Clean  Indications:  Patient is a 83 y.o. female developed a symptomatic bilateral inguinal hernia. Repair was indicated.  Findings: 1. Bilateral indirect Inguinal hernia identified 2. Vas deferens and cord structures identified and preserved 3. Bard Extra Large 3D Max MID Anatomical mesh used for repair 4. Adequate hemostasis.   Description of procedure:  The patient was taken to the operating room and the correct side of surgery was verified. The patient was placed supine with right arm tucked at the side. After obtaining adequate anesthesia, the patient's abdomen was prepped and draped in standard sterile fashion. A time-out was completed verifying correct patient, procedure, site, positioning, and implant(s) and/or special equipment prior to beginning this procedure.  An incision was made in a natural skin line above the umbilicus. The fascia was elevated and the Veress needle inserted. Proper position was confirmed by aspiration and saline meniscus test.  The abdomen was insufflated with carbon dioxide to a pressure of 15 mmHg. The patient tolerated insufflation well.  Abdominal cavity was entered using Optiview technique with a millimeter trocar.  No injury was identified.  Another 2 mm trocars were placed lateral to each rectus muscle.  Scissors and bipolar forceps were inserted under direct visualization. Both hernias repaired using the following technique: At the robotic console: Transverse peritoneal incision is made about 8 cm superior to the inguinal defect. Medial to the epigastric vessels, the parietal compartment is dissected to visualize the rectus muscle. This is carried down to  the symphysis pubis and the retropubic space is dissected to expose at least 2 cm contralateral to the midline. Cooper's ligament is exposed and cleared at least 2 cm below the ligament to ensure adequate space for the inferior border of the mesh. Hesselbach's triangle is cleared assessing for a direct hernia. Lateral to the epigastric vessels, the dissection is carried out in visceral compartment continuing in the true preperitoneal plane. Indirect hernia sac, was carefully reduced and separated from the cord structures with medial retraction and a combination of blunt/sharp dissection and focused cautery. This dissection was continued until the cord structures are "parietalized" completely, allowing for visualization of the reflected peritoneum that is continuous with the line originating 2 cm below Coopers medially and across the psoas muscle in the lateral compartment.  The internal ring was interrogated for a cord lipoma. The cord lipoma was reduced to the retroperitoneum and seated dorsal to the preperitoneal mesh. Having achieved a complete dissection with a critical view of the entire myopectineal orifice on both sides, a Right and Left XL mesh was then positioned centered at the iliopubic tract with the medial side crossing the midline and the inferior edge positioned 2 cm below Coopers ligament. The lateral aspect of the mesh extended 3-5 cm beyond the lateral edge of the psoas. The mesh is fixated using an interrupted suture placed to the ipsilateral Coopers ligament. A second suture was done at the medial superior aspect of the mesh fixating this to the rectus complex.  The peritoneal flap is closed with running barbed suture. Additional holes in the peritoneum closed with suture. Preperitoneal space gas aspirated to visualize the peritoneum apposed directly against the mesh and ensure no folding, lifting, or buckling of the mesh. Skin is closed, sterile dressings are applied.  The patient tolerated  the procedure well and was taken to the postanesthesia care unit in stable condition  Specimen: None  Complications: None  Estimated Blood Loss: 5 mL

## 2023-11-02 ENCOUNTER — Ambulatory Visit: Payer: Medicare Other | Admitting: Internal Medicine

## 2023-11-04 ENCOUNTER — Ambulatory Visit: Payer: Medicare Other | Admitting: Internal Medicine

## 2023-11-30 ENCOUNTER — Other Ambulatory Visit: Payer: Self-pay | Admitting: Internal Medicine

## 2023-12-01 ENCOUNTER — Other Ambulatory Visit: Payer: Self-pay | Admitting: Internal Medicine

## 2023-12-13 ENCOUNTER — Encounter: Payer: Self-pay | Admitting: Internal Medicine

## 2023-12-13 ENCOUNTER — Ambulatory Visit (INDEPENDENT_AMBULATORY_CARE_PROVIDER_SITE_OTHER): Payer: Medicare Other | Admitting: Internal Medicine

## 2023-12-13 DIAGNOSIS — E611 Iron deficiency: Secondary | ICD-10-CM

## 2023-12-13 DIAGNOSIS — E7800 Pure hypercholesterolemia, unspecified: Secondary | ICD-10-CM

## 2023-12-13 DIAGNOSIS — I10 Essential (primary) hypertension: Secondary | ICD-10-CM

## 2023-12-13 NOTE — Assessment & Plan Note (Signed)
 Seeing Dr Donneta Romberg - last 05/24/23 - recommended to proceed with iron infusion. Continues on oral B12 every other day. Had f/u with GI 04/01/23. Recommended continuing protonix. Had f/u with nephrology 03/03/23 - stable. Saw cardiology 01/21/23 - recommended continuing on toprol, eliquis. Recommended echo. S/p echo 01/2023 - moderate TR, mild MR, PFO with Lto R shunting, mild to moderate PH with EF >55%. From cardiac standpoint, she feels things are stable. Breathing stable. Is s/p bilateral inguinal hernia repair.

## 2023-12-13 NOTE — Progress Notes (Signed)
 Patient ID: Leah Huber, female   DOB: 05/04/1941, 83 y.o.   MRN: [id number] Rescheduled appt.

## 2023-12-13 NOTE — Progress Notes (Deleted)
 Subjective:    Patient ID: Leah Huber, female    DOB: [birth year], 83 y.o.   MRN: [id number]  Patient here for No chief complaint on file.   HPI Here for a scheduled follow up. Seeing Dr Donneta Romberg - last 05/24/23 - recommended to proceed with iron infusion. Continues on oral B12 every other day. Had f/u with GI 04/01/23. Recommended continuing protonix. Had f/u with nephrology 03/03/23 - stable. Saw cardiology 01/21/23 - recommended continuing on toprol, eliquis. Recommended echo. S/p echo 01/2023 - moderate TR, mild MR, PFO with Lto R shunting, mild to moderate PH with EF >55%. From cardiac standpoint, she feels things are stable. Breathing stable. Is s/p bilateral inguinal hernia repair.    Past Medical History:  Diagnosis Date   A-fib [facility])    a.) CHA2DS2-VASc = 5 (age x2, sex, HTN, vascular disease) as of 10/08/2023; b.) scheduled for DCCV 10/29/220 - arrived in [place] - procedure cancelled; c.) s/p WACA/PVI ablation 05/21/2021; d.) cardiac rate/rhythm maintained on oral metoprolol succinate; chronically anticoagulated using apixaban   Adenomatous colon polyp    Aortic atherosclerosis (HCC)    Arthritis    Ascending aorta dilation (HCC)    a.) TTE 03/06/2016: asc Ao 40 mm, Ao root 32 mm; b.) TTE 10/13/2018: Ao root 32 mm; c.) TTE 02/04/2023: Ao root 28 mm, asc Ao 39 mm   Bilateral carotid artery disease (HCC)    Bilateral carpal tunnel syndrome 12/08/2017   Bilateral inguinal hernia    Cervical spondylosis with radiculopathy    a.) s/p ACDF C5-C6 2016   Chronic cough    @ NIGHT/ SEASONAL ALLERGIES   CKD (chronic kidney disease), stage III (HCC)    Clostridium difficile infection 2013-2014   Concussion    Coronary artery calcification seen on CT scan    Depression    Diastolic dysfunction 03/06/2016   a.) TTE 03/06/16: EF >55%, G1DD, RVSP 46.5, mild MR, mod TR, asc Ao 40 mm, Ao root 32 mm; b.) TTE 10/13/18: EF >55%, mild LVH, RVSP 35.1, mild MR/TR/PR, Ao root 32 mm; c.) TTE  08/19/20: EF >55%, mild RVE, mild BAE, G1DD, RVSP 42.4, triv AR/PR, mild MR, mod TR; d.) TTE 02/04/2023: EF >55%, mild LAE, RVSP 54.8, PFO (prior transeptal puncture) with L-R IAS, mild MR, mod TR, Ao root 28 mm, asc Ao 39 mm   Diverticulosis    GERD (gastroesophageal reflux disease)    Glaucoma    Gout    Hair loss    a.) on minoxidil   Hemorrhoids    History of bilateral cataract extraction    History of chicken pox    History of colonic polyps    History of shingles    Hypercholesterolemia    Hypertension    IBS (irritable bowel syndrome)    IDA (iron deficiency anemia)    On reduced dose apixaban therapy    Osteoarthritis    Osteopenia    Palpitations    Paresthesia of both hands    PFO (patent foramen ovale)    a.) TTE 02/04/2023: PFO (prior transeptal puncture) with L-R IAS   Pneumonia    Pulmonary HTN (HCC) 03/06/2016   a.) TTE 03/06/2016: RVSP 46.5; b.) TTE 10/13/2018: RVSP 35.1; c.) TTE 08/19/2020: RVSP 42.4; d.) TTE 02/04/2023: RVSP 54.8   Right leg paresthesias (lower leg and foot)    a.) following spinal surgery   Secondary hyperparathyroidism of renal origin (HCC)    Sleep apnea    a.) mild; does not  require nocturnal PAP therapy   SOB (shortness of breath)    Urinary incontinence    Past Surgical History:  Procedure Laterality Date   ABDOMINAL HYSTERECTOMY  1979   ANTERIOR CERVICAL DECOMP/DISCECTOMY FUSION N/A 11/28/2014   Procedure: CERVICAL FIVE-SIX  ANTERIOR CERVICAL DECOMPRESSION/DISCECTOMY FUSION 1 LEVEL;  Surgeon: Tressie Stalker, MD;  Location: [facility];  Service: Neurosurgery;  Laterality: N/A;  C56 anterior cervical decompression with fusion interbody prosthesis plating and bonegraft   BACK SURGERY     X2/ L4,5 & S1/ Cervical C7?   BLADDER SURGERY  1981   SUSPENSION   BREAST REDUCTION SURGERY     CARDIOVERSION N/A 10/29/2020   Procedure: CARDIOVERSION;  Surgeon: Lamar Blinks, MD;  Location: [facility];  Service: Cardiovascular;  Laterality:  N/A; CANCELLED AS PATIENT WAS IN SINUS RHYTHM   CARPAL TUNNEL RELEASE  2007   CATARACT EXTRACTION W/PHACO Right 12/30/2016   Procedure: CATARACT EXTRACTION PHACO AND INTRAOCULAR LENS PLACEMENT (IOC)  Right toric lens;  Surgeon: Lockie Mola, MD;  Location: [facility];  Service: Ophthalmology;  Laterality: Right   CATARACT EXTRACTION W/PHACO Left 01/20/2017   Procedure: CATARACT EXTRACTION PHACO AND INTRAOCULAR LENS PLACEMENT (IOC)  Left Toric;  Surgeon: Lockie Mola, MD;  Location: [facility];  Service: Ophthalmology;  Laterality: Left;  toric lens   CHOLECYSTECTOMY  1996   COLONOSCOPY     COLONOSCOPY WITH PROPOFOL N/A 05/22/2019   Procedure: COLONOSCOPY WITH PROPOFOL;  Surgeon: Toledo, Boykin Nearing, MD;  Location: [facility];  Service: Gastroenterology;  Laterality: N/A;   FOOT SURGERY  2005   LAMINECTOMY  1983 & 1985   REDUCTION MAMMAPLASTY Bilateral 2001   ROTATOR CUFF REPAIR  2000 & 2004   left and right   SPINAL CORD STIMULATOR IMPLANT  03/1997   SPINAL CORD STIMULATOR REMOVAL  2007   Tendon repair right shoulder     TONSILLECTOMY     TOTAL KNEE ARTHROPLASTY  10/2011 & 09/13   left and right   Family History  Problem Relation Age of Onset   Heart disease Mother    Stroke Mother    Hypertension Mother    Hyperlipidemia Father    Heart disease Father    Heart disease Brother        2 brothers   Alzheimer's disease Brother    Heart disease Sister        pacemaker   Breast cancer Daughter 58   Social History   Socioeconomic History   Marital status: Widowed    Spouse name: Not on file   Number of children: 2   Years of education: Not on file   Highest education level: 12th grade  Occupational History   Not on file  Tobacco Use   Smoking status: Former    Current packs/day: 0.00    Average packs/day: 1 pack/day for 20.0 years (20.0 ttl pk-yrs)    Types: Cigarettes    Start date: 09/07/1968    Quit date: 09/07/1988    Years since  quitting: 35.2   Smokeless tobacco: Never  Vaping Use   Vaping status: Never Used  Substance and Sexual Activity   Alcohol use: Yes    Alcohol/week: 2.0 standard drinks of alcohol    Types: 2 Glasses of wine per week    Comment: occasional   Drug use: No   Sexual activity: Never  Other Topics Concern   Not on file  Social History Narrative   She is widowed, has two daughters. [place];  quit smoking- in 1990s; social alcohol.    Social Drivers of Corporate investment banker Strain: Low Risk  (10/06/2023)   Overall Financial Resource Strain (CARDIA)    Difficulty of Paying Living Expenses: Not hard at all  Food Insecurity: No Food Insecurity (10/06/2023)   Hunger Vital Sign    Worried About Running Out of Food in the Last Year: Never true    Ran Out of Food in the Last Year: Never true  Transportation Needs: No Transportation Needs (10/06/2023)   PRAPARE - Administrator, Civil Service (Medical): No    Lack of Transportation (Non-Medical): No  Physical Activity: Insufficiently Active (10/06/2023)   Exercise Vital Sign    Days of Exercise per Week: 3 days    Minutes of Exercise per Session: 20 min  Stress: No Stress Concern Present (10/06/2023)   [employer] of Occupational Health - Occupational Stress Questionnaire    Feeling of Stress : Not at all  Social Connections: Moderately Integrated (10/06/2023)   Social Connection and Isolation Panel [NHANES]    Frequency of Communication with Friends and Family: More than three times a week    Frequency of Social Gatherings with Friends and Family: More than three times a week    Attends Religious Services: More than 4 times per year    Active Member of [employer] or Organizations: Yes    Attends Banker Meetings: More than 4 times per year    Marital Status: Widowed     Review of Systems     Objective:     LMP 08/15/1978  Wt Readings from Last 3 Encounters:  10/11/23 148 lb (67.1 kg)  10/07/23 148 lb  (67.1 kg)  10/06/23 148 lb (67.1 kg)    Physical Exam  {Perform Simple Foot Exam  Perform Detailed exam:1} {Insert foot Exam (Optional):30965}   Outpatient Encounter Medications as of 12/13/2023  Medication Sig   cholecalciferol (VITAMIN D3) 25 MCG (1000 UNIT) tablet Take 2,000 Units by mouth daily.   cyanocobalamin 1000 MCG tablet Take 1,000 mcg by mouth every other day.   ELIQUIS 2.5 MG TABS tablet Take 2.5 mg by mouth 2 (two) times daily.   finasteride (PROSCAR) 5 MG tablet TAKE 1 TABLET DAILY   fluticasone (FLONASE) 50 MCG/ACT nasal spray Place 1 spray into both nostrils daily as needed for allergies or rhinitis.   ibuprofen (ADVIL) 800 MG tablet Take 1 tablet (800 mg total) by mouth every 8 (eight) hours as needed.   latanoprost (XALATAN) 0.005 % ophthalmic solution Place 1 drop into both eyes at bedtime.    Lifitegrast (XIIDRA) 5 % SOLN Place 1 drop into both eyes daily.   losartan (COZAAR) 25 MG tablet Take 25 mg by mouth at bedtime.   metoprolol succinate (TOPROL-XL) 25 MG 24 hr tablet Take 25 mg by mouth daily.   minoxidil (LONITEN) 2.5 MG tablet TAKE 1 TABLET DAILY   ondansetron (ZOFRAN) 4 MG tablet Take 1 tablet (4 mg total) by mouth every 8 (eight) hours as needed for nausea or vomiting.   pantoprazole (PROTONIX) 20 MG tablet TAKE 1 TABLET EVERY OTHER DAY   rosuvastatin (CRESTOR) 10 MG tablet TAKE 1 TABLET DAILY   tolterodine (DETROL LA) 4 MG 24 hr capsule TAKE 1 CAPSULE DAILY   Facility-Administered Encounter Medications as of 12/13/2023  Medication   Tralokinumab-ldrm SOSY 300 mg     Lab Results  Component Value Date   WBC 6.0 09/22/2023   HGB 11.2 (L)  09/22/2023   HCT 34.2 (L) 09/22/2023   PLT 293 09/22/2023   GLUCOSE 96 09/22/2023   CHOL 194 09/13/2023   TRIG 120.0 09/13/2023   HDL 101.10 09/13/2023   LDLDIRECT 145.0 01/06/2021   LDLCALC 69 09/13/2023   ALT 15 09/13/2023   AST 25 09/13/2023   NA 136 09/22/2023   K 4.3 09/22/2023   CL 105 09/22/2023    CREATININE 1.08 (H) 09/22/2023   BUN 30 (H) 09/22/2023   CO2 22 09/22/2023   TSH 1.67 09/13/2023   INR 1.0 10/16/2013   HGBA1C 5.5 07/18/2019    No results found.     Assessment & Plan:  There are no diagnoses linked to this encounter.   Dale , MD

## 2023-12-20 ENCOUNTER — Ambulatory Visit: Admitting: Internal Medicine

## 2023-12-20 ENCOUNTER — Encounter: Payer: Self-pay | Admitting: Internal Medicine

## 2023-12-20 VITALS — BP 118/70 | HR 78 | Temp 97.9°F | Resp 16 | Ht 64.0 in | Wt 148.6 lb

## 2023-12-20 DIAGNOSIS — I4891 Unspecified atrial fibrillation: Secondary | ICD-10-CM | POA: Diagnosis not present

## 2023-12-20 DIAGNOSIS — N1832 Chronic kidney disease, stage 3b: Secondary | ICD-10-CM | POA: Diagnosis not present

## 2023-12-20 DIAGNOSIS — E611 Iron deficiency: Secondary | ICD-10-CM

## 2023-12-20 DIAGNOSIS — I712 Thoracic aortic aneurysm, without rupture, unspecified: Secondary | ICD-10-CM

## 2023-12-20 DIAGNOSIS — K219 Gastro-esophageal reflux disease without esophagitis: Secondary | ICD-10-CM | POA: Diagnosis not present

## 2023-12-20 DIAGNOSIS — E7800 Pure hypercholesterolemia, unspecified: Secondary | ICD-10-CM

## 2023-12-20 DIAGNOSIS — E538 Deficiency of other specified B group vitamins: Secondary | ICD-10-CM | POA: Diagnosis not present

## 2023-12-20 DIAGNOSIS — K4090 Unilateral inguinal hernia, without obstruction or gangrene, not specified as recurrent: Secondary | ICD-10-CM | POA: Diagnosis not present

## 2023-12-20 DIAGNOSIS — I10 Essential (primary) hypertension: Secondary | ICD-10-CM | POA: Diagnosis not present

## 2023-12-20 NOTE — Progress Notes (Signed)
 Subjective:    Patient ID: Leah Huber, female    DOB: [birth year], 83 y.o.   MRN: [id number]  Patient here for  Chief Complaint  Patient presents with   Medical Management of Chronic Issues    HPI Here for a scheduled follow up. Seeing Dr Valentine Gasmen - last 09/22/23 - recommended to proceed with iron  infusion. Has planned f/u tomorrow. Continues on oral B12 every other day. Had f/u with GI 04/01/23. Recommended continuing protonix . Had f/u with nephrology 03/03/23 - stable. Saw cardiology 01/21/23 - recommended continuing on toprol, eliquis. Recommended echo. S/p echo 01/2023 - moderate TR, mild MR, PFO with Lto R shunting, mild to moderate PH with EF >55%. From cardiac standpoint, she feels things are stable. Breathing stable. Is s/p bilateral inguinal hernia repair. Had f/u with Dr Charmel Cooter 11/02/23. Doing well from surgery. Increased pain initially. No increased pain now. Bowels moving.    Past Medical History:  Diagnosis Date   A-fib [facility])    a.) CHA2DS2-VASc = 5 (age x2, sex, HTN, vascular disease) as of 10/08/2023; b.) scheduled for DCCV 10/29/220 - arrived in [place] - procedure cancelled; c.) s/p WACA/PVI ablation 05/21/2021; d.) cardiac rate/rhythm maintained on oral metoprolol succinate; chronically anticoagulated using apixaban   Adenomatous colon polyp    Aortic atherosclerosis (HCC)    Arthritis    Ascending aorta dilation (HCC)    a.) TTE 03/06/2016: asc Ao 40 mm, Ao root 32 mm; b.) TTE 10/13/2018: Ao root 32 mm; c.) TTE 02/04/2023: Ao root 28 mm, asc Ao 39 mm   Bilateral carotid artery disease (HCC)    Bilateral carpal tunnel syndrome 12/08/2017   Bilateral inguinal hernia    Cervical spondylosis with radiculopathy    a.) s/p ACDF C5-C6 2016   Chronic cough    @ NIGHT/ SEASONAL ALLERGIES   CKD (chronic kidney disease), stage III (HCC)    Clostridium difficile infection 2013-2014   Concussion    Coronary artery calcification seen on CT scan    Depression    Diastolic  dysfunction 03/06/2016   a.) TTE 03/06/16: EF >55%, G1DD, RVSP 46.5, mild MR, mod TR, asc Ao 40 mm, Ao root 32 mm; b.) TTE 10/13/18: EF >55%, mild LVH, RVSP 35.1, mild MR/TR/PR, Ao root 32 mm; c.) TTE 08/19/20: EF >55%, mild RVE, mild BAE, G1DD, RVSP 42.4, triv AR/PR, mild MR, mod TR; d.) TTE 02/04/2023: EF >55%, mild LAE, RVSP 54.8, PFO (prior transeptal puncture) with L-R IAS, mild MR, mod TR, Ao root 28 mm, asc Ao 39 mm   Diverticulosis    GERD (gastroesophageal reflux disease)    Glaucoma    Gout    Hair loss    a.) on minoxidil    Hemorrhoids    History of bilateral cataract extraction    History of chicken pox    History of colonic polyps    History of shingles    Hypercholesterolemia    Hypertension    IBS (irritable bowel syndrome)    IDA (iron  deficiency anemia)    On reduced dose apixaban therapy    Osteoarthritis    Osteopenia    Palpitations    Paresthesia of both hands    PFO (patent foramen ovale)    a.) TTE 02/04/2023: PFO (prior transeptal puncture) with L-R IAS   Pneumonia    Pulmonary HTN (HCC) 03/06/2016   a.) TTE 03/06/2016: RVSP 46.5; b.) TTE 10/13/2018: RVSP 35.1; c.) TTE 08/19/2020: RVSP 42.4; d.) TTE 02/04/2023: RVSP 54.8   Right  leg paresthesias (lower leg and foot)    a.) following spinal surgery   Secondary hyperparathyroidism of renal origin (HCC)    Sleep apnea    a.) mild; does not require nocturnal PAP therapy   SOB (shortness of breath)    Urinary incontinence    Past Surgical History:  Procedure Laterality Date   ABDOMINAL HYSTERECTOMY  1979   ANTERIOR CERVICAL DECOMP/DISCECTOMY FUSION N/A 11/28/2014   Procedure: CERVICAL FIVE-SIX  ANTERIOR CERVICAL DECOMPRESSION/DISCECTOMY FUSION 1 LEVEL;  Surgeon: Garry Kansas, MD;  Location: [facility];  Service: Neurosurgery;  Laterality: N/A;  C56 anterior cervical decompression with fusion interbody prosthesis plating and bonegraft   BACK SURGERY     X2/ L4,5 & S1/ Cervical C7?   BLADDER SURGERY   1981   SUSPENSION   BREAST REDUCTION SURGERY     CARDIOVERSION N/A 10/29/2020   Procedure: CARDIOVERSION;  Surgeon: Michelle Aid, MD;  Location: [facility];  Service: Cardiovascular;  Laterality: N/A; CANCELLED AS PATIENT WAS IN SINUS RHYTHM   CARPAL TUNNEL RELEASE  2007   CATARACT EXTRACTION W/PHACO Right 12/30/2016   Procedure: CATARACT EXTRACTION PHACO AND INTRAOCULAR LENS PLACEMENT (IOC)  Right toric lens;  Surgeon: Annell Kidney, MD;  Location: [facility];  Service: Ophthalmology;  Laterality: Right   CATARACT EXTRACTION W/PHACO Left 01/20/2017   Procedure: CATARACT EXTRACTION PHACO AND INTRAOCULAR LENS PLACEMENT (IOC)  Left Toric;  Surgeon: Annell Kidney, MD;  Location: [facility];  Service: Ophthalmology;  Laterality: Left;  toric lens   CHOLECYSTECTOMY  1996   COLONOSCOPY     COLONOSCOPY WITH PROPOFOL  N/A 05/22/2019   Procedure: COLONOSCOPY WITH PROPOFOL ;  Surgeon: Toledo, Alphonsus Jeans, MD;  Location: [facility];  Service: Gastroenterology;  Laterality: N/A;   FOOT SURGERY  2005   LAMINECTOMY  1983 & 1985   REDUCTION MAMMAPLASTY Bilateral 2001   ROTATOR CUFF REPAIR  2000 & 2004   left and right   SPINAL CORD STIMULATOR IMPLANT  03/1997   SPINAL CORD STIMULATOR REMOVAL  2007   Tendon repair right shoulder     TONSILLECTOMY     TOTAL KNEE ARTHROPLASTY  10/2011 & 09/13   left and right   Family History  Problem Relation Age of Onset   Heart disease Mother    Stroke Mother    Hypertension Mother    Hyperlipidemia Father    Heart disease Father    Heart disease Brother        2 brothers   Alzheimer's disease Brother    Heart disease Sister        pacemaker   Breast cancer Daughter 37   Social History   Socioeconomic History   Marital status: Widowed    Spouse name: Not on file   Number of children: 2   Years of education: Not on file   Highest education level: 12th grade  Occupational History   Not on file  Tobacco Use   Smoking  status: Former    Current packs/day: 0.00    Average packs/day: 1 pack/day for 20.0 years (20.0 ttl pk-yrs)    Types: Cigarettes    Start date: 09/07/1968    Quit date: 09/07/1988    Years since quitting: 35.3   Smokeless tobacco: Never  Vaping Use   Vaping status: Never Used  Substance and Sexual Activity   Alcohol use: Yes    Alcohol/week: 2.0 standard drinks of alcohol    Types: 2 Glasses of wine per week    Comment:  occasional   Drug use: No   Sexual activity: Never  Other Topics Concern   Not on file  Social History Narrative   She is widowed, has two daughters. [place]; quit smoking- in 1990s; social alcohol.    Social Drivers of Corporate investment banker Strain: Low Risk  (10/06/2023)   Overall Financial Resource Strain (CARDIA)    Difficulty of Paying Living Expenses: Not hard at all  Food Insecurity: No Food Insecurity (10/06/2023)   Hunger Vital Sign    Worried About Running Out of Food in the Last Year: Never true    Ran Out of Food in the Last Year: Never true  Transportation Needs: No Transportation Needs (10/06/2023)   PRAPARE - Administrator, Civil Service (Medical): No    Lack of Transportation (Non-Medical): No  Physical Activity: Insufficiently Active (10/06/2023)   Exercise Vital Sign    Days of Exercise per Week: 3 days    Minutes of Exercise per Session: 20 min  Stress: No Stress Concern Present (10/06/2023)   [employer] of Occupational Health - Occupational Stress Questionnaire    Feeling of Stress : Not at all  Social Connections: Moderately Integrated (10/06/2023)   Social Connection and Isolation Panel [NHANES]    Frequency of Communication with Friends and Family: More than three times a week    Frequency of Social Gatherings with Friends and Family: More than three times a week    Attends Religious Services: More than 4 times per year    Active Member of [employer] or Organizations: Yes    Attends Banker Meetings: More  than 4 times per year    Marital Status: Widowed     Review of Systems  Constitutional:  Negative for appetite change and unexpected weight change.  HENT:  Negative for congestion and sinus pressure.   Respiratory:  Negative for cough, chest tightness and shortness of breath.   Cardiovascular:  Negative for chest pain, palpitations and leg swelling.  Gastrointestinal:  Negative for abdominal pain, diarrhea, nausea and vomiting.  Genitourinary:  Negative for difficulty urinating and dysuria.  Musculoskeletal:  Negative for joint swelling and myalgias.  Skin:  Negative for color change and rash.  Neurological:  Negative for dizziness and headaches.  Psychiatric/Behavioral:  Negative for agitation and dysphoric mood.        Objective:     BP 118/70   Pulse 78   Temp 97.9 F (36.6 C)   Resp 16   Ht 5\' 4"  (1.626 m)   Wt 148 lb 9.6 oz (67.4 kg)   LMP 08/15/1978   SpO2 98%   BMI 25.51 kg/m  Wt Readings from Last 3 Encounters:  12/20/23 148 lb 9.6 oz (67.4 kg)  10/11/23 148 lb (67.1 kg)  10/07/23 148 lb (67.1 kg)    Physical Exam Vitals reviewed.  Constitutional:      General: She is not in acute distress.    Appearance: Normal appearance.  HENT:     Head: Normocephalic and atraumatic.     Right Ear: External ear normal.     Left Ear: External ear normal.     Mouth/Throat:     Pharynx: No oropharyngeal exudate or posterior oropharyngeal erythema.  Eyes:     General: No scleral icterus.       Right eye: No discharge.        Left eye: No discharge.     Conjunctiva/sclera: Conjunctivae normal.  Neck:     Thyroid :  No thyromegaly.  Cardiovascular:     Rate and Rhythm: Normal rate and regular rhythm.  Pulmonary:     Effort: No respiratory distress.     Breath sounds: Normal breath sounds. No wheezing.  Abdominal:     General: Bowel sounds are normal.     Palpations: Abdomen is soft.     Tenderness: There is no abdominal tenderness.  Musculoskeletal:         General: No swelling or tenderness.     Cervical back: Neck supple. No tenderness.  Lymphadenopathy:     Cervical: No cervical adenopathy.  Skin:    Findings: No erythema or rash.  Neurological:     Mental Status: She is alert.  Psychiatric:        Mood and Affect: Mood normal.        Behavior: Behavior normal.         Outpatient Encounter Medications as of 12/20/2023  Medication Sig   cholecalciferol (VITAMIN D3) 25 MCG (1000 UNIT) tablet Take 2,000 Units by mouth daily.   cyanocobalamin  1000 MCG tablet Take 1,000 mcg by mouth every other day.   ELIQUIS 2.5 MG TABS tablet Take 2.5 mg by mouth 2 (two) times daily.   finasteride  (PROSCAR ) 5 MG tablet TAKE 1 TABLET DAILY   fluticasone  (FLONASE ) 50 MCG/ACT nasal spray Place 1 spray into both nostrils daily as needed for allergies or rhinitis.   ibuprofen  (ADVIL ) 800 MG tablet Take 1 tablet (800 mg total) by mouth every 8 (eight) hours as needed.   latanoprost (XALATAN) 0.005 % ophthalmic solution Place 1 drop into both eyes at bedtime.    Lifitegrast (XIIDRA) 5 % SOLN Place 1 drop into both eyes daily.   losartan  (COZAAR ) 25 MG tablet Take 25 mg by mouth at bedtime.   metoprolol succinate (TOPROL-XL) 25 MG 24 hr tablet Take 25 mg by mouth daily.   minoxidil  (LONITEN ) 2.5 MG tablet TAKE 1 TABLET DAILY   ondansetron  (ZOFRAN ) 4 MG tablet Take 1 tablet (4 mg total) by mouth every 8 (eight) hours as needed for nausea or vomiting.   pantoprazole  (PROTONIX ) 20 MG tablet TAKE 1 TABLET EVERY OTHER DAY   rosuvastatin  (CRESTOR ) 10 MG tablet TAKE 1 TABLET DAILY   tolterodine  (DETROL  LA) 4 MG 24 hr capsule TAKE 1 CAPSULE DAILY   Facility-Administered Encounter Medications as of 12/20/2023  Medication   Tralokinumab -ldrm SOSY 300 mg     Lab Results  Component Value Date   WBC 6.1 12/21/2023   HGB 10.8 (L) 12/21/2023   HCT 33.0 (L) 12/21/2023   PLT 274 12/21/2023   GLUCOSE 94 12/21/2023   CHOL 196 12/20/2023   TRIG 69.0 12/20/2023   HDL  113.70 12/20/2023   LDLDIRECT 145.0 01/06/2021   LDLCALC 69 12/20/2023   ALT 11 12/20/2023   AST 20 12/20/2023   NA 134 (L) 12/21/2023   K 4.4 12/21/2023   CL 102 12/21/2023   CREATININE 1.25 (H) 12/21/2023   BUN 21 12/21/2023   CO2 20 (L) 12/21/2023   TSH 1.67 09/13/2023   INR 1.0 10/16/2013   HGBA1C 5.5 07/18/2019       Assessment & Plan:  Stage 3b chronic kidney disease (HCC) Assessment & Plan: Avoid nephrotoxins.  On losartan . Follow pressures.  Follow metabolic panel. Continue f/u with nephrology.   Orders: -     Phosphorus -     Parathyroid  hormone, intact (no Ca)  Primary hypertension Assessment & Plan: Continue on losartan  and metoprolol.  Blood  pressure doing well.  Continue same medication regimen. Follow pressures.  Follow metabolic panel.   Orders: -     Basic metabolic panel with GFR  Hypercholesterolemia Assessment & Plan: Continue crestor . Low cholesterol diet and exercise. Follow lipid panel and liver function tests.   Orders: -     Hepatic function panel -     Lipid panel  Atrial fibrillation, unspecified type [facility]) Assessment & Plan: S/p ablation in September. On metoprolol. Continues on eliquis. Doing well.    Thoracic aortic aneurysm without rupture, unspecified part [facility]) Assessment & Plan: Has been followed by cardiology as outlined.    B12 deficiency Assessment & Plan: Continues oral B12.    Gastroesophageal reflux disease, unspecified whether esophagitis present Assessment & Plan: Continues on protonix . No upper symptoms reported.    Non-recurrent inguinal hernia without obstruction or gangrene, unspecified laterality Assessment & Plan: S/p bilateral inguinal hernia repair. Doing well. Follow.    Iron  deficiency Assessment & Plan: Seeing Dr Valentine Gasmen - last 05/24/23 - recommended to proceed with iron  infusion. Continues on oral B12 every other day. Had f/u with GI 04/01/23. Recommended continuing protonix . Had f/u with  nephrology 03/03/23 - stable. Due to f/u with Dr Valentine Gasmen tomorrow.       Dellar Fenton, MD

## 2023-12-21 ENCOUNTER — Inpatient Hospital Stay: Payer: Medicare Other

## 2023-12-21 ENCOUNTER — Inpatient Hospital Stay (HOSPITAL_BASED_OUTPATIENT_CLINIC_OR_DEPARTMENT_OTHER): Payer: Medicare Other | Admitting: Internal Medicine

## 2023-12-21 ENCOUNTER — Encounter: Payer: Self-pay | Admitting: Internal Medicine

## 2023-12-21 ENCOUNTER — Inpatient Hospital Stay: Payer: Medicare Other | Attending: Internal Medicine

## 2023-12-21 VITALS — BP 126/54 | HR 71

## 2023-12-21 DIAGNOSIS — D649 Anemia, unspecified: Secondary | ICD-10-CM

## 2023-12-21 DIAGNOSIS — E611 Iron deficiency: Secondary | ICD-10-CM

## 2023-12-21 DIAGNOSIS — I129 Hypertensive chronic kidney disease with stage 1 through stage 4 chronic kidney disease, or unspecified chronic kidney disease: Secondary | ICD-10-CM | POA: Diagnosis not present

## 2023-12-21 DIAGNOSIS — N183 Chronic kidney disease, stage 3 unspecified: Secondary | ICD-10-CM | POA: Insufficient documentation

## 2023-12-21 DIAGNOSIS — D509 Iron deficiency anemia, unspecified: Secondary | ICD-10-CM | POA: Diagnosis not present

## 2023-12-21 LAB — LIPID PANEL
Cholesterol: 196 mg/dL (ref 0–200)
HDL: 113.7 mg/dL (ref >39.00)
LDL Cholesterol: 69 mg/dL (ref 0–99)
NonHDL: 82.52
Total CHOL/HDL Ratio: 2
Triglycerides: 69 mg/dL (ref 0.0–149.0)
VLDL: 13.8 mg/dL (ref 0.0–40.0)

## 2023-12-21 LAB — BASIC METABOLIC PANEL WITH GFR
Anion gap: 12 (ref 5–15)
BUN: 20 mg/dL (ref 6–23)
BUN: 21 mg/dL (ref 8–23)
CO2: 24 meq/L (ref 19–32)
CO2: 20 mmol/L — ABNORMAL LOW (ref 22–32)
Calcium: 9.5 mg/dL (ref 8.4–10.5)
Calcium: 9.1 mg/dL (ref 8.9–10.3)
Chloride: 105 meq/L (ref 96–112)
Chloride: 102 mmol/L (ref 98–111)
Creatinine, Ser: 1.25 mg/dL — ABNORMAL HIGH (ref 0.40–1.20)
Creatinine, Ser: 1.25 mg/dL — ABNORMAL HIGH (ref 0.44–1.00)
GFR, Estimated: 43 mL/min — ABNORMAL LOW (ref >60)
GFR: 39.71 mL/min — ABNORMAL LOW (ref >60.00)
Glucose, Bld: 93 mg/dL (ref 70–99)
Glucose, Bld: 94 mg/dL (ref 70–99)
Potassium: 4.3 meq/L (ref 3.5–5.1)
Potassium: 4.4 mmol/L (ref 3.5–5.1)
Sodium: 138 meq/L (ref 135–145)
Sodium: 134 mmol/L — ABNORMAL LOW (ref 135–145)

## 2023-12-21 LAB — HEPATIC FUNCTION PANEL
ALT: 11 U/L (ref 0–35)
AST: 20 U/L (ref 0–37)
Albumin: 4.7 g/dL (ref 3.5–5.2)
Alkaline Phosphatase: 57 U/L (ref 39–117)
Bilirubin, Direct: 0.1 mg/dL (ref 0.0–0.3)
Total Bilirubin: 0.6 mg/dL (ref 0.2–1.2)
Total Protein: 7 g/dL (ref 6.0–8.3)

## 2023-12-21 LAB — CBC WITH DIFFERENTIAL (CANCER CENTER ONLY)
Abs Immature Granulocytes: 0.02 10*3/uL (ref 0.00–0.07)
Basophils Absolute: 0 10*3/uL (ref 0.0–0.1)
Basophils Relative: 1 %
Eosinophils Absolute: 0.1 10*3/uL (ref 0.0–0.5)
Eosinophils Relative: 1 %
HCT: 33 % — ABNORMAL LOW (ref 36.0–46.0)
Hemoglobin: 10.8 g/dL — ABNORMAL LOW (ref 12.0–15.0)
Immature Granulocytes: 0 %
Lymphocytes Relative: 38 %
Lymphs Abs: 2.3 10*3/uL (ref 0.7–4.0)
MCH: 32.8 pg (ref 26.0–34.0)
MCHC: 32.7 g/dL (ref 30.0–36.0)
MCV: 100.3 fL — ABNORMAL HIGH (ref 80.0–100.0)
Monocytes Absolute: 0.4 10*3/uL (ref 0.1–1.0)
Monocytes Relative: 7 %
Neutro Abs: 3.2 10*3/uL (ref 1.7–7.7)
Neutrophils Relative %: 53 %
Platelet Count: 274 10*3/uL (ref 150–400)
RBC: 3.29 MIL/uL — ABNORMAL LOW (ref 3.87–5.11)
RDW: 14.1 % (ref 11.5–15.5)
WBC Count: 6.1 10*3/uL (ref 4.0–10.5)
nRBC: 0 % (ref 0.0–0.2)

## 2023-12-21 LAB — IRON AND TIBC
Iron: 105 ug/dL (ref 28–170)
Saturation Ratios: 30 % (ref 10.4–31.8)
TIBC: 346 ug/dL (ref 250–450)
UIBC: 241 ug/dL

## 2023-12-21 LAB — LACTATE DEHYDROGENASE: LDH: 173 U/L (ref 98–192)

## 2023-12-21 LAB — VITAMIN B12: Vitamin B-12: 525 pg/mL (ref 180–914)

## 2023-12-21 LAB — PARATHYROID HORMONE, INTACT (NO CA): PTH: 42 pg/mL (ref 16–77)

## 2023-12-21 LAB — FERRITIN: Ferritin: 51 ng/mL (ref 11–307)

## 2023-12-21 LAB — PHOSPHORUS: Phosphorus: 3.4 mg/dL (ref 2.3–4.6)

## 2023-12-21 MED ORDER — SODIUM CHLORIDE 0.9% FLUSH
10.0000 mL | Freq: Once | INTRAVENOUS | Status: AC | PRN
  Administered 2023-12-21: 10 mL
  Filled 2023-12-21: qty 10

## 2023-12-21 MED ORDER — IRON SUCROSE 20 MG/ML IV SOLN
200.0000 mg | Freq: Once | INTRAVENOUS | Status: AC
  Administered 2023-12-21: 200 mg via INTRAVENOUS
  Filled 2023-12-21: qty 10

## 2023-12-21 NOTE — Patient Instructions (Signed)

## 2023-12-21 NOTE — Progress Notes (Signed)
Patient tolerated Venofer infusion well. Explained recommendation of 30 min post monitoring. Patient refused to wait post monitoring. Educated on what signs to watch for & to call with any concerns. No questions, discharged. Stable  

## 2023-12-21 NOTE — Assessment & Plan Note (Signed)
#  Anemia-iron deficiency; - nadir July 2022- Hemoglobin improved from 7 to 12.  INTOLERANCE PO gentle iron/ constipation [needing enema].  Proceed with iron infusion today- stable.   # Macrocytosis- ? unclear etiology- B12 JAN 2024- > 1000; recommend qOD on b12 pills;  / CKD vs others.  Discussed re: bone marrow dysfunction, but HOLD off  bone marrow biopsy at his time given response to IV iron.  Etiology of iron deficiency: [Oct 2022] s/p [facility] evaluation; s/p  barium esophagogram.  #A. fib [Dr.Thomas; [facility] ]-on Eliquis 2.5 mg twice daily- s/p ablation [[facility]-Oct 2022]- stable.   # CKD- Stage III- GFR 46- stable. [Dr.korrapati]  # DISPOSITION: # venofer today;  # follow up in 3 month-  MD; labs- cbc/bmp/iron studies/ferritin; LDH;B12 level- Possible venofer-Dr.B

## 2023-12-21 NOTE — Progress Notes (Signed)
 [facility] CONSULT NOTE  Patient Care Team: Dale [place], MD as PCP - General (Internal Medicine) Stanton Kidney, MD as Consulting Physician (Gastroenterology) Earna Coder, MD as Consulting Physician (Internal Medicine)  CHIEF COMPLAINTS/PURPOSE OF CONSULTATION: ANEMIA  HEMATOLOGY HISTORY:  # AUG 2022:  SEVERE ANEMIA [hb-7; ]  EGD/Colonoscopy:2020- last colo-55mm [facility]; Dr.Toledo]; Esophagogram- 2022- NEG for stricture. ? capsule  # CKD stage III [Dr.Korrapti]; A.fib [on eliquis on 2.5 mg BID;  Dr.Kowalski]   HISTORY OF PRESENTING ILLNESS: Alone. Ambulating independently.  Leah Huber 83 y.o.  female iron deficient/macrocytic anemia of unclear etiology- / CKD-III; A-fib on Eliquis is here for follow-up.   Pt feels tired and fatigue. Doesn't sleep as well as she used to.Pt feels that she is still active. Denies dizziness. Has dyspnea with exertion. A-Fib is under good control since ablation. No visible blood in stool. Has a good appetite    Review of Systems  Constitutional:  Positive for malaise/fatigue. Negative for chills, diaphoresis, fever and weight loss.  HENT:  Negative for nosebleeds and sore throat.   Eyes:  Negative for double vision.  Respiratory:  Negative for cough, hemoptysis, sputum production and wheezing.   Cardiovascular:  Negative for chest pain, palpitations, orthopnea and leg swelling.  Gastrointestinal:  Positive for constipation. Negative for blood in stool, heartburn, melena and vomiting.  Genitourinary:  Negative for dysuria, frequency and urgency.  Musculoskeletal:  Positive for back pain and joint pain.  Skin: Negative.  Negative for itching and rash.  Neurological:  Negative for tingling, focal weakness, weakness and headaches.  Endo/Heme/Allergies:  Does not bruise/bleed easily.  Psychiatric/Behavioral:  Negative for depression. The patient is not nervous/anxious and does not have insomnia.     MEDICAL HISTORY:  Past  Medical History:  Diagnosis Date   A-fib [facility]    a.) CHA2DS2-VASc = 5 (age x2, sex, HTN, vascular disease) as of 10/08/2023; b.) scheduled for DCCV 10/29/220 - arrived in SR - procedure cancelled; c.) s/p WACA/PVI ablation 05/21/2021; d.) cardiac rate/rhythm maintained on oral metoprolol succinate; chronically anticoagulated using apixaban   Adenomatous colon polyp    Aortic atherosclerosis (HCC)    Arthritis    Ascending aorta dilation (HCC)    a.) TTE 03/06/2016: asc Ao 40 mm, Ao root 32 mm; b.) TTE 10/13/2018: Ao root 32 mm; c.) TTE 02/04/2023: Ao root 28 mm, asc Ao 39 mm   Bilateral carotid artery disease (HCC)    Bilateral carpal tunnel syndrome 12/08/2017   Bilateral inguinal hernia    Cervical spondylosis with radiculopathy    a.) s/p ACDF C5-C6 2016   Chronic cough    @ NIGHT/ SEASONAL ALLERGIES   CKD (chronic kidney disease), stage III (HCC)    Clostridium difficile infection 2013-2014   Concussion    Coronary artery calcification seen on CT scan    Depression    Diastolic dysfunction 03/06/2016   a.) TTE 03/06/16: EF >55%, G1DD, RVSP 46.5, mild MR, mod TR, asc Ao 40 mm, Ao root 32 mm; b.) TTE 10/13/18: EF >55%, mild LVH, RVSP 35.1, mild MR/TR/PR, Ao root 32 mm; c.) TTE 08/19/20: EF >55%, mild RVE, mild BAE, G1DD, RVSP 42.4, triv AR/PR, mild MR, mod TR; d.) TTE 02/04/2023: EF >55%, mild LAE, RVSP 54.8, PFO (prior transeptal puncture) with L-R IAS, mild MR, mod TR, Ao root 28 mm, asc Ao 39 mm   Diverticulosis    GERD (gastroesophageal reflux disease)    Glaucoma    Gout  Hair loss    a.) on minoxidil   Hemorrhoids    History of bilateral cataract extraction    History of chicken pox    History of colonic polyps    History of shingles    Hypercholesterolemia    Hypertension    IBS (irritable bowel syndrome)    IDA (iron deficiency anemia)    On reduced dose apixaban therapy    Osteoarthritis    Osteopenia    Palpitations    Paresthesia of both hands    PFO (patent  foramen ovale)    a.) TTE 02/04/2023: PFO (prior transeptal puncture) with L-R IAS   Pneumonia    Pulmonary HTN (HCC) 03/06/2016   a.) TTE 03/06/2016: RVSP 46.5; b.) TTE 10/13/2018: RVSP 35.1; c.) TTE 08/19/2020: RVSP 42.4; d.) TTE 02/04/2023: RVSP 54.8   Right leg paresthesias (lower leg and foot)    a.) following spinal surgery   Secondary hyperparathyroidism of renal origin (HCC)    Sleep apnea    a.) mild; does not require nocturnal PAP therapy   SOB (shortness of breath)    Urinary incontinence     SURGICAL HISTORY: Past Surgical History:  Procedure Laterality Date   ABDOMINAL HYSTERECTOMY  1979   ANTERIOR CERVICAL DECOMP/DISCECTOMY FUSION N/A 11/28/2014   Procedure: CERVICAL FIVE-SIX  ANTERIOR CERVICAL DECOMPRESSION/DISCECTOMY FUSION 1 LEVEL;  Surgeon: Garry Kansas, MD;  Location: [facility];  Service: Neurosurgery;  Laterality: N/A;  C56 anterior cervical decompression with fusion interbody prosthesis plating and bonegraft   BACK SURGERY     X2/ L4,5 & S1/ Cervical C7?   BLADDER SURGERY  1981   SUSPENSION   BREAST REDUCTION SURGERY     CARDIOVERSION N/A 10/29/2020   Procedure: CARDIOVERSION;  Surgeon: Michelle Aid, MD;  Location: [facility] ORS;  Service: Cardiovascular;  Laterality: N/A; CANCELLED AS PATIENT WAS IN SINUS RHYTHM   CARPAL TUNNEL RELEASE  2007   CATARACT EXTRACTION W/PHACO Right 12/30/2016   Procedure: CATARACT EXTRACTION PHACO AND INTRAOCULAR LENS PLACEMENT (IOC)  Right toric lens;  Surgeon: Annell Kidney, MD;  Location: [facility];  Service: Ophthalmology;  Laterality: Right   CATARACT EXTRACTION W/PHACO Left 01/20/2017   Procedure: CATARACT EXTRACTION PHACO AND INTRAOCULAR LENS PLACEMENT (IOC)  Left Toric;  Surgeon: Annell Kidney, MD;  Location: [facility];  Service: Ophthalmology;  Laterality: Left;  toric lens   CHOLECYSTECTOMY  1996   COLONOSCOPY     COLONOSCOPY WITH PROPOFOL N/A 05/22/2019   Procedure: COLONOSCOPY WITH  PROPOFOL;  Surgeon: Toledo, Alphonsus Jeans, MD;  Location: [facility] ENDOSCOPY;  Service: Gastroenterology;  Laterality: N/A;   FOOT SURGERY  2005   LAMINECTOMY  1983 & 1985   REDUCTION MAMMAPLASTY Bilateral 2001   ROTATOR CUFF REPAIR  2000 & 2004   left and right   SPINAL CORD STIMULATOR IMPLANT  03/1997   SPINAL CORD STIMULATOR REMOVAL  2007   Tendon repair right shoulder     TONSILLECTOMY     TOTAL KNEE ARTHROPLASTY  10/2011 & 09/13   left and right    SOCIAL HISTORY: Social History   Socioeconomic History   Marital status: Widowed    Spouse name: Not on file   Number of children: 2   Years of education: Not on file   Highest education level: 12th grade  Occupational History   Not on file  Tobacco Use   Smoking status: Former    Current packs/day: 0.00    Average packs/day: 1 pack/day for 20.0 years (20.0  ttl pk-yrs)    Types: Cigarettes    Start date: 09/07/1968    Quit date: 09/07/1988    Years since quitting: 35.3   Smokeless tobacco: Never  Vaping Use   Vaping status: Never Used  Substance and Sexual Activity   Alcohol use: Yes    Alcohol/week: 2.0 standard drinks of alcohol    Types: 2 Glasses of wine per week    Comment: occasional   Drug use: No   Sexual activity: Never  Other Topics Concern   Not on file  Social History Narrative   She is widowed, has two daughters. [place]; quit smoking- in 1990s; social alcohol.    Social Drivers of Corporate investment banker Strain: Low Risk  (10/06/2023)   Overall Financial Resource Strain (CARDIA)    Difficulty of Paying Living Expenses: Not hard at all  Food Insecurity: No Food Insecurity (10/06/2023)   Hunger Vital Sign    Worried About Running Out of Food in the Last Year: Never true    Ran Out of Food in the Last Year: Never true  Transportation Needs: No Transportation Needs (10/06/2023)   PRAPARE - Administrator, Civil Service (Medical): No    Lack of Transportation (Non-Medical): No  Physical Activity:  Insufficiently Active (10/06/2023)   Exercise Vital Sign    Days of Exercise per Week: 3 days    Minutes of Exercise per Session: 20 min  Stress: No Stress Concern Present (10/06/2023)   [employer] of Occupational Health - Occupational Stress Questionnaire    Feeling of Stress : Not at all  Social Connections: Moderately Integrated (10/06/2023)   Social Connection and Isolation Panel [NHANES]    Frequency of Communication with Friends and Family: More than three times a week    Frequency of Social Gatherings with Friends and Family: More than three times a week    Attends Religious Services: More than 4 times per year    Active Member of [employer] or Organizations: Yes    Attends Banker Meetings: More than 4 times per year    Marital Status: Widowed  Intimate Partner Violence: Not At Risk (10/06/2023)   Humiliation, Afraid, Rape, and Kick questionnaire    Fear of Current or Ex-Partner: No    Emotionally Abused: No    Physically Abused: No    Sexually Abused: No    FAMILY HISTORY: Family History  Problem Relation Age of Onset   Heart disease Mother    Stroke Mother    Hypertension Mother    Hyperlipidemia Father    Heart disease Father    Heart disease Brother        2 brothers   Alzheimer's disease Brother    Heart disease Sister        pacemaker   Breast cancer Daughter 18    ALLERGIES:  is allergic to clindamycin/lincomycin, dronedarone, bactrim [sulfamethoxazole-trimethoprim], morphine, morphine and codeine, and sulfa antibiotics.  MEDICATIONS:  Current Outpatient Medications  Medication Sig Dispense Refill   cholecalciferol (VITAMIN D3) 25 MCG (1000 UNIT) tablet Take 2,000 Units by mouth daily.     cyanocobalamin 1000 MCG tablet Take 1,000 mcg by mouth every other day.     ELIQUIS 2.5 MG TABS tablet Take 2.5 mg by mouth 2 (two) times daily.     finasteride (PROSCAR) 5 MG tablet TAKE 1 TABLET DAILY 90 tablet 3   fluticasone (FLONASE) 50 MCG/ACT nasal  spray Place 1 spray into both nostrils daily as needed  for allergies or rhinitis.     ibuprofen (ADVIL) 800 MG tablet Take 1 tablet (800 mg total) by mouth every 8 (eight) hours as needed. 10 tablet 0   latanoprost (XALATAN) 0.005 % ophthalmic solution Place 1 drop into both eyes at bedtime.      Lifitegrast (XIIDRA) 5 % SOLN Place 1 drop into both eyes daily.     losartan (COZAAR) 25 MG tablet Take 25 mg by mouth at bedtime.     metoprolol succinate (TOPROL-XL) 25 MG 24 hr tablet Take 25 mg by mouth daily.     minoxidil (LONITEN) 2.5 MG tablet TAKE 1 TABLET DAILY 90 tablet 3   pantoprazole (PROTONIX) 20 MG tablet TAKE 1 TABLET EVERY OTHER DAY 45 tablet 3   rosuvastatin (CRESTOR) 10 MG tablet TAKE 1 TABLET DAILY 90 tablet 3   tolterodine (DETROL LA) 4 MG 24 hr capsule TAKE 1 CAPSULE DAILY 90 capsule 0   ondansetron (ZOFRAN) 4 MG tablet Take 1 tablet (4 mg total) by mouth every 8 (eight) hours as needed for nausea or vomiting. 20 tablet 0   Current Facility-Administered Medications  Medication Dose Route Frequency Provider Last Rate Last Admin   Tralokinumab-ldrm SOSY 300 mg  300 mg Subcutaneous Once Willeen Niece, MD          PHYSICAL EXAMINATION:   There were no vitals filed for this visit.  There were no vitals filed for this visit.    Physical Exam Vitals and nursing note reviewed.  HENT:     Head: Normocephalic and atraumatic.     Mouth/Throat:     Pharynx: Oropharynx is clear.  Eyes:     Extraocular Movements: Extraocular movements intact.     Pupils: Pupils are equal, round, and reactive to light.  Cardiovascular:     Rate and Rhythm: Normal rate. Rhythm irregular.  Pulmonary:     Comments: Decreased breath sounds bilaterally.  Abdominal:     Palpations: Abdomen is soft.  Musculoskeletal:        General: Normal range of motion.     Cervical back: Normal range of motion.  Skin:    General: Skin is warm.  Neurological:     General: No focal deficit present.      Mental Status: She is alert and oriented to person, place, and time.  Psychiatric:        Behavior: Behavior normal.        Judgment: Judgment normal.    LABORATORY DATA:  I have reviewed the data as listed Lab Results  Component Value Date   WBC 6.1 12/21/2023   HGB 10.8 (L) 12/21/2023   HCT 33.0 (L) 12/21/2023   MCV 100.3 (H) 12/21/2023   PLT 274 12/21/2023   Recent Labs    05/11/23 0900 05/24/23 1255 09/13/23 1505 09/22/23 0928 12/20/23 1542 12/21/23 1249  NA 139 132* 137 136 138 134*  K 4.6 4.3 4.3 4.3 4.3 4.4  CL 105 101 103 105 105 102  CO2 25 22 24 22 24  20*  GLUCOSE 86 93 91 96 93 94  BUN 22 22 29* 30* 20 21  CREATININE 1.13 1.32* 1.20 1.08* 1.25* 1.25*  CALCIUM 9.3 9.4 9.6 9.5 9.5 9.1  GFRNONAA  --  40*  --  51*  --  43*  PROT 7.0  --  7.0  --  7.0  --   ALBUMIN 4.3  --  4.6  --  4.7  --   AST 19  --  25  --  20  --   ALT 10  --  15  --  11  --   ALKPHOS 57  --  66  --  57  --   BILITOT 0.6  --  0.5  --  0.6  --   BILIDIR 0.1  --  0.1  --  0.1  --      No results found.  Symptomatic anemia #Anemia-iron deficiency; - nadir July 2022- Hemoglobin improved from 7 to 12.  INTOLERANCE PO gentle iron/ constipation [needing enema].  Proceed with iron infusion today- stable.   # Macrocytosis- ? unclear etiology- B12 JAN 2024- > 1000; recommend qOD on b12 pills;  / CKD vs others.  Discussed re: bone marrow dysfunction, but HOLD off  bone marrow biopsy at his time given response to IV iron.  Etiology of iron deficiency: [Oct 2022] s/p [facility] evaluation; s/p  barium esophagogram.  #A. fib [Dr.Thomas; [facility] ]-on Eliquis 2.5 mg twice daily- s/p ablation [[facility]-Oct 2022]- stable.   # CKD- Stage III- GFR 46- stable. [Dr.korrapati]  # DISPOSITION: # venofer today;  # follow up in 3 month-  MD; labs- cbc/bmp/iron studies/ferritin; LDH;B12 level- Possible venofer-Dr.B     All questions were answered. The patient knows to call the clinic with any problems, questions or  concerns.    Leah Leos, MD 12/21/2023 1:48 PM

## 2023-12-21 NOTE — Progress Notes (Signed)
 Pt feels tired and fatigue. Doesn't sleep as well as she used to.Pt feels that she is still active. Denies dizziness. Has dyspnea with exertion. A-Fib is under good control since ablation. No visible blood in stool. Has a good appetite.

## 2023-12-25 ENCOUNTER — Encounter: Payer: Self-pay | Admitting: Internal Medicine

## 2023-12-25 NOTE — Assessment & Plan Note (Signed)
 S/p ablation in September. On metoprolol. Continues on eliquis. Doing well.

## 2023-12-25 NOTE — Assessment & Plan Note (Signed)
 Continue on losartan  and metoprolol.  Blood pressure doing well.  Continue same medication regimen. Follow pressures.  Follow metabolic panel.

## 2023-12-25 NOTE — Assessment & Plan Note (Signed)
 S/p bilateral inguinal hernia repair. Doing well. Follow.

## 2023-12-25 NOTE — Assessment & Plan Note (Signed)
 Seeing Dr Valentine Gasmen - last 05/24/23 - recommended to proceed with iron  infusion. Continues on oral B12 every other day. Had f/u with GI 04/01/23. Recommended continuing protonix . Had f/u with nephrology 03/03/23 - stable. Due to f/u with Dr Valentine Gasmen tomorrow.

## 2023-12-25 NOTE — Assessment & Plan Note (Signed)
 Avoid nephrotoxins.  On losartan. Follow pressures.  Follow metabolic panel.   Continue f/u with nephrology.

## 2023-12-25 NOTE — Assessment & Plan Note (Signed)
 Has been followed by cardiology as outlined.

## 2023-12-25 NOTE — Assessment & Plan Note (Signed)
 Continue crestor.  Low cholesterol diet and exercise.  Follow lipid panel and liver function tests.

## 2023-12-25 NOTE — Assessment & Plan Note (Signed)
 Continues on protonix. No upper symptoms reported.

## 2023-12-25 NOTE — Assessment & Plan Note (Signed)
 Continues oral B12.

## 2024-01-24 DIAGNOSIS — H401131 Primary open-angle glaucoma, bilateral, mild stage: Secondary | ICD-10-CM | POA: Diagnosis not present

## 2024-02-10 DIAGNOSIS — N281 Cyst of kidney, acquired: Secondary | ICD-10-CM | POA: Diagnosis not present

## 2024-02-10 DIAGNOSIS — E7800 Pure hypercholesterolemia, unspecified: Secondary | ICD-10-CM | POA: Diagnosis not present

## 2024-02-10 DIAGNOSIS — R809 Proteinuria, unspecified: Secondary | ICD-10-CM | POA: Diagnosis not present

## 2024-02-10 DIAGNOSIS — I10 Essential (primary) hypertension: Secondary | ICD-10-CM | POA: Diagnosis not present

## 2024-02-10 DIAGNOSIS — N2581 Secondary hyperparathyroidism of renal origin: Secondary | ICD-10-CM | POA: Diagnosis not present

## 2024-02-10 DIAGNOSIS — D631 Anemia in chronic kidney disease: Secondary | ICD-10-CM | POA: Diagnosis not present

## 2024-02-10 DIAGNOSIS — N1832 Chronic kidney disease, stage 3b: Secondary | ICD-10-CM | POA: Diagnosis not present

## 2024-02-15 DIAGNOSIS — I10 Essential (primary) hypertension: Secondary | ICD-10-CM | POA: Diagnosis not present

## 2024-02-15 DIAGNOSIS — N1832 Chronic kidney disease, stage 3b: Secondary | ICD-10-CM | POA: Diagnosis not present

## 2024-02-15 DIAGNOSIS — D631 Anemia in chronic kidney disease: Secondary | ICD-10-CM | POA: Diagnosis not present

## 2024-02-15 DIAGNOSIS — E7800 Pure hypercholesterolemia, unspecified: Secondary | ICD-10-CM | POA: Diagnosis not present

## 2024-02-15 DIAGNOSIS — N281 Cyst of kidney, acquired: Secondary | ICD-10-CM | POA: Diagnosis not present

## 2024-02-15 DIAGNOSIS — R809 Proteinuria, unspecified: Secondary | ICD-10-CM | POA: Diagnosis not present

## 2024-02-15 DIAGNOSIS — U071 COVID-19: Secondary | ICD-10-CM | POA: Diagnosis not present

## 2024-02-15 DIAGNOSIS — N2581 Secondary hyperparathyroidism of renal origin: Secondary | ICD-10-CM | POA: Diagnosis not present

## 2024-02-25 ENCOUNTER — Telehealth: Payer: Self-pay | Admitting: Internal Medicine

## 2024-02-25 NOTE — Telephone Encounter (Signed)
 Doctor Geralyn Knee will be out of the office the day of your scheduled visit, 04/21/24. Please call the office at [phone] and we will get you rescheduled.   Thank you  E2C2, please reschedule if pt calls back. [facility]

## 2024-02-28 ENCOUNTER — Other Ambulatory Visit: Payer: Self-pay | Admitting: Internal Medicine

## 2024-03-13 ENCOUNTER — Ambulatory Visit: Admitting: Internal Medicine

## 2024-03-13 ENCOUNTER — Encounter: Payer: Self-pay | Admitting: Internal Medicine

## 2024-03-13 VITALS — BP 118/64 | HR 80 | Ht 64.0 in | Wt 144.8 lb

## 2024-03-13 DIAGNOSIS — N1832 Chronic kidney disease, stage 3b: Secondary | ICD-10-CM

## 2024-03-13 DIAGNOSIS — I10 Essential (primary) hypertension: Secondary | ICD-10-CM | POA: Diagnosis not present

## 2024-03-13 DIAGNOSIS — E538 Deficiency of other specified B group vitamins: Secondary | ICD-10-CM

## 2024-03-13 DIAGNOSIS — I712 Thoracic aortic aneurysm, without rupture, unspecified: Secondary | ICD-10-CM

## 2024-03-13 DIAGNOSIS — D692 Other nonthrombocytopenic purpura: Secondary | ICD-10-CM

## 2024-03-13 DIAGNOSIS — E7800 Pure hypercholesterolemia, unspecified: Secondary | ICD-10-CM | POA: Diagnosis not present

## 2024-03-13 DIAGNOSIS — D649 Anemia, unspecified: Secondary | ICD-10-CM

## 2024-03-13 DIAGNOSIS — Z860100 Personal history of colon polyps, unspecified: Secondary | ICD-10-CM | POA: Diagnosis not present

## 2024-03-13 DIAGNOSIS — R059 Cough, unspecified: Secondary | ICD-10-CM | POA: Diagnosis not present

## 2024-03-13 DIAGNOSIS — K219 Gastro-esophageal reflux disease without esophagitis: Secondary | ICD-10-CM

## 2024-03-13 DIAGNOSIS — R1010 Upper abdominal pain, unspecified: Secondary | ICD-10-CM

## 2024-03-13 DIAGNOSIS — I4891 Unspecified atrial fibrillation: Secondary | ICD-10-CM | POA: Diagnosis not present

## 2024-03-13 LAB — BASIC METABOLIC PANEL WITH GFR
BUN: 19 mg/dL (ref 6–23)
CO2: 25 meq/L (ref 19–32)
Calcium: 9.7 mg/dL (ref 8.4–10.5)
Chloride: 104 meq/L (ref 96–112)
Creatinine, Ser: 1.19 mg/dL (ref 0.40–1.20)
GFR: 42.06 mL/min — ABNORMAL LOW (ref >60.00)
Glucose, Bld: 93 mg/dL (ref 70–99)
Potassium: 4.2 meq/L (ref 3.5–5.1)
Sodium: 137 meq/L (ref 135–145)

## 2024-03-13 LAB — HEPATIC FUNCTION PANEL
ALT: 8 U/L (ref 0–35)
AST: 15 U/L (ref 0–37)
Albumin: 4.4 g/dL (ref 3.5–5.2)
Alkaline Phosphatase: 53 U/L (ref 39–117)
Bilirubin, Direct: 0.2 mg/dL (ref 0.0–0.3)
Total Bilirubin: 0.9 mg/dL (ref 0.2–1.2)
Total Protein: 7.2 g/dL (ref 6.0–8.3)

## 2024-03-13 LAB — LIPID PANEL
Cholesterol: 170 mg/dL (ref 0–200)
HDL: 101.2 mg/dL (ref >39.00)
LDL Cholesterol: 56 mg/dL (ref 0–99)
NonHDL: 69.05
Total CHOL/HDL Ratio: 2
Triglycerides: 66 mg/dL (ref 0.0–149.0)
VLDL: 13.2 mg/dL (ref 0.0–40.0)

## 2024-03-13 MED ORDER — AZELASTINE HCL 0.1 % NA SOLN: 1.0000 | Freq: Two times a day (BID) | NASAL | 0 refills | Status: DC

## 2024-03-13 NOTE — Progress Notes (Signed)
 Subjective:    Patient ID: Leah Huber, female    DOB: Apr 06, 1941, 83 y.o.   MRN: [id number]  Patient here for  Chief Complaint  Patient presents with   Medical Management of Chronic Issues    4 month follow up     HPI Here for a scheduled follow up. Seeing Dr Rennie - last 12/21/23 - recommended to proceed with iron  infusion. Continues on oral B12 every other day. Had f/u with GI 04/01/23. Recommended continuing protonix . Had f/u with nephrology 02/15/24 - stable. Saw cardiology 01/21/23 - recommended continuing on toprol, eliquis. Recommended echo. S/p echo 01/2023 - moderate TR, mild MR, PFO with Lto R shunting, mild to moderate PH with EF >55%. From cardiac standpoint, she feels things are stable. Breathing stable. Is s/p bilateral inguinal hernia repair. Had f/u with Dr Cesar 11/02/23. Doing well from surgery. Has noticed some cough - intermittent - 2 months. No fever. No sob. Some nasal congestion/drainage. Eating. Some lower abdominal discmfort. Has f/u with GI. Bowels stable. Discussed - keep bowels moving.    Past Medical History:  Diagnosis Date   A-fib [facility])    a.) CHA2DS2-VASc = 5 (age x2, sex, HTN, vascular disease) as of 10/08/2023; b.) scheduled for DCCV 10/29/220 - arrived in [place] - procedure cancelled; c.) s/p WACA/PVI ablation 05/21/2021; d.) cardiac rate/rhythm maintained on oral metoprolol succinate; chronically anticoagulated using apixaban   Adenomatous colon polyp    Aortic atherosclerosis (HCC)    Arthritis    Ascending aorta dilation (HCC)    a.) TTE 03/06/2016: asc Ao 40 mm, Ao root 32 mm; b.) TTE 10/13/2018: Ao root 32 mm; c.) TTE 02/04/2023: Ao root 28 mm, asc Ao 39 mm   Bilateral carotid artery disease (HCC)    Bilateral carpal tunnel syndrome 12/08/2017   Bilateral inguinal hernia    Cervical spondylosis with radiculopathy    a.) s/p ACDF C5-C6 2016   Chronic cough    @ NIGHT/ SEASONAL ALLERGIES   CKD (chronic kidney disease), stage III (HCC)     Clostridium difficile infection 2013-2014   Concussion    Coronary artery calcification seen on CT scan    Depression    Diastolic dysfunction 03/06/2016   a.) TTE 03/06/16: EF >55%, G1DD, RVSP 46.5, mild MR, mod TR, asc Ao 40 mm, Ao root 32 mm; b.) TTE 10/13/18: EF >55%, mild LVH, RVSP 35.1, mild MR/TR/PR, Ao root 32 mm; c.) TTE 08/19/20: EF >55%, mild RVE, mild BAE, G1DD, RVSP 42.4, triv AR/PR, mild MR, mod TR; d.) TTE 02/04/2023: EF >55%, mild LAE, RVSP 54.8, PFO (prior transeptal puncture) with L-R IAS, mild MR, mod TR, Ao root 28 mm, asc Ao 39 mm   Diverticulosis    GERD (gastroesophageal reflux disease)    Glaucoma    Gout    Hair loss    a.) on minoxidil    Hemorrhoids    History of bilateral cataract extraction    History of chicken pox    History of colonic polyps    History of shingles    Hypercholesterolemia    Hypertension    IBS (irritable bowel syndrome)    IDA (iron  deficiency anemia)    On reduced dose apixaban therapy    Osteoarthritis    Osteopenia    Palpitations    Paresthesia of both hands    PFO (patent foramen ovale)    a.) TTE 02/04/2023: PFO (prior transeptal puncture) with L-R IAS   Pneumonia    Pulmonary HTN (  HCC) 03/06/2016   a.) TTE 03/06/2016: RVSP 46.5; b.) TTE 10/13/2018: RVSP 35.1; c.) TTE 08/19/2020: RVSP 42.4; d.) TTE 02/04/2023: RVSP 54.8   Right leg paresthesias (lower leg and foot)    a.) following spinal surgery   Secondary hyperparathyroidism of renal origin (HCC)    Sleep apnea    a.) mild; does not require nocturnal PAP therapy   SOB (shortness of breath)    Urinary incontinence    Past Surgical History:  Procedure Laterality Date   ABDOMINAL HYSTERECTOMY  1979   ANTERIOR CERVICAL DECOMP/DISCECTOMY FUSION N/A 11/28/2014   Procedure: CERVICAL FIVE-SIX  ANTERIOR CERVICAL DECOMPRESSION/DISCECTOMY FUSION 1 LEVEL;  Surgeon: Reyes Budge, MD;  Location: [facility];  Service: Neurosurgery;  Laterality: N/A;  C56 anterior cervical  decompression with fusion interbody prosthesis plating and bonegraft   BACK SURGERY     X2/ L4,5 & S1/ Cervical C7?   BLADDER SURGERY  1981   SUSPENSION   BREAST REDUCTION SURGERY     CARDIOVERSION N/A 10/29/2020   Procedure: CARDIOVERSION;  Surgeon: Hester Wolm PARAS, MD;  Location: [facility];  Service: Cardiovascular;  Laterality: N/A; CANCELLED AS PATIENT WAS IN SINUS RHYTHM   CARPAL TUNNEL RELEASE  2007   CATARACT EXTRACTION W/PHACO Right 12/30/2016   Procedure: CATARACT EXTRACTION PHACO AND INTRAOCULAR LENS PLACEMENT (IOC)  Right toric lens;  Surgeon: Dene Etienne, MD;  Location: [facility];  Service: Ophthalmology;  Laterality: Right   CATARACT EXTRACTION W/PHACO Left 01/20/2017   Procedure: CATARACT EXTRACTION PHACO AND INTRAOCULAR LENS PLACEMENT (IOC)  Left Toric;  Surgeon: Etienne Dene, MD;  Location: [facility];  Service: Ophthalmology;  Laterality: Left;  toric lens   CHOLECYSTECTOMY  1996   COLONOSCOPY     COLONOSCOPY WITH PROPOFOL  N/A 05/22/2019   Procedure: COLONOSCOPY WITH PROPOFOL ;  Surgeon: Toledo, Ladell POUR, MD;  Location: [facility];  Service: Gastroenterology;  Laterality: N/A;   COSMETIC SURGERY     EYE SURGERY     FOOT SURGERY  2005   JOINT REPLACEMENT     LAMINECTOMY  1983 & 1985   REDUCTION MAMMAPLASTY Bilateral 2001   ROTATOR CUFF REPAIR  2000 & 2004   left and right   SPINAL CORD STIMULATOR IMPLANT  03/1997   SPINAL CORD STIMULATOR REMOVAL  2007   SPINE SURGERY     Tendon repair right shoulder     TONSILLECTOMY     TOTAL KNEE ARTHROPLASTY  10/2011 & 09/13   left and right   Family History  Problem Relation Age of Onset   Heart disease Mother    Stroke Mother    Hypertension Mother    Hyperlipidemia Father    Heart disease Father    Heart disease Brother        2 brothers   Alzheimer's disease Brother    Heart disease Sister        pacemaker   Breast cancer Daughter 95   Hyperlipidemia Brother    Social  History   Socioeconomic History   Marital status: Widowed    Spouse name: Not on file   Number of children: 2   Years of education: Not on file   Highest education level: 12th grade  Occupational History   Not on file  Tobacco Use   Smoking status: Former    Current packs/day: 0.00    Average packs/day: 1 pack/day for 20.0 years (20.0 ttl pk-yrs)    Types: Cigarettes    Start date: 09/07/1968    Quit  date: 09/07/1988    Years since quitting: 35.5   Smokeless tobacco: Never  Vaping Use   Vaping status: Never Used  Substance and Sexual Activity   Alcohol use: Yes    Alcohol/week: 2.0 standard drinks of alcohol    Types: 2 Glasses of wine per week    Comment: occasional   Drug use: No   Sexual activity: Never  Other Topics Concern   Not on file  Social History Narrative   She is widowed, has two daughters. [place]; quit smoking- in 1990s; social alcohol.    Social Drivers of Corporate investment banker Strain: Low Risk  (03/09/2024)   Overall Financial Resource Strain (CARDIA)    Difficulty of Paying Living Expenses: Not hard at all  Food Insecurity: No Food Insecurity (03/09/2024)   Hunger Vital Sign    Worried About Running Out of Food in the Last Year: Never true    Ran Out of Food in the Last Year: Never true  Transportation Needs: No Transportation Needs (03/09/2024)   PRAPARE - Administrator, Civil Service (Medical): No    Lack of Transportation (Non-Medical): No  Physical Activity: Insufficiently Active (03/09/2024)   Exercise Vital Sign    Days of Exercise per Week: 3 days    Minutes of Exercise per Session: 20 min  Stress: No Stress Concern Present (03/09/2024)   [employer] of Occupational Health - Occupational Stress Questionnaire    Feeling of Stress: Not at all  Social Connections: Moderately Integrated (03/09/2024)   Social Connection and Isolation Panel    Frequency of Communication with Friends and Family: More than three times a week     Frequency of Social Gatherings with Friends and Family: Three times a week    Attends Religious Services: More than 4 times per year    Active Member of Clubs or Organizations: Yes    Attends Banker Meetings: More than 4 times per year    Marital Status: Widowed     Review of Systems  Constitutional:  Negative for appetite change and unexpected weight change.  HENT:  Positive for congestion, postnasal drip and rhinorrhea.   Respiratory:  Positive for cough. Negative for chest tightness.        Breathing stable.   Cardiovascular:  Negative for chest pain and palpitations.       No increased swelling.   Gastrointestinal:  Negative for abdominal pain, nausea and vomiting.  Genitourinary:  Negative for difficulty urinating and dysuria.  Musculoskeletal:  Negative for joint swelling and myalgias.  Skin:  Negative for color change and wound.  Neurological:  Negative for dizziness and headaches.  Psychiatric/Behavioral:  Negative for agitation and dysphoric mood.        Objective:     BP 118/64   Pulse 80   Ht 5' 4 (1.626 m)   Wt 144 lb 12.8 oz (65.7 kg)   LMP 08/15/1978   SpO2 96%   BMI 24.85 kg/m  Wt Readings from Last 3 Encounters:  03/13/24 144 lb 12.8 oz (65.7 kg)  12/20/23 148 lb 9.6 oz (67.4 kg)  10/11/23 148 lb (67.1 kg)    Physical Exam Vitals reviewed.  Constitutional:      General: She is not in acute distress.    Appearance: Normal appearance.  HENT:     Head: Normocephalic and atraumatic.     Right Ear: External ear normal.     Left Ear: External ear normal.  Mouth/Throat:     Pharynx: No oropharyngeal exudate or posterior oropharyngeal erythema.  Eyes:     General: No scleral icterus.       Right eye: No discharge.        Left eye: No discharge.     Conjunctiva/sclera: Conjunctivae normal.  Neck:     Thyroid : No thyromegaly.  Cardiovascular:     Rate and Rhythm: Normal rate and regular rhythm.  Pulmonary:     Effort: No  respiratory distress.     Breath sounds: Normal breath sounds. No wheezing.  Abdominal:     General: Bowel sounds are normal.     Palpations: Abdomen is soft.     Tenderness: There is no abdominal tenderness.  Musculoskeletal:        General: No swelling or tenderness.     Cervical back: Neck supple. No tenderness.  Lymphadenopathy:     Cervical: No cervical adenopathy.  Skin:    Findings: No erythema or rash.  Neurological:     Mental Status: She is alert.  Psychiatric:        Mood and Affect: Mood normal.        Behavior: Behavior normal.         Outpatient Encounter Medications as of 03/13/2024  Medication Sig   azelastine  (ASTELIN ) 0.1 % nasal spray Place 1 spray into both nostrils 2 (two) times daily. Use in each nostril as directed   cholecalciferol (VITAMIN D3) 25 MCG (1000 UNIT) tablet Take 2,000 Units by mouth daily.   cyanocobalamin  1000 MCG tablet Take 1,000 mcg by mouth every other day.   ELIQUIS 2.5 MG TABS tablet Take 2.5 mg by mouth 2 (two) times daily.   finasteride  (PROSCAR ) 5 MG tablet TAKE 1 TABLET DAILY   fluticasone  (FLONASE ) 50 MCG/ACT nasal spray Place 1 spray into both nostrils daily as needed for allergies or rhinitis.   latanoprost (XALATAN) 0.005 % ophthalmic solution Place 1 drop into both eyes at bedtime.    Lifitegrast (XIIDRA) 5 % SOLN Place 1 drop into both eyes daily.   losartan  (COZAAR ) 25 MG tablet Take 25 mg by mouth at bedtime.   metoprolol succinate (TOPROL-XL) 25 MG 24 hr tablet Take 25 mg by mouth daily.   minoxidil  (LONITEN ) 2.5 MG tablet TAKE 1 TABLET DAILY   ondansetron  (ZOFRAN ) 4 MG tablet Take 1 tablet (4 mg total) by mouth every 8 (eight) hours as needed for nausea or vomiting.   pantoprazole  (PROTONIX ) 20 MG tablet TAKE 1 TABLET EVERY OTHER DAY   rosuvastatin  (CRESTOR ) 10 MG tablet TAKE 1 TABLET DAILY   tolterodine  (DETROL  LA) 4 MG 24 hr capsule TAKE 1 CAPSULE DAILY   [DISCONTINUED] ibuprofen  (ADVIL ) 800 MG tablet Take 1 tablet  (800 mg total) by mouth every 8 (eight) hours as needed.   Facility-Administered Encounter Medications as of 03/13/2024  Medication   Tralokinumab -ldrm SOSY 300 mg     Lab Results  Component Value Date   WBC 6.1 12/21/2023   HGB 10.8 (L) 12/21/2023   HCT 33.0 (L) 12/21/2023   PLT 274 12/21/2023   GLUCOSE 93 03/13/2024   CHOL 170 03/13/2024   TRIG 66.0 03/13/2024   HDL 101.20 03/13/2024   LDLDIRECT 145.0 01/06/2021   LDLCALC 56 03/13/2024   ALT 8 03/13/2024   AST 15 03/13/2024   NA 137 03/13/2024   K 4.2 03/13/2024   CL 104 03/13/2024   CREATININE 1.19 03/13/2024   BUN 19 03/13/2024   CO2 25 03/13/2024  TSH 1.67 09/13/2023   INR 1.0 10/16/2013   HGBA1C 5.5 07/18/2019        Assessment & Plan:  Thoracic aortic aneurysm without rupture, unspecified part [facility]) Assessment & Plan: Sees cardiology.    Stage 3b chronic kidney disease (HCC) Assessment & Plan: Avoid nephrotoxins.  On losartan . Follow pressures.  Follow metabolic panel. Continue f/u with nephrology.   Orders: -     Basic metabolic panel with GFR  Hypercholesterolemia Assessment & Plan: Continue crestor . Low cholesterol diet and exercise. Follow lipid panel and liver function tests. Check lipid panel today.   Orders: -     Hepatic function panel -     Lipid panel  Primary hypertension Assessment & Plan: Continue on losartan  and metoprolol.  Blood pressure doing well.  Continue same medication regimen. Follow pressures. Check metabolic panel today.    Atrial fibrillation, unspecified type [facility]) Assessment & Plan: S/p ablation in September. On metoprolol. Continues on eliquis. Stable.    Pain of upper abdomen Assessment & Plan: Had f/u with Dr Cesar 11/02/23. Doing well from surgery. Discussed.  Keep bowels moving. Has scheduled follow up with GI. Monitor for triggers.    B12 deficiency Assessment & Plan: Continue B12 supplements every other day.    Cough, unspecified type Assessment &  Plan: Appears to be associated with increased drainage. Continue steroid nasal spray. Add astelin . If persistent cough, discussed f/u pulmonary - consider PFTs, etc.    Gastroesophageal reflux disease, unspecified whether esophagitis present Assessment & Plan: Continues on protonix . Reports acid relfux symptoms controlled.    History of colonic polyps Assessment & Plan: Colonoscopy September 2020-1 tubular adenomatous polyp (hepatic flexure), diverticulosis and internal hemorrhoids.   Symptomatic anemia Assessment & Plan: Being followed by Dr Rennie.  IV venofer  prn.  Follow cbc. Continue f/u with hematology.    Other nonthrombocytopenic purpura (HCC) Assessment & Plan: Afib. On eliquis.    Other orders -     Azelastine  HCl; Place 1 spray into both nostrils 2 (two) times daily. Use in each nostril as directed  Dispense: 30 mL; Refill: 0     Allena Hamilton, MD

## 2024-03-13 NOTE — Patient Instructions (Addendum)
 Continue nasacort  nasal spray (or flonase ) - 2 sprays each nostril one time per day.   Astelin  nasal spray - 1 spray each nostril twice a day.   Delsym cough syrup as twice a day as needed for cough

## 2024-03-14 ENCOUNTER — Ambulatory Visit: Payer: Self-pay | Admitting: Internal Medicine

## 2024-03-19 ENCOUNTER — Encounter: Payer: Self-pay | Admitting: Internal Medicine

## 2024-03-19 NOTE — Assessment & Plan Note (Signed)
 Appears to be associated with increased drainage. Continue steroid nasal spray. Add astelin . If persistent cough, discussed f/u pulmonary - consider PFTs, etc.

## 2024-03-19 NOTE — Assessment & Plan Note (Signed)
 Continue B12 supplements every other day.

## 2024-03-19 NOTE — Assessment & Plan Note (Signed)
Afib.  On eliquis.  

## 2024-03-19 NOTE — Assessment & Plan Note (Signed)
Colonoscopy September 2020-1 tubular adenomatous polyp (hepatic flexure), diverticulosis and internal hemorrhoids. 

## 2024-03-19 NOTE — Assessment & Plan Note (Signed)
 Continue on losartan  and metoprolol.  Blood pressure doing well.  Continue same medication regimen. Follow pressures. Check metabolic panel today.

## 2024-03-19 NOTE — Assessment & Plan Note (Signed)
 Continues on protonix . Reports acid relfux symptoms controlled.

## 2024-03-19 NOTE — Assessment & Plan Note (Addendum)
 S/p ablation in September. On metoprolol. Continues on eliquis. Stable.

## 2024-03-19 NOTE — Assessment & Plan Note (Signed)
 Continue crestor. Low cholesterol diet and exercise.  Follow lipid panel and liver function tests.  Check lipid panel today.

## 2024-03-19 NOTE — Assessment & Plan Note (Signed)
 Being followed by Dr Rennie.  IV venofer  prn.  Follow cbc. Continue f/u with hematology.

## 2024-03-19 NOTE — Assessment & Plan Note (Signed)
 Sees cardiology.

## 2024-03-19 NOTE — Assessment & Plan Note (Signed)
 Avoid nephrotoxins.  On losartan. Follow pressures.  Follow metabolic panel.   Continue f/u with nephrology.

## 2024-03-19 NOTE — Assessment & Plan Note (Signed)
 Had f/u with Dr Cesar 11/02/23. Doing well from surgery. Discussed.  Keep bowels moving. Has scheduled follow up with GI. Monitor for triggers.

## 2024-03-22 ENCOUNTER — Inpatient Hospital Stay (HOSPITAL_BASED_OUTPATIENT_CLINIC_OR_DEPARTMENT_OTHER): Admitting: Internal Medicine

## 2024-03-22 ENCOUNTER — Inpatient Hospital Stay: Attending: Internal Medicine

## 2024-03-22 ENCOUNTER — Inpatient Hospital Stay

## 2024-03-22 ENCOUNTER — Encounter: Payer: Self-pay | Admitting: Internal Medicine

## 2024-03-22 VITALS — BP 109/49 | HR 70

## 2024-03-22 DIAGNOSIS — D509 Iron deficiency anemia, unspecified: Secondary | ICD-10-CM | POA: Insufficient documentation

## 2024-03-22 DIAGNOSIS — D649 Anemia, unspecified: Secondary | ICD-10-CM | POA: Diagnosis not present

## 2024-03-22 DIAGNOSIS — I129 Hypertensive chronic kidney disease with stage 1 through stage 4 chronic kidney disease, or unspecified chronic kidney disease: Secondary | ICD-10-CM | POA: Insufficient documentation

## 2024-03-22 DIAGNOSIS — E611 Iron deficiency: Secondary | ICD-10-CM

## 2024-03-22 DIAGNOSIS — N183 Chronic kidney disease, stage 3 unspecified: Secondary | ICD-10-CM | POA: Insufficient documentation

## 2024-03-22 LAB — CBC WITH DIFFERENTIAL (CANCER CENTER ONLY)
Abs Immature Granulocytes: 0.03 K/uL (ref 0.00–0.07)
Basophils Absolute: 0.1 K/uL (ref 0.0–0.1)
Basophils Relative: 1 %
Eosinophils Absolute: 0.2 K/uL (ref 0.0–0.5)
Eosinophils Relative: 2 %
HCT: 34 % — ABNORMAL LOW (ref 36.0–46.0)
Hemoglobin: 11.3 g/dL — ABNORMAL LOW (ref 12.0–15.0)
Immature Granulocytes: 1 %
Lymphocytes Relative: 38 %
Lymphs Abs: 2.5 K/uL (ref 0.7–4.0)
MCH: 33.8 pg (ref 26.0–34.0)
MCHC: 33.2 g/dL (ref 30.0–36.0)
MCV: 101.8 fL — ABNORMAL HIGH (ref 80.0–100.0)
Monocytes Absolute: 0.5 K/uL (ref 0.1–1.0)
Monocytes Relative: 8 %
Neutro Abs: 3.4 K/uL (ref 1.7–7.7)
Neutrophils Relative %: 50 %
Platelet Count: 318 K/uL (ref 150–400)
RBC: 3.34 MIL/uL — ABNORMAL LOW (ref 3.87–5.11)
RDW: 12.9 % (ref 11.5–15.5)
WBC Count: 6.6 K/uL (ref 4.0–10.5)
nRBC: 0 % (ref 0.0–0.2)

## 2024-03-22 LAB — BASIC METABOLIC PANEL - CANCER CENTER ONLY
Anion gap: 10 (ref 5–15)
BUN: 20 mg/dL (ref 8–23)
CO2: 22 mmol/L (ref 22–32)
Calcium: 9.4 mg/dL (ref 8.9–10.3)
Chloride: 102 mmol/L (ref 98–111)
Creatinine: 1.32 mg/dL — ABNORMAL HIGH (ref 0.44–1.00)
GFR, Estimated: 40 mL/min — ABNORMAL LOW (ref >60)
Glucose, Bld: 128 mg/dL — ABNORMAL HIGH (ref 70–99)
Potassium: 4.4 mmol/L (ref 3.5–5.1)
Sodium: 134 mmol/L — ABNORMAL LOW (ref 135–145)

## 2024-03-22 LAB — IRON AND TIBC
Iron: 84 ug/dL (ref 28–170)
Saturation Ratios: 26 % (ref 10.4–31.8)
TIBC: 318 ug/dL (ref 250–450)
UIBC: 234 ug/dL

## 2024-03-22 LAB — FERRITIN: Ferritin: 108 ng/mL (ref 11–307)

## 2024-03-22 LAB — LACTATE DEHYDROGENASE: LDH: 169 U/L (ref 98–192)

## 2024-03-22 LAB — VITAMIN B12: Vitamin B-12: 582 pg/mL (ref 180–914)

## 2024-03-22 MED ORDER — SODIUM CHLORIDE 0.9% FLUSH
10.0000 mL | Freq: Once | INTRAVENOUS | Status: AC | PRN
  Administered 2024-03-22: 10 mL
  Filled 2024-03-22: qty 10

## 2024-03-22 MED ORDER — IRON SUCROSE 20 MG/ML IV SOLN
200.0000 mg | Freq: Once | INTRAVENOUS | Status: AC
  Administered 2024-03-22: 200 mg via INTRAVENOUS
  Filled 2024-03-22: qty 10

## 2024-03-22 NOTE — Progress Notes (Signed)
 Patient declined to wait the 30 minutes for post iron infusion observation today. Tolerated infusion well. VSS.

## 2024-03-22 NOTE — Patient Instructions (Signed)
#  Recommend HOLDING- Losartan ; # CALL PCP/cardiology re: medications.   # And increase fluid intake at home/check BP at home.

## 2024-03-22 NOTE — Assessment & Plan Note (Addendum)
#  Anemia-iron  deficiency; - nadir July 2022- Hemoglobin improved from 7 to 12.  INTOLERANCE PO gentle iron / constipation [needing enema].  Proceed with iron  infusion today-  stable.  # Macrocytosis- ? unclear etiology- B12 JAN 2024- > 1000; recommend qOD on b12 pills;  / CKD vs others.  Discussed re: bone marrow dysfunction, but HOLD off  bone marrow biopsy at his time given response to IV iron .  Etiology of iron  deficiency: [Oct 2022] s/p [facility] evaluation; s/p  barium esophagogram.  #A. fib [Dr.Thomas; [facility] ]-on Eliquis 2.5 mg twice daily- s/p ablation [[facility]-Oct 2022]; Hypotension- ? Dehydration- Recommend HOLDING- Losartan ; and call PCP/cardiology re: medications. And increase fluid intake at home/check BP at home.    # CKD- Stage III- GFR 46- stable. [Dr.[email or web address]]  # DISPOSITION: # venofer  today;  # follow up in 3 month-  MD; labs- cbc/bmp/iron  studies/ferritin; LDH;B12 level- Possible venofer -Dr.B

## 2024-03-22 NOTE — Progress Notes (Signed)
 [facility] CONSULT NOTE  Patient Care Team: Glendia Shad, MD as PCP - General (Internal Medicine) Aundria Ladell POUR, MD as Consulting Physician (Gastroenterology) Rennie Cindy SAUNDERS, MD as Consulting Physician (Internal Medicine)  CHIEF COMPLAINTS/PURPOSE OF CONSULTATION: ANEMIA  HEMATOLOGY HISTORY:  # AUG 2022:  SEVERE ANEMIA [hb-7; ]  EGD/Colonoscopy:2020- last colo-39mm [facility]; Dr.Toledo]; Esophagogram- 2022- NEG for stricture. ? capsule  # CKD stage III [Dr.Korrapti]; A.fib [on eliquis on 2.5 mg BID;  Dr.Kowalski]   HISTORY OF PRESENTING ILLNESS: Alone. Ambulating independently.  Leah Huber 83 y.o.  female iron  deficient/macrocytic anemia of unclear etiology- / CKD-III; A-fib on Eliquis is here for follow-up.   Pt has some dyspnea and mild dizziness.  No falls.  Patient denies any increase in the dose of blood pressure medications or diuretics.  Eats 2 meals per day, lighter meals. No visible blood in stools. Has occ low abd pain since hernia repair. Not any worse.   Review of Systems  Constitutional:  Positive for malaise/fatigue. Negative for chills, diaphoresis, fever and weight loss.  HENT:  Negative for nosebleeds and sore throat.   Eyes:  Negative for double vision.  Respiratory:  Negative for cough, hemoptysis, sputum production and wheezing.   Cardiovascular:  Negative for chest pain, palpitations, orthopnea and leg swelling.  Gastrointestinal:  Positive for constipation. Negative for blood in stool, heartburn, melena and vomiting.  Genitourinary:  Negative for dysuria, frequency and urgency.  Musculoskeletal:  Positive for back pain and joint pain.  Skin: Negative.  Negative for itching and rash.  Neurological:  Negative for tingling, focal weakness, weakness and headaches.  Endo/Heme/Allergies:  Does not bruise/bleed easily.  Psychiatric/Behavioral:  Negative for depression. The patient is not nervous/anxious and does not have insomnia.      MEDICAL HISTORY:  Past Medical History:  Diagnosis Date   A-fib [facility])    a.) CHA2DS2-VASc = 5 (age x2, sex, HTN, vascular disease) as of 10/08/2023; b.) scheduled for DCCV 10/29/220 - arrived in SR - procedure cancelled; c.) s/p WACA/PVI ablation 05/21/2021; d.) cardiac rate/rhythm maintained on oral metoprolol succinate; chronically anticoagulated using apixaban   Adenomatous colon polyp    Aortic atherosclerosis (HCC)    Arthritis    Ascending aorta dilation (HCC)    a.) TTE 03/06/2016: asc Ao 40 mm, Ao root 32 mm; b.) TTE 10/13/2018: Ao root 32 mm; c.) TTE 02/04/2023: Ao root 28 mm, asc Ao 39 mm   Bilateral carotid artery disease (HCC)    Bilateral carpal tunnel syndrome 12/08/2017   Bilateral inguinal hernia    Cervical spondylosis with radiculopathy    a.) s/p ACDF C5-C6 2016   Chronic cough    @ NIGHT/ SEASONAL ALLERGIES   CKD (chronic kidney disease), stage III (HCC)    Clostridium difficile infection 2013-2014   Concussion    Coronary artery calcification seen on CT scan    Depression    Diastolic dysfunction 03/06/2016   a.) TTE 03/06/16: EF >55%, G1DD, RVSP 46.5, mild MR, mod TR, asc Ao 40 mm, Ao root 32 mm; b.) TTE 10/13/18: EF >55%, mild LVH, RVSP 35.1, mild MR/TR/PR, Ao root 32 mm; c.) TTE 08/19/20: EF >55%, mild RVE, mild BAE, G1DD, RVSP 42.4, triv AR/PR, mild MR, mod TR; d.) TTE 02/04/2023: EF >55%, mild LAE, RVSP 54.8, PFO (prior transeptal puncture) with L-R IAS, mild MR, mod TR, Ao root 28 mm, asc Ao 39 mm   Diverticulosis    GERD (gastroesophageal reflux disease)    Glaucoma  Gout    Hair loss    a.) on minoxidil    Hemorrhoids    History of bilateral cataract extraction    History of chicken pox    History of colonic polyps    History of shingles    Hypercholesterolemia    Hypertension    IBS (irritable bowel syndrome)    IDA (iron  deficiency anemia)    On reduced dose apixaban therapy    Osteoarthritis    Osteopenia    Palpitations    Paresthesia  of both hands    PFO (patent foramen ovale)    a.) TTE 02/04/2023: PFO (prior transeptal puncture) with L-R IAS   Pneumonia    Pulmonary HTN (HCC) 03/06/2016   a.) TTE 03/06/2016: RVSP 46.5; b.) TTE 10/13/2018: RVSP 35.1; c.) TTE 08/19/2020: RVSP 42.4; d.) TTE 02/04/2023: RVSP 54.8   Right leg paresthesias (lower leg and foot)    a.) following spinal surgery   Secondary hyperparathyroidism of renal origin (HCC)    Sleep apnea    a.) mild; does not require nocturnal PAP therapy   SOB (shortness of breath)    Urinary incontinence     SURGICAL HISTORY: Past Surgical History:  Procedure Laterality Date   ABDOMINAL HYSTERECTOMY  1979   ANTERIOR CERVICAL DECOMP/DISCECTOMY FUSION N/A 11/28/2014   Procedure: CERVICAL FIVE-SIX  ANTERIOR CERVICAL DECOMPRESSION/DISCECTOMY FUSION 1 LEVEL;  Surgeon: Reyes Budge, MD;  Location: [facility];  Service: Neurosurgery;  Laterality: N/A;  C56 anterior cervical decompression with fusion interbody prosthesis plating and bonegraft   BACK SURGERY     X2/ L4,5 & S1/ Cervical C7?   BLADDER SURGERY  1981   SUSPENSION   BREAST REDUCTION SURGERY     CARDIOVERSION N/A 10/29/2020   Procedure: CARDIOVERSION;  Surgeon: Hester Wolm PARAS, MD;  Location: [facility] ORS;  Service: Cardiovascular;  Laterality: N/A; CANCELLED AS PATIENT WAS IN SINUS RHYTHM   CARPAL TUNNEL RELEASE  2007   CATARACT EXTRACTION W/PHACO Right 12/30/2016   Procedure: CATARACT EXTRACTION PHACO AND INTRAOCULAR LENS PLACEMENT (IOC)  Right toric lens;  Surgeon: Dene Etienne, MD;  Location[facility];  Service: Ophthalmology;  Laterality: Right   CATARACT EXTRACTION W/PHACO Left 01/20/2017   Procedure: CATARACT EXTRACTION PHACO AND INTRAOCULAR LENS PLACEMENT (IOC)  Left Toric;  Surgeon: Etienne Dene, MD;  Location: [facility];  Service: Ophthalmology;  Laterality: Left;  toric lens   CHOLECYSTECTOMY  1996   COLONOSCOPY     COLONOSCOPY WITH PROPOFOL  N/A 05/22/2019    Procedure: COLONOSCOPY WITH PROPOFOL ;  Surgeon: Toledo, Ladell POUR, MD;  Location: [facility] ENDOSCOPY;  Service: Gastroenterology;  Laterality: N/A;   COSMETIC SURGERY     EYE SURGERY     FOOT SURGERY  2005   JOINT REPLACEMENT     LAMINECTOMY  1983 & 1985   REDUCTION MAMMAPLASTY Bilateral 2001   ROTATOR CUFF REPAIR  2000 & 2004   left and right   SPINAL CORD STIMULATOR IMPLANT  03/1997   SPINAL CORD STIMULATOR REMOVAL  2007   SPINE SURGERY     Tendon repair right shoulder     TONSILLECTOMY     TOTAL KNEE ARTHROPLASTY  10/2011 & 09/13   left and right    SOCIAL HISTORY: Social History   Socioeconomic History   Marital status: Widowed    Spouse name: Not on file   Number of children: 2   Years of education: Not on file   Highest education level: 12th grade  Occupational History  Not on file  Tobacco Use   Smoking status: Former    Current packs/day: 0.00    Average packs/day: 1 pack/day for 20.0 years (20.0 ttl pk-yrs)    Types: Cigarettes    Start date: 09/07/1968    Quit date: 09/07/1988    Years since quitting: 35.5   Smokeless tobacco: Never  Vaping Use   Vaping status: Never Used  Substance and Sexual Activity   Alcohol use: Yes    Alcohol/week: 2.0 standard drinks of alcohol    Types: 2 Glasses of wine per week    Comment: occasional   Drug use: No   Sexual activity: Never  Other Topics Concern   Not on file  Social History Narrative   She is widowed, has two daughters. [place]; quit smoking- in 1990s; social alcohol.    Social Drivers of Corporate investment banker Strain: Low Risk  (03/09/2024)   Overall Financial Resource Strain (CARDIA)    Difficulty of Paying Living Expenses: Not hard at all  Food Insecurity: No Food Insecurity (03/09/2024)   Hunger Vital Sign    Worried About Running Out of Food in the Last Year: Never true    Ran Out of Food in the Last Year: Never true  Transportation Needs: No Transportation Needs (03/09/2024)   PRAPARE - Therapist, art (Medical): No    Lack of Transportation (Non-Medical): No  Physical Activity: Insufficiently Active (03/09/2024)   Exercise Vital Sign    Days of Exercise per Week: 3 days    Minutes of Exercise per Session: 20 min  Stress: No Stress Concern Present (03/09/2024)   [employer] of Occupational Health - Occupational Stress Questionnaire    Feeling of Stress: Not at all  Social Connections: Moderately Integrated (03/09/2024)   Social Connection and Isolation Panel    Frequency of Communication with Friends and Family: More than three times a week    Frequency of Social Gatherings with Friends and Family: Three times a week    Attends Religious Services: More than 4 times per year    Active Member of Clubs or Organizations: Yes    Attends Banker Meetings: More than 4 times per year    Marital Status: Widowed  Intimate Partner Violence: Not At Risk (10/06/2023)   Humiliation, Afraid, Rape, and Kick questionnaire    Fear of Current or Ex-Partner: No    Emotionally Abused: No    Physically Abused: No    Sexually Abused: No    FAMILY HISTORY: Family History  Problem Relation Age of Onset   Heart disease Mother    Stroke Mother    Hypertension Mother    Hyperlipidemia Father    Heart disease Father    Heart disease Brother        2 brothers   Alzheimer's disease Brother    Heart disease Sister        pacemaker   Breast cancer Daughter 44   Hyperlipidemia Brother     ALLERGIES:  is allergic to clindamycin/lincomycin, dronedarone, bactrim [sulfamethoxazole-trimethoprim], morphine , morphine  and codeine, and sulfa antibiotics.  MEDICATIONS:  Current Outpatient Medications  Medication Sig Dispense Refill   azelastine  (ASTELIN ) 0.1 % nasal spray Place 1 spray into both nostrils 2 (two) times daily. Use in each nostril as directed 30 mL 0   cholecalciferol (VITAMIN D3) 25 MCG (1000 UNIT) tablet Take 2,000 Units by mouth daily.     cyanocobalamin   1000 MCG tablet Take 1,000 mcg  by mouth every other day.     ELIQUIS 2.5 MG TABS tablet Take 2.5 mg by mouth 2 (two) times daily.     finasteride  (PROSCAR ) 5 MG tablet TAKE 1 TABLET DAILY 90 tablet 3   fluticasone  (FLONASE ) 50 MCG/ACT nasal spray Place 1 spray into both nostrils daily as needed for allergies or rhinitis.     latanoprost (XALATAN) 0.005 % ophthalmic solution Place 1 drop into both eyes at bedtime.      Lifitegrast (XIIDRA) 5 % SOLN Place 1 drop into both eyes daily.     losartan  (COZAAR ) 25 MG tablet Take 25 mg by mouth at bedtime.     metoprolol succinate (TOPROL-XL) 25 MG 24 hr tablet Take 25 mg by mouth daily.     minoxidil  (LONITEN ) 2.5 MG tablet TAKE 1 TABLET DAILY 90 tablet 3   pantoprazole  (PROTONIX ) 20 MG tablet TAKE 1 TABLET EVERY OTHER DAY 45 tablet 3   rosuvastatin  (CRESTOR ) 10 MG tablet TAKE 1 TABLET DAILY 90 tablet 3   tolterodine  (DETROL  LA) 4 MG 24 hr capsule TAKE 1 CAPSULE DAILY 90 capsule 1   Current Facility-Administered Medications  Medication Dose Route Frequency Provider Last Rate Last Admin   Tralokinumab -ldrm SOSY 300 mg  300 mg Subcutaneous Once Jackquline Sawyer, MD          PHYSICAL EXAMINATION:   Vitals:   03/22/24 1253 03/22/24 1258  BP:  (!) 89/50  Pulse: 75   Resp: 16   Temp: (!) 97.4 F (36.3 C)   SpO2: 99%     Filed Weights   03/22/24 1253  Weight: 145 lb (65.8 kg)      Physical Exam Vitals and nursing note reviewed.  HENT:     Head: Normocephalic and atraumatic.     Mouth/Throat:     Pharynx: Oropharynx is clear.  Eyes:     Extraocular Movements: Extraocular movements intact.     Pupils: Pupils are equal, round, and reactive to light.  Cardiovascular:     Rate and Rhythm: Normal rate. Rhythm irregular.  Pulmonary:     Comments: Decreased breath sounds bilaterally.  Abdominal:     Palpations: Abdomen is soft.  Musculoskeletal:        General: Normal range of motion.     Cervical back: Normal range of motion.   Skin:    General: Skin is warm.  Neurological:     General: No focal deficit present.     Mental Status: She is alert and oriented to person, place, and time.  Psychiatric:        Behavior: Behavior normal.        Judgment: Judgment normal.    LABORATORY DATA:  I have reviewed the data as listed Lab Results  Component Value Date   WBC 6.6 03/22/2024   HGB 11.3 (L) 03/22/2024   HCT 34.0 (L) 03/22/2024   MCV 101.8 (H) 03/22/2024   PLT 318 03/22/2024   Recent Labs    09/13/23 1505 09/22/23 0928 12/20/23 1542 12/21/23 1249 03/13/24 1000 03/22/24 1231  NA 137 136 138 134* 137 134*  K 4.3 4.3 4.3 4.4 4.2 4.4  CL 103 105 105 102 104 102  CO2 24 22 24  20* 25 22  GLUCOSE 91 96 93 94 93 128*  BUN 29* 30* 20 21 19 20   CREATININE 1.20 1.08* 1.25* 1.25* 1.19 1.32*  CALCIUM  9.6 9.5 9.5 9.1 9.7 9.4  GFRNONAA  --  51*  --  43*  --  40*  PROT 7.0  --  7.0  --  7.2  --   ALBUMIN 4.6  --  4.7  --  4.4  --   AST 25  --  20  --  15  --   ALT 15  --  11  --  8  --   ALKPHOS 66  --  57  --  53  --   BILITOT 0.5  --  0.6  --  0.9  --   BILIDIR 0.1  --  0.1  --  0.2  --      No results found.  Symptomatic anemia #Anemia-iron  deficiency; - nadir July 2022- Hemoglobin improved from 7 to 12.  INTOLERANCE PO gentle iron / constipation [needing enema].  Proceed with iron  infusion today-  stable.  # Macrocytosis- ? unclear etiology- B12 JAN 2024- > 1000; recommend qOD on b12 pills;  / CKD vs others.  Discussed re: bone marrow dysfunction, but HOLD off  bone marrow biopsy at his time given response to IV iron .  Etiology of iron  deficiency: [Oct 2022] s/p [facility] evaluation; s/p  barium esophagogram.  #A. fib [Dr.Thomas; [facility] ]-on Eliquis 2.5 mg twice daily- s/p ablation [[facility]-Oct 2022]; Hypotension- ? Dehydration- Recommend HOLDING- Losartan ; and call PCP/cardiology re: medications. And increase fluid intake at home/check BP at home.    # CKD- Stage III- GFR 46- stable. [Dr.korrapati]  #  DISPOSITION: # venofer  today;  # follow up in 3 month-  MD; labs- cbc/bmp/iron  studies/ferritin; LDH;B12 level- Possible venofer -Dr.B      All questions were answered. The patient knows to call the clinic with any problems, questions or concerns.    Cindy JONELLE Joe, MD 03/22/2024 1:17 PM

## 2024-03-22 NOTE — Addendum Note (Signed)
 Addended by: JOSHUA ALFONSO CROME on: 03/22/2024 01:19 PM   Modules accepted: Orders

## 2024-03-22 NOTE — Progress Notes (Signed)
 Pt has some dyspnea and mild dizziness. Could be from A Fib or her Anemia.  Eats 2 meals per day, lighter meals. No visible blood in stools. Has occ low abd pain since hernia repair.

## 2024-03-23 ENCOUNTER — Telehealth: Payer: Self-pay

## 2024-03-23 NOTE — Telephone Encounter (Signed)
 Copied from CRM [id number]. Topic: Clinical - Medical Advice >> Mar 23, 2024 10:56 AM Harlene ORN wrote: Reason for CRM: Patient had her iron  infusion yesterday. Her blood pressure was 89 over 50 before her infusion. Was instructed to not take her Losarten last night. Wanted to follow up with her PCP before leaving for her family reunion tomorrow.   Please call back the patient to discuss.

## 2024-03-23 NOTE — Telephone Encounter (Signed)
 LM for patient. Need to know how she is feeling today and what her BP is today.

## 2024-03-24 ENCOUNTER — Other Ambulatory Visit: Payer: Self-pay | Admitting: Nurse Practitioner

## 2024-03-24 NOTE — Telephone Encounter (Signed)
 Patient is aware. She is going to try to see if her daughter can check her blood pressure.

## 2024-03-24 NOTE — Telephone Encounter (Unsigned)
 Copied from CRM [id number]. Topic: Clinical - Medical Advice >> Mar 23, 2024  4:59 PM Melissa C wrote: Reason for CRM: patient was calling Harlene back- she stated that she hasn't taken blood pressure today (her blood pressure cuff isn't working correctly) but stated she is feeling fine. She stated if you would like to talk to her before she leaves on her trip she is leaving at 11am on Friday.

## 2024-03-24 NOTE — Telephone Encounter (Signed)
 Patient says she is having no acute symptoms and feels good but has no way to check her BP. She has held her losartan  the last 2 nights and wants to know if she can restart now that she has had her infusion and feeling better. She is leaving at 53 AM to go out of town but was advised to take her medication with her so she will have it if she needs to restart.

## 2024-03-24 NOTE — Progress Notes (Unsigned)
 Leah Huber

## 2024-03-31 DIAGNOSIS — H43813 Vitreous degeneration, bilateral: Secondary | ICD-10-CM | POA: Diagnosis not present

## 2024-03-31 DIAGNOSIS — H401131 Primary open-angle glaucoma, bilateral, mild stage: Secondary | ICD-10-CM | POA: Diagnosis not present

## 2024-03-31 DIAGNOSIS — H04123 Dry eye syndrome of bilateral lacrimal glands: Secondary | ICD-10-CM | POA: Diagnosis not present

## 2024-03-31 DIAGNOSIS — Z961 Presence of intraocular lens: Secondary | ICD-10-CM | POA: Diagnosis not present

## 2024-04-03 ENCOUNTER — Ambulatory Visit: Payer: Self-pay

## 2024-04-03 NOTE — Telephone Encounter (Signed)
 FYI Only or Action Required?: Action required by provider: clinical question for provider.  Patient was last seen in primary care on 03/13/2024 by Glendia Shad, MD.  Called Nurse Triage reporting Hypotension.  Symptoms began today.  Interventions attempted: Nothing.  Symptoms are: stable.  Triage Disposition: See Physician Within 24 Hours  Patient/caregiver understands and will follow disposition?: No, wishes to speak with PCP  **See note below**         Copied from CRM #[id number]. Topic: Clinical - Red Word Triage >> Apr 03, 2024  1:08 PM Shereese L wrote: [facility] that prompted transfer to Nurse Triage: Rosaline her daughter called and stated that her pressure was  98/56 and a Pulse of 83. She's very tired and dosage may be very high  And may skip todays dosage but need clarity from nurse/doctor Reason for Disposition  [1] Systolic BP 90-110 AND [2] taking blood pressure medications AND [3] NOT feeling weak or lightheaded  Answer Assessment - Initial Assessment Questions 2. ONSET: When did you take your blood pressure?      98/56 Pulse of 83   3. HOW: How did you take your blood pressure? (e.g., visiting nurse, automatic home BP monitor)      Home B/P cuff  4. HISTORY: Do you have a history of low blood pressure? What is your blood pressure normally?     No   5. MEDICINES: Are you taking any medicines for blood pressure? If Yes, ask: Have they been changed recently?     Yes  6. PULSE RATE: Do you know what your pulse rate is?      88  7. OTHER SYMPTOMS: Have you been sick recently? Have you had a recent injury? No  Daughter would like to know if patient needs a medication adjustment as the dosages may be strong. Patient takes B/P medication in the evening, and over the past day her pressures have been lowering. Patient is asymptomatic.  Protocols used: Blood Pressure - Low-A-AH

## 2024-04-03 NOTE — Telephone Encounter (Signed)
 Confirm on losartan  25mg  q day. If so, have her hold losartan . Monitor blood pressures. Schedule f/u appt to reassess blood pressures. Per note, is asymptomatic. Confirm no dizziness, light headedness, sob, chest pain, etc. If any acute symptoms, needs to be evaluated mote acutely.

## 2024-04-03 NOTE — Telephone Encounter (Signed)
 Losartan  is 25 mg q day. Will hold. No acute symptoms. Will monitor pressures. Scheduled for Friday,.

## 2024-04-03 NOTE — Telephone Encounter (Signed)
 BP 98/56. Patient is asymptomatic. Do you want her to hold her losartan , monitor BP and see her Friday at 3p?

## 2024-04-07 ENCOUNTER — Ambulatory Visit (INDEPENDENT_AMBULATORY_CARE_PROVIDER_SITE_OTHER): Admitting: Internal Medicine

## 2024-04-07 ENCOUNTER — Encounter: Payer: Self-pay | Admitting: Internal Medicine

## 2024-04-07 VITALS — BP 120/70 | HR 70 | Resp 16 | Ht 64.0 in | Wt 145.0 lb

## 2024-04-07 DIAGNOSIS — E7800 Pure hypercholesterolemia, unspecified: Secondary | ICD-10-CM

## 2024-04-07 DIAGNOSIS — D692 Other nonthrombocytopenic purpura: Secondary | ICD-10-CM | POA: Diagnosis not present

## 2024-04-07 DIAGNOSIS — I10 Essential (primary) hypertension: Secondary | ICD-10-CM | POA: Diagnosis not present

## 2024-04-07 DIAGNOSIS — I712 Thoracic aortic aneurysm, without rupture, unspecified: Secondary | ICD-10-CM

## 2024-04-07 DIAGNOSIS — N1832 Chronic kidney disease, stage 3b: Secondary | ICD-10-CM | POA: Diagnosis not present

## 2024-04-07 DIAGNOSIS — I4891 Unspecified atrial fibrillation: Secondary | ICD-10-CM | POA: Diagnosis not present

## 2024-04-07 DIAGNOSIS — K219 Gastro-esophageal reflux disease without esophagitis: Secondary | ICD-10-CM | POA: Diagnosis not present

## 2024-04-07 NOTE — Progress Notes (Signed)
 Subjective:    Patient ID: Leah Huber, female    DOB: Sep 18, 1940, 83 y.o.   MRN: [id number]  Patient here for  Chief Complaint  Patient presents with   Medical Management of Chronic Issues    HPI Here as a work in to discuss her blood pressure. Daughter called in recently with concerns regarding low blood pressure. Reported increased fatigue. Was asked to hold losartan  and follow pressures. Seeing Dr Rennie for anemia - receiving iron  infusion. Reports home readings - 98/56, 96/56, 111/64. Brought in cuff from home. Reports some fatigue. No chest pain or sob reported. No abdominal pain or bowel change reported.    Past Medical History:  Diagnosis Date   A-fib [facility])    a.) CHA2DS2-VASc = 5 (age x2, sex, HTN, vascular disease) as of 10/08/2023; b.) scheduled for DCCV 10/29/220 - arrived in [place] - procedure cancelled; c.) s/p WACA/PVI ablation 05/21/2021; d.) cardiac rate/rhythm maintained on oral metoprolol succinate; chronically anticoagulated using apixaban   Adenomatous colon polyp    Aortic atherosclerosis (HCC)    Arthritis    Ascending aorta dilation (HCC)    a.) TTE 03/06/2016: asc Ao 40 mm, Ao root 32 mm; b.) TTE 10/13/2018: Ao root 32 mm; c.) TTE 02/04/2023: Ao root 28 mm, asc Ao 39 mm   Bilateral carotid artery disease (HCC)    Bilateral carpal tunnel syndrome 12/08/2017   Bilateral inguinal hernia    Cervical spondylosis with radiculopathy    a.) s/p ACDF C5-C6 2016   Chronic cough    @ NIGHT/ SEASONAL ALLERGIES   CKD (chronic kidney disease), stage III (HCC)    Clostridium difficile infection 2013-2014   Concussion    Coronary artery calcification seen on CT scan    Depression    Diastolic dysfunction 03/06/2016   a.) TTE 03/06/16: EF >55%, G1DD, RVSP 46.5, mild MR, mod TR, asc Ao 40 mm, Ao root 32 mm; b.) TTE 10/13/18: EF >55%, mild LVH, RVSP 35.1, mild MR/TR/PR, Ao root 32 mm; c.) TTE 08/19/20: EF >55%, mild RVE, mild BAE, G1DD, RVSP 42.4, triv AR/PR, mild  MR, mod TR; d.) TTE 02/04/2023: EF >55%, mild LAE, RVSP 54.8, PFO (prior transeptal puncture) with L-R IAS, mild MR, mod TR, Ao root 28 mm, asc Ao 39 mm   Diverticulosis    GERD (gastroesophageal reflux disease)    Glaucoma    Gout    Hair loss    a.) on minoxidil    Hemorrhoids    History of bilateral cataract extraction    History of chicken pox    History of colonic polyps    History of shingles    Hypercholesterolemia    Hypertension    IBS (irritable bowel syndrome)    IDA (iron  deficiency anemia)    On reduced dose apixaban therapy    Osteoarthritis    Osteopenia    Palpitations    Paresthesia of both hands    PFO (patent foramen ovale)    a.) TTE 02/04/2023: PFO (prior transeptal puncture) with L-R IAS   Pneumonia    Pulmonary HTN (HCC) 03/06/2016   a.) TTE 03/06/2016: RVSP 46.5; b.) TTE 10/13/2018: RVSP 35.1; c.) TTE 08/19/2020: RVSP 42.4; d.) TTE 02/04/2023: RVSP 54.8   Right leg paresthesias (lower leg and foot)    a.) following spinal surgery   Secondary hyperparathyroidism of renal origin (HCC)    Sleep apnea    a.) mild; does not require nocturnal PAP therapy   SOB (shortness of breath)  Urinary incontinence    Past Surgical History:  Procedure Laterality Date   ABDOMINAL HYSTERECTOMY  1979   ANTERIOR CERVICAL DECOMP/DISCECTOMY FUSION N/A 11/28/2014   Procedure: CERVICAL FIVE-SIX  ANTERIOR CERVICAL DECOMPRESSION/DISCECTOMY FUSION 1 LEVEL;  Surgeon: Reyes Budge, MD;  Location: [facility];  Service: Neurosurgery;  Laterality: N/A;  C56 anterior cervical decompression with fusion interbody prosthesis plating and bonegraft   BACK SURGERY     X2/ L4,5 & S1/ Cervical C7?   BLADDER SURGERY  1981   SUSPENSION   BREAST REDUCTION SURGERY     CARDIOVERSION N/A 10/29/2020   Procedure: CARDIOVERSION;  Surgeon: Hester Wolm PARAS, MD;  Location: [facility];  Service: Cardiovascular;  Laterality: N/A; CANCELLED AS PATIENT WAS IN SINUS RHYTHM   CARPAL TUNNEL RELEASE   2007   CATARACT EXTRACTION W/PHACO Right 12/30/2016   Procedure: CATARACT EXTRACTION PHACO AND INTRAOCULAR LENS PLACEMENT (IOC)  Right toric lens;  Surgeon: Dene Etienne, MD;  Location: [facility];  Service: Ophthalmology;  Laterality: Right   CATARACT EXTRACTION W/PHACO Left 01/20/2017   Procedure: CATARACT EXTRACTION PHACO AND INTRAOCULAR LENS PLACEMENT (IOC)  Left Toric;  Surgeon: Etienne Dene, MD;  Location: [facility];  Service: Ophthalmology;  Laterality: Left;  toric lens   CHOLECYSTECTOMY  1996   COLONOSCOPY     COLONOSCOPY WITH PROPOFOL  N/A 05/22/2019   Procedure: COLONOSCOPY WITH PROPOFOL ;  Surgeon: Toledo, Ladell POUR, MD;  Location: [facility];  Service: Gastroenterology;  Laterality: N/A;   COSMETIC SURGERY     EYE SURGERY     FOOT SURGERY  2005   JOINT REPLACEMENT     LAMINECTOMY  1983 & 1985   REDUCTION MAMMAPLASTY Bilateral 2001   ROTATOR CUFF REPAIR  2000 & 2004   left and right   SPINAL CORD STIMULATOR IMPLANT  03/1997   SPINAL CORD STIMULATOR REMOVAL  2007   SPINE SURGERY     Tendon repair right shoulder     TONSILLECTOMY     TOTAL KNEE ARTHROPLASTY  10/2011 & 09/13   left and right   Family History  Problem Relation Age of Onset   Heart disease Mother    Stroke Mother    Hypertension Mother    Hyperlipidemia Father    Heart disease Father    Heart disease Brother        2 brothers   Alzheimer's disease Brother    Heart disease Sister        pacemaker   Breast cancer Daughter 60   Hyperlipidemia Brother    Social History   Socioeconomic History   Marital status: Widowed    Spouse name: Not on file   Number of children: 2   Years of education: Not on file   Highest education level: 12th grade  Occupational History   Not on file  Tobacco Use   Smoking status: Former    Current packs/day: 0.00    Average packs/day: 1 pack/day for 20.0 years (20.0 ttl pk-yrs)    Types: Cigarettes    Start date: 09/07/1968    Quit  date: 09/07/1988    Years since quitting: 35.6   Smokeless tobacco: Never  Vaping Use   Vaping status: Never Used  Substance and Sexual Activity   Alcohol use: Yes    Alcohol/week: 2.0 standard drinks of alcohol    Types: 2 Glasses of wine per week    Comment: occasional   Drug use: No   Sexual activity: Never  Other Topics Concern  Not on file  Social History Narrative   She is widowed, has two daughters. [place]; quit smoking- in 1990s; social alcohol.    Social Drivers of Corporate investment banker Strain: Low Risk  (03/09/2024)   Overall Financial Resource Strain (CARDIA)    Difficulty of Paying Living Expenses: Not hard at all  Food Insecurity: No Food Insecurity (03/09/2024)   Hunger Vital Sign    Worried About Running Out of Food in the Last Year: Never true    Ran Out of Food in the Last Year: Never true  Transportation Needs: No Transportation Needs (03/09/2024)   PRAPARE - Administrator, Civil Service (Medical): No    Lack of Transportation (Non-Medical): No  Physical Activity: Insufficiently Active (03/09/2024)   Exercise Vital Sign    Days of Exercise per Week: 3 days    Minutes of Exercise per Session: 20 min  Stress: No Stress Concern Present (03/09/2024)   [employer] of Occupational Health - Occupational Stress Questionnaire    Feeling of Stress: Not at all  Social Connections: Moderately Integrated (03/09/2024)   Social Connection and Isolation Panel    Frequency of Communication with Friends and Family: More than three times a week    Frequency of Social Gatherings with Friends and Family: Three times a week    Attends Religious Services: More than 4 times per year    Active Member of Clubs or Organizations: Yes    Attends Banker Meetings: More than 4 times per year    Marital Status: Widowed     Review of Systems  Constitutional:  Positive for fatigue. Negative for appetite change and unexpected weight change.  HENT:  Negative  for congestion and sinus pressure.   Respiratory:  Negative for cough, chest tightness and shortness of breath.   Cardiovascular:  Negative for chest pain, palpitations and leg swelling.  Gastrointestinal:  Negative for abdominal pain, diarrhea, nausea and vomiting.  Genitourinary:  Negative for difficulty urinating and dysuria.  Musculoskeletal:  Negative for joint swelling and myalgias.  Skin:  Negative for color change and rash.  Neurological:  Negative for dizziness and headaches.  Psychiatric/Behavioral:  Negative for agitation and dysphoric mood.        Objective:     BP 120/70   Pulse 70   Resp 16   Ht 5' 4 (1.626 m)   Wt 145 lb (65.8 kg)   LMP 08/15/1978   SpO2 98%   BMI 24.89 kg/m  Wt Readings from Last 3 Encounters:  04/07/24 145 lb (65.8 kg)  03/22/24 145 lb (65.8 kg)  03/13/24 144 lb 12.8 oz (65.7 kg)    Physical Exam Vitals reviewed.  Constitutional:      General: She is not in acute distress.    Appearance: Normal appearance.  HENT:     Head: Normocephalic and atraumatic.     Right Ear: External ear normal.     Left Ear: External ear normal.     Mouth/Throat:     Pharynx: No oropharyngeal exudate or posterior oropharyngeal erythema.  Eyes:     General: No scleral icterus.       Right eye: No discharge.        Left eye: No discharge.     Conjunctiva/sclera: Conjunctivae normal.  Neck:     Thyroid : No thyromegaly.  Cardiovascular:     Rate and Rhythm: Normal rate and regular rhythm.  Pulmonary:     Effort: No respiratory distress.  Breath sounds: Normal breath sounds. No wheezing.  Abdominal:     General: Bowel sounds are normal.     Palpations: Abdomen is soft.     Tenderness: There is no abdominal tenderness.  Musculoskeletal:        General: No swelling or tenderness.     Cervical back: Neck supple. No tenderness.  Lymphadenopathy:     Cervical: No cervical adenopathy.  Skin:    Findings: No erythema or rash.  Neurological:      Mental Status: She is alert.  Psychiatric:        Mood and Affect: Mood normal.        Behavior: Behavior normal.         Outpatient Encounter Medications as of 04/07/2024  Medication Sig   azelastine  (ASTELIN ) 0.1 % nasal spray Place 1 spray into both nostrils 2 (two) times daily. Use in each nostril as directed   cholecalciferol (VITAMIN D3) 25 MCG (1000 UNIT) tablet Take 2,000 Units by mouth daily.   cyanocobalamin  1000 MCG tablet Take 1,000 mcg by mouth every other day.   ELIQUIS 2.5 MG TABS tablet Take 2.5 mg by mouth 2 (two) times daily.   finasteride  (PROSCAR ) 5 MG tablet TAKE 1 TABLET DAILY   fluticasone  (FLONASE ) 50 MCG/ACT nasal spray Place 1 spray into both nostrils daily as needed for allergies or rhinitis.   latanoprost (XALATAN) 0.005 % ophthalmic solution Place 1 drop into both eyes at bedtime.    Lifitegrast (XIIDRA) 5 % SOLN Place 1 drop into both eyes daily.   losartan  (COZAAR ) 25 MG tablet Take 25 mg by mouth at bedtime. (Patient not taking: Reported on 04/07/2024)   metoprolol succinate (TOPROL-XL) 25 MG 24 hr tablet Take 25 mg by mouth daily.   minoxidil  (LONITEN ) 2.5 MG tablet TAKE 1 TABLET DAILY   pantoprazole  (PROTONIX ) 20 MG tablet TAKE 1 TABLET EVERY OTHER DAY   rosuvastatin  (CRESTOR ) 10 MG tablet TAKE 1 TABLET DAILY   tolterodine  (DETROL  LA) 4 MG 24 hr capsule TAKE 1 CAPSULE DAILY   Facility-Administered Encounter Medications as of 04/07/2024  Medication   Tralokinumab -ldrm SOSY 300 mg     Lab Results  Component Value Date   WBC 6.6 03/22/2024   HGB 11.3 (L) 03/22/2024   HCT 34.0 (L) 03/22/2024   PLT 318 03/22/2024   GLUCOSE 128 (H) 03/22/2024   CHOL 170 03/13/2024   TRIG 66.0 03/13/2024   HDL 101.20 03/13/2024   LDLDIRECT 145.0 01/06/2021   LDLCALC 56 03/13/2024   ALT 8 03/13/2024   AST 15 03/13/2024   NA 134 (L) 03/22/2024   K 4.4 03/22/2024   CL 102 03/22/2024   CREATININE 1.32 (H) 03/22/2024   BUN 20 03/22/2024   CO2 22 03/22/2024   TSH  1.67 09/13/2023   INR 1.0 10/16/2013   HGBA1C 5.5 07/18/2019       Assessment & Plan:  Atrial fibrillation, unspecified type [facility]) Assessment & Plan: S/p ablation in September. On metoprolol. Continues on eliquis. Stable.    Thoracic aortic aneurysm without rupture, unspecified part [facility]) Assessment & Plan: Sees cardiology.    Stage 3b chronic kidney disease (HCC) Assessment & Plan: Avoid nephrotoxins.  On losartan . Follow pressures.  Follow metabolic panel. Continue f/u with nephrology.    Gastroesophageal reflux disease, unspecified whether esophagitis present Assessment & Plan: Continue protonix .    Hypercholesterolemia Assessment & Plan: Continue crestor . Low cholesterol diet and exercise. Follow lipid panel and liver function tests. No changes today.  Primary hypertension Assessment & Plan: Has noticed low blood pressures recently as outlined. Currently on metoprolol. Just recent stopped losartan . Blood pressure as outlined. 136/72 on my recheck. Remain off losartan . Continues on metoprolol. Follow pressures.    Other nonthrombocytopenic purpura (HCC) Assessment & Plan: Afib. On eliquis.       Allena Hamilton, MD

## 2024-04-09 ENCOUNTER — Encounter: Payer: Self-pay | Admitting: Internal Medicine

## 2024-04-09 NOTE — Assessment & Plan Note (Signed)
 Continue protonix

## 2024-04-09 NOTE — Assessment & Plan Note (Signed)
 Continue crestor . Low cholesterol diet and exercise. Follow lipid panel and liver function tests. No changes today.

## 2024-04-09 NOTE — Assessment & Plan Note (Signed)
 Sees cardiology.

## 2024-04-09 NOTE — Assessment & Plan Note (Signed)
 Avoid nephrotoxins.  On losartan. Follow pressures.  Follow metabolic panel.   Continue f/u with nephrology.

## 2024-04-09 NOTE — Assessment & Plan Note (Signed)
Afib.  On eliquis.  

## 2024-04-09 NOTE — Assessment & Plan Note (Signed)
 Has noticed low blood pressures recently as outlined. Currently on metoprolol. Just recent stopped losartan . Blood pressure as outlined. 136/72 on my recheck. Remain off losartan . Continues on metoprolol. Follow pressures.

## 2024-04-09 NOTE — Assessment & Plan Note (Signed)
 S/p ablation in September. On metoprolol. Continues on eliquis. Stable.

## 2024-04-21 ENCOUNTER — Ambulatory Visit: Admitting: Internal Medicine

## 2024-06-21 ENCOUNTER — Other Ambulatory Visit: Payer: Self-pay | Admitting: Dermatology

## 2024-06-21 DIAGNOSIS — L659 Nonscarring hair loss, unspecified: Secondary | ICD-10-CM

## 2024-06-23 ENCOUNTER — Ambulatory Visit

## 2024-06-23 ENCOUNTER — Ambulatory Visit: Admitting: Internal Medicine

## 2024-06-23 ENCOUNTER — Other Ambulatory Visit

## 2024-06-26 ENCOUNTER — Encounter: Admitting: Internal Medicine

## 2024-06-29 ENCOUNTER — Encounter: Payer: Self-pay | Admitting: Internal Medicine

## 2024-06-29 ENCOUNTER — Ambulatory Visit: Admitting: Internal Medicine

## 2024-06-29 VITALS — BP 130/74 | HR 77 | Temp 99.0°F | Ht 64.0 in | Wt 142.4 lb

## 2024-06-29 DIAGNOSIS — I712 Thoracic aortic aneurysm, without rupture, unspecified: Secondary | ICD-10-CM

## 2024-06-29 DIAGNOSIS — E785 Hyperlipidemia, unspecified: Secondary | ICD-10-CM | POA: Diagnosis not present

## 2024-06-29 DIAGNOSIS — E7800 Pure hypercholesterolemia, unspecified: Secondary | ICD-10-CM

## 2024-06-29 DIAGNOSIS — I10 Essential (primary) hypertension: Secondary | ICD-10-CM

## 2024-06-29 DIAGNOSIS — K219 Gastro-esophageal reflux disease without esophagitis: Secondary | ICD-10-CM | POA: Diagnosis not present

## 2024-06-29 DIAGNOSIS — Z0000 Encounter for general adult medical examination without abnormal findings: Secondary | ICD-10-CM

## 2024-06-29 DIAGNOSIS — I2510 Atherosclerotic heart disease of native coronary artery without angina pectoris: Secondary | ICD-10-CM | POA: Diagnosis not present

## 2024-06-29 DIAGNOSIS — I340 Nonrheumatic mitral (valve) insufficiency: Secondary | ICD-10-CM | POA: Diagnosis not present

## 2024-06-29 DIAGNOSIS — Z1231 Encounter for screening mammogram for malignant neoplasm of breast: Secondary | ICD-10-CM

## 2024-06-29 DIAGNOSIS — I517 Cardiomegaly: Secondary | ICD-10-CM | POA: Diagnosis not present

## 2024-06-29 DIAGNOSIS — I4891 Unspecified atrial fibrillation: Secondary | ICD-10-CM

## 2024-06-29 DIAGNOSIS — I77810 Thoracic aortic ectasia: Secondary | ICD-10-CM | POA: Diagnosis not present

## 2024-06-29 DIAGNOSIS — E611 Iron deficiency: Secondary | ICD-10-CM | POA: Diagnosis not present

## 2024-06-29 DIAGNOSIS — N1832 Chronic kidney disease, stage 3b: Secondary | ICD-10-CM

## 2024-06-29 DIAGNOSIS — N1831 Chronic kidney disease, stage 3a: Secondary | ICD-10-CM | POA: Diagnosis not present

## 2024-06-29 DIAGNOSIS — Z7901 Long term (current) use of anticoagulants: Secondary | ICD-10-CM | POA: Diagnosis not present

## 2024-06-29 DIAGNOSIS — I480 Paroxysmal atrial fibrillation: Secondary | ICD-10-CM | POA: Diagnosis not present

## 2024-06-29 DIAGNOSIS — I071 Rheumatic tricuspid insufficiency: Secondary | ICD-10-CM | POA: Diagnosis not present

## 2024-06-29 LAB — LIPID PANEL
Cholesterol: 195 mg/dL (ref 0–200)
HDL: 109.7 mg/dL (ref >39.00)
LDL Cholesterol: 69 mg/dL (ref 0–99)
NonHDL: 85.62
Total CHOL/HDL Ratio: 2
Triglycerides: 84 mg/dL (ref 0.0–149.0)
VLDL: 16.8 mg/dL (ref 0.0–40.0)

## 2024-06-29 LAB — BASIC METABOLIC PANEL WITH GFR
BUN: 21 mg/dL (ref 6–23)
CO2: 30 meq/L (ref 19–32)
Calcium: 9.5 mg/dL (ref 8.4–10.5)
Chloride: 101 meq/L (ref 96–112)
Creatinine, Ser: 1.07 mg/dL (ref 0.40–1.20)
GFR: 47.68 mL/min — ABNORMAL LOW (ref >60.00)
Glucose, Bld: 88 mg/dL (ref 70–99)
Potassium: 4.2 meq/L (ref 3.5–5.1)
Sodium: 140 meq/L (ref 135–145)

## 2024-06-29 LAB — HEPATIC FUNCTION PANEL
ALT: 10 U/L (ref 0–35)
AST: 19 U/L (ref 0–37)
Albumin: 4.6 g/dL (ref 3.5–5.2)
Alkaline Phosphatase: 57 U/L (ref 39–117)
Bilirubin, Direct: 0.1 mg/dL (ref 0.0–0.3)
Total Bilirubin: 0.7 mg/dL (ref 0.2–1.2)
Total Protein: 6.9 g/dL (ref 6.0–8.3)

## 2024-06-29 LAB — TSH: TSH: 1.16 u[IU]/mL (ref 0.35–5.50)

## 2024-06-29 NOTE — Assessment & Plan Note (Addendum)
 Physical today 06/29/24.   Mammogram 06/02/23 - Birads I.  Colonoscopy 05/22/19.  F/u mammogram scheduled for 08/07/24.

## 2024-06-29 NOTE — Patient Instructions (Signed)
 SABRA

## 2024-06-29 NOTE — Progress Notes (Signed)
 Subjective:    Patient ID: Leah Huber, female    DOB: Jan 29, 1941, 83 y.o.   MRN: [id number]   HPI With past history of afib, CKD, hypercholesterolemia and hypertension. She comes in today to follow up on these issues as well as for a complete physical exam. Seeing Dr Rennie for anemia - receiving iron  infusion. S/p ablation in September. On metoprolol and eliquis. Off losartan . Blood pressures at home averaging 120-130/<80. Trying to stay active. [facility]. No chest pain. Breathing stable. No abdominal pain reported. Has f/u with cardiology today. Discussed with her regarding f/u evaluation - aortic aneurysm. Plans to discuss with cardiology.    Past Medical History:  Diagnosis Date   A-fib [facility])    a.) CHA2DS2-VASc = 5 (age x2, sex, HTN, vascular disease) as of 10/08/2023; b.) scheduled for DCCV 10/29/220 - arrived in [place] - procedure cancelled; c.) s/p WACA/PVI ablation 05/21/2021; d.) cardiac rate/rhythm maintained on oral metoprolol succinate; chronically anticoagulated using apixaban   Adenomatous colon polyp    Aortic atherosclerosis    Arthritis    Ascending aorta dilation    a.) TTE 03/06/2016: asc Ao 40 mm, Ao root 32 mm; b.) TTE 10/13/2018: Ao root 32 mm; c.) TTE 02/04/2023: Ao root 28 mm, asc Ao 39 mm   Bilateral carotid artery disease    Bilateral carpal tunnel syndrome 12/08/2017   Bilateral inguinal hernia    Cervical spondylosis with radiculopathy    a.) s/p ACDF C5-C6 2016   Chronic cough    @ NIGHT/ SEASONAL ALLERGIES   CKD (chronic kidney disease), stage III (HCC)    Clostridium difficile infection 2013-2014   Concussion    Coronary artery calcification seen on CT scan    Depression    Diastolic dysfunction 03/06/2016   a.) TTE 03/06/16: EF >55%, G1DD, RVSP 46.5, mild MR, mod TR, asc Ao 40 mm, Ao root 32 mm; b.) TTE 10/13/18: EF >55%, mild LVH, RVSP 35.1, mild MR/TR/PR, Ao root 32 mm; c.) TTE 08/19/20: EF >55%, mild RVE, mild BAE, G1DD, RVSP  42.4, triv AR/PR, mild MR, mod TR; d.) TTE 02/04/2023: EF >55%, mild LAE, RVSP 54.8, PFO (prior transeptal puncture) with L-R IAS, mild MR, mod TR, Ao root 28 mm, asc Ao 39 mm   Diverticulosis    GERD (gastroesophageal reflux disease)    Glaucoma    Gout    Hair loss    a.) on minoxidil    Hemorrhoids    History of bilateral cataract extraction    History of chicken pox    History of colonic polyps    History of shingles    Hypercholesterolemia    Hypertension    IBS (irritable bowel syndrome)    IDA (iron  deficiency anemia)    On reduced dose apixaban therapy    Osteoarthritis    Osteopenia    Palpitations    Paresthesia of both hands    PFO (patent foramen ovale)    a.) TTE 02/04/2023: PFO (prior transeptal puncture) with L-R IAS   Pneumonia    Pulmonary HTN (HCC) 03/06/2016   a.) TTE 03/06/2016: RVSP 46.5; b.) TTE 10/13/2018: RVSP 35.1; c.) TTE 08/19/2020: RVSP 42.4; d.) TTE 02/04/2023: RVSP 54.8   Right leg paresthesias (lower leg and foot)    a.) following spinal surgery   Secondary hyperparathyroidism of renal origin    Sleep apnea    a.) mild; does not require nocturnal PAP therapy   SOB (shortness of breath)  Urinary incontinence    Past Surgical History:  Procedure Laterality Date   ABDOMINAL HYSTERECTOMY  1979   ANTERIOR CERVICAL DECOMP/DISCECTOMY FUSION N/A 11/28/2014   Procedure: CERVICAL FIVE-SIX  ANTERIOR CERVICAL DECOMPRESSION/DISCECTOMY FUSION 1 LEVEL;  Surgeon: Reyes Budge, MD;  Location: [facility];  Service: Neurosurgery;  Laterality: N/A;  C56 anterior cervical decompression with fusion interbody prosthesis plating and bonegraft   BACK SURGERY     X2/ L4,5 & S1/ Cervical C7?   BLADDER SURGERY  1981   SUSPENSION   BREAST REDUCTION SURGERY     CARDIOVERSION N/A 10/29/2020   Procedure: CARDIOVERSION;  Surgeon: Hester Wolm PARAS, MD;  Location: [facility];  Service: Cardiovascular;  Laterality: N/A; CANCELLED AS PATIENT WAS IN SINUS RHYTHM   CARPAL  TUNNEL RELEASE  2007   CATARACT EXTRACTION W/PHACO Right 12/30/2016   Procedure: CATARACT EXTRACTION PHACO AND INTRAOCULAR LENS PLACEMENT (IOC)  Right toric lens;  Surgeon: Dene Etienne, MD;  Location: [facility];  Service: Ophthalmology;  Laterality: Right   CATARACT EXTRACTION W/PHACO Left 01/20/2017   Procedure: CATARACT EXTRACTION PHACO AND INTRAOCULAR LENS PLACEMENT (IOC)  Left Toric;  Surgeon: Etienne Dene, MD;  Location: [facility];  Service: Ophthalmology;  Laterality: Left;  toric lens   CHOLECYSTECTOMY  1996   COLONOSCOPY     COLONOSCOPY WITH PROPOFOL  N/A 05/22/2019   Procedure: COLONOSCOPY WITH PROPOFOL ;  Surgeon: Toledo, Ladell POUR, MD;  Location: [facility];  Service: Gastroenterology;  Laterality: N/A;   COSMETIC SURGERY     EYE SURGERY     FOOT SURGERY  2005   JOINT REPLACEMENT     LAMINECTOMY  1983 & 1985   REDUCTION MAMMAPLASTY Bilateral 2001   ROTATOR CUFF REPAIR  2000 & 2004   left and right   SPINAL CORD STIMULATOR IMPLANT  03/1997   SPINAL CORD STIMULATOR REMOVAL  2007   SPINE SURGERY     Tendon repair right shoulder     TONSILLECTOMY     TOTAL KNEE ARTHROPLASTY  10/2011 & 09/13   left and right   Family History  Problem Relation Age of Onset   Heart disease Mother    Stroke Mother    Hypertension Mother    Hyperlipidemia Father    Heart disease Father    Heart disease Brother        2 brothers   Alzheimer's disease Brother    Heart disease Sister        pacemaker   Breast cancer Daughter 108   Hyperlipidemia Brother    Social History   Socioeconomic History   Marital status: Widowed    Spouse name: Not on file   Number of children: 2   Years of education: Not on file   Highest education level: 12th grade  Occupational History   Not on file  Tobacco Use   Smoking status: Former    Current packs/day: 0.00    Average packs/day: 1 pack/day for 20.0 years (20.0 ttl pk-yrs)    Types: Cigarettes    Start date:  09/07/1968    Quit date: 09/07/1988    Years since quitting: 35.8   Smokeless tobacco: Never  Vaping Use   Vaping status: Never Used  Substance and Sexual Activity   Alcohol use: Yes    Alcohol/week: 2.0 standard drinks of alcohol    Types: 2 Glasses of wine per week    Comment: occasional   Drug use: No   Sexual activity: Never  Other Topics Concern  Not on file  Social History Narrative   She is widowed, has two daughters. [place]; quit smoking- in 1990s; social alcohol.    Social Drivers of Corporate Investment Banker Strain: Low Risk  (06/25/2024)   Overall Financial Resource Strain (CARDIA)    Difficulty of Paying Living Expenses: Not hard at all  Food Insecurity: No Food Insecurity (06/25/2024)   Hunger Vital Sign    Worried About Running Out of Food in the Last Year: Never true    Ran Out of Food in the Last Year: Never true  Transportation Needs: No Transportation Needs (06/25/2024)   PRAPARE - Administrator, Civil Service (Medical): No    Lack of Transportation (Non-Medical): No  Physical Activity: Insufficiently Active (06/25/2024)   Exercise Vital Sign    Days of Exercise per Week: 3 days    Minutes of Exercise per Session: 20 min  Stress: Stress Concern Present (06/25/2024)   [employer] of Occupational Health - Occupational Stress Questionnaire    Feeling of Stress: To some extent  Social Connections: Moderately Integrated (06/25/2024)   Social Connection and Isolation Panel    Frequency of Communication with Friends and Family: More than three times a week    Frequency of Social Gatherings with Friends and Family: More than three times a week    Attends Religious Services: More than 4 times per year    Active Member of [employer] or Organizations: Yes    Attends Banker Meetings: More than 4 times per year    Marital Status: Widowed     Review of Systems  Constitutional:  Negative for appetite change and unexpected weight change.   HENT:  Negative for congestion, sinus pressure and sore throat.   Eyes:  Negative for pain and visual disturbance.  Respiratory:  Negative for cough, chest tightness and shortness of breath.   Cardiovascular:  Negative for chest pain, palpitations and leg swelling.  Gastrointestinal:  Negative for abdominal pain, diarrhea, nausea and vomiting.  Genitourinary:  Negative for difficulty urinating and dysuria.  Musculoskeletal:  Negative for joint swelling and myalgias.  Skin:  Negative for color change and wound.  Neurological:  Negative for dizziness and headaches.  Hematological:  Negative for adenopathy. Does not bruise/bleed easily.  Psychiatric/Behavioral:  Negative for agitation and dysphoric mood.        Objective:     BP 130/74 (Cuff Size: Small)   Pulse 77   Temp 99 F (37.2 C) (Oral)   Ht 5' 4 (1.626 m)   Wt 142 lb 6.4 oz (64.6 kg)   LMP 08/15/1978   SpO2 96%   BMI 24.44 kg/m  Wt Readings from Last 3 Encounters:  06/29/24 142 lb 6.4 oz (64.6 kg)  04/07/24 145 lb (65.8 kg)  03/22/24 145 lb (65.8 kg)    Physical Exam Vitals reviewed.  Constitutional:      General: She is not in acute distress.    Appearance: Normal appearance. She is well-developed.  HENT:     Head: Normocephalic and atraumatic.     Right Ear: External ear normal.     Left Ear: External ear normal.     Mouth/Throat:     Pharynx: No oropharyngeal exudate or posterior oropharyngeal erythema.  Eyes:     General: No scleral icterus.       Right eye: No discharge.        Left eye: No discharge.     Conjunctiva/sclera: Conjunctivae normal.  Neck:  Thyroid : No thyromegaly.  Cardiovascular:     Rate and Rhythm: Normal rate and regular rhythm.  Pulmonary:     Effort: No tachypnea, accessory muscle usage or respiratory distress.     Breath sounds: Normal breath sounds. No decreased breath sounds or wheezing.  Chest:  Breasts:    Right: No inverted nipple, mass, nipple discharge or tenderness  (no axillary adenopathy).     Left: No inverted nipple, mass, nipple discharge or tenderness (no axilarry adenopathy).  Abdominal:     General: Bowel sounds are normal.     Palpations: Abdomen is soft.     Tenderness: There is no abdominal tenderness.  Musculoskeletal:        General: No swelling or tenderness.     Cervical back: Neck supple.  Lymphadenopathy:     Cervical: No cervical adenopathy.  Skin:    Findings: No erythema or rash.  Neurological:     Mental Status: She is alert and oriented to person, place, and time.  Psychiatric:        Mood and Affect: Mood normal.        Behavior: Behavior normal.         Outpatient Encounter Medications as of 06/29/2024  Medication Sig   azelastine  (ASTELIN ) 0.1 % nasal spray Place 1 spray into both nostrils 2 (two) times daily. Use in each nostril as directed   cholecalciferol (VITAMIN D3) 25 MCG (1000 UNIT) tablet Take 2,000 Units by mouth daily.   cyanocobalamin  1000 MCG tablet Take 1,000 mcg by mouth every other day.   ELIQUIS 2.5 MG TABS tablet Take 2.5 mg by mouth 2 (two) times daily.   finasteride  (PROSCAR ) 5 MG tablet TAKE 1 TABLET DAILY   fluticasone  (FLONASE ) 50 MCG/ACT nasal spray Place 1 spray into both nostrils daily as needed for allergies or rhinitis.   latanoprost (XALATAN) 0.005 % ophthalmic solution Place 1 drop into both eyes at bedtime.    Lifitegrast (XIIDRA) 5 % SOLN Place 1 drop into both eyes daily.   metoprolol succinate (TOPROL-XL) 25 MG 24 hr tablet Take 25 mg by mouth daily.   minoxidil  (LONITEN ) 2.5 MG tablet TAKE 1 TABLET DAILY   pantoprazole  (PROTONIX ) 20 MG tablet TAKE 1 TABLET EVERY OTHER DAY   rosuvastatin  (CRESTOR ) 10 MG tablet TAKE 1 TABLET DAILY   tolterodine  (DETROL  LA) 4 MG 24 hr capsule TAKE 1 CAPSULE DAILY   [DISCONTINUED] losartan  (COZAAR ) 25 MG tablet Take 25 mg by mouth at bedtime. (Patient not taking: Reported on 06/29/2024)   Facility-Administered Encounter Medications as of 06/29/2024   Medication   Tralokinumab -ldrm SOSY 300 mg     Lab Results  Component Value Date   WBC 6.6 03/22/2024   HGB 11.3 (L) 03/22/2024   HCT 34.0 (L) 03/22/2024   PLT 318 03/22/2024   GLUCOSE 88 06/29/2024   CHOL 195 06/29/2024   TRIG 84.0 06/29/2024   HDL 109.70 06/29/2024   LDLDIRECT 145.0 01/06/2021   LDLCALC 69 06/29/2024   ALT 10 06/29/2024   AST 19 06/29/2024   NA 140 06/29/2024   K 4.2 06/29/2024   CL 101 06/29/2024   CREATININE 1.07 06/29/2024   BUN 21 06/29/2024   CO2 30 06/29/2024   TSH 1.16 06/29/2024   INR 1.0 10/16/2013   HGBA1C 5.5 07/18/2019       Assessment & Plan:  Primary hypertension Assessment & Plan: Off losartan . Blood pressure doing well. Remain off. Follow pressures. Continues on metoprolol.   Orders: -  Basic metabolic panel with GFR  Hypercholesterolemia Assessment & Plan: Continue crestor . Low cholesterol diet and exercise. Follow lipid panel and liver function tests. No change in cholesterol medication today.    Orders: -     Lipid panel -     Hepatic function panel -     TSH  Health care maintenance Assessment & Plan: Physical today 06/29/24.   Mammogram 06/02/23 - Birads I.  Colonoscopy 05/22/19.  F/u mammogram scheduled for 08/07/24.    Encounter for screening mammogram for malignant neoplasm of breast -     3D Screening Mammogram, Left and Right; Future  Iron  deficiency Assessment & Plan: Seeing Dr Rennie - receiving iron  infusions. Continues on oral B12 every other day. Had f/u with GI 04/01/23. Recommended continuing protonix .. continue f/u with hematology.    Gastroesophageal reflux disease, unspecified whether esophagitis present Assessment & Plan: No upper symptoms. On protonix .    Stage 3b chronic kidney disease (HCC) Assessment & Plan: Avoid nephrotoxins.  Follow pressures.  Off losartan . Follow metabolic panel. Continue f/u with nephrology.    Thoracic aortic aneurysm without rupture, unspecified  part Assessment & Plan: Seeing cardiology today. Plans to discuss f/u.    Atrial fibrillation, unspecified type [facility]) Assessment & Plan: S/p ablation in September. On metoprolol. Continues on eliquis. Stable.       Allena Hamilton, MD

## 2024-06-30 ENCOUNTER — Ambulatory Visit: Payer: Self-pay | Admitting: Internal Medicine

## 2024-07-02 ENCOUNTER — Encounter: Payer: Self-pay | Admitting: Internal Medicine

## 2024-07-02 NOTE — Assessment & Plan Note (Signed)
 Seeing Dr Rennie - receiving iron  infusions. Continues on oral B12 every other day. Had f/u with GI 04/01/23. Recommended continuing protonix .. continue f/u with hematology.

## 2024-07-02 NOTE — Assessment & Plan Note (Signed)
No upper symptoms.  On protonix.   

## 2024-07-02 NOTE — Assessment & Plan Note (Signed)
 Seeing cardiology today. Plans to discuss f/u.

## 2024-07-02 NOTE — Assessment & Plan Note (Signed)
 Off losartan . Blood pressure doing well. Remain off. Follow pressures. Continues on metoprolol.

## 2024-07-02 NOTE — Assessment & Plan Note (Signed)
 Avoid nephrotoxins.  Follow pressures.  Off losartan . Follow metabolic panel. Continue f/u with nephrology.

## 2024-07-02 NOTE — Assessment & Plan Note (Signed)
 Continue crestor . Low cholesterol diet and exercise. Follow lipid panel and liver function tests. No change in cholesterol medication today.

## 2024-07-02 NOTE — Assessment & Plan Note (Signed)
 S/p ablation in September. On metoprolol. Continues on eliquis. Stable.

## 2024-07-12 DIAGNOSIS — I480 Paroxysmal atrial fibrillation: Secondary | ICD-10-CM | POA: Diagnosis not present

## 2024-07-17 ENCOUNTER — Inpatient Hospital Stay: Attending: Internal Medicine

## 2024-07-17 ENCOUNTER — Encounter: Payer: Self-pay | Admitting: Internal Medicine

## 2024-07-17 ENCOUNTER — Inpatient Hospital Stay: Admitting: Internal Medicine

## 2024-07-17 ENCOUNTER — Inpatient Hospital Stay

## 2024-07-17 VITALS — BP 141/78 | HR 74 | Temp 97.6°F | Resp 20 | Wt 142.0 lb

## 2024-07-17 VITALS — BP 123/60 | HR 64

## 2024-07-17 DIAGNOSIS — I129 Hypertensive chronic kidney disease with stage 1 through stage 4 chronic kidney disease, or unspecified chronic kidney disease: Secondary | ICD-10-CM | POA: Diagnosis not present

## 2024-07-17 DIAGNOSIS — N183 Chronic kidney disease, stage 3 unspecified: Secondary | ICD-10-CM | POA: Diagnosis not present

## 2024-07-17 DIAGNOSIS — D649 Anemia, unspecified: Secondary | ICD-10-CM

## 2024-07-17 DIAGNOSIS — D509 Iron deficiency anemia, unspecified: Secondary | ICD-10-CM | POA: Diagnosis not present

## 2024-07-17 DIAGNOSIS — E611 Iron deficiency: Secondary | ICD-10-CM

## 2024-07-17 LAB — CBC WITH DIFFERENTIAL (CANCER CENTER ONLY)
Abs Immature Granulocytes: 0.03 K/uL (ref 0.00–0.07)
Basophils Absolute: 0.1 K/uL (ref 0.0–0.1)
Basophils Relative: 1 %
Eosinophils Absolute: 0.1 K/uL (ref 0.0–0.5)
Eosinophils Relative: 2 %
HCT: 36.3 % (ref 36.0–46.0)
Hemoglobin: 12.1 g/dL (ref 12.0–15.0)
Immature Granulocytes: 1 %
Lymphocytes Relative: 34 %
Lymphs Abs: 2 K/uL (ref 0.7–4.0)
MCH: 33.7 pg (ref 26.0–34.0)
MCHC: 33.3 g/dL (ref 30.0–36.0)
MCV: 101.1 fL — ABNORMAL HIGH (ref 80.0–100.0)
Monocytes Absolute: 0.5 K/uL (ref 0.1–1.0)
Monocytes Relative: 8 %
Neutro Abs: 3.3 K/uL (ref 1.7–7.7)
Neutrophils Relative %: 54 %
Platelet Count: 274 K/uL (ref 150–400)
RBC: 3.59 MIL/uL — ABNORMAL LOW (ref 3.87–5.11)
RDW: 13.1 % (ref 11.5–15.5)
WBC Count: 5.9 K/uL (ref 4.0–10.5)
nRBC: 0 % (ref 0.0–0.2)

## 2024-07-17 LAB — BASIC METABOLIC PANEL WITH GFR
Anion gap: 10 (ref 5–15)
BUN: 19 mg/dL (ref 8–23)
CO2: 24 mmol/L (ref 22–32)
Calcium: 9.4 mg/dL (ref 8.9–10.3)
Chloride: 103 mmol/L (ref 98–111)
Creatinine, Ser: 1.08 mg/dL — ABNORMAL HIGH (ref 0.44–1.00)
GFR, Estimated: 51 mL/min — ABNORMAL LOW (ref >60)
Glucose, Bld: 93 mg/dL (ref 70–99)
Potassium: 4.1 mmol/L (ref 3.5–5.1)
Sodium: 137 mmol/L (ref 135–145)

## 2024-07-17 LAB — IRON AND TIBC
Iron: 67 ug/dL (ref 28–170)
Saturation Ratios: 19 % (ref 10.4–31.8)
TIBC: 356 ug/dL (ref 250–450)
UIBC: 289 ug/dL

## 2024-07-17 LAB — FERRITIN: Ferritin: 83 ng/mL (ref 11–307)

## 2024-07-17 LAB — LACTATE DEHYDROGENASE: LDH: 197 U/L — ABNORMAL HIGH (ref 98–192)

## 2024-07-17 LAB — VITAMIN B12: Vitamin B-12: 944 pg/mL — ABNORMAL HIGH (ref 180–914)

## 2024-07-17 MED ORDER — IRON SUCROSE 20 MG/ML IV SOLN
200.0000 mg | Freq: Once | INTRAVENOUS | Status: AC
  Administered 2024-07-17: 200 mg via INTRAVENOUS
  Filled 2024-07-17: qty 10

## 2024-07-17 NOTE — Progress Notes (Signed)
 Patient has no concerns today.

## 2024-07-17 NOTE — Patient Instructions (Signed)

## 2024-07-17 NOTE — Progress Notes (Signed)
 Declined post-observation. Aware of risks. Vitals stable at discharge.

## 2024-07-17 NOTE — Progress Notes (Signed)
 [facility] CONSULT NOTE  Patient Care Team: Glendia Shad, MD as PCP - General (Internal Medicine) Aundria Ladell POUR, MD as Consulting Physician (Gastroenterology) Rennie Cindy SAUNDERS, MD as Consulting Physician (Oncology)  CHIEF COMPLAINTS/PURPOSE OF CONSULTATION: ANEMIA  HEMATOLOGY HISTORY:  # AUG 2022:  SEVERE ANEMIA [hb-7; ]  EGD/Colonoscopy:2020- last colo-73mm [facility]; Dr.Toledo]; Esophagogram- 2022- NEG for stricture. ? capsule  # CKD stage III [Dr.Korrapti]; A.fib [on eliquis on 2.5 mg BID;  Dr.Kowalski]   HISTORY OF PRESENTING ILLNESS: Alone. Ambulating independently.  Leah Huber 83 y.o.  female iron  deficient/macrocytic anemia of unclear etiology- / CKD-III; A-fib on Eliquis is here for follow-up.   Discussed the use of AI scribe software for clinical note transcription with the patient, who gave verbal consent to proceed.  History of Present Illness   Leah Huber is an 83 year old female with atrial fibrillation who presents with shortness of breath and for iron  infusion.  She experiences significant shortness of breath with minimal exertion, such as walking around the house, which she attributes to her atrial fibrillation. An echocardiogram was performed last week, which she describes as 'pretty good'.  Her hemoglobin level is currently 12.1, an improvement from previous levels in the elevens and tens. She denies any blood in her stools or black tarry stools. She is awaiting results for her iron  levels.  She has a history of not tolerating oral iron  supplements due to gastrointestinal upset. She recalls a previous infusion where the site became swollen and spread out by the next morning, which she documented with a photograph on her daughter's advice. She applied ice to the area.      No visible blood in stools. Continues to have shortness of breath on exertion.   Review of Systems  Constitutional:  Positive for malaise/fatigue. Negative for  chills, diaphoresis, fever and weight loss.  HENT:  Negative for nosebleeds and sore throat.   Eyes:  Negative for double vision.  Respiratory:  Negative for cough, hemoptysis, sputum production and wheezing.   Cardiovascular:  Negative for chest pain, palpitations, orthopnea and leg swelling.  Gastrointestinal:  Positive for constipation. Negative for blood in stool, heartburn, melena and vomiting.  Genitourinary:  Negative for dysuria, frequency and urgency.  Musculoskeletal:  Positive for back pain and joint pain.  Skin: Negative.  Negative for itching and rash.  Neurological:  Negative for tingling, focal weakness, weakness and headaches.  Endo/Heme/Allergies:  Does not bruise/bleed easily.  Psychiatric/Behavioral:  Negative for depression. The patient is not nervous/anxious and does not have insomnia.     MEDICAL HISTORY:  Past Medical History:  Diagnosis Date   A-fib [facility])    a.) CHA2DS2-VASc = 5 (age x2, sex, HTN, vascular disease) as of 10/08/2023; b.) scheduled for DCCV 10/29/220 - arrived in [place] - procedure cancelled; c.) s/p WACA/PVI ablation 05/21/2021; d.) cardiac rate/rhythm maintained on oral metoprolol succinate; chronically anticoagulated using apixaban   Adenomatous colon polyp    Aortic atherosclerosis    Arthritis    Ascending aorta dilation    a.) TTE 03/06/2016: asc Ao 40 mm, Ao root 32 mm; b.) TTE 10/13/2018: Ao root 32 mm; c.) TTE 02/04/2023: Ao root 28 mm, asc Ao 39 mm   Bilateral carotid artery disease    Bilateral carpal tunnel syndrome 12/08/2017   Bilateral inguinal hernia    Cervical spondylosis with radiculopathy    a.) s/p ACDF C5-C6 2016   Chronic cough    @ NIGHT/ SEASONAL ALLERGIES  CKD (chronic kidney disease), stage III (HCC)    Clostridium difficile infection 2013-2014   Concussion    Coronary artery calcification seen on CT scan    Depression    Diastolic dysfunction 03/06/2016   a.) TTE 03/06/16: EF >55%, G1DD, RVSP 46.5, mild MR, mod TR,  asc Ao 40 mm, Ao root 32 mm; b.) TTE 10/13/18: EF >55%, mild LVH, RVSP 35.1, mild MR/TR/PR, Ao root 32 mm; c.) TTE 08/19/20: EF >55%, mild RVE, mild BAE, G1DD, RVSP 42.4, triv AR/PR, mild MR, mod TR; d.) TTE 02/04/2023: EF >55%, mild LAE, RVSP 54.8, PFO (prior transeptal puncture) with L-R IAS, mild MR, mod TR, Ao root 28 mm, asc Ao 39 mm   Diverticulosis    GERD (gastroesophageal reflux disease)    Glaucoma    Gout    Hair loss    a.) on minoxidil    Hemorrhoids    History of bilateral cataract extraction    History of chicken pox    History of colonic polyps    History of shingles    Hypercholesterolemia    Hypertension    IBS (irritable bowel syndrome)    IDA (iron  deficiency anemia)    On reduced dose apixaban therapy    Osteoarthritis    Osteopenia    Palpitations    Paresthesia of both hands    PFO (patent foramen ovale)    a.) TTE 02/04/2023: PFO (prior transeptal puncture) with L-R IAS   Pneumonia    Pulmonary HTN (HCC) 03/06/2016   a.) TTE 03/06/2016: RVSP 46.5; b.) TTE 10/13/2018: RVSP 35.1; c.) TTE 08/19/2020: RVSP 42.4; d.) TTE 02/04/2023: RVSP 54.8   Right leg paresthesias (lower leg and foot)    a.) following spinal surgery   Secondary hyperparathyroidism of renal origin    Sleep apnea    a.) mild; does not require nocturnal PAP therapy   SOB (shortness of breath)    Urinary incontinence     SURGICAL HISTORY: Past Surgical History:  Procedure Laterality Date   ABDOMINAL HYSTERECTOMY  1979   ANTERIOR CERVICAL DECOMP/DISCECTOMY FUSION N/A 11/28/2014   Procedure: CERVICAL FIVE-SIX  ANTERIOR CERVICAL DECOMPRESSION/DISCECTOMY FUSION 1 LEVEL;  Surgeon: Reyes Budge, MD;  Location: [facility];  Service: Neurosurgery;  Laterality: N/A;  C56 anterior cervical decompression with fusion interbody prosthesis plating and bonegraft   BACK SURGERY     X2/ L4,5 & S1/ Cervical C7?   BLADDER SURGERY  1981   SUSPENSION   BREAST REDUCTION SURGERY     CARDIOVERSION N/A  10/29/2020   Procedure: CARDIOVERSION;  Surgeon: Hester Wolm PARAS, MD;  Location: [facility] ORS;  Service: Cardiovascular;  Laterality: N/A; CANCELLED AS PATIENT WAS IN SINUS RHYTHM   CARPAL TUNNEL RELEASE  2007   CATARACT EXTRACTION W/PHACO Right 12/30/2016   Procedure: CATARACT EXTRACTION PHACO AND INTRAOCULAR LENS PLACEMENT (IOC)  Right toric lens;  Surgeon: Dene Etienne, MD;  Location: [facility];  Service: Ophthalmology;  Laterality: Right   CATARACT EXTRACTION W/PHACO Left 01/20/2017   Procedure: CATARACT EXTRACTION PHACO AND INTRAOCULAR LENS PLACEMENT (IOC)  Left Toric;  Surgeon: Etienne Dene, MD;  Location: [facility];  Service: Ophthalmology;  Laterality: Left;  toric lens   CHOLECYSTECTOMY  1996   COLONOSCOPY     COLONOSCOPY WITH PROPOFOL  N/A 05/22/2019   Procedure: COLONOSCOPY WITH PROPOFOL ;  Surgeon: Toledo, Ladell POUR, MD;  Location: [facility];  Service: Gastroenterology;  Laterality: N/A;   COSMETIC SURGERY     EYE SURGERY  FOOT SURGERY  2005   JOINT REPLACEMENT     LAMINECTOMY  1983 & 1985   REDUCTION MAMMAPLASTY Bilateral 2001   ROTATOR CUFF REPAIR  2000 & 2004   left and right   SPINAL CORD STIMULATOR IMPLANT  03/1997   SPINAL CORD STIMULATOR REMOVAL  2007   SPINE SURGERY     Tendon repair right shoulder     TONSILLECTOMY     TOTAL KNEE ARTHROPLASTY  10/2011 & 09/13   left and right    SOCIAL HISTORY: Social History   Socioeconomic History   Marital status: Widowed    Spouse name: Not on file   Number of children: 2   Years of education: Not on file   Highest education level: 12th grade  Occupational History   Not on file  Tobacco Use   Smoking status: Former    Current packs/day: 0.00    Average packs/day: 1 pack/day for 20.0 years (20.0 ttl pk-yrs)    Types: Cigarettes    Start date: 09/07/1968    Quit date: 09/07/1988    Years since quitting: 35.8   Smokeless tobacco: Never  Vaping Use   Vaping status: Never Used   Substance and Sexual Activity   Alcohol use: Yes    Alcohol/week: 2.0 standard drinks of alcohol    Types: 2 Glasses of wine per week    Comment: occasional   Drug use: No   Sexual activity: Never  Other Topics Concern   Not on file  Social History Narrative   She is widowed, has two daughters. [place]; quit smoking- in 1990s; social alcohol.    Social Drivers of Corporate Investment Banker Strain: Low Risk  (06/25/2024)   Overall Financial Resource Strain (CARDIA)    Difficulty of Paying Living Expenses: Not hard at all  Food Insecurity: No Food Insecurity (06/25/2024)   Hunger Vital Sign    Worried About Running Out of Food in the Last Year: Never true    Ran Out of Food in the Last Year: Never true  Transportation Needs: No Transportation Needs (06/25/2024)   PRAPARE - Administrator, Civil Service (Medical): No    Lack of Transportation (Non-Medical): No  Physical Activity: Insufficiently Active (06/25/2024)   Exercise Vital Sign    Days of Exercise per Week: 3 days    Minutes of Exercise per Session: 20 min  Stress: Stress Concern Present (06/25/2024)   [employer] of Occupational Health - Occupational Stress Questionnaire    Feeling of Stress: To some extent  Social Connections: Moderately Integrated (06/25/2024)   Social Connection and Isolation Panel    Frequency of Communication with Friends and Family: More than three times a week    Frequency of Social Gatherings with Friends and Family: More than three times a week    Attends Religious Services: More than 4 times per year    Active Member of [employer] or Organizations: Yes    Attends Banker Meetings: More than 4 times per year    Marital Status: Widowed  Intimate Partner Violence: Not At Risk (10/06/2023)   Humiliation, Afraid, Rape, and Kick questionnaire    Fear of Current or Ex-Partner: No    Emotionally Abused: No    Physically Abused: No    Sexually Abused: No    FAMILY  HISTORY: Family History  Problem Relation Age of Onset   Heart disease Mother    Stroke Mother    Hypertension Mother  Hyperlipidemia Father    Heart disease Father    Heart disease Brother        2 brothers   Alzheimer's disease Brother    Heart disease Sister        pacemaker   Breast cancer Daughter 43   Hyperlipidemia Brother     ALLERGIES:  is allergic to clindamycin/lincomycin, dronedarone, bactrim [sulfamethoxazole-trimethoprim], morphine , morphine  and codeine, and sulfa antibiotics.  MEDICATIONS:  Current Outpatient Medications  Medication Sig Dispense Refill   azelastine  (ASTELIN ) 0.1 % nasal spray Place 1 spray into both nostrils 2 (two) times daily. Use in each nostril as directed 30 mL 0   cholecalciferol (VITAMIN D3) 25 MCG (1000 UNIT) tablet Take 2,000 Units by mouth daily.     cyanocobalamin  1000 MCG tablet Take 1,000 mcg by mouth every other day.     ELIQUIS 2.5 MG TABS tablet Take 2.5 mg by mouth 2 (two) times daily.     finasteride  (PROSCAR ) 5 MG tablet TAKE 1 TABLET DAILY 90 tablet 3   fluticasone  (FLONASE ) 50 MCG/ACT nasal spray Place 1 spray into both nostrils daily as needed for allergies or rhinitis.     latanoprost (XALATAN) 0.005 % ophthalmic solution Place 1 drop into both eyes at bedtime.      Lifitegrast (XIIDRA) 5 % SOLN Place 1 drop into both eyes daily.     metoprolol succinate (TOPROL-XL) 25 MG 24 hr tablet Take 25 mg by mouth daily.     minoxidil  (LONITEN ) 2.5 MG tablet TAKE 1 TABLET DAILY 90 tablet 0   pantoprazole  (PROTONIX ) 20 MG tablet TAKE 1 TABLET EVERY OTHER DAY 45 tablet 3   rosuvastatin  (CRESTOR ) 10 MG tablet TAKE 1 TABLET DAILY 90 tablet 3   tolterodine  (DETROL  LA) 4 MG 24 hr capsule TAKE 1 CAPSULE DAILY 90 capsule 1   Current Facility-Administered Medications  Medication Dose Route Frequency Provider Last Rate Last Admin   Tralokinumab -ldrm SOSY 300 mg  300 mg Subcutaneous Once Jackquline Sawyer, MD          PHYSICAL  EXAMINATION:   Vitals:   07/17/24 1005  BP: (!) 141/78  Pulse: 74  Resp: 20  Temp: 97.6 F (36.4 C)  SpO2: 100%    Filed Weights   07/17/24 1005  Weight: 142 lb (64.4 kg)      Physical Exam Vitals and nursing note reviewed.  HENT:     Head: Normocephalic and atraumatic.     Mouth/Throat:     Pharynx: Oropharynx is clear.  Eyes:     Extraocular Movements: Extraocular movements intact.     Pupils: Pupils are equal, round, and reactive to light.  Cardiovascular:     Rate and Rhythm: Normal rate. Rhythm irregular.  Pulmonary:     Comments: Decreased breath sounds bilaterally.  Abdominal:     Palpations: Abdomen is soft.  Musculoskeletal:        General: Normal range of motion.     Cervical back: Normal range of motion.  Skin:    General: Skin is warm.  Neurological:     General: No focal deficit present.     Mental Status: She is alert and oriented to person, place, and time.  Psychiatric:        Behavior: Behavior normal.        Judgment: Judgment normal.    LABORATORY DATA:  I have reviewed the data as listed Lab Results  Component Value Date   WBC 5.9 07/17/2024   HGB 12.1 07/17/2024  HCT 36.3 07/17/2024   MCV 101.1 (H) 07/17/2024   PLT 274 07/17/2024   Recent Labs    12/20/23 1542 12/21/23 1249 03/13/24 1000 03/22/24 1231 06/29/24 1244 07/17/24 0938  NA 138 134* 137 134* 140 137  K 4.3 4.4 4.2 4.4 4.2 4.1  CL 105 102 104 102 101 103  CO2 24 20* 25 22 30 24   GLUCOSE 93 94 93 128* 88 93  BUN 20 21 19 20 21 19   CREATININE 1.25* 1.25* 1.19 1.32* 1.07 1.08*  CALCIUM  9.5 9.1 9.7 9.4 9.5 9.4  GFRNONAA  --  43*  --  40*  --  51*  PROT 7.0  --  7.2  --  6.9  --   ALBUMIN 4.7  --  4.4  --  4.6  --   AST 20  --  15  --  19  --   ALT 11  --  8  --  10  --   ALKPHOS 57  --  53  --  57  --   BILITOT 0.6  --  0.9  --  0.7  --   BILIDIR 0.1  --  0.2  --  0.1  --      No results found.  Symptomatic anemia #Anemia-iron  deficiency; - nadir July  2022- Hemoglobin improved from 7 to 12.  INTOLERANCE PO gentle iron / constipation [needing enema].  Proceed with iron  infusion today-  stable.  # Macrocytosis- ? unclear etiology- B12 JAN 2024- > 1000; recommend qOD on b12 pills;  / CKD vs others.  Discussed re: bone marrow dysfunction, but HOLD off  bone marrow biopsy at his time given response to IV iron .  Etiology of iron  deficiency: [Oct 2022] s/p [facility] evaluation; s/p  barium esophagogram.  #A. fib [Dr.Thomas; [facility] ]-on Eliquis 2.5 mg twice daily- s/p ablation [[facility]-Oct 20[facility]]- stable   # CKD- Stage III- GFR 46- stable. [Dr.korrapati]  # DISPOSITION: # venofer  today;  # follow up in 4 month-  MD; labs- cbc/bmp/iron  studies/ferritin; LDH;B12 level- Possible venofer -Dr.B      All questions were answered. The patient knows to call the clinic with any problems, questions or concerns.    Cindy JONELLE Joe, MD 07/17/2024 1:28 PM

## 2024-07-17 NOTE — Assessment & Plan Note (Addendum)
#  Anemia-iron  deficiency; - nadir July 2022- Hemoglobin improved from 7 to 12.  INTOLERANCE PO gentle iron / constipation [needing enema].  Proceed with iron  infusion today-  stable.  # Macrocytosis- ? unclear etiology- B12 JAN 2024- > 1000; recommend qOD on b12 pills;  / CKD vs others.  Discussed re: bone marrow dysfunction, but HOLD off  bone marrow biopsy at his time given response to IV iron .  Etiology of iron  deficiency: [Oct 2022] s/p [facility] evaluation; s/p  barium esophagogram.  #A. fib [Dr.Thomas; [facility] ]-on Eliquis 2.5 mg twice daily- s/p ablation [[facility]]- stable   # CKD- Stage III- GFR 46- stable. [Dr.korrapati]  # DISPOSITION: # venofer  today;  # follow up in 4 month-  MD; labs- cbc/bmp/iron  studies/ferritin; LDH;B12 level- Possible venofer -Dr.B

## 2024-07-18 ENCOUNTER — Other Ambulatory Visit: Payer: Self-pay | Admitting: *Deleted

## 2024-07-18 MED ORDER — AZELASTINE HCL 0.1 % NA SOLN: 1.0000 | Freq: Two times a day (BID) | NASAL | 3 refills | Status: AC

## 2024-07-28 ENCOUNTER — Other Ambulatory Visit: Payer: Self-pay | Admitting: Dermatology

## 2024-07-28 DIAGNOSIS — L659 Nonscarring hair loss, unspecified: Secondary | ICD-10-CM

## 2024-07-31 NOTE — Telephone Encounter (Signed)
 Patient needs appointment for further refills

## 2024-08-07 ENCOUNTER — Encounter

## 2024-08-28 ENCOUNTER — Other Ambulatory Visit: Payer: Self-pay | Admitting: Internal Medicine

## 2024-09-04 ENCOUNTER — Other Ambulatory Visit: Payer: Self-pay | Admitting: Internal Medicine

## 2024-09-04 DIAGNOSIS — E7800 Pure hypercholesterolemia, unspecified: Secondary | ICD-10-CM

## 2024-09-07 ENCOUNTER — Encounter: Payer: Self-pay | Admitting: Internal Medicine

## 2024-09-14 ENCOUNTER — Encounter

## 2024-09-18 ENCOUNTER — Other Ambulatory Visit: Payer: Self-pay | Admitting: Dermatology

## 2024-09-18 DIAGNOSIS — L659 Nonscarring hair loss, unspecified: Secondary | ICD-10-CM

## 2024-10-05 ENCOUNTER — Other Ambulatory Visit: Payer: Self-pay | Admitting: Internal Medicine

## 2024-10-05 ENCOUNTER — Encounter

## 2024-10-11 ENCOUNTER — Ambulatory Visit: Payer: Medicare Other

## 2024-10-13 ENCOUNTER — Encounter

## 2024-10-30 ENCOUNTER — Encounter

## 2024-10-30 ENCOUNTER — Ambulatory Visit: Admitting: Internal Medicine

## 2024-11-14 ENCOUNTER — Inpatient Hospital Stay

## 2024-11-14 ENCOUNTER — Inpatient Hospital Stay: Admitting: Internal Medicine

## 2024-12-26 ENCOUNTER — Ambulatory Visit
